# Patient Record
Sex: Female | Born: 1940 | Race: White | Hispanic: No | Marital: Married | Smoking: Never smoker
Health system: Southern US, Community
[De-identification: ages and names within clinical notes are randomized; demographics above are authoritative.]

## PROBLEM LIST (undated history)

## (undated) DIAGNOSIS — N183 Chronic kidney disease, stage 3 unspecified: Secondary | ICD-10-CM

## (undated) DIAGNOSIS — I639 Cerebral infarction, unspecified: Secondary | ICD-10-CM

## (undated) DIAGNOSIS — M1990 Unspecified osteoarthritis, unspecified site: Secondary | ICD-10-CM

## (undated) DIAGNOSIS — I00 Rheumatic fever without heart involvement: Secondary | ICD-10-CM

## (undated) DIAGNOSIS — L03119 Cellulitis of unspecified part of limb: Secondary | ICD-10-CM

## (undated) DIAGNOSIS — K759 Inflammatory liver disease, unspecified: Secondary | ICD-10-CM

## (undated) DIAGNOSIS — K219 Gastro-esophageal reflux disease without esophagitis: Secondary | ICD-10-CM

## (undated) DIAGNOSIS — E039 Hypothyroidism, unspecified: Secondary | ICD-10-CM

## (undated) DIAGNOSIS — I071 Rheumatic tricuspid insufficiency: Secondary | ICD-10-CM

## (undated) DIAGNOSIS — M797 Fibromyalgia: Secondary | ICD-10-CM

## (undated) DIAGNOSIS — D510 Vitamin B12 deficiency anemia due to intrinsic factor deficiency: Secondary | ICD-10-CM

## (undated) DIAGNOSIS — I2510 Atherosclerotic heart disease of native coronary artery without angina pectoris: Secondary | ICD-10-CM

## (undated) DIAGNOSIS — K254 Chronic or unspecified gastric ulcer with hemorrhage: Secondary | ICD-10-CM

## (undated) DIAGNOSIS — L02419 Cutaneous abscess of limb, unspecified: Secondary | ICD-10-CM

## (undated) DIAGNOSIS — I340 Nonrheumatic mitral (valve) insufficiency: Secondary | ICD-10-CM

## (undated) DIAGNOSIS — Z8679 Personal history of other diseases of the circulatory system: Secondary | ICD-10-CM

## (undated) DIAGNOSIS — J189 Pneumonia, unspecified organism: Secondary | ICD-10-CM

## (undated) DIAGNOSIS — Z95810 Presence of automatic (implantable) cardiac defibrillator: Secondary | ICD-10-CM

## (undated) DIAGNOSIS — I272 Pulmonary hypertension, unspecified: Secondary | ICD-10-CM

## (undated) DIAGNOSIS — C50919 Malignant neoplasm of unspecified site of unspecified female breast: Secondary | ICD-10-CM

## (undated) DIAGNOSIS — I4891 Unspecified atrial fibrillation: Secondary | ICD-10-CM

## (undated) DIAGNOSIS — I10 Essential (primary) hypertension: Secondary | ICD-10-CM

## (undated) DIAGNOSIS — IMO0002 Reserved for concepts with insufficient information to code with codable children: Secondary | ICD-10-CM

## (undated) HISTORY — DX: Chronic or unspecified gastric ulcer with hemorrhage: K25.4

## (undated) HISTORY — PX: TONSILLECTOMY: SUR1361

## (undated) HISTORY — DX: Atherosclerotic heart disease of native coronary artery without angina pectoris: I25.10

## (undated) HISTORY — DX: Essential (primary) hypertension: I10

## (undated) HISTORY — DX: Rheumatic tricuspid insufficiency: I07.1

## (undated) HISTORY — DX: Malignant neoplasm of unspecified site of unspecified female breast: C50.919

## (undated) HISTORY — PX: CARDIAC VALVE REPLACEMENT: SHX585

## (undated) HISTORY — DX: Rheumatic fever without heart involvement: I00

## (undated) HISTORY — DX: Cerebral infarction, unspecified: I63.9

## (undated) HISTORY — DX: Reserved for concepts with insufficient information to code with codable children: IMO0002

## (undated) HISTORY — PX: PARTIAL GASTRECTOMY: SHX2172

## (undated) HISTORY — DX: Pulmonary hypertension, unspecified: I27.20

## (undated) HISTORY — DX: Nonrheumatic mitral (valve) insufficiency: I34.0

---

## 1977-11-11 DIAGNOSIS — D510 Vitamin B12 deficiency anemia due to intrinsic factor deficiency: Secondary | ICD-10-CM

## 1977-11-11 HISTORY — DX: Vitamin B12 deficiency anemia due to intrinsic factor deficiency: D51.0

## 2006-11-11 HISTORY — PX: MASTECTOMY: SHX3

## 2009-02-08 ENCOUNTER — Encounter: Payer: Self-pay | Admitting: Cardiology

## 2009-03-02 ENCOUNTER — Ambulatory Visit: Payer: Self-pay | Admitting: Cardiology

## 2009-03-06 ENCOUNTER — Ambulatory Visit: Payer: Self-pay | Admitting: Cardiology

## 2009-03-10 ENCOUNTER — Ambulatory Visit: Payer: Self-pay | Admitting: Cardiovascular Disease

## 2009-03-10 ENCOUNTER — Ambulatory Visit: Payer: Self-pay | Admitting: Pulmonary Disease

## 2009-03-10 ENCOUNTER — Inpatient Hospital Stay (HOSPITAL_COMMUNITY): Admission: AD | Admit: 2009-03-10 | Discharge: 2009-03-13 | Payer: Self-pay | Admitting: Cardiovascular Disease

## 2009-03-10 ENCOUNTER — Inpatient Hospital Stay (HOSPITAL_BASED_OUTPATIENT_CLINIC_OR_DEPARTMENT_OTHER): Admission: RE | Admit: 2009-03-10 | Discharge: 2009-03-10 | Payer: Self-pay | Admitting: Cardiology

## 2009-03-11 ENCOUNTER — Encounter: Payer: Self-pay | Admitting: Cardiology

## 2009-03-13 ENCOUNTER — Encounter: Payer: Self-pay | Admitting: Cardiology

## 2009-03-21 ENCOUNTER — Ambulatory Visit: Payer: Self-pay | Admitting: Emergency Medicine

## 2009-03-21 DIAGNOSIS — I00 Rheumatic fever without heart involvement: Secondary | ICD-10-CM | POA: Insufficient documentation

## 2009-03-21 DIAGNOSIS — K250 Acute gastric ulcer with hemorrhage: Secondary | ICD-10-CM | POA: Insufficient documentation

## 2009-03-21 DIAGNOSIS — C801 Malignant (primary) neoplasm, unspecified: Secondary | ICD-10-CM

## 2009-03-21 DIAGNOSIS — C50919 Malignant neoplasm of unspecified site of unspecified female breast: Secondary | ICD-10-CM | POA: Insufficient documentation

## 2009-03-21 DIAGNOSIS — I272 Pulmonary hypertension, unspecified: Secondary | ICD-10-CM

## 2009-04-03 ENCOUNTER — Ambulatory Visit: Payer: Self-pay | Admitting: Cardiology

## 2009-04-04 ENCOUNTER — Encounter: Payer: Self-pay | Admitting: Cardiology

## 2009-04-05 ENCOUNTER — Encounter: Payer: Self-pay | Admitting: Emergency Medicine

## 2009-04-24 ENCOUNTER — Encounter: Payer: Self-pay | Admitting: Emergency Medicine

## 2009-04-26 ENCOUNTER — Ambulatory Visit: Payer: Self-pay | Admitting: Emergency Medicine

## 2009-04-26 DIAGNOSIS — R0602 Shortness of breath: Secondary | ICD-10-CM

## 2009-04-26 DIAGNOSIS — J45909 Unspecified asthma, uncomplicated: Secondary | ICD-10-CM | POA: Insufficient documentation

## 2009-05-01 ENCOUNTER — Encounter: Payer: Self-pay | Admitting: Emergency Medicine

## 2009-05-11 ENCOUNTER — Ambulatory Visit: Payer: Self-pay | Admitting: Emergency Medicine

## 2009-05-11 DIAGNOSIS — R930 Abnormal findings on diagnostic imaging of skull and head, not elsewhere classified: Secondary | ICD-10-CM | POA: Insufficient documentation

## 2009-06-02 ENCOUNTER — Encounter: Payer: Self-pay | Admitting: Emergency Medicine

## 2009-06-21 ENCOUNTER — Ambulatory Visit: Payer: Self-pay | Admitting: Emergency Medicine

## 2009-07-07 ENCOUNTER — Encounter: Payer: Self-pay | Admitting: Emergency Medicine

## 2009-08-07 ENCOUNTER — Ambulatory Visit: Payer: Self-pay | Admitting: Emergency Medicine

## 2009-09-18 ENCOUNTER — Ambulatory Visit: Payer: Self-pay | Admitting: Emergency Medicine

## 2009-09-19 ENCOUNTER — Encounter: Payer: Self-pay | Admitting: Emergency Medicine

## 2009-09-19 ENCOUNTER — Ambulatory Visit: Payer: Self-pay | Admitting: Cardiology

## 2009-10-16 ENCOUNTER — Ambulatory Visit: Payer: Self-pay | Admitting: Emergency Medicine

## 2009-10-16 DIAGNOSIS — J309 Allergic rhinitis, unspecified: Secondary | ICD-10-CM | POA: Insufficient documentation

## 2009-12-02 ENCOUNTER — Encounter: Payer: Self-pay | Admitting: Emergency Medicine

## 2010-01-24 ENCOUNTER — Ambulatory Visit: Payer: Self-pay | Admitting: Emergency Medicine

## 2010-02-14 ENCOUNTER — Encounter: Payer: Self-pay | Admitting: Emergency Medicine

## 2010-02-28 ENCOUNTER — Encounter: Payer: Self-pay | Admitting: Emergency Medicine

## 2010-04-19 ENCOUNTER — Encounter: Payer: Self-pay | Admitting: Emergency Medicine

## 2010-04-30 ENCOUNTER — Ambulatory Visit: Payer: Self-pay | Admitting: Emergency Medicine

## 2010-05-16 ENCOUNTER — Ambulatory Visit: Payer: Self-pay | Admitting: Cardiology

## 2010-05-16 DIAGNOSIS — I052 Rheumatic mitral stenosis with insufficiency: Secondary | ICD-10-CM

## 2010-05-16 DIAGNOSIS — I2510 Atherosclerotic heart disease of native coronary artery without angina pectoris: Secondary | ICD-10-CM | POA: Insufficient documentation

## 2010-06-12 ENCOUNTER — Ambulatory Visit: Payer: Self-pay | Admitting: Emergency Medicine

## 2010-06-20 ENCOUNTER — Telehealth (INDEPENDENT_AMBULATORY_CARE_PROVIDER_SITE_OTHER): Payer: Self-pay | Admitting: *Deleted

## 2010-07-04 ENCOUNTER — Telehealth (INDEPENDENT_AMBULATORY_CARE_PROVIDER_SITE_OTHER): Payer: Self-pay | Admitting: *Deleted

## 2010-08-29 ENCOUNTER — Telehealth: Payer: Self-pay | Admitting: Emergency Medicine

## 2010-09-03 ENCOUNTER — Encounter: Payer: Self-pay | Admitting: Emergency Medicine

## 2010-09-20 ENCOUNTER — Ambulatory Visit: Payer: Self-pay | Admitting: Emergency Medicine

## 2010-09-24 ENCOUNTER — Telehealth (INDEPENDENT_AMBULATORY_CARE_PROVIDER_SITE_OTHER): Payer: Self-pay | Admitting: *Deleted

## 2010-09-27 ENCOUNTER — Encounter: Payer: Self-pay | Admitting: Emergency Medicine

## 2010-09-29 ENCOUNTER — Encounter: Payer: Self-pay | Admitting: Emergency Medicine

## 2010-10-16 ENCOUNTER — Ambulatory Visit: Payer: Self-pay | Admitting: Emergency Medicine

## 2010-10-24 ENCOUNTER — Telehealth (INDEPENDENT_AMBULATORY_CARE_PROVIDER_SITE_OTHER): Payer: Self-pay | Admitting: *Deleted

## 2010-10-25 ENCOUNTER — Ambulatory Visit: Payer: Self-pay | Admitting: Emergency Medicine

## 2010-11-01 ENCOUNTER — Encounter: Payer: Self-pay | Admitting: Emergency Medicine

## 2010-12-02 ENCOUNTER — Encounter: Payer: Self-pay | Admitting: Emergency Medicine

## 2010-12-11 NOTE — Progress Notes (Signed)
Summary: need to be recertified with oxygen  Phone Note Call from Patient Call back at [phone] - cell   Caller: Patient Call For: byrum Summary of Call: need to talk to nurse about oxygen test Initial call taken by: Rickard Patience,  August 29, 2010 10:53 AM  Follow-up for Phone Call        Pt states she was told by her DME that she needs to be requalified for her oxygen. She needs to be checked on oxygen and then off and this needs to be documented. Pt states this documentation as well as a new RX for oxygen needs to be sent to DME after visit. Pt schedueld to see TP on friday 08-31-10. Carron Curie CMA  August 29, 2010 12:24 PM

## 2010-12-11 NOTE — Assessment & Plan Note (Signed)
Summary: pulm HTN   Visit Type:  Follow-up Primary Provider/Referring Provider:  Dr. Baltazar Najjar in [place], [place]  CC:  [facility]. The patient c/o increased sob and chest tightness when walking. The patient says Dr. Darius Bump wants to know why she was taken off the Qvar.Marland Kitchen  History of Present Illness: 70 year old female admitted 03/10/09 for increased dsypnea, chest pain, underwent card cath on 03/10/09 that showed significant Pulmonary HTN w/ PAP-86/29 , wedge of 21-23. Pulmonary/CCM consulted in hospital. Pt was discharged on continuous O2 due to persistent hypoxia. She is a never smoker, carries dx of asthma for several years. See prior notes for autoimmune w/u. Has also had an abnormal CT scan, heterogeneous GGI.   ROV 10/16/09 -- returns for regular f/u. has been on Revatio for a few months now. She believes that she has more stamina, can do more. She is reliable about using her O2. For last few days she has been hearing wheeze with exertion. More nasal drainage, blows out mucous and some blood in the am from her nose. Has been on loratadine without relief.  ROV 01/22/10 -- continues to have exertional SOB. Feels overall better on the Revatio. Uses maxair fairly rarely.   Current Medications (verified): 1)  Klor-Con 10 10 Meq Cr-Tabs (Potassium Chloride) .... 2 Tablets in The Mornings, and 1 Tablet in The Evening 2)  Simvastatin 80 Mg Tabs (Simvastatin) .... Take 1 Tab By Mouth At Bedtime 3)  Centrum Silver  Tabs (Multiple Vitamins-Minerals) .... Take 1 Tablet By Mouth Once A Day 4)  Nitroglycerin 0.4 Mg Subl (Nitroglycerin) .Marland Kitchen.. 1 Under Tongue Every 5 Minutes X3 As Needed Chest Pain 5)  Femara 2.5 Mg Tabs (Letrozole) .... Take 1 Tablet By Mouth Once A Day 6)  Hydrochlorothiazide 25 Mg Tabs (Hydrochlorothiazide) .... Take 1 Tablet By Mouth Once A Day 7)  Bisoprolol Fumarate 5 Mg Tabs (Bisoprolol Fumarate) .... Take 1 Tablet By Mouth Once A Day 8)  Bayer Low Strength 81 Mg Tbec (Aspirin) ....  Take 1 Tablet By Mouth Once A Day 9)  Amitriptyline Hcl 75 Mg Tabs (Amitriptyline Hcl) .... 2 Tablets By Mouth At Bedtime 10)  Ambien 10 Mg Tabs (Zolpidem Tartrate) .... Take 1 Tab By Mouth At Bedtime 11)  Bumetanide 1 Mg Tabs (Bumetanide) .... Take 1 Tablet By Mouth Once A Day As Needed 12)  Levothyroxine Sodium 112 Mcg Tabs (Levothyroxine Sodium) .... Take 1 Tablet By Mouth Once A Day 13)  Cyanocobalamin 1000 Mcg/ml Soln (Cyanocobalamin) .Marland Kitchen.. 1 Injection Every Month 14)  Glucophage Xr 500 Mg Xr24h-Tab (Metformin Hcl) .... Take 1 Tablet By Mouth Every Morning and 2 Every Evening 15)  Hydrocodone-Acetaminophen 7.5-650 Mg Tabs (Hydrocodone-Acetaminophen) .... Take 1 Tablet By Mouth Three Times A Day As Needed 16)  Diovan 160 Mg Tabs (Valsartan) .... Take 1 Tablet By Mouth Once A Day 17)  Maxair Autohaler 200 Mcg/inh Aerb (Pirbuterol Acetate) .... Inhale 2 Puffs Every Four Hours As Needed 18)  Calcium Carbonate-Vitamin D 600-400 Mg-Unit  Tabs (Calcium Carbonate-Vitamin D) .... Take 1 Tablet By Mouth Two Times A Day 19)  Folic Acid 1 Mg Tabs (Folic Acid) .... Take 2 Tablet By Mouth Once A Day 20)  Claritin 10 Mg Tabs (Loratadine) .... Take 1 Tablet By Mouth Once A Day 21)  Revatio 20 Mg Tabs (Sildenafil Citrate) .Marland Kitchen.. 1 By Mouth Two Times A Day 22)  Nexium 40 Mg Cpdr (Esomeprazole Magnesium) .Marland Kitchen.. 1 By Mouth Before Supper  Allergies (verified): 1)  ! *  Stadol  Vital Signs:  Patient profile:   70 year old female Height:      63 inches Weight:      181 pounds BMI:     32.18 O2 Sat:      98 % on 2 L/min Temp:     98.4 degrees F oral Pulse rate:   74 / minute BP sitting:   112 / 72  (right arm)  Vitals Entered By: Michel Bickers CMA (January 24, 2010 4:58 PM)  O2 Sat at Rest %:  98 O2 Flow:  2 L/min  Physical Exam  Additional Exam:  GEN: A/Ox3; pleasant , NAD HEENT:  [place]/AT, , EACs-clear, TMs-wnl, NOSE-clear, THROAT-clear NECK:  Supple w/ fair ROM; no JVD; normal carotid impulses w/o bruits;  no thyromegaly or nodules palpated; no lymphadenopathy. RESP  Mostly clear, no wheezes or crackles CARD:  RRR, no m/r/g   GI:   Soft & nt; nml bowel sounds; no organomegaly or masses detected. Musco: Warm bil,  no calf tenderness edema, clubbing, pulses intact Neuro:  intact alert and oriented x 3    Impression & Recommendations:  Problem # 1:  PULMONARY HYPERTENSION (ICD-416.8) We will continue your oxygen at all times Continue your Revatio three times a day  6 minute walk next visit. Echocardiogram in 3 months.  Follow up with Dr Delton Coombes in 3 months or as needed.   Problem # 2:  INTRINSIC ASTHMA, UNSPECIFIED (ICD-493.10) Use your maxair as needed.  We will not restart QVAR at this time.   Medications Added to Medication List This Visit: 1)  Nexium 40 Mg Cpdr (Esomeprazole magnesium) .Marland Kitchen.. 1 by mouth before supper  Other Orders: Est. Patient Level IV (09811)  Patient Instructions: 1)  We will continue your oxygen at all times. 2)  Use your maxair as needed.  3)  Continue your Revatio three times a day  4)  We will not restart QVAR at this time.  5)  6 minute walk next visit. 6)  Echocardiogram in 3 months.  7)  Follow up with Dr Delton Coombes in 3 months or as needed.

## 2010-12-11 NOTE — Assessment & Plan Note (Signed)
Summary: 1 YR FU   Visit Type:  Follow-up Referring Provider:  Dr. Levy Pupa Primary Provider:  Dr. Baltazar Najjar   History of Present Illness: 70 year old woman presents for followup. She was last seen in our office back in May of 2010. We assisted in evaluation of documented severe pulmonary hypertension by arranging a right and left heart catheterization last year that documented only mild nonobstructive CAD with a pulmonary artery systolic pressure of 86. She also has valvular heart disease, involving both mitral and tricuspid, as detailed below.  She has been followed by Dr. Delton Coombes in our Pulmonary division since that time. He has been managing her medical therapy and also following subsequent CT scans of the chest. She had been on Revatio, although discontinued the medication with concerns about side effects, also reporting increased shortness of breath and lower extremity edema. She states that she is due to see Dr. Delton Coombes back within the month for reassessment, to discuss resuming the medicine, and at a later date to proceed with a 6 minute walk and followup echocardiogram.  She denies any significant exertional chest pain. She denies any palpitations or syncope. Her electrocardiogram from today is reviewed below.  Preventive Screening-Counseling & Management  Alcohol-Tobacco     Smoking Status: never  Current Medications (verified): 1)  Klor-Con 10 10 Meq Cr-Tabs (Potassium Chloride) .... 2 Tablets in The Mornings, and 1 Tablet in The Evening 2)  Simvastatin 80 Mg Tabs (Simvastatin) .... Take 1 Tab By Mouth At Bedtime 3)  Centrum Silver  Tabs (Multiple Vitamins-Minerals) .... Take 1 Tablet By Mouth Once A Day 4)  Femara 2.5 Mg Tabs (Letrozole) .... Take 1 Tablet By Mouth Once A Day 5)  Hydrochlorothiazide 25 Mg Tabs (Hydrochlorothiazide) .... Take 1 Tablet By Mouth Once A Day 6)  Bisoprolol Fumarate 5 Mg Tabs (Bisoprolol Fumarate) .... Take 1 Tablet By Mouth Once A Day 7)   Bayer Low Strength 81 Mg Tbec (Aspirin) .... Take 1 Tablet By Mouth Once A Day 8)  Amitriptyline Hcl 75 Mg Tabs (Amitriptyline Hcl) .... 2 Tablets By Mouth At Bedtime 9)  Ambien 10 Mg Tabs (Zolpidem Tartrate) .... Take 1 Tab By Mouth At Bedtime 10)  Bumetanide 1 Mg Tabs (Bumetanide) .... Take 1 Tablet By Mouth Once A Day As Needed 11)  Levothyroxine Sodium 112 Mcg Tabs (Levothyroxine Sodium) .... Take 1 Tablet By Mouth Once A Day 12)  Cyanocobalamin 1000 Mcg/ml Soln (Cyanocobalamin) .Marland Kitchen.. 1 Injection Every Month 13)  Glucophage Xr 500 Mg Xr24h-Tab (Metformin Hcl) .... Take 1 Tablet By Mouth Every Morning and 2 Every Evening 14)  Hydrocodone-Acetaminophen 7.5-650 Mg Tabs (Hydrocodone-Acetaminophen) .... Take 1 Tablet By Mouth Three Times A Day As Needed 15)  Diovan 160 Mg Tabs (Valsartan) .... Take 1 Tablet By Mouth Once A Day 16)  Maxair Autohaler 200 Mcg/inh Aerb (Pirbuterol Acetate) .... Inhale 2 Puffs Every Four Hours As Needed 17)  Calcium Carbonate-Vitamin D 600-400 Mg-Unit  Tabs (Calcium Carbonate-Vitamin D) .... Take 1 Tablet By Mouth Two Times A Day 18)  Folic Acid 1 Mg Tabs (Folic Acid) .... Take 2 Tablet By Mouth Once A Day 19)  Revatio 20 Mg Tabs (Sildenafil Citrate) .Marland Kitchen.. 1 By Mouth Two Times A Day 20)  Nexium 40 Mg Cpdr (Esomeprazole Magnesium) .Marland Kitchen.. 1 By Mouth Before Supper 21)  Benadryl 25 Mg Tabs (Diphenhydramine Hcl) .Marland Kitchen.. 1-2 By Mouth At Bedtime  Allergies (verified): 1)  ! * Stadol  Comments:  Nurse/Medical Assistant: The patient's medication list  and allergies were reviewed with the patient and were updated in the Medication and Allergy Lists.  Past History:  Past Medical History: Last updated: 03/21/2009 PULMONARY HYPERTENSION (ICD-416.8)--s/p right heart cath 03/10/09- PAP 86/29, wedge 21-23.    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 s/p mastectomy-L s/p 9 lymph node resection. Dr. Darylene [facility]   CORONARY ARTERY DISEASE (ICD-414.00)-- cath 03/10/09 [facility] cards-nonobstrucitve Dz , Echo showed LVF at 55%, mild to mod MR, TR.   1. Pulmonary hypertension with pulmonary artery pressures of 86/29,     and a wedge pressure of 21-23, and right heart catheterization this     admission. 2. Nonobstructive coronary artery disease with cardiac catheterization     this admission showing 30% left anterior descending, 10-20% in the     circumflex right coronary artery with no disease. 3. Preserved left ventricular function with an ejection fraction of     55% at catheterization, and mild-to-moderate mitral regurgitation     with mild-to-moderate tricuspid regurgitation noted as well. 4. Status post CT angiogram of the chest this admission showing     diffuse ground-glass opacifications possibly fibrosis and diffuse     mediastinal lymphadenopathy with index lymph node in the right     pretracheal space measuring 1.4 cm. 5. Hypokalemia. 6. History of breast cancer in 2008. 7. History of rheumatic fever as a child. 8. Allergy or intolerance to STADOL. 9. History of gastric ulcer with hemorrhage and partial gastrectomy. 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   Social History: Last updated: 05/16/2010 Married 2 children High school education Not working at this time Never smoked No alcohol  Social History: Married 2 children High school education Not working at this time Never smoked No alcohol  Review of Systems       The patient complains of dyspnea on exertion and peripheral edema.  The patient denies anorexia, fever, chest pain, syncope, prolonged cough, hemoptysis, melena, and hematochezia.         Otherwise reviewed and negative.  Vital Signs:  Patient profile:   70 year old female Height:      63  inches Weight:      182 pounds Pulse rate:   84 / minute BP sitting:   143 / 82  (right arm) Cuff size:   regular  Vitals Entered By: Carlye Grippe (May 16, 2010 9:57 AM)  Physical Exam  Additional Exam:  Overweight woman in no acute distress wearing oxygen. HEENT: Conjunctiva and lids normal, oropharynx with moist mucosa. Neck: Supple, no elevated JVP or bruits. Lungs: Clear to auscultation, diminished but nonlabored. No wheezes. Cardiac: Regular rate and rhythm, prominent P2, 2/6 systolic murmur at the apex, no S3. Abdomen: Protuberant, nontender, bowel sounds present. Extremities: Erythema distally in pattern of venous stasis with 1+ edema below the knees bilaterally, distal pulses one plus. Skin: Warm and dry. Musculoskeletal: No kyphosis. Neuropsychiatric: Alert oriented x3, and grossly appropriate.   Arterial Doppler  Procedure date:  04/04/2009  Findings:      Lower extremity Doppler studies obtained to assess for pseudoaneurysm following catherterization, and found to be negative bilaterally.  Echocardiogram  Procedure date:  03/06/2009  Findings:      LVEF 60-65%  with grade 1 diastolic dysfunction, moderate LAE, thickened mitral valve with somewhat restricted posterior leaflet associated with equivocal anterior leaflet prolapse, moderate eccentric mitral regurgitation, trivial aortic regurgitation, moderate to severe tricuspid regurgitation, RVSP 100 mmHg, dilated IVC with blunted respiratory is exchange.  EKG  Procedure date:  05/16/2010  Findings:      Sinus rhythm with prolonged PR interval of 210 ms, leftward axis, increased voltage, QTC 486 ms.  Impression & Recommendations:  Problem # 1:  PULMONARY HYPERTENSION (ICD-416.8)  Followed by Dr. Delton Coombes. Patient states that she has a visit within the month to discuss possibly resuming Revatio.  Once she is back on the medication, my understanding is that she will be scheduled for a 6 minute walk test, and at  that time can also have a followup 2-D echocardiogram. This study could be obtained at [facility] for our review.  Problem # 2:  CORONARY ATHEROSCLEROSIS NATIVE CORONARY ARTERY (ICD-414.01)  Mild, nonobstructive CAD documented at cardiac catheterization last year. No reported angina. Patient continues on aspirin with risk modification strategies under the direction of Dr. Darius Bump.  The following medications were removed from the medication list:    Nitroglycerin 0.4 Mg Subl (Nitroglycerin) .Marland Kitchen... 1 under tongue every 5 minutes x3 as needed chest pain Her updated medication list for this problem includes:    Bisoprolol Fumarate 5 Mg Tabs (Bisoprolol fumarate) .Marland Kitchen... Take 1 tablet by mouth once a day    Bayer Low Strength 81 Mg Tbec (Aspirin) .Marland Kitchen... 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 Patient is due for a followup echocardiogram, and this can be obtained after she is back on Revatio, for concurrent assessment of pulmonary pressures.  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 This can be reassessed as noted above as well.  The following medications were removed from the medication list:    Nitroglycerin 0.4 Mg Subl (Nitroglycerin) .Marland Kitchen... 1 under tongue every 5 minutes x3 as needed chest pain Her updated medication list for this problem includes:    Hydrochlorothiazide 25 Mg Tabs (Hydrochlorothiazide) .Marland Kitchen... Take 1 tablet by mouth once a day    Bisoprolol Fumarate 5 Mg Tabs (Bisoprolol fumarate) .Marland Kitchen... Take 1 tablet by mouth once a day    Bumetanide 1 Mg  Tabs (Bumetanide) .Marland Kitchen... Take 1 tablet by mouth once a day as needed    Diovan 160 Mg Tabs (Valsartan) .Marland Kitchen... Take 1 tablet by mouth once a day  Other Orders: EKG w/ Interpretation (93000)  Patient Instructions: 1)  Your physician wants you to follow-up in: 6 months. You will receive a reminder letter in the mail one-two months in advance. If you don't receive a letter, please call our office to schedule the follow-up appointment. 2)  Your physician recommends that you continue on your current medications as directed. Please refer to the Current Medication list given to you today.

## 2010-12-11 NOTE — Procedures (Signed)
Summary: Oximetry / [facility]  Oximetry / [facility]   Imported By: Lennie Odor 10/18/2010 16:08:04  _____________________________________________________________________  External Attachment:    Type:   Image     Comment:   External Document

## 2010-12-11 NOTE — Assessment & Plan Note (Signed)
Summary: pulm HTN, abnormal CT scan   Visit Type:  Follow-up Primary Provider/Referring Provider:  Dr. Baltazar Najjar in [place], [place]  CC:  [facility]. Asthma.  The patient has been out of her Revatio for 1 week and says her breathig  and lower leg edema has improved.Marland KitchenMarland KitchenShe does c/o a runny nose with all the time with the oxygen use.Marland KitchenMarland KitchenShe has not had a repeat Echo or six minute walk.Marland KitchenMarland KitchenShe will see Dr. Diona Browner in the [place] office on 05/16/2010.Marland Kitchen  History of Present Illness: 70 year old female admitted 03/10/09 for increased dsypnea, chest pain, underwent card cath on 03/10/09 that showed significant Pulmonary HTN w/ PAP-86/29 , wedge of 21-23. Pulmonary/CCM consulted in hospital. Pt was discharged on continuous O2 due to persistent hypoxia. She is a never smoker, carries dx of asthma for several years. See prior notes for autoimmune w/u. Has also had an abnormal CT scan, heterogeneous GGI.   ROV 10/16/09 -- returns for regular f/u. has been on Revatio for a few months now. She believes that she has more stamina, can do more. She is reliable about using her O2. For last few days she has been hearing wheeze with exertion. More nasal drainage, blows out mucous and some blood in the am from her nose. Has been on loratadine without relief.  ROV 01/22/10 -- continues to have exertional SOB. Feels overall better on the Revatio. Uses maxair fairly rarely.   ROV 04/30/10 -- Returns for her pulm HTN. She tells me that she ran out of Revatio about 10 days ago because her paperwork lapsed - she hasn't done her taxes yet, which appears to be the barrier. In retrospect she tells me that she believes that her breathing has been a bit better off the Revatio. She also feels that her swelling and her dizziness are better off the med. She also feels that her mood is improved as well. last CT scan of the chest to evaluate her was 11/10.   Current Medications (verified): 1)  Klor-Con 10 10 Meq Cr-Tabs (Potassium Chloride) ....  2 Tablets in The Mornings, and 1 Tablet in The Evening 2)  Simvastatin 80 Mg Tabs (Simvastatin) .... Take 1 Tab By Mouth At Bedtime 3)  Centrum Silver  Tabs (Multiple Vitamins-Minerals) .... Take 1 Tablet By Mouth Once A Day 4)  Nitroglycerin 0.4 Mg Subl (Nitroglycerin) .Marland Kitchen.. 1 Under Tongue Every 5 Minutes X3 As Needed Chest Pain 5)  Femara 2.5 Mg Tabs (Letrozole) .... Take 1 Tablet By Mouth Once A Day 6)  Hydrochlorothiazide 25 Mg Tabs (Hydrochlorothiazide) .... Take 1 Tablet By Mouth Once A Day 7)  Bisoprolol Fumarate 5 Mg Tabs (Bisoprolol Fumarate) .... Take 1 Tablet By Mouth Once A Day 8)  Bayer Low Strength 81 Mg Tbec (Aspirin) .... Take 1 Tablet By Mouth Once A Day 9)  Amitriptyline Hcl 75 Mg Tabs (Amitriptyline Hcl) .... 2 Tablets By Mouth At Bedtime 10)  Ambien 10 Mg Tabs (Zolpidem Tartrate) .... Take 1 Tab By Mouth At Bedtime 11)  Bumetanide 1 Mg Tabs (Bumetanide) .... Take 1 Tablet By Mouth Once A Day As Needed 12)  Levothyroxine Sodium 112 Mcg Tabs (Levothyroxine Sodium) .... Take 1 Tablet By Mouth Once A Day 13)  Cyanocobalamin 1000 Mcg/ml Soln (Cyanocobalamin) .Marland Kitchen.. 1 Injection Every Month 14)  Glucophage Xr 500 Mg Xr24h-Tab (Metformin Hcl) .... Take 1 Tablet By Mouth Every Morning and 2 Every Evening 15)  Hydrocodone-Acetaminophen 7.5-650 Mg Tabs (Hydrocodone-Acetaminophen) .... Take 1 Tablet By Mouth Three Times A  Day As Needed 16)  Diovan 160 Mg Tabs (Valsartan) .... Take 1 Tablet By Mouth Once A Day 17)  Maxair Autohaler 200 Mcg/inh Aerb (Pirbuterol Acetate) .... Inhale 2 Puffs Every Four Hours As Needed 18)  Calcium Carbonate-Vitamin D 600-400 Mg-Unit  Tabs (Calcium Carbonate-Vitamin D) .... Take 1 Tablet By Mouth Two Times A Day 19)  Folic Acid 1 Mg Tabs (Folic Acid) .... Take 2 Tablet By Mouth Once A Day 20)  Claritin 10 Mg Tabs (Loratadine) .... Take 1 Tablet By Mouth Once A Day 21)  Revatio 20 Mg Tabs (Sildenafil Citrate) .Marland Kitchen.. 1 By Mouth Two Times A Day 22)  Nexium 40 Mg Cpdr  (Esomeprazole Magnesium) .Marland Kitchen.. 1 By Mouth Before Supper 23)  Benadryl 25 Mg Tabs (Diphenhydramine Hcl) .Marland Kitchen.. 1-2 By Mouth At Bedtime  Allergies (verified): 1)  ! * Stadol  Vital Signs:  Patient profile:   70 year old female Height:      63 inches (160.02 cm) Weight:      178 pounds (80.91 kg) BMI:     31.65 O2 Sat:      96 % on 2 L/min Temp:     98.4 degrees F (36.89 degrees C) oral Pulse rate:   72 / minute BP sitting:   136 / 80  (right arm) Cuff size:   regular  Vitals Entered By: Michel Bickers CMA (April 30, 2010 4:38 PM)  O2 Sat at Rest %:  96 O2 Flow:  2 L/min CC: PAH. Asthma.  The patient has been out of her Revatio for 1 week and says her breathig  and lower leg edema has improved.Marland KitchenMarland KitchenShe does c/o a runny nose with all the time with the oxygen use.Marland KitchenMarland KitchenShe has not had a repeat Echo or six minute walk.Marland KitchenMarland KitchenShe will see Dr. Diona Browner in the [place] office on 05/16/2010. Comments Medications reviewed. Daytime phone verified. Michel Bickers CMA  April 30, 2010 4:39 PM   Physical Exam  Additional Exam:  GEN: A/Ox3; pleasant , NAD HEENT:  [place]/AT, , EACs-clear, TMs-wnl, NOSE-clear, THROAT-clear NECK:  Supple w/ fair ROM; no JVD; normal carotid impulses w/o bruits; no thyromegaly or nodules palpated; no lymphadenopathy. RESP  Mostly clear, no wheezes or crackles CARD:  RRR, no m/r/g   GI:   Soft & nt; nml bowel sounds; no organomegaly or masses detected. [place]: Warm bil,  no calf tenderness edema, clubbing, pulses intact Neuro:  intact alert and oriented x 3    Impression & Recommendations:  Problem # 1:  PULMONARY HYPERTENSION (ICD-416.8) In setting interstitial changes on prior CTscans and PAOP > 20. We started Revatio with initial good response although she did have side effects including dizziness. Now has been off med for 10 days and feels better without it.  - will continue to hold the Revatio to better assess whether she ends up missing it  - continue O2 with all exertion  - Defer 6  minute walk and TTE for now since she is off meds - ROV in 1 month to assess clinical status off the Revatio  Problem # 2:  CT, CHEST, ABNORMAL (ICD-793.1) Last Ct scan was in 11/10, will likely repeat in 11/11, can discuss next visit  Medications Added to Medication List This Visit: 1)  Benadryl 25 Mg Tabs (Diphenhydramine hcl) .Marland Kitchen.. 1-2 by mouth at bedtime  Other Orders: Est. Patient Level IV (16109)  Patient Instructions: 1)  Do not restart Revatio at this time.  2)  Keep track of your symptoms for the next  2 weeks, then we will decide whether to restart the medication.  3)  We will not perform a 6 minute walk or an echocardiogram at this time since you are off the medication.  4)  Continue your oxygen as you are using it.  5)  You need a repeat CTscan of the chest in November.  6)  Follow up with Dr Diona Browner as planned in July 7)  Follow up in 1 month with Dr Delton Coombes

## 2010-12-11 NOTE — Assessment & Plan Note (Signed)
Summary: PAH   Visit Type:  Follow-up Copy to:  Dr. Levy Pupa Primary Belen Pesch/Referring Mylea Roarty:  Dr. Baltazar Najjar  CC:  [facility] and CT chest follow-up...pt says she is doing well...not able to go without oxygen.  History of Present Illness: 70 year old female admitted 03/10/09 for increased dsypnea, chest pain, underwent card cath on 03/10/09 that showed significant Pulmonary HTN w/ PAP-86/29 , wedge of 21-23. Pulmonary/CCM consulted in hospital. Pt was discharged on continuous O2 due to persistent hypoxia. She is a never smoker, carries dx of asthma for several years. See prior notes for autoimmune w/u. Has also had an abnormal CT scan, heterogeneous GGI.   ROV 01/22/10 -- continues to have exertional SOB. Feels overall better on the Revatio. Uses maxair fairly rarely.   ROV 04/30/10 -- Returns for her pulm HTN. She tells me that she ran out of Revatio about 10 days ago because her paperwork lapsed - she hasn't done her taxes yet, which appears to be the barrier. In retrospect she tells me that she believes that her breathing has been a bit better off the Revatio. She also feels that her swelling and her dizziness are better off the med. She also feels that her mood is improved as well. last CT scan of the chest to evaluate her was 11/10.   ROV 06/12/10 -- PAH, GGI on CT scan in setting elevated PAOP, ? asthma. Uses HCTZ once daily, bumex as needed (not requiring at this time, since d/c of revatio). Breathing is about the same even off revatio. Dizziness is better of revatio. Still with lots of PND, a bit better on benadryl.   ROV 10/16/10 -- returns for f/u of her hypoxemia, PAH. We had also followed CT scan with patchy GG infiltrates, areas of atx/emphysematous change. Tells me her last TTE was in June in [place], I don't have that information at this time. Repeat CT scan done 09/28/10 - improvement in B focal area of inflammation. Last time she walked and did not desat - she feels that she still  needs O2 due to sob. ONO showed no desaturations 11/11. Uses Maxair 3x a week.   Current Medications (verified): 1)  Klor-Con 10 10 Meq Cr-Tabs (Potassium Chloride) .... 2 Tablets in The Mornings, and 1 Tablet in The Evening 2)  Crestor---Unsure of Mg? Marland Kitchen... Take 1 Tab By Mouth At Bedtime 3)  Centrum Silver  Tabs (Multiple Vitamins-Minerals) .... Take 1 Tablet By Mouth Once A Day 4)  Femara 2.5 Mg Tabs (Letrozole) .... Take 1 Tablet By Mouth Once A Day 5)  Hydrochlorothiazide 25 Mg Tabs (Hydrochlorothiazide) .... Take 1 Tablet By Mouth Once A Day 6)  Bisoprolol Fumarate 5 Mg Tabs (Bisoprolol Fumarate) .... Take 1 Tablet By Mouth Once A Day 7)  Bayer Low Strength 81 Mg Tbec (Aspirin) .... Take 1 Tablet By Mouth Once A Day 8)  Amitriptyline Hcl 75 Mg Tabs (Amitriptyline Hcl) .... 2 Tablets By Mouth At Bedtime 9)  Ambien 10 Mg Tabs (Zolpidem Tartrate) .... Take 1 Tab By Mouth At Bedtime 10)  Levothyroxine Sodium 137 Mcg Tabs (Levothyroxine Sodium) .Marland Kitchen.. 1 By Mouth Daily 11)  Cyanocobalamin 1000 Mcg/ml Soln (Cyanocobalamin) .Marland Kitchen.. 1 Injection Every Month 12)  Glucophage Xr 500 Mg Xr24h-Tab (Metformin Hcl) .... Take 1 Tablet By Mouth Every Morning and 2 Every Evening 13)  Hydrocodone-Acetaminophen 7.5-650 Mg Tabs (Hydrocodone-Acetaminophen) .... Take 1 Tablet By Mouth Three Times A Day As Needed 14)  Diovan 160 Mg Tabs (Valsartan) .... Take  1 Tablet By Mouth Once A Day 15)  Maxair Autohaler 200 Mcg/inh Aerb (Pirbuterol Acetate) .... Inhale 2 Puffs Every Four Hours As Needed 16)  Calcium Carbonate-Vitamin D 600-400 Mg-Unit  Tabs (Calcium Carbonate-Vitamin D) .... Take 1 Tablet By Mouth Two Times A Day 17)  Folic Acid 1 Mg Tabs (Folic Acid) .... Take 2 Tablet By Mouth Once A Day 18)  Nexium 40 Mg Cpdr (Esomeprazole Magnesium) .Marland Kitchen.. 1 By Mouth Before Supper 19)  Benadryl 25 Mg Tabs (Diphenhydramine Hcl) .Marland Kitchen.. 1-2 By Mouth At Bedtime 20)  Astelin 137 Mcg/spray Soln (Azelastine Hcl) .... 2 Sprays Each Nostril  Two Times A Day  Allergies (verified): 1)  ! * Stadol  Vital Signs:  Patient profile:   70 year old female Height:      63 inches (160.02 cm) Weight:      179.13 pounds (81.42 kg) BMI:     31.85 O2 Sat:      98 % on 2 L/min Temp:     98.1 degrees F (36.72 degrees C) oral Pulse rate:   75 / minute BP sitting:   122 / 78  (right arm) Cuff size:   regular  Vitals Entered By: Michel Bickers CMA (October 16, 2010 2:38 PM)  O2 Sat at Rest %:  98 O2 Flow:  2 L/min  Serial Vital Signs/Assessments:  Comments: 3:16 PM Ambulatory Pulse Oximetry  Resting; HR_72____    02 Sat_93% on room air____  Lap1 (185 feet)   HR__90___   02 Sat__89% on room air___ Lap2 (185 feet)   HR__120___   02 Sat__82% on room air___    Lap3 (185 feet)   HR_____   02 Sat_____  ___Test Completed without Difficulty __x_Test Stopped due to: drop in sats to 82% after walking 2 laps on room air...sats recovered to 92% on 2 liters and pulse was 101.  By: Michel Bickers CMA   CC: PAH and CT chest follow-up...pt says she is doing well...not able to go without oxygen Comments Medications reviewed with patient Michel Bickers [facility]  October 16, 2010 2:39 PM   Physical Exam  Additional Exam:  GEN: A/Ox3; pleasant , NAD HEENT:  [place]/AT, , EACs-clear, TMs-wnl, NOSE-clear, THROAT-clear NECK:  Supple w/ fair ROM; no JVD; normal carotid impulses w/o bruits; no thyromegaly or nodules palpated; no lymphadenopathy. RESP  clear, no wheezes or crackles CARD:  RRR, no m/r/g   GI:   Soft & nt; nml bowel sounds; no organomegaly or masses detected. Musco: Warm bil,  no calf tenderness edema, clubbing, pulses intact Neuro:  intact alert and oriented x 3    Impression & Recommendations:  Problem # 1:  PULMONARY HYPERTENSION (ICD-416.8)  Very interesting case with dyspnea that has improved. She had ground glass on CT, PAH by R heart cath w elevated PAOP. In the end this may have been cardiogenic pulm edema, L heart disease leading  to [facility]. She is better in all respects, did not desat on walking oximetry, CT improved. She was no better on Revatio trial (worse actually).  - will obtain TTE from June to see if PAP' s improved along w everything else - Consider repeat TTE or R heart cath  Orders: Est. 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 Take 1 tablet by mouth once a day    Bayer Low  Strength 81 Mg Tbec (Aspirin) .Marland Kitchen... Take 1 tablet by mouth once a day  Problem # 3:  INTRINSIC ASTHMA, UNSPECIFIED (ICD-493.10) maxair as needed   Medications Added to Medication List This Visit: 1)  Crestor---unsure of Mg?  Marland Kitchen... Take 1 tab by mouth at bedtime 2)  Levothyroxine Sodium 137 Mcg Tabs (Levothyroxine sodium) .Marland Kitchen.. 1 by mouth daily  Patient Instructions: 1)  We will obtain your echo done in [place] in June 2)  Walking oximetry today shows that you do need to wear oxygen when walking. 3)  Your oxygen level did not drop while you were sleeping. You can stop wearing it at night 4)  Your CT scan shows improvement in your inflammatory changes in the lungs (compared with a year ago) 5)  Continue to have Maxair available to use as needed  6)  Follow up with Dr Delton Coombes in 6 months or as needed

## 2010-12-11 NOTE — Progress Notes (Signed)
Summary: ORDER REQ / OV NOTES  Phone Note From Other Clinic   Caller: BETTY PUCKETT W/ [facility] Call For: PARRETT Summary of Call: PT SAW TP FOR O2 RE-CERT. 16/10. CALLER REQUESTS OV NOTES AND ORDER TO D/C O2. FAX TO: [phone]. CONTACT # IS [phone] THEY WONT TO ALSO ORDER OVERNIGHT TO BE SURE SHE DOESNT NEED IT THEY WILL FAX OVER AND ORDER FOR IT Initial call taken by: Tivis Ringer, CNA,  September 24, 2010 3:15 PM  Follow-up for Phone Call        per OV append to last OV with TP Dr. Delton Coombes wants to wait to d/c oxygen until he sees the pt on 10-16-10. I advised Kathie Rhodes of this at [place]. She wants to know does Dr. Delton Coombes want to do an ONO in the meantime to see if pt qualifies for o2 with sleep? If so she has faxed over an order formfor RB to sign. Please advise. Carron Curie CMA  September 24, 2010 4:10 PM  OK with me to go ahead and d/c her O2 with ambulation. Also, go ahead and order the overnight oximetry on RA Leslye Peer MD  September 25, 2010 3:31 PM  Follow-up by: Leslye Peer MD,  September 25, 2010 3:31 PM  Additional Follow-up for Phone Call Additional follow up Details #1::        Spoke with Kathie Rhodes and notified okay to d/c o2 with ambulation and also do ONO on RA.  She still needs order faxed to her.  Will send order to [facility].

## 2010-12-11 NOTE — Miscellaneous (Signed)
Summary: guidelines/[facility]  [employer]   Imported By: Lester Patmos 01/04/2010 08:53:41  _____________________________________________________________________  External Attachment:    Type:   Image     Comment:   External Document

## 2010-12-11 NOTE — Letter (Signed)
Summary: [facility]  [facility]   Imported By: Sherian Rein 09/27/2010 07:37:35  _____________________________________________________________________  External Attachment:    Type:   Image     Comment:   External Document

## 2010-12-11 NOTE — Progress Notes (Signed)
Summary: prescription  Phone Note Call from Patient Call back at Home Phone [phone] Call back at [phone]   Caller: Patient Call For: Byrum Summary of Call: Needs rx for nose spray.//[facility] Initial call taken by: Darletta Moll,  July 04, 2010 2:32 PM  Follow-up for Phone Call        Called and spoke with pt.  She states that she had discussed a nasal spray- ? name with RB at last ov for runny nose.  She states that her runny nose is no better, "runs constantly"- requesting that we send in rx.  Pt aware RB not back in the office until 07/05/10 pm and is okay with waiting.  Pls advise, thanks! Follow-up by: Vernie Murders,  July 04, 2010 2:39 PM  Additional Follow-up for Phone Call Additional follow up Details #1::        Script for astelin sent to her pharmacy. Leslye Peer MD  July 08, 2010 3:22 PM  Additional Follow-up by: Leslye Peer MD,  July 08, 2010 3:22 PM    Additional Follow-up for Phone Call Additional follow up Details #2::    Spoke with pt and notified that rx for astelin was sent to pharm. Follow-up by: Vernie Murders,  July 09, 2010 8:55 AM  New/Updated Medications: ASTELIN 137 MCG/SPRAY SOLN (AZELASTINE HCL) 2 sprays each nostril two times a day Prescriptions: ASTELIN 137 MCG/SPRAY SOLN (AZELASTINE HCL) 2 sprays each nostril two times a day  #1 x 5   Entered and Authorized by:   Leslye Peer MD   Signed by:   Leslye Peer MD on 07/08/2010   Method used:   Electronically to        [facility]  [address] [phone]* (retail)       [address]       [place], [place]  [address]       Ph: [phone]       Fax: [phone]   RxID:   [id number]

## 2010-12-11 NOTE — Progress Notes (Signed)
Summary: qualifing sats  Phone Note From Other Clinic   Caller: [phone] Deretha Call For: byrum Summary of Call: needs qualifing sats on 08/13/2009 to 02/11/10 also needs cmn returned Initial call taken by: Lacinda Axon,  June 20, 2010 12:31 PM  Follow-up for Phone Call        RB, please advise as I dont see in the chart the sats for the dates they are requesting and REMINDER to do CMN for pt. Thanks.Reynaldo Minium CMA  June 20, 2010 3:06 PM   Who started the patient on oxygen? She was already on oxygen when first seen in our office in 2010. I do not have CMN from DME. Follow-up by: Michel Bickers CMA,  June 21, 2010 10:25 AM  Additional Follow-up for Phone Call Additional follow up Details #1::        LMOVM for Mamye TCB Vernie Murders  June 21, 2010 12:13 PM  carol from [facility] will call back on mon     Additional Follow-up for Phone Call Additional follow up Details #2::    faxed cmn and consult notes to [email or web address]   Follow-up by: Oneita Jolly,  June 25, 2010 2:16 PM

## 2010-12-11 NOTE — Letter (Signed)
Summary: [facility]  [facility]   Imported By: Sherian Rein 03/20/2010 08:17:29  _____________________________________________________________________  External Attachment:    Type:   Image     Comment:   External Document

## 2010-12-11 NOTE — Letter (Signed)
Summary: CMN/[facility]  CMN/[facility]   Imported By: Lester Goshen 02/19/2010 10:50:53  _____________________________________________________________________  External Attachment:    Type:   Image     Comment:   External Document

## 2010-12-11 NOTE — Letter (Signed)
Summary: CMN for Oxygen/[facility]  [facility] for Oxygen/[facility]   Imported By: Sherian Rein 04/23/2010 13:16:44  _____________________________________________________________________  External Attachment:    Type:   Image     Comment:   External Document

## 2010-12-11 NOTE — Assessment & Plan Note (Signed)
Summary: PAH, abnormal CT scan   Visit Type:  Follow-up Copy to:  Dr. Levy Pupa Primary Provider/Referring Provider:  Dr. Baltazar Najjar  CC:  PAH. The patient says her breathing has been better this summer but she does c/o a constant runny nose. The swelling in her legs improved after she stopped Revatio.[name]  History of Present Illness: 70 year old female admitted 03/10/09 for increased dsypnea, chest pain, underwent card cath on 03/10/09 that showed significant Pulmonary HTN w/ PAP-86/29 , wedge of 21-23. Pulmonary/CCM consulted in hospital. Pt was discharged on continuous O2 due to persistent hypoxia. She is a never smoker, carries dx of asthma for several years. See prior notes for autoimmune w/u. Has also had an abnormal CT scan, heterogeneous GGI.   ROV 10/16/09 -- returns for regular f/u. has been on Revatio for a few months now. She believes that she has more stamina, can do more. She is reliable about using her O2. For last few days she has been hearing wheeze with exertion. More nasal drainage, blows out mucous and some blood in the am from her nose. Has been on loratadine without relief.  ROV 01/22/10 -- continues to have exertional SOB. Feels overall better on the Revatio. Uses maxair fairly rarely.   ROV 04/30/10 -- Returns for her pulm HTN. She tells me that she ran out of Revatio about 10 days ago because her paperwork lapsed - she hasn't done her taxes yet, which appears to be the barrier. In retrospect she tells me that she believes that her breathing has been a bit better off the Revatio. She also feels that her swelling and her dizziness are better off the med. She also feels that her mood is improved as well. last CT scan of the chest to evaluate her was 11/10.   ROV 06/12/10 -- PAH, GGI on CT scan in setting elevated PAOP, ? asthma. Uses HCTZ once daily, bumex as needed (not requiring at this time, since d/c of revatio). Breathing is about the same even off revatio. Dizziness is  better of revatio. Still with lots of PND, a bit better on benadryl.   Current Medications (verified): 1)  Klor-Con 10 10 Meq Cr-Tabs (Potassium Chloride) .... 2 Tablets in The Mornings, and 1 Tablet in The Evening 2)  Simvastatin 80 Mg Tabs (Simvastatin) .... Take 1 Tab By Mouth At Bedtime 3)  Centrum Silver  Tabs (Multiple Vitamins-Minerals) .... Take 1 Tablet By Mouth Once A Day 4)  Femara 2.5 Mg Tabs (Letrozole) .... Take 1 Tablet By Mouth Once A Day 5)  Hydrochlorothiazide 25 Mg Tabs (Hydrochlorothiazide) .... Take 1 Tablet By Mouth Once A Day 6)  Bisoprolol Fumarate 5 Mg Tabs (Bisoprolol Fumarate) .... Take 1 Tablet By Mouth Once A Day 7)  Bayer Low Strength 81 Mg Tbec (Aspirin) .... Take 1 Tablet By Mouth Once A Day 8)  Amitriptyline Hcl 75 Mg Tabs (Amitriptyline Hcl) .... 2 Tablets By Mouth At Bedtime 9)  Ambien 10 Mg Tabs (Zolpidem Tartrate) .... Take 1 Tab By Mouth At Bedtime 10)  Bumetanide 1 Mg Tabs (Bumetanide) .... Take 1 Tablet By Mouth Once A Day As Needed 11)  Levothyroxine Sodium 112 Mcg Tabs (Levothyroxine Sodium) .... Take 1 Tablet By Mouth Once A Day 12)  Cyanocobalamin 1000 Mcg/ml Soln (Cyanocobalamin) .[name].. 1 Injection Every Month 13)  Glucophage Xr 500 Mg Xr24h-Tab (Metformin Hcl) .... Take 1 Tablet By Mouth Every Morning and 2 Every Evening 14)  Hydrocodone-Acetaminophen 7.5-650 Mg Tabs (Hydrocodone-Acetaminophen) .[name] KitchenMarland KitchenMarland [name]  Take 1 Tablet By Mouth Three Times A Day As Needed 15)  Diovan 160 Mg Tabs (Valsartan) .... Take 1 Tablet By Mouth Once A Day 16)  Maxair Autohaler 200 Mcg/inh Aerb (Pirbuterol Acetate) .... Inhale 2 Puffs Every Four Hours As Needed 17)  Calcium Carbonate-Vitamin D 600-400 Mg-Unit  Tabs (Calcium Carbonate-Vitamin D) .... Take 1 Tablet By Mouth Two Times A Day 18)  Folic Acid 1 Mg Tabs (Folic Acid) .... Take 2 Tablet By Mouth Once A Day 19)  Revatio 20 Mg Tabs (Sildenafil Citrate) .[name].. 1 By Mouth Two Times A Day 20)  Nexium 40 Mg Cpdr (Esomeprazole  Magnesium) .[name].. 1 By Mouth Before Supper 21)  Benadryl 25 Mg Tabs (Diphenhydramine Hcl) .[name].. 1-2 By Mouth At Bedtime  Allergies (verified): 1)  ! * Stadol  Vital Signs:  Patient profile:   70 year old female Height:      63 inches (160.02 cm) Weight:      178.50 pounds (81.14 kg) BMI:     31.73 O2 Sat:      99 % on 2 L/min Temp:     98.4 degrees F (36.89 degrees C) oral Pulse rate:   79 / minute BP sitting:   120 / 62  (right arm) Cuff size:   regular  Vitals Entered By: Michel Bickers CMA (June 12, 2010 4:09 PM)  O2 Sat at Rest %:  99 O2 Flow:  2 L/min  Physical Exam  Additional Exam:  GEN: A/Ox3; pleasant , NAD HEENT:  [place]/AT, , EACs-clear, TMs-wnl, NOSE-clear, THROAT-clear NECK:  Supple w/ fair ROM; no JVD; normal carotid impulses w/o bruits; no thyromegaly or nodules palpated; no lymphadenopathy. RESP  Mostly clear, no wheezes or crackles CARD:  RRR, no m/r/g   GI:   Soft & nt; nml bowel sounds; no organomegaly or masses detected. Musco: Warm bil,  no calf tenderness edema, clubbing, pulses intact Neuro:  intact alert and oriented x 3    Impression & Recommendations:  Problem # 1:  PULMONARY HYPERTENSION (ICD-416.8)  with elevated PAOP, hx GGI on CT scan (? pulm edema). Clinically improved off revatio.  - will not restart revatio at this time.  - discuss timing of TTE at next visit - diuretics as ordered  Orders: Est. Patient Level IV (16109)  Problem # 2:  CT, CHEST, ABNORMAL (ICD-793.1)  Ground Glass, ? etiology.  - schedule repeat CT in [place] in Nov 2011.  - ROV to review once completed  Orders: Est. 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 Take 1 tablet by mouth once a day    Bayer Low Strength 81 Mg Tbec (Aspirin) .[name]... Take 1 tablet by mouth once a day  Problem # 4:  TRICUSPID  VALVE DISORDER (ICD-397.0)  Her updated medication list for this problem includes:    Bisoprolol Fumarate 5 Mg Tabs (Bisoprolol fumarate) .[name]... Take 1 tablet by mouth once a day    Bayer Low Strength 81 Mg Tbec (Aspirin) .[name]... Take 1 tablet by mouth once a day  Problem # 5:  ALLERGIC RHINITIS (ICD-477.9)  Her updated medication list for this problem includes:    Benadryl 25 Mg Tabs (Diphenhydramine hcl) .[name]... 1-2 by mouth at bedtime  Patient Instructions: 1)  Continue your oxygen at all times.  2)  We will not restart Revatio at this  time.  3)  We will repeat your CT scan of the chest in November 2011 4)  We will follow up in November to review your CT scan.  5)  We will discuss the timing of a repeat echocardiogram at your next visit.

## 2010-12-11 NOTE — Assessment & Plan Note (Signed)
Summary: Nurse visit to re-qualify for o2   CC:  pt here to re-qualify for o2.  states breathing has been doing well.  no new complaints..  Medications Prior to Update: 1)  Klor-Con 10 10 Meq Cr-Tabs (Potassium Chloride) .... 2 Tablets in The Mornings, and 1 Tablet in The Evening 2)  Simvastatin 80 Mg Tabs (Simvastatin) .... Take 1 Tab By Mouth At Bedtime 3)  Centrum Silver  Tabs (Multiple Vitamins-Minerals) .... Take 1 Tablet By Mouth Once A Day 4)  Femara 2.5 Mg Tabs (Letrozole) .... Take 1 Tablet By Mouth Once A Day 5)  Hydrochlorothiazide 25 Mg Tabs (Hydrochlorothiazide) .... Take 1 Tablet By Mouth Once A Day 6)  Bisoprolol Fumarate 5 Mg Tabs (Bisoprolol Fumarate) .... Take 1 Tablet By Mouth Once A Day 7)  Bayer Low Strength 81 Mg Tbec (Aspirin) .... Take 1 Tablet By Mouth Once A Day 8)  Amitriptyline Hcl 75 Mg Tabs (Amitriptyline Hcl) .... 2 Tablets By Mouth At Bedtime 9)  Ambien 10 Mg Tabs (Zolpidem Tartrate) .... Take 1 Tab By Mouth At Bedtime 10)  Bumetanide 1 Mg Tabs (Bumetanide) .... Take 1 Tablet By Mouth Once A Day As Needed 11)  Levothyroxine Sodium 112 Mcg Tabs (Levothyroxine Sodium) .... Take 1 Tablet By Mouth Once A Day 12)  Cyanocobalamin 1000 Mcg/ml Soln (Cyanocobalamin) .Marland Kitchen.. 1 Injection Every Month 13)  Glucophage Xr 500 Mg Xr24h-Tab (Metformin Hcl) .... Take 1 Tablet By Mouth Every Morning and 2 Every Evening 14)  Hydrocodone-Acetaminophen 7.5-650 Mg Tabs (Hydrocodone-Acetaminophen) .... Take 1 Tablet By Mouth Three Times A Day As Needed 15)  Diovan 160 Mg Tabs (Valsartan) .... Take 1 Tablet By Mouth Once A Day 16)  Maxair Autohaler 200 Mcg/inh Aerb (Pirbuterol Acetate) .... Inhale 2 Puffs Every Four Hours As Needed 17)  Calcium Carbonate-Vitamin D 600-400 Mg-Unit  Tabs (Calcium Carbonate-Vitamin D) .... Take 1 Tablet By Mouth Two Times A Day 18)  Folic Acid 1 Mg Tabs (Folic Acid) .... Take 2 Tablet By Mouth Once A Day 19)  Revatio 20 Mg Tabs (Sildenafil Citrate) .Marland Kitchen.. 1 By  Mouth Two Times A Day 20)  Nexium 40 Mg Cpdr (Esomeprazole Magnesium) .Marland Kitchen.. 1 By Mouth Before Supper 21)  Benadryl 25 Mg Tabs (Diphenhydramine Hcl) .Marland Kitchen.. 1-2 By Mouth At Bedtime 22)  Astelin 137 Mcg/spray Soln (Azelastine Hcl) .... 2 Sprays Each Nostril Two Times A Day  Allergies (verified): 1)  ! * Stadol  Vital Signs:  Patient profile:   70 year old female Height:      63 inches Weight:      178.50 pounds BMI:     31.73 O2 Sat:      95 % on 2 L/min pulsing Temp:     99.7 degrees F oral Pulse rate:   79 / minute BP sitting:   136 / 72  (right arm) Cuff size:   regular  Vitals Entered By: Boone Master CNA/MA (September 20, 2010 2:42 PM)  O2 Flow:  2 L/min pulsing CC: pt here to re-qualify for o2.  states breathing has been doing well.  no new complaints. Is Patient Diabetic? Yes Comments Medications reviewed with patient Daytime contact number verified with patient. Boone Master CNA/MA  September 20, 2010 2:41 PM   Ambulatory Pulse Oximetry  Resting; HR__69___    02 Sat__100ra___  Lap1 (185 feet)   HR__86___   02 Sat__95ra___ Lap2 (185 feet)   HR__92___   02 Sat__98ra___    Lap3 (185 feet)   HR__101___  02 Sat__97ra___  _X__Test Completed without Difficulty ___Test Stopped due to:  Boone Master CNA/MA  September 20, 2010 2:41 PM     Impression & Recommendations:  Problem # 1:  PULMONARY HYPERTENSION (ICD-416.8)  Orders: Est. Patient Level I (16109)  Patient Instructions: 1)  pt to return to see Dr. Delton Coombes to discuss no desats on room air with walkiing.   Appended Document: Nurse visit to re-qualify for o2 per RB, will dc o2 at 12.6.11 ov.  called spoke with patient, advised of RB's recs.  pt verbalized her understanding.

## 2010-12-13 NOTE — Letter (Signed)
Summary: CMN for Oxygen/[facility]  CMN for Oxygen/[facility]   Imported By: Sherian Rein 11/14/2010 11:14:43  _____________________________________________________________________  External Attachment:    Type:   Image     Comment:   External Document

## 2010-12-13 NOTE — Procedures (Signed)
Summary: Oximetry/[facility]  Oximetry/[facility]   Imported By: Lester Maharishi [place] 10/27/2010 09:49:00  _____________________________________________________________________  External Attachment:    Type:   Image     Comment:   External Document

## 2010-12-13 NOTE — Assessment & Plan Note (Signed)
Summary: Nurse visit to requalify for o2   CC:  nurse visit to re-qualify for oxygen.  states breathing is doing well.  no new complaints..  Medications Prior to Update: 1)  Klor-Con 10 10 Meq Cr-Tabs (Potassium Chloride) .... 2 Tablets in The Mornings, and 1 Tablet in The Evening 2)  Crestor---Unsure of Mg? Marland Kitchen... Take 1 Tab By Mouth At Bedtime 3)  Centrum Silver  Tabs (Multiple Vitamins-Minerals) .... Take 1 Tablet By Mouth Once A Day 4)  Femara 2.5 Mg Tabs (Letrozole) .... Take 1 Tablet By Mouth Once A Day 5)  Hydrochlorothiazide 25 Mg Tabs (Hydrochlorothiazide) .... Take 1 Tablet By Mouth Once A Day 6)  Bisoprolol Fumarate 5 Mg Tabs (Bisoprolol Fumarate) .... Take 1 Tablet By Mouth Once A Day 7)  Bayer Low Strength 81 Mg Tbec (Aspirin) .... Take 1 Tablet By Mouth Once A Day 8)  Amitriptyline Hcl 75 Mg Tabs (Amitriptyline Hcl) .... 2 Tablets By Mouth At Bedtime 9)  Ambien 10 Mg Tabs (Zolpidem Tartrate) .... Take 1 Tab By Mouth At Bedtime 10)  Levothyroxine Sodium 137 Mcg Tabs (Levothyroxine Sodium) .Marland Kitchen.. 1 By Mouth Daily 11)  Cyanocobalamin 1000 Mcg/ml Soln (Cyanocobalamin) .Marland Kitchen.. 1 Injection Every Month 12)  Glucophage Xr 500 Mg Xr24h-Tab (Metformin Hcl) .... Take 1 Tablet By Mouth Every Morning and 2 Every Evening 13)  Hydrocodone-Acetaminophen 7.5-650 Mg Tabs (Hydrocodone-Acetaminophen) .... Take 1 Tablet By Mouth Three Times A Day As Needed 14)  Diovan 160 Mg Tabs (Valsartan) .... Take 1 Tablet By Mouth Once A Day 15)  Maxair Autohaler 200 Mcg/inh Aerb (Pirbuterol Acetate) .... Inhale 2 Puffs Every Four Hours As Needed 16)  Calcium Carbonate-Vitamin D 600-400 Mg-Unit  Tabs (Calcium Carbonate-Vitamin D) .... Take 1 Tablet By Mouth Two Times A Day 17)  Folic Acid 1 Mg Tabs (Folic Acid) .... Take 2 Tablet By Mouth Once A Day 18)  Nexium 40 Mg Cpdr (Esomeprazole Magnesium) .Marland Kitchen.. 1 By Mouth Before Supper 19)  Benadryl 25 Mg Tabs (Diphenhydramine Hcl) .Marland Kitchen.. 1-2 By Mouth At Bedtime 20)  Astelin 137  Mcg/spray Soln (Azelastine Hcl) .... 2 Sprays Each Nostril Two Times A Day  Allergies (verified): 1)  ! * Stadol  Vital Signs:  Patient profile:   70 year old female Height:      63 inches Weight:      179 pounds BMI:     31.82 O2 Sat:      93 % on 2 L/min pulsing Temp:     99.0 degrees F oral Pulse rate:   76 / minute BP sitting:   142 / 76  (right arm) Cuff size:   regular  Vitals Entered By: Boone Master CNA/MA (October 25, 2010 4:02 PM)  O2 Flow:  2 L/min pulsing CC: nurse visit to re-qualify for oxygen.  states breathing is doing well.  no new complaints. Comments Ambulatory Pulse Oximetry  Resting; HR__76___    02 Sat__99 ra___  Lap1 (185 feet)   HR___97__   02 Sat__94 ra___ Lap2 (185 feet)   HR___100__   02 Sat___88 ra__.  placed pt on 2L/min o2.  allowed o2 to increase to 95%.  began walking again per [facility]'s request for insurance.    Lap3 (185 feet)   HR__100___   02 Sat__93 2L___  _X__Test Completed without Difficulty ___Test Stopped due to: Boone Master CNA/MA  October 25, 2010 4:05 PM   called Christoper Allegra at number [phone] phone and explained the data received at [facility] before pt left to  ensure that all information was correct for insurance to pay for o2.  per Zella Ball at [place], all info was correct.  will forward to RB. Boone Master CNA/MA  October 25, 2010 4:07 PM     Other Orders: Est. Patient Level I [phone])

## 2010-12-13 NOTE — Progress Notes (Signed)
Summary: O2 qualification  Phone Note Call from Patient Call back at Home Phone [phone]   Caller: Patient Call For: byrum Summary of Call: pt states that [facility] says that in order for pt to qualify for O2 (with medicare) she needs to be checked (another ov) "walking while on O2- also sitting "for a while" then checked again. she was told that this had not been done correctly per [facility] in [place] call pt and advise Initial call taken by: Tivis Ringer, CNA,  October 24, 2010 9:52 AM  Follow-up for Phone Call        Before I had the chance to call pt, Mindi Junker with [facility] was calling re: above.  Per Kathie Rhodes, they will need documentation of pt's walk.  Resting RA sat, Walking RA sat, and Walking o2 sat along with order for o2.  This needs to be faxed to [phone].  Betty's number [phone].    Per last OV note on 10/16/10 with RB, documentation of resting RA sat and walking RA sat but no walk on o2.  Per Kathie Rhodes with [facility], the pt actually has to be walked on the o2 to make sure it is sufficent for Medicare to cover o2.  Spoke with pt.  She is also aware of the above and ok to come in for this.  OV scheduled with TP/Jessica J for tomorrow at 3pm for walk.  Kathie Rhodes aware of this as well.  Dr. Delton Coombes,  we need an order for pt's o2.  Pls advise on how many liters pt is supposed to be using when walking so order can be put in for [facility] in [place].  Thanks! Follow-up by: Gweneth Dimitri RN,  October 24, 2010 11:45 AM  Additional Follow-up for Phone Call Additional follow up Details #1::        She needs O2 with exertion, not at rest, not while sleeping, only only only with exertion.  her last walk confirmed desat with exertion, improved to 92% on 2L/min/  please start 2L/min with exertion.. Thank you Leslye Peer MD  October 24, 2010 5:43 PM  Additional Follow-up by: Leslye Peer MD,  October 24, 2010 5:43 PM    Additional Follow-up for Phone Call Additional follow up  Details #2::    this will be documented at today's OV w/ TP. Boone Master CNA/MA  October 25, 2010 12:53 PM

## 2011-01-18 ENCOUNTER — Ambulatory Visit: Payer: Self-pay | Admitting: Cardiology

## 2011-01-24 ENCOUNTER — Encounter: Payer: Self-pay | Admitting: *Deleted

## 2011-02-19 LAB — CBC
HCT: 37 % (ref 36.0–46.0)
Hemoglobin: 12.4 g/dL (ref 12.0–15.0)
MCHC: 33.7 g/dL (ref 30.0–36.0)
MCHC: 33.8 g/dL (ref 30.0–36.0)
MCHC: 34.2 g/dL (ref 30.0–36.0)
MCV: 88.6 fL (ref 78.0–100.0)
Platelets: 138 10*3/uL — ABNORMAL LOW (ref 150–400)
RBC: 3.7 MIL/uL — ABNORMAL LOW (ref 3.87–5.11)
RDW: 14.5 % (ref 11.5–15.5)
RDW: 15.1 % (ref 11.5–15.5)
WBC: 7.7 10*3/uL (ref 4.0–10.5)

## 2011-02-19 LAB — BASIC METABOLIC PANEL
BUN: 12 mg/dL (ref 6–23)
CO2: 26 mEq/L (ref 19–32)
CO2: 28 mEq/L (ref 19–32)
CO2: 29 mEq/L (ref 19–32)
Calcium: 9.4 mg/dL (ref 8.4–10.5)
Calcium: 9.5 mg/dL (ref 8.4–10.5)
Chloride: 98 mEq/L (ref 96–112)
Creatinine, Ser: 0.9 mg/dL (ref 0.4–1.2)
Creatinine, Ser: 0.98 mg/dL (ref 0.4–1.2)
GFR calc Af Amer: >60 mL/min (ref >60)
GFR calc Af Amer: >60 mL/min (ref >60)
Glucose, Bld: 112 mg/dL — ABNORMAL HIGH (ref 70–99)
Glucose, Bld: 123 mg/dL — ABNORMAL HIGH (ref 70–99)
Potassium: 3.3 mEq/L — ABNORMAL LOW (ref 3.5–5.1)
Sodium: 138 mEq/L (ref 135–145)

## 2011-02-19 LAB — GLUCOSE, CAPILLARY
Glucose-Capillary: 114 mg/dL — ABNORMAL HIGH (ref 70–99)
Glucose-Capillary: 127 mg/dL — ABNORMAL HIGH (ref 70–99)
Glucose-Capillary: 98 mg/dL (ref 70–99)

## 2011-02-20 ENCOUNTER — Encounter: Payer: Self-pay | Admitting: Cardiology

## 2011-02-20 ENCOUNTER — Ambulatory Visit (INDEPENDENT_AMBULATORY_CARE_PROVIDER_SITE_OTHER): Payer: Medicare Other | Admitting: Cardiology

## 2011-02-20 VITALS — BP 100/64 | HR 70 | Ht 63.0 in | Wt 167.0 lb

## 2011-02-20 DIAGNOSIS — I6350 Cerebral infarction due to unspecified occlusion or stenosis of unspecified cerebral artery: Secondary | ICD-10-CM

## 2011-02-20 DIAGNOSIS — I2510 Atherosclerotic heart disease of native coronary artery without angina pectoris: Secondary | ICD-10-CM

## 2011-02-20 DIAGNOSIS — I639 Cerebral infarction, unspecified: Secondary | ICD-10-CM

## 2011-02-20 DIAGNOSIS — I2789 Other specified pulmonary heart diseases: Secondary | ICD-10-CM

## 2011-02-20 LAB — POCT I-STAT 3, VENOUS BLOOD GAS (G3P V)
Acid-base deficit: 2 mmol/L (ref 0.0–2.0)
O2 Saturation: 49 %
TCO2: 26 mmol/L (ref 0–100)
pH, Ven: 7.396 — ABNORMAL HIGH (ref 7.250–7.300)
pO2, Ven: 27 mmHg — CL (ref 30.0–45.0)

## 2011-02-20 LAB — C-REACTIVE PROTEIN: CRP: 0.2 mg/dL — ABNORMAL LOW (ref <0.6)

## 2011-02-20 LAB — POCT I-STAT 3, ART BLOOD GAS (G3+)
Bicarbonate: 24.7 mEq/L — ABNORMAL HIGH (ref 20.0–24.0)
TCO2: 26 mmol/L (ref 0–100)
pH, Arterial: 7.409 — ABNORMAL HIGH (ref 7.350–7.400)

## 2011-02-20 LAB — RENAL FUNCTION PANEL
CO2: 27 mEq/L (ref 19–32)
Chloride: 104 mEq/L (ref 96–112)
Glucose, Bld: 120 mg/dL — ABNORMAL HIGH (ref 70–99)
Potassium: 3.5 mEq/L (ref 3.5–5.1)
Sodium: 141 mEq/L (ref 135–145)

## 2011-02-20 LAB — POCT I-STAT GLUCOSE: Operator id: 141321

## 2011-02-20 LAB — CBC
Hemoglobin: 11.4 g/dL — ABNORMAL LOW (ref 12.0–15.0)
MCHC: 33 g/dL (ref 30.0–36.0)
MCV: 87.1 fL (ref 78.0–100.0)
RBC: 3.96 MIL/uL (ref 3.87–5.11)
RDW: 14.6 % (ref 11.5–15.5)

## 2011-02-20 LAB — GLUCOSE, CAPILLARY
Glucose-Capillary: 105 mg/dL — ABNORMAL HIGH (ref 70–99)
Glucose-Capillary: 114 mg/dL — ABNORMAL HIGH (ref 70–99)

## 2011-02-20 NOTE — Assessment & Plan Note (Signed)
No obvious angina, history of nonobstructive disease. Continue medical therapy and observation.

## 2011-02-20 NOTE — Progress Notes (Signed)
Clinical Summary [name] is a 70 y.o.female presenting for followup. She was seen in July 2011.   She is here with her husband. Reports a "stroke" in [place] back in December, principally affecting her left leg. She states she had an echocardiogram done at that time, report to be requested.  Since then she states being back to baseline. She continues on home oxygen, followed by Dr. Delton Coombes in our pulmonary clinic with severe pulmonary hypertension. Patient prefers to stay off of Revatio.  She does endorse a fairly long-standing history of intermittent palpitations, states that she has worn monitors previously, and has no clearly documented history of atrial fibrillation. This would be of concern however with history of stroke in December, and Coumadin would certainly be a consideration in that event. For now she is on Plavix.  Reports followup blood work with Dr. Darius Bump. She is now on Crestor.   Allergies  Allergen Reactions  . Butorphanol Tartrate     REACTION: migraines, nervous/agitated  . Lipitor (Atorvastatin Calcium) Diarrhea    nightmares  . Revatio (Sildenafil Citrate) Swelling    Current outpatient prescriptions:amitriptyline (ELAVIL) 75 MG tablet, 2 tabs every evening , Disp: , Rfl: ;  bisoprolol (ZEBETA) 5 MG tablet, Take 5 mg by mouth daily.  , Disp: , Rfl: ;  calcium carbonate (OS-CAL) 600 MG TABS, Take 600 mg by mouth 2 (two) times daily with a meal.  , Disp: , Rfl: ;  clopidogrel (PLAVIX) 75 MG tablet, Take 75 mg by mouth daily.  , Disp: , Rfl:  cyanocobalamin (,VITAMIN B-12,) 1000 MCG/ML injection, Inject 1,000 mcg into the muscle every 30 (thirty) days.  , Disp: , Rfl: ;  folic acid (FOLVITE) 1 MG tablet, Take 2 tabs every morning, Disp: , Rfl: ;  hydrochlorothiazide 25 MG tablet, Take 25 mg by mouth daily.  , Disp: , Rfl: ;  hydrocodone-acetaminophen (LORCET PLUS) 7.5-650 MG per tablet, May take up to three times daily as needed, Disp: , Rfl:  letrozole (FEMARA) 2.5 MG  tablet, Take 2.5 mg by mouth daily.  , Disp: , Rfl: ;  levothyroxine (SYNTHROID, LEVOTHROID) 137 MCG tablet, Take 137 mcg by mouth daily.  , Disp: , Rfl: ;  metFORMIN (GLUCOPHAGE) 500 MG tablet, Take one tab every morning & 2 tabs with supper, Disp: , Rfl: ;  Multiple Vitamins-Minerals (CENTRUM SILVER PO), Take 1 tablet by mouth daily.  , Disp: , Rfl:  pantoprazole (PROTONIX) 40 MG tablet, Take 40 mg by mouth daily.  , Disp: , Rfl: ;  potassium chloride (KLOR-CON) 10 MEQ CR tablet, Take 10 mEq by mouth. 2 tabs am 1 tab pm, Disp: , Rfl: ;  rosuvastatin (CRESTOR) 10 MG tablet, Take 10 mg by mouth daily.  , Disp: , Rfl: ;  valsartan (DIOVAN) 160 MG tablet, Take 160 mg by mouth daily.  , Disp: , Rfl: ;  zolpidem (AMBIEN) 10 MG tablet, Take 10 mg by mouth at bedtime as needed.  , Disp: , Rfl:  amoxicillin (AMOXIL) 500 MG tablet, Take 4 tabs prior to dental appointments , Disp: , Rfl: ;  aspirin 81 MG tablet, Take 81 mg by mouth daily.  , Disp: , Rfl: ;  azelastine (ASTELIN) 137 MCG/SPRAY nasal spray, 1 spray by Nasal route 2 (two) times daily. Use in each nostril as directed , Disp: , Rfl: ;  diphenhydrAMINE (BENADRYL) 25 MG tablet, Take 25 mg by mouth at bedtime.  , Disp: , Rfl:  nitroGLYCERIN (NITROSTAT) 0.4 MG SL tablet,  Place 0.4 mg under the tongue every 5 (five) minutes as needed.  , Disp: , Rfl: ;  pirbuterol (MAXAIR) 200 MCG/INH inhaler, Inhale 2 puffs into the lungs 4 (four) times daily.  , Disp: , Rfl: ;  DISCONTD: esomeprazole (NEXIUM) 40 MG capsule, Take 40 mg by mouth daily before breakfast.  , Disp: , Rfl:   Past Medical History  Diagnosis Date  . Pulmonary hypertension     s/p RHC 4/10 - PAP 86/29, wedge 21-23  . Squamous cell carcinoma     Left arm  . Gastric ulcer with hemorrhage   . Rheumatic fever     Childhood  . Breast cancer     2008 s/p chemo and radiation (last tx 4/09) s/p left mastectomy s/p 9 lymph node resection - [name], [facility]  . Coronary artery disease      Nonobstructive, LVEF 55%  . Mitral regurgitation     Moderate  . 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 Stroke     12/11 [facility]    Social History [name] reports that she has never smoked. She has never used smokeless tobacco. [name] reports that she does not drink alcohol.  Review of Systems As outlined above, otherwise reviewed and negative.  Physical Examination Filed Vitals:   02/20/11 1129  BP: 100/64  Pulse: 70   Overweight woman in no acute distress wearing oxygen. HEENT: Conjunctiva and lids normal, oropharynx with moist mucosa. Neck: Supple, no elevated JVP or bruits. Lungs: Clear to auscultation, diminished but nonlabored. No wheezes. Cardiac: Regular rate and rhythm, prominent P2, 2/6 systolic murmur at the apex, no S3. Abdomen: Protuberant, nontender, bowel sounds present. Extremities: Erythema distally in pattern of venous stasis with 1+ edema below the knees bilaterally, distal pulses one plus. Skin: Warm and dry. Musculoskeletal: No kyphosis. Neuropsychiatric: Alert oriented x3, and grossly appropriate.   Studies Echocardiogram 03/06/2009: LVEF 60-65% with grade 1 diastolic dysfunction, moderate LAE, thickened mitral valve with somewhat restricted posterior leaflet associated with equivocal anterior leaflet prolapse, moderate eccentric mitral regurgitation, trivial aortic regurgitation, moderate to severe tricuspid regurgitation, RVSP 100 mmHg, dilated IVC with blunted respiratory is exchange.  Problem List and Plan

## 2011-02-20 NOTE — Assessment & Plan Note (Signed)
Per patient reportedly occurred in December, treated in [place]. Given her history of palpitations, I suggested a followup cardiac monitor to exclude atrial fibrillation. If this dysrhythmia were noted, Coumadin but certainly be a consideration, particularly in light of her other comorbid illnesses. She prefers to have this arranged through your primary care physician.

## 2011-02-20 NOTE — Patient Instructions (Signed)
Your physician wants you to follow-up in: 6 months. You will receive a reminder letter in the mail one-two months in advance. If you don't receive a letter, please call our office to schedule the follow-up appointment. Your physician recommends that you continue on your current medications as directed. Please refer to the Current Medication list given to you today. 

## 2011-02-20 NOTE — Progress Notes (Signed)
Per patient - had very mild stroke 11/08/2010 affecting left leg.  Was seen in [facility] [name].  Did echo there 10/23/2010.

## 2011-02-20 NOTE — Assessment & Plan Note (Signed)
Followed by Dr. Delton Coombes in our Pulmonary division, on chronic oxygen, intolerant of Revatio.

## 2011-03-03 ENCOUNTER — Other Ambulatory Visit: Payer: Self-pay | Admitting: Cardiology

## 2011-03-26 NOTE — Discharge Summary (Signed)
NAMEESTHEFANY, HERRIG NO.:  [id number]   MEDICAL RECORD NO.:  [id number]          PATIENT TYPE:  INP   LOCATION:  3733                         FACILITY:  [facility]   PHYSICIAN:  Luis Abed, MD, FACCDATE OF BIRTH:  August 21, 1941   DATE OF ADMISSION:  03/10/2009  DATE OF DISCHARGE:  03/13/2009                               DISCHARGE SUMMARY   PROCEDURES:  1. Cardiac catheterization.  2. Coronary arteriogram.  3. Left ventriculogram.  4. CT of the chest with contrast media.   PRIMARY FINAL DISCHARGE DIAGNOSIS:  Shortness of breath, home O2 added  this admission.   SECONDARY DIAGNOSES:  1. Pulmonary hypertension with pulmonary artery pressures of 86/29,      and a wedge pressure of 21-23, and right heart catheterization this      admission.  2. Nonobstructive coronary artery disease with cardiac catheterization      this admission showing 30% left anterior descending, 10-20% in the      circumflex right coronary artery with no disease.  3. Preserved left ventricular function with an ejection fraction of      55% at catheterization, and mild-to-moderate mitral regurgitation      with mild-to-moderate tricuspid regurgitation noted as well.  4. Status post CT angiogram of the chest this admission showing      diffuse ground-glass opacifications possibly fibrosis and diffuse      mediastinal lymphadenopathy with index lymph node in the right      pretracheal space measuring 1.4 cm.  5. Hypokalemia.  6. History of breast cancer in 2008.  7. History of rheumatic fever as a child.  8. Allergy or intolerance to STADOL.  9. History of gastric ulcer with hemorrhage and partial gastrectomy.  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   FAMILY HISTORY:  Coronary artery disease in her mother.   TIME OF DISCHARGE:  Forty-three minutes.   HOSPITAL COURSE:  Ms. Lanum is a 70 year old female with no previous  history of coronary artery disease.  She was  referred to Cardiology in  [facility] for dyspnea.  Outpatient cardiac catheterization was arranged and  she came to the hospital for this on March 10, 2009.   The heart cath results were described above.  She also had a right heart  cath showing pulmonary hypertension.  Pulmonary consult was called.   Her HCTZ was increased from 6.25-25 mg for diuresis.  There was concern  that she will need a sleep study, but this can be done as an outpatient.  Her O2 saturations dropped to 84% with minimal activity, hence home O2  is indicated.   On Mar 13, 2009, she was seen by Dr. Delford Field who felt that further  treatment of her pulmonary hypertension possibly to include steroids for  an open lung biopsy should be decided on as an outpatient.  Because of  the possibility of lipid aspiration, her fish oil discontinued.  She  will need more potassium supplementation because of the increase  diuretic dose and this is ordered as well.  The chest CT showed some  mildly  increased lymph nodes, but these can be followed by her  oncologist and Dr. Delton Coombes.  Ms. Peine was evaluated by Dr. Myrtis Ser and Dr.  Delford Field, and felt stable for discharge on Mar 13, 2009.   DISCHARGE INSTRUCTIONS:  Her activity levels to be increased gradually.  She is to call our office for any problems with the cath site.  She is  to stick to a low-sodium diabetic diet.   DISCHARGE MEDICATIONS:  1. Potassium chloride 10 mEq 2 tablets a.m. and 1 tablet p.m.  2. Zocor 80 mg at bedtime.  3. Ziac is discontinued.  4. Centrum Silver daily.  5. Fish oil is discontinued.  6. QVAR as needed.  7. Maxair as needed.  8. Sublingual nitroglycerin p.r.n.  9. Femara 2.5 mg daily.  10.Hydrochlorothiazide 25 mg daily.  11.Bisoprolol 5 mg a day.  12.Aspirin 81 mg daily.  13.Amitriptyline 75 mg 2 tablets at bedtime.  14.Ambien 10 mg at bedtime.  15.Bumex 1 mg daily.  16.Synthroid 115 mcg daily.  17.B12 injections every month.  18.Calcium plus D and  folic acid daily.  19.Glucophage XR 500 mg, restart on Mar 14, 2009.  20.Hydrocodone 7.5 mg t.i.d. p.r.n.  21.Diovan 160 mg q. Day.      Theodore Demark, PA-C      Luis Abed, MD, [facility]  Electronically Signed    RB/MEDQ  D:  03/13/2009  T:  03/13/2009  Job:  [id number]   cc:   Eloy End, MD  Leslye Peer, MD  [facility], [place], [place]

## 2011-03-26 NOTE — Cardiovascular Report (Signed)
[name], [name] NO.:  [id number]   MEDICAL RECORD NO.:  [id number]          PATIENT TYPE:  OIB   LOCATION:  1965                         FACILITY:  [facility]   PHYSICIAN:  Verne Carrow, MDDATE OF BIRTH:  1941/08/07   DATE OF PROCEDURE:  03/10/2009  DATE OF DISCHARGE:                            CARDIAC CATHETERIZATION   PRIMARY CARE PHYSICIAN:  Dr. Alton Revere.   PRIMARY CARDIOLOGIST:  Jonelle Sidle, MD   PROCEDURES PERFORMED:  1. Left heart catheterization.  2. Selective coronary angiography.  3. Left ventricular angiogram.   OPERATOR:  Verne Carrow, MD   INDICATIONS:  This is a pleasant 70 year old Caucasian female with a  past medical history significant for hypertension, hyperlipidemia,  diabetes mellitus, and recent chemotherapy and radiation for breast  cancer who has been complaining of some dyspnea with exertion and  occasional chest pains.  She was referred for an outpatient myocardial  perfusion stress study, which showed abnormalities in the inferior wall  that was suspicious for ischemia.  She had an echocardiogram earlier  this week that showed normal left ventricular systolic function with  moderate mitral regurgitation, moderate-to-severe tricuspid  regurgitation, and no other significant abnormalities.  She was referred  today  for a diagnostic left heart catheterization.   DETAILS OF PROCEDURE:  The patient was brought into the outpatient  cardiac catheterization laboratory after signing informed consent for  the procedure.  The right groin was prepped and draped in the sterile  fashion.  Lidocaine 1% was used for local anesthesia.  A 4-French sheath  was inserted into the right femoral artery without difficulty.  Standard  diagnostic catheters were used to perform selective coronary  angiography.  A 4-French pigtail catheter was used to perform left  ventricular angiography.  There was no significant gradient noted  across  the aortic valve on pullback.  The patient tolerated the procedure well  and was taken to the holding area in stable condition.   HEMODYNAMIC FINDINGS:  Central aortic pressure 169/76.  Left ventricular  pressure 170/15.  Left ventricular end-diastolic pressure 19.   ANGIOGRAPHIC FINDINGS:  1. The left main coronary artery had no evidence of disease.  2. The left anterior descending is a large vessel that courses to the      apex and gives off a moderate-sized diagonal branch.  There is 30%      plaque noted in the ostium of the left anterior descending coronary      artery.  There are mild luminal irregularities in the midportion of      the vessel.  The diagonal is free of any significant disease.  3. The circumflex artery is a moderate-sized vessel that gives off a      small early obtuse marginal branch and then 3 moderate-sized obtuse      marginal branches.  There appears to be a mild 10%-20% plaque in      the ostium of the circumflex artery.  There is no obstructive      disease in this system.  4. The right coronary artery is a small to  moderate sized nondominant      vessel that has no evidence of disease.  5. Left ventricular angiogram was performed in the RAO projection and      shows normal left ventricular systolic function with no wall motion      abnormalities.  Ejection fraction was 55%.  There was mild-to-      moderate mitral regurgitation noted.   IMPRESSION:  1. Mild nonobstructive coronary artery disease.  2. Normal left ventricular systolic function.   RECOMMENDATIONS:  I recommend continued medical management.  The patient  is to follow with Dr. Diona Browner in 1-2 weeks in the [place] office.  No  medication changes will be made today.      Verne Carrow, MD  Electronically Signed     CM/MEDQ  D:  03/10/2009  T:  03/11/2009  Job:  [id number]   cc:   Charleston Poot, MD

## 2011-03-26 NOTE — Assessment & Plan Note (Signed)
[facility]                          [facility] OFFICE NOTE   [name], [name]                         MRN:          [id number]  DATE:03/02/2009                            DOB:          07-Jun-1941    REFERRING PHYSICIAN:  Alton [name]   REQUESTING PHYSICIAN:  Dr. Alton [name].   REASON FOR CONSULTATION:  Shortness of breath, chest pain, and abnormal  myocardial perfusion study.   HISTORY OF PRESENT ILLNESS:  [name] is a pleasant 70 year old woman  with a chronic history of hypertension, hyperlipidemia, and type 2  diabetes mellitus.  She also reports a history of rheumatic fever with  mitral valve disease (presumably regurgitation based on available  information) followed by echocardiography last in 2008.  She reports  chronic problems over the last several years with palpitations, actually  initially dating back approximately 20-30 years.  She describes these as  an intermittent feeling of fluttering in the left side of her chest,  sometimes lasting only seconds, but other times being more prolonged up  to hours at a time.  Unrelated to this, she has experienced progressive  shortness of breath with exertion over the last several months, also  associated with an occasional aching in her chest.  She has had this  chest discomfort both with exertion and sometimes at rest, usually  lasting for a few minutes.  The symptoms are of moderate intensity.  She  was referred by [name] for further testing including a resting  electrocardiogram which I reviewed today showing sinus rhythm with  anterolateral T-wave inversions, counterclockwise rotation, and leftward  axis with nonspecific interventricular conduction delay (QRS duration  102 milliseconds).  She underwent a dobutamine thallium study through  [facility] on April 6 which did not reveal diagnostic ST-  segment changes.  Perfusion imaging was suggestive of inferior wall  ischemia with an overall calculated left ventricular ejection fraction  of 68%.  [name] reviewed these results with the patient and referred  her today to discuss the possibility of proceeding with a diagnostic  cardiac catheterization to clearly outline her coronary anatomy.  Today  we reviewed the risk/benefit profile of a diagnostic cardiac  catheterization.  The symptoms described above do persist.  She is  interested in proceeding with further evaluation.   ALLERGIES:  STADOL.   PRESENT MEDICATIONS:  1. Caltrate with vitamin D 600 mg p.o. daily.  2. Femara 2.5 mg p.o. daily.  3. Potassium 10 mEq p.o. b.i.d.  4. Folic acid 400 mcg p.o. daily.  5. Bumetanide 1 mg p.o. q.a.m.  6. Levothyroxine 150 mcg p.o. daily.  7. Zolpidem 10 mg p.o. q.h.s.  8. Diovan 160 mg p.o. q.a.m.  9. Amitriptyline 75 mg 2 tablets p.o. q.h.s.  10.Nexium 40 mg p.o. q.p.m.  11.Simvastatin 80 mg p.o. q.h.s.  12.Enteric-coated aspirin 81 mg p.o. daily.  13.Bisoprolol/hydrochlorothiazide 5/6.25 mg p.o. q.a.m.  14.Centrum Silver 1 p.o. daily.  15.Metformin 500 mg p.o. q.a.m. and 1000 mg p.o. q.p.m.  16.Nitroglycerin spray p.r.n.  17.Hydrocodone/APAP 7.5/650 mg p.o. t.i.d. p.r.n.   PAST MEDICAL HISTORY:  As detailed above.  Other pertinent findings  include a history of breast cancer status post left mastectomy in 2008  with subsequent treatment with chemotherapy and radiation therapy.  She  states that this has been in remission.  She also has a remote history  of gastric ulcer and gastrointestinal hemorrhage status post partial  gastrectomy back in the late 1970s.  She reports no major problems with  recurrent gastrointestinal bleeding.  She has been able to tolerate  taking coated baby aspirin recently and also remains on proton pump  inhibitor therapy.  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  She had a D and C and tubal ligation at age  70.   SOCIAL HISTORY:   The patient is married.  She has 2 children, both  described as being in good health.  She has a high school education.  She is not working at this time.  She denies any tobacco or alcohol use  history.  She reports eating a low-salt, low-fat diet.   FAMILY HISTORY:  Reviewed.  The patient's mother died at age 77 of colon  cancer and heart disease.  The patient's father died at age 11.  She has  2 brothers living at age 32 and 63 with no history of heart disease and  1 sister living age 36 with no history of heart disease.  She does have  an aunt who died at age 22 with a heart attack and an uncle who died at  age 86 with a heart attack.   REVIEW OF SYSTEMS:  The patient states that her memory has not been as  good as usual over the last few years.  She has had cataracts,  occasional headaches, fatigue, history of dental caries, reportedly  remote history of hepatitis A when she was 14, no active  gastrointestinal bleeding problems or change in appetite.  Otherwise  systems reviewed and negative.   PHYSICAL EXAMINATION:  VITAL SIGNS:  On examination, blood pressure is  133/72, heart rate is 69 and regular, weight is 167 pounds, height 5  feet 3 inches tall.  GENERAL:  This is an overweight woman in no acute distress.  HEENT:  Conjunctivae is normal.  Pharynx is clear.  Poor dentition.  NECK:  Supple.  No elevated jugular venous pressure.  No loud carotid  bruits.  No thyromegaly.  LUNGS:  Clear breathing at rest.  CARDIAC:  Reveals a regular rate and rhythm.  Soft S4.  Mid systolic  murmur heard best at the base.  Second heart sound is preserved.  No  obvious diastolic murmur or pericardial rub.  No S3 gallop.  ABDOMEN:  Soft, normoactive bowel sounds.  Liver edge nonpalpable.  No  guarding or tenderness.  No obvious bruits.  EXTREMITIES:  Exhibit no frank pitting edema, there is trace ankle edema  that is symmetrical.  Distal pulses are 1 to 2+.  SKIN:  Warm and dry.   MUSCULOSKELETAL:  No kyphosis is noted.  NEUROPSYCHIATRIC:  The patient is alert x3.  Affect seems appropriate.   LABORATORY DATA:  Laboratory data from the 13th of April shows a total  cholesterol of 139, LDL cholesterol 52, HDL cholesterol 33,  triglycerides 268, TSH 0.512.   IMPRESSION AND RECOMMENDATIONS:  1. Progressive history of shortness of breath with exertion associated      with occasional chest aching over the last several months.      Cardiac risk  factors include longstanding hypertension,      hyperlipidemia, and diabetes mellitus.  She has had breast cancer      status post prior left mastectomy in 2008 and did receive some      subsequent chest radiation as well.  Recent screening dobutamine      thallium study is abnormal with report indicating the possibility      of inferior wall ischemia with an ejection fraction of 68%.  Her      resting electrocardiogram is also abnormal showing anterolateral T-      wave inversions with counterclockwise rotation and left axis      deviation.  We have reviewed the risk/benefit profile for      proceeding with a diagnostic cardiac catheterization to best      outline her coronary anatomy and assess for the potential for      revascularization strategies.  She is in agreement to proceed and      this will be scheduled over the next week as an outpatient.  No      specific medication changes will be made today.  She is already on      aspirin, high-dose statin therapy, beta blocker with diuretic, and      has p.r.n. nitroglycerin spray.  Metformin will need to be held in      standard fashion prior to her angiogram and repeat baseline labs      with chest x-ray will be obtained.  2. Reported history of rheumatic fever with some degree of mitral      valve disease, presumably mitral regurgitation based on her records      and history.  She also has a history of recurrent palpitations.      Her last echocardiogram was in 2008.  We will  plan to repeat this,      particularly given her shortness of breath on exertion.  She does      have a mid systolic murmur on examination but no loud diastolic      murmur.  3. Further plans to follow.     Jonelle Sidle, MD  Electronically Signed    SGM/MedQ  DD: 03/02/2009  DT: 03/02/2009  Job #: [id number]   cc:   [name]

## 2011-03-26 NOTE — Cardiovascular Report (Signed)
[name], ELLISTON NO.:  [id number]   MEDICAL RECORD NO.:  [id number]          PATIENT TYPE:  OIB   LOCATION:  1965                         FACILITY:  [facility]   PHYSICIAN:  Verne Carrow, MDDATE OF BIRTH:  1940/12/23   DATE OF PROCEDURE:  03/10/2009  DATE OF DISCHARGE:  03/10/2009                            CARDIAC CATHETERIZATION   ADDENDUM   The patient was taken out of the catheterization laboratory to the  holding area after her diagnostic left heart catheterization.  We then  discussed the case with her referring physician Dr. Diona Browner and he felt  that a right heart catheterization was also indicated.  The patient was  brought back into the catheterization laboratory and venous access was  obtained in the left femoral vein.  A 7-French sheath was placed after  1% lidocaine was used for local anesthesia.  A Swan-Ganz catheter was  used to obtain intracardiac as well as intrapulmonary pressures.  The  right atrial pressure was 5, right ventricular pressure 91/5, with the  right ventricular end-diastolic pressure 12.  Pulmonary artery pressure  was 86/29, with a mean of 57; pulmonary capillary wedge pressure mean  was 21-23; and cardiac output 3.9 L/min (by Fick method).  Cardiac index  2.2 L/min/m2, pulmonary artery saturation 53%, and central aortic  saturation 87%.   The patient tolerated this procedure well and was taken to the holding  area in stable condition.   PLAN:  The patient will be admitted to [facility] to the  telemetry unit for further monitoring.  I have discussed the case with  Dr. Diona Browner, the referring physician, and we would like to admit her  and get a chest CT tomorrow to rule out thromboembolic etiology of her  pulmonary hypertension.  The lung parenchyma can also be assessed with a  CT of the chest.  The patient does have a history of radiation therapy  for breast cancer.  We cannot fully explain her significantly  elevated  pulmonary artery pressures based on her valvular disease or other  cardiac issues currently.  I would also like to get a pulmonary  consultation once she is admitted.  We may consider performing a  transesophageal echocardiogram next week to better evaluate the  pathology of her valves.  Her tracings are not consistent with severe  mitral regurgitation.  Once again this case discussed with her referring  physician Dr. Diona Browner and he would like to have the patient admitted  for further workup.      Verne Carrow, MD  Electronically Signed     CM/MEDQ  D:  03/10/2009  T:  03/11/2009  Job:  [id number]

## 2011-03-26 NOTE — Assessment & Plan Note (Signed)
[facility]                          [name] OFFICE NOTE   [name], [name]                         MRN:          [id number]  DATE:04/03/2009                            DOB:          [birth year]    PRIMARY CARDIOLOGIST:  Jonelle Sidle, MD   REASON FOR VISIT:  Post-hospital followup.   [name] returns to our clinic after undergoing recent right/left  cardiac catheterization at [facility].  She was also briefly  admitted during this time, for further evaluation and recommendations  regarding severe pulmonary hypertension.   [name] was initially referred to Dr. Diona Browner here recently, per  Dr. Darius Bump, for evaluation of shortness of breath and chest pain, in the  setting of an abnormal stress imaging study.  She presented with no  known history of coronary artery disease, but with several cardiac risk  factors, including diabetes mellitus.   Initial workup consisted of a 2-D echocardiogram, reviewed by Dr.  Diona Browner, which suggested severe pulmonary hypertension (RVSP near 100  mmHg), with normal left ventricular function and normal right  ventricular function.  Based on this result, Dr. Diona Browner suggested a  diagnostic right/left cardiac catheterization, for better hemodynamic  assessment.   The left catheterization yielded mild nonobstructive CAD with, at most,  30% ostial LAD disease.  There was mild/moderate MR and preserved LVF  (EF 55%).   Right heart catheterization was notable for an RVSP of 91 and an RVEDP  of 12, with a PASP of 86.   The patient was referred for a formal pulmonary evaluation, and has been  seen in followup.  She was discharged on home oxygen and is due to  return next month for formal pulmonary function tests, with Dr. Levy Pupa.   Chest CT scan suggested diffuse, ground-glass opacifications, mostly  suggestive for pulmonary edema.  There was no evidence of pulmonary  emboli.  There  was some suggestion that the left upper lung interstitial  opacities could represent fibrosis from previous radiation exposure,  given her history of breast cancer.  There was diffuse mediastinal  lymphadenopathy, as well.  The patient does followup with her  oncologist, Dr. Tobie Poet.   Clinically, [name] suggests less shortness of breath since being  placed on home oxygen.  She also notes some improvement in her lower  extremity edema, since her medication was adjusted with uptitration of  bisoprolol and hydrochlorothiazide to 25 mg of the HCTZ component.  Of  note, she has not had any post-hospital labs.  Her potassium was 3.3 at  time of discharge.   CURRENT MEDICATIONS:  1. Ziac 5/25 mg daily.  2. Potassium 10 b.i.d.  3. Bumetanide 1 mg daily.  4. Levothyroxine 0.15 mg daily.  5. Femara 2.5 daily.  6. Zolpidem 10 nightly.  7. Diovan 160 daily.  8. Amitriptyline 150 nightly.  9. Nexium 40 daily.  10.Simvastatin 80 daily.  11.Aspirin 81 daily.  12.Metformin 500 q.a.m./1000 q.p.m.   PHYSICAL EXAMINATION:  VITAL SIGNS:  Blood pressure is 128/64, pulse is  68, regular, and weight was 165 (  down from 200).  GENERAL:  A 70 year old female, moderately obese, sitting upright, no  distress.  HEENT:  Nasal cannula in place.  LUNGS:  Clear to auscultation in all fields.  HEART:  Regular rate and rhythm.  A soft grade 2/6 holosystolic murmur  at the base.  No rubs.  ABDOMEN:  Soft, nontender, intact bowel sounds.  EXTREMITIES:  Palpable bilateral femoral pulses with associated bruit;  no hematoma, ecchymosis ; no hematoma or ecchymosis.  Minimally palpable  peripheral pulses with trace edema.  NEUROLOGIC:  No focal deficit.   IMPRESSION:  1. Severe pulmonary hypertension.      a.     On continuous home oxygen.  2. Nonobstructive coronary artery disease.  3. Preserved left ventricular function.  4. Moderate mitral regurgitation.  5. Hypertension, well-controlled.  6.  Type 2 diabetes mellitus.  7. Dyslipidemia.  8. Hypokalemia.  9. Bilateral femoral bruits.   PLAN:  1. Schedule a followup ultrasound of both groins to exclude      pseudoaneurysm/atrioventricular fistula.  I suspect her bruits most      likely represent underlying peripheral arterial disease, in the      absence of any physical findings suggestive of persistent bleeding.      If this study is negative, then the patient could be followed up      closely with conservative management and monitoring of symptoms.  2. Follow up blood work with a metabolic profile, magnesium level, and      CBC level.  Subsequent follow up will be deferred to her primary      care physician, Dr. Darius Bump, given that she is on a diuretic with      recent evidence of hypokalemia.  3. The patient strongly advised to maintain regular followup with her      pulmonologist, Dr. Delton Coombes, [facility], as scheduled.  She is due to      return for formal pulmonary function tests      next month.  4. Schedule return clinic visit with myself and Dr. Diona Browner in 6      months.      Rozell Searing, PA-C  Electronically Signed      Jonelle Sidle, MD  Electronically Signed   GS/MedQ  DD: 04/03/2009  DT: 04/04/2009  Job #: [id number]   cc:   Barbette Hair, MD

## 2011-04-12 ENCOUNTER — Other Ambulatory Visit: Payer: Self-pay | Admitting: Cardiology

## 2011-04-15 ENCOUNTER — Other Ambulatory Visit: Payer: Self-pay

## 2011-04-15 NOTE — Telephone Encounter (Signed)
**Note De-identified [name] Obfuscation** [place] [facility]. 

## 2011-04-15 NOTE — Telephone Encounter (Signed)
This is a duplicate message on a refill for HCTZ 25mg .

## 2011-07-04 ENCOUNTER — Telehealth: Payer: Self-pay | Admitting: *Deleted

## 2011-07-04 NOTE — Telephone Encounter (Signed)
Pt's dgt called regarding her mother. Pt was admitted to [facility] hospital yesterday. She was told she needs Tricuspid Valve repaired. She and family do not want this done at [facility]. She would like Dr. Diona Browner to handle this. Notified pt's dgt that a cardiovascular surgeon would need to make this repair and that we refer to [facility] to have that done. She is going to request that pt be transferred to [facility] for further management.

## 2011-07-09 ENCOUNTER — Encounter: Payer: Self-pay | Admitting: Cardiology

## 2011-07-10 ENCOUNTER — Ambulatory Visit (INDEPENDENT_AMBULATORY_CARE_PROVIDER_SITE_OTHER): Payer: Medicare Other | Admitting: Cardiology

## 2011-07-10 ENCOUNTER — Encounter: Payer: Self-pay | Admitting: Cardiology

## 2011-07-10 DIAGNOSIS — I2789 Other specified pulmonary heart diseases: Secondary | ICD-10-CM

## 2011-07-10 DIAGNOSIS — I2510 Atherosclerotic heart disease of native coronary artery without angina pectoris: Secondary | ICD-10-CM

## 2011-07-10 DIAGNOSIS — I498 Other specified cardiac arrhythmias: Secondary | ICD-10-CM

## 2011-07-10 DIAGNOSIS — I052 Rheumatic mitral stenosis with insufficiency: Secondary | ICD-10-CM

## 2011-07-10 DIAGNOSIS — I471 Supraventricular tachycardia: Secondary | ICD-10-CM | POA: Insufficient documentation

## 2011-07-10 NOTE — Assessment & Plan Note (Signed)
Nonobstructive by prior cardiac catheterization. Doubt that this has progressed significantly. Notably, recent troponins were normal despite rapid SVT and hemodynamic instability.

## 2011-07-10 NOTE — Assessment & Plan Note (Signed)
Severe, on chronic oxygen, did not tolerate Revatio related to lower extremity edema. Again, need to understand the contribution from the patient's mitral valvular disease, and consider other potential medical treatments as well.

## 2011-07-10 NOTE — Assessment & Plan Note (Signed)
Recently documented during hospital stay in [place]. 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 Also supported by the fact that adenosine was effective. She is on beta blocker therapy now in the form of Lopressor, and is symptomatically stable. Given her relatively hemodynamic instability with this rhythm however, and poor reserve in light of her valvular heart disease and pulmonary hypertension, I suspect she will ultimately need to be considered for ablation. We can have her seen by EP following her assessment by Dr. Gala Romney.

## 2011-07-10 NOTE — Patient Instructions (Signed)
Your physician recommends that you schedule a follow-up appointment in: we will call you and let you know

## 2011-07-10 NOTE — Progress Notes (Signed)
Clinical Summary [name] is a 70 y.o.female presenting for followup. I saw her back in April. At that time I had recommended that she followup with cardiac monitoring to exclude any paroxysmal arrhythmias, with interval reported history of stroke in [name], and known history of valvular heart disease with severe pulmonary hypertension. Main concern was atrial fibrillation at that time. She was to have this arranged by her primary care provider, however reportedly it did not occur.  Interval history is reviewed. She was recently admitted to [name] just last week, having presented with palpitations and profound dizziness, shortness of breath. She was noted to be in supraventricular tachycardia, heart rates around 180 beats per minute, requiring adenosine and Cardizem, subsequently controlled on beta blocker therapy. She tells me that she was seen by a cardiologist that suggested she undergo ablation in [name], and she did not want to proceed at that time.  Importantly, she also had a followup echocardiogram. The report indicates LVEF of 55% without regional wall motion abnormalities (troponins were normal), normal RV size and function, moderately dilated left atrium, mildly dilated right atrium, and thickened mitral leaflets with restriction of both leaflets suggesting mild to moderate mitral stenosis and also associated with moderate to severe mitral regurgitation. Only mild tricuspid regurgitation was described, however RVSP was 70 mmHg consistent with severe pulmonary hypertension as before. There was at least mild pulmonic regurgitation.  I reviewed the available records with the patient and her daughter. Fortunately, [name] seems to be relatively stable in terms of her shortness of breath, wears chronic oxygen, and has had no further palpitations since discharge. I reviewed the implications of her recent echocardiogram report, specifically as it relates to the relative  importance of her mitral valvular disease and the whole picture. Her previous cardiac catheterization report was reviewed as well.  Patient last saw Dr. Delton [name] for her pulmonary hypertension back in December 2011. She was unable to tolerate Revatio previously related to lower extremity edema.  I reviewed the patient's recent testing and symptomatology with Dr. Gala [name], with plan to have her seen soon for further evaluation. I suspect that she will need a TEE and ultimately right heart catheterization to better understand her present condition, and assess for additional treatment options. She may well also require an SVT ablation in light of her hemodynamic instability.   Allergies  Allergen Reactions  . Butorphanol Tartrate     REACTION: migraines, nervous/agitated  . Lipitor (Atorvastatin Calcium) Diarrhea    nightmares  . Revatio (Sildenafil Citrate) Swelling    Medication list reviewed.  Past Medical History  Diagnosis Date  . Pulmonary hypertension     s/p RHC 4/10 - PAP 86/29, wedge 21-23  . Squamous cell carcinoma     Left arm  . Gastric ulcer with hemorrhage   . Rheumatic fever     Childhood  . Breast cancer     2008 s/p chemo and radiation (last tx 4/09) s/p left mastectomy s/p 9 lymph node resection - [name], [name]  . Coronary artery disease     Nonobstructive, LVEF 55%  . Mitral regurgitation     Moderate  . 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 Stroke     12/11 [name]    Past Surgical History  Procedure Date  . Left mastectomy   . Partial gastrectomy     Family History  Problem Relation Age of Onset  . Heart disease Mother   . Colon cancer Mother  Social History [name] reports that she has never smoked. She has never used smokeless tobacco. [name] reports that she does not drink alcohol.  Review of Systems As outlined above. Reports some trouble with memory. No frank syncope. No reported hemoptysis. No  recent lower extremity edema. No orthopnea. Otherwise negative.  Physical Examination Filed Vitals:   07/10/11 1529  BP: 137/71  Pulse: 78  Resp: 18   Overweight woman in no acute distress wearing oxygen.  HEENT: Conjunctiva and lids normal, oropharynx with moist mucosa.  Neck: Supple, no bruits, Prominent JV waves.  Lungs: Clear to auscultation, diminished but nonlabored. No wheezes.  Cardiac: Regular rate and rhythm, somewhat prominent P2, 2/6 systolic murmur at the apex, no S3.  Abdomen: Protuberant, nontender, bowel sounds present.  Extremities: Erythema distally in pattern of venous stasis with trace edema below the knees bilaterally, distal pulses one plus.  Skin: Warm and dry.  Musculoskeletal: No kyphosis.  Neuropsychiatric: Alert oriented x3, and grossly appropriate.   ECG Baseline ECG in sinus rhythm shows anteroseptal T wave inversions, inferior T-wave inversions, increased voltage, leftward axis.   Problem List and Plan

## 2011-07-10 NOTE — Assessment & Plan Note (Signed)
Recent echocardiogram report from [place] suggests the possibility of mild to moderate mitral stenosis, with moderate to severe mitral regurgitation. Better understanding the significance of this lesion as it relates to her pulmonary hypertension is necessary. As noted above, I discussed the scenario with Dr. Gala Romney, and plan will be to have the patient present for clinical assessment over the next week, at which time she will have a surface echocardiogram for review. I suspect she will ultimately need a TEE and right heart catheterization to better understand her hemodynamics and mitral valve structure.

## 2011-07-18 ENCOUNTER — Ambulatory Visit (HOSPITAL_COMMUNITY)
Admission: RE | Admit: 2011-07-18 | Discharge: 2011-07-18 | Disposition: A | Discharge status: Home / Self Care | Payer: Medicare Other | Source: Ambulatory Visit | Attending: Internal Medicine | Admitting: Internal Medicine

## 2011-07-18 ENCOUNTER — Other Ambulatory Visit (HOSPITAL_COMMUNITY): Payer: Medicare Other

## 2011-07-18 VITALS — BP 138/66 | HR 79 | Wt 162.0 lb

## 2011-07-18 DIAGNOSIS — I369 Nonrheumatic tricuspid valve disorder, unspecified: Secondary | ICD-10-CM

## 2011-07-18 DIAGNOSIS — I2789 Other specified pulmonary heart diseases: Secondary | ICD-10-CM | POA: Insufficient documentation

## 2011-07-18 MED ORDER — FUROSEMIDE 20 MG PO TABS: 20.0000 mg | ORAL_TABLET | Freq: Every day | ORAL | Status: DC

## 2011-07-18 NOTE — Patient Instructions (Signed)
Stop HCTZ Start Furosemide 20 mg daily  Your physician has requested that you have a TEE. During a TEE, sound waves are used to create images of your heart. It provides your doctor with information about the size and shape of your heart and how well your heart's chambers and valves are working. In this test, a transducer is attached to the end of a flexible tube that's guided down your throat and into your esophagus (the tube leading from you mouth to your stomach) to get a more detailed image of your heart. You are not awake for the procedure. Please see the instruction sheet given to you today. For further information please visit [email or web address]  Your physician has requested that you have a cardiac catheterization. Cardiac catheterization is used to diagnose and/or treat various heart conditions. Doctors may recommend this procedure for a number of different reasons. The most common reason is to evaluate chest pain. Chest pain can be a symptom of coronary artery disease (CAD), and cardiac catheterization can show whether plaque is narrowing or blocking your heart's arteries. This procedure is also used to evaluate the valves, as well as measure the blood flow and oxygen levels in different parts of your heart. For further information please visit [email or web address] Please follow instruction sheet, as given.  Your physician recommends that you schedule a follow-up appointment in: 1 month

## 2011-07-18 NOTE — Progress Notes (Signed)
Clinical Summary  [name] is a 70 y.o.female with h/o DM2, HTN, L breast CA s/p mastectomy/chemo/XRT 8/08, PUD s/p partial gastrectomy, rheumatic fever, CVA with transient R sided weakness 12/11 and pulmonary HTN referred by Dr. Diona Browner for further evaluation of PH.  Underwent cath in 4/10. Normal cors. RA 5 RV 91/5/12 PA 86/29 (57) PCWP 22 CI 2.2. CT no PE. Saw Dr. Delton Coombes in 5/10 and was on Revatio with no improvement Stopped after about 10 months due to severe leg pains and swelling. Did not try other agent. Has been on O2 due to hypoxemia. CT without PE.   She was recently admitted to [facility] in Aug 2012, with palpitations and profound dizziness, shortness of breath. She was noted to be in supraventricular tachycardia, heart rates around 180 beats per minute, requiring adenosine and Cardizem, subsequently controlled on beta blocker therapy. She tells me that she was seen by a cardiologist that suggested she undergo ablation in [employer], and she did not want to proceed at that time. Started lopressor.  LVEF of 55% without regional wall motion abnormalities (troponins were normal), normal RV size and function, moderately dilated left atrium, mildly dilated right atrium, and thickened mitral leaflets with restriction of both leaflets suggesting mild to moderate mitral stenosis and also associated with moderate to severe mitral regurgitation. Only mild tricuspid regurgitation was described, however RVSP was 70 mmHg consistent with severe pulmonary hypertension as before. There was at least mild pulmonic regurgitation. Referred for further evaluation.   Echo today EF 55% with markedly elevated filling pressures e/e' 31. Mod to severe mitral stenosis with severely restricted posterior leaflet. (mean gradient 10, MVA 1.29 cm2). LA moderately dilated. RV normal size and function. Severe TR RVSP 70+ mmHg.  Can do all ADls without too much problem. If goes to [facility] can walk around whole  store slowly without stopping with O2. But exhausted the next day. Mild edema. No dizziness.   I reviewed the patient's recent testing and symptomatology with Dr. Gala Romney, with plan to have her seen soon for further evaluation. I suspect that she will need a TEE and ultimately right heart catheterization to better understand her present condition, and assess for additional treatment options. She may well also require an SVT ablation in light of her hemodynamic instability.   Allergies  Allergen Reactions  . Butorphanol Tartrate     REACTION: migraines, nervous/agitated  . Lipitor (Atorvastatin Calcium) Diarrhea    nightmares  . Revatio (Sildenafil Citrate) Swelling    Medication list reviewed.  Past Medical History  Diagnosis Date  . Pulmonary hypertension     s/p RHC 4/10 - PAP 86/29, wedge 21-23  . Squamous cell carcinoma     Left arm  . Gastric ulcer with hemorrhage   . Rheumatic fever     Childhood  . Breast cancer     2008 s/p chemo and radiation (last tx 4/09) s/p left mastectomy s/p 9 lymph node resection - [name], [facility]  . Coronary artery disease     Nonobstructive, LVEF 55%  . Mitral regurgitation     Moderate  . 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 Stroke     12/11 [facility]    Past Surgical History  Procedure Date  . Left mastectomy   . Partial gastrectomy     Family History  Problem Relation Age of Onset  . Heart disease Mother   . Colon cancer Mother     Social History [name] reports  that she has never smoked. She has never used smokeless tobacco. [name] reports that she does not drink alcohol.  Review of Systems As outlined above. Reports some trouble with memory. No frank syncope. No reported hemoptysis. No recent lower extremity edema. No orthopnea. Otherwise negative.  Physical Examination Filed Vitals:   07/18/11 1619  BP: 138/66  Pulse: 79   Overweight woman in no acute distress wearing oxygen.    HEENT: Conjunctiva and lids normal, oropharynx with moist mucosa.  Neck: Supple, no bruits, CVP 7-8 Lungs: Clear to auscultation, diminished but nonlabored. No wheezes.  Cardiac: Regular rate and rhythm, 2/6 TR very prominent P2, soft diastolic rumble at apex. No RV lift Abdomen: Protuberant, mildly distended nontender, bowel sounds present.  Extremities:1+ edema below the knees bilaterally, distal pulses one plus.  Skin: Warm and dry.  Musculoskeletal: No kyphosis.  Neuropsychiatric: Alert oriented x3, and grossly appropriate.   ECG Baseline ECG in sinus rhythm shows anteroseptal T wave inversions, inferior T-wave inversions, increased voltage, leftward axis.   Problem List and Plan

## 2011-07-18 NOTE — Assessment & Plan Note (Addendum)
I suspect the main issue here is secondary PH due to moderate to severe mitral stenosis due to probable rheumatic heart disease. I think this has been compounded by longstanding elevation in L-sided pressures which has caused remodeling of her pulmonary artery bed with further elevation in her pulmonary pressures. Fortunately her RV function remains intact. Will plan TEE and repeat right and left heart cath to further assess. Change HCTZ to lasix 20 qd. We discussed possibility of MV-plasty vs MVR but this may be complicated by her severe PH. I discussed this with Dr. Diona Browner. Total time spent 65 minutes.

## 2011-07-25 ENCOUNTER — Encounter (HOSPITAL_COMMUNITY): Payer: Self-pay | Admitting: *Deleted

## 2011-08-01 ENCOUNTER — Telehealth (HOSPITAL_COMMUNITY): Payer: Self-pay | Admitting: *Deleted

## 2011-08-01 NOTE — Telephone Encounter (Signed)
Pt called this afternoon concerned about her cardiac cath for tomorrow, she states that she has a hard time swallowing, and that she will not be able to swallow the tubes.  She would like a call back.

## 2011-08-01 NOTE — Telephone Encounter (Signed)
Spoke w/pt, reassured her and will let Dr Gala Romney know

## 2011-08-02 ENCOUNTER — Inpatient Hospital Stay (HOSPITAL_BASED_OUTPATIENT_CLINIC_OR_DEPARTMENT_OTHER)
Admission: RE | Admit: 2011-08-02 | Discharge: 2011-08-02 | Disposition: A | Discharge status: Home / Self Care | Payer: Medicare Other | Source: Ambulatory Visit | Attending: Internal Medicine | Admitting: Internal Medicine

## 2011-08-02 ENCOUNTER — Ambulatory Visit (HOSPITAL_COMMUNITY)
Admission: RE | Admit: 2011-08-02 | Discharge: 2011-08-02 | Disposition: A | Discharge status: Home / Self Care | Payer: Medicare Other | Source: Ambulatory Visit | Attending: Internal Medicine | Admitting: Internal Medicine

## 2011-08-02 DIAGNOSIS — I059 Rheumatic mitral valve disease, unspecified: Secondary | ICD-10-CM

## 2011-08-02 DIAGNOSIS — I2510 Atherosclerotic heart disease of native coronary artery without angina pectoris: Secondary | ICD-10-CM | POA: Insufficient documentation

## 2011-08-02 DIAGNOSIS — R0989 Other specified symptoms and signs involving the circulatory and respiratory systems: Secondary | ICD-10-CM | POA: Insufficient documentation

## 2011-08-02 DIAGNOSIS — I052 Rheumatic mitral stenosis with insufficiency: Secondary | ICD-10-CM | POA: Insufficient documentation

## 2011-08-02 DIAGNOSIS — I079 Rheumatic tricuspid valve disease, unspecified: Secondary | ICD-10-CM | POA: Insufficient documentation

## 2011-08-02 DIAGNOSIS — R0609 Other forms of dyspnea: Secondary | ICD-10-CM | POA: Insufficient documentation

## 2011-08-02 DIAGNOSIS — I2789 Other specified pulmonary heart diseases: Secondary | ICD-10-CM | POA: Insufficient documentation

## 2011-08-02 LAB — POCT I-STAT 3, VENOUS BLOOD GAS (G3P V)
Acid-base deficit: 1 mmol/L (ref 0.0–2.0)
Bicarbonate: 24 mEq/L (ref 20.0–24.0)
Bicarbonate: 25 mEq/L — ABNORMAL HIGH (ref 20.0–24.0)
TCO2: 25 mmol/L (ref 0–100)
pCO2, Ven: 48.1 mmHg (ref 45.0–50.0)
pH, Ven: 7.307 — ABNORMAL HIGH (ref 7.250–7.300)
pO2, Ven: 38 mmHg (ref 30.0–45.0)

## 2011-08-02 LAB — POCT I-STAT 3, ART BLOOD GAS (G3+)
Bicarbonate: 24.1 mEq/L — ABNORMAL HIGH (ref 20.0–24.0)
pCO2 arterial: 40.9 mmHg (ref 35.0–45.0)
pH, Arterial: 7.378 (ref 7.350–7.400)
pO2, Arterial: 102 mmHg — ABNORMAL HIGH (ref 80.0–100.0)

## 2011-08-02 LAB — BASIC METABOLIC PANEL
Chloride: 102 mEq/L (ref 96–112)
GFR calc Af Amer: >60 mL/min (ref >60)
Potassium: 4.3 mEq/L (ref 3.5–5.1)
Sodium: 139 mEq/L (ref 135–145)

## 2011-08-02 LAB — PROTIME-INR: INR: 1.1 (ref 0.00–1.49)

## 2011-08-02 LAB — CBC
HCT: 32.4 % — ABNORMAL LOW (ref 36.0–46.0)
Hemoglobin: 10.3 g/dL — ABNORMAL LOW (ref 12.0–15.0)
WBC: 10 10*3/uL (ref 4.0–10.5)

## 2011-08-02 LAB — APTT: aPTT: 28 seconds (ref 24–37)

## 2011-08-07 ENCOUNTER — Telehealth: Payer: Self-pay | Admitting: Internal Medicine

## 2011-08-07 NOTE — Telephone Encounter (Signed)
PT AWARE SOME BRUISING IS  NORMAL INSTRUCTED IF DEVELOPS  RAISED AREA(KNOT), PAIN , HOT TO TOUCH OR ANY  DRAINAGE TO CALL  PT VERBALIZED UNDERSTANDING HAS  APPT  WITH DR BENSIMHON ON 08-19-11 AT 3:15 PM.PT ALSO IS REQUESTING 02 SAT TO BE  CHECKED  SO MAY BE  EVALUATED TO CONT WITH HOME O2.

## 2011-08-07 NOTE — Telephone Encounter (Signed)
Pt had procedure 9-21 ans now has a 12 inch bruise, pls advise

## 2011-08-19 ENCOUNTER — Ambulatory Visit (HOSPITAL_COMMUNITY)
Admission: RE | Admit: 2011-08-19 | Discharge: 2011-08-19 | Disposition: A | Discharge status: Home / Self Care | Payer: Medicare Other | Source: Ambulatory Visit | Attending: Internal Medicine | Admitting: Internal Medicine

## 2011-08-19 VITALS — BP 124/76 | HR 90 | Wt 166.5 lb

## 2011-08-19 DIAGNOSIS — I052 Rheumatic mitral stenosis with insufficiency: Secondary | ICD-10-CM

## 2011-08-19 DIAGNOSIS — I059 Rheumatic mitral valve disease, unspecified: Secondary | ICD-10-CM | POA: Insufficient documentation

## 2011-08-19 NOTE — Progress Notes (Signed)
Clinical Summary  [name] is a 70 y.o.female with h/o DM2, HTN, L breast CA s/p mastectomy/chemo/XRT 8/08, PUD s/p partial gastrectomy, rheumatic fever, CVA with transient R sided weakness 12/11 and pulmonary HTN referred by Dr. Diona Browner for further evaluation of PH.  Underwent cath in 4/10. Normal cors. RA 5 RV 91/5/12 PA 86/29 (57) PCWP 22 CI 2.2. CT no PE. Saw Dr. Delton Coombes in 5/10 and was on Revatio with no improvement Stopped after about 10 months due to severe leg pains and swelling. Did not try other agent. Has been on O2 due to hypoxemia. CT without PE.   She was recently admitted to [facility] in Aug 2012, with palpitations and profound dizziness, shortness of breath. She was noted to be in supraventricular tachycardia, heart rates around 180 beats per minute, requiring adenosine and Cardizem, subsequently controlled on beta blocker therapy. She tells me that she was seen by a cardiologist that suggested she undergo ablation in [place], and she did not want to proceed at that time. Started lopressor.  LVEF of 55% without regional wall motion abnormalities (troponins were normal), normal RV size and function, moderately dilated left atrium, mildly dilated right atrium, and thickened mitral leaflets with restriction of both leaflets suggesting mild to moderate mitral stenosis and also associated with moderate to severe mitral regurgitation. Only mild tricuspid regurgitation was described, however RVSP was 70 mmHg consistent with severe pulmonary hypertension as before. There was at least mild pulmonic regurgitation. Referred for further evaluation.   Echo 8/12 EF 55% with markedly elevated filling pressures e/e' 31. Mod to severe mitral stenosis with severely restricted posterior leaflet. (mean gradient 10, MVA 1.29 cm2). LA moderately dilated. RV normal size and function. Severe TR RVSP 70+ mmHg.  Had TEE and cath recently. TEE showed EF 55%. RV was ok. Mild to moderate MS and  moderate MR. Severe TR. Cath showed minimal CAD. But suggested severe MS and significant MR with related PH. Right atrial pressure mean of 8, RV pressure of 72/6, with EDP of 13, PA pressure 75/26 with a mean of 46.  Pulmonary capillary wedge pressure, mean of 20 with V-waves in the 35 to 40 range. Fick cardiac output 5.2.  Cardiac index 2.7.  Pulmonary vascular resistance was 4.3 Woods units.  Mitral valve area was calculated at 1.32 sq cm.  Mean valve gradient was 14.4 mmHg.  Saturations, aortic sat was 98% and pulmonary artery saturation was 63% and 68%.   Feels ok. Still dyspneic with mild to moderate activity. No orthopnea or PND.   Allergies  Allergen Reactions  . Butorphanol Tartrate     REACTION: migraines, nervous/agitated  . Lipitor (Atorvastatin Calcium) Diarrhea    nightmares  . Revatio (Sildenafil Citrate) Swelling    Medication list reviewed.  Past Medical History  Diagnosis Date  . Pulmonary hypertension     s/p RHC 4/10 - PAP 86/29, wedge 21-23  . Squamous cell carcinoma     Left arm  . Gastric ulcer with hemorrhage   . Rheumatic fever     Childhood  . Breast cancer     2008 s/p chemo and radiation (last tx 4/09) s/p left mastectomy s/p 9 lymph node resection - [name], [name]  . Coronary artery disease     Nonobstructive, LVEF 55%  . Mitral regurgitation     Moderate  . 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 Stroke     12/11 [facility]    Past Surgical History  Procedure  Date  . Left mastectomy   . Partial gastrectomy     Family History  Problem Relation Age of Onset  . Heart disease Mother   . Colon cancer Mother     Social History Ms. Pridgen reports that she has never smoked. She has never used smokeless tobacco. Ms. Forker reports that she does not drink alcohol.  Review of Systems As outlined above. Reports some trouble with memory. No frank syncope. No reported hemoptysis. No recent lower extremity edema. No  orthopnea. Otherwise negative.  Physical Examination Filed Vitals:   08/19/11 1545  BP: 124/76  Pulse: 90   Overweight woman in no acute distress wearing oxygen.  HEENT: Conjunctiva and lids normal, oropharynx with moist mucosa.  Neck: Supple, no bruits, CVP 7-8 Lungs: Clear to auscultation, diminished but nonlabored. No wheezes.  Cardiac: Regular rate and rhythm, 2/6 TR very prominent P2, soft diastolic rumble at apex. No RV lift Abdomen: Protuberant, mildly distended nontender, bowel sounds present.  Extremities:1+ edema below the knees bilaterally, distal pulses one plus.  Skin: Warm and dry.  Musculoskeletal: No kyphosis.  Neuropsychiatric: Alert oriented x3, and grossly appropriate.   ECG Baseline ECG in sinus rhythm shows anteroseptal T wave inversions, inferior T-wave inversions, increased voltage, leftward axis.   Problem List and Plan

## 2011-08-20 ENCOUNTER — Telehealth (HOSPITAL_COMMUNITY): Payer: Self-pay | Admitting: *Deleted

## 2011-08-20 DIAGNOSIS — R0902 Hypoxemia: Secondary | ICD-10-CM | POA: Insufficient documentation

## 2011-08-20 NOTE — Assessment & Plan Note (Addendum)
I have reviewed her studies with Dr. Cornelius Moras and we both feel strongly that she would benefit from MV replacement and TV ring. We will schedule her to see him this week or next. If needed, can consider milrinone tune-up prior to surgery however given her RV and PVR are relatively normal will likely tolerate surgery well. PH should nearly resolve with treatment of MV disease.  I went over her cath and TEE with both her and her daughter.

## 2011-08-20 NOTE — Telephone Encounter (Signed)
Received fax from [facility], Dr Gala Romney needs to document in his ov that pt needs O2, he did that in his 10/8 ov and that note was faxed to [facility] at [phone]

## 2011-08-20 NOTE — Assessment & Plan Note (Signed)
Continue home O2 for now. Will reassess need after surgery.

## 2011-08-21 ENCOUNTER — Ambulatory Visit: Payer: Medicare Other | Admitting: Cardiology

## 2011-08-25 NOTE — Cardiovascular Report (Signed)
  [name], [name] NO.:  [id number]  MEDICAL RECORD NO.:  [id number]  LOCATION:  [facility]                         FACILITY:  [facility]  PHYSICIAN:  Bevelyn Buckles. Matei Magnone, MDDATE OF BIRTH:  12/23/1940  DATE OF PROCEDURE: DATE OF DISCHARGE:  08/02/2011                           CARDIAC CATHETERIZATION   PATIENT IDENTIFICATION:  [name] is 70 year old woman with history of rheumatic fever.  She presented with dyspnea.  Echocardiogram showed mitral stenosis.  This had associated pulmonary hypertension.  She underwent TEE earlier today, which showed a moderately calcified valve with mild-to-moderate mitral stenosis.  The mitral valve gradient was about 1.8 sq cm with a mean gradient of about 8.  She was brought for catheterization today to further evaluate.  PROCEDURES PERFORMED: 1. Right heart cath. 2. Left heart cath. 3. Selective coronary angiography. 4. Left ventriculogram.  DESCRIPTION OF PROCEDURE:  The risks and indication were explained. Consent was signed and placed on the chart.  A 4-French arterial sheath and 7-French venous sheath were placed in the femoral vessels using modified Seldinger technique.  A Swan-Ganz catheter was used for right heart cath, JL4, 3-D RCA, and angled pigtail were used on the left side. No apparent complications.  HEMODYNAMICS:  Right atrial pressure mean of 8, RV pressure of 72/6, with EDP of 13, PA pressure 75/26 with a mean of 46.  Pulmonary capillary wedge pressure, mean of 20 with V-waves in the 35 to 40 range. Fick cardiac output 5.2.  Cardiac index 2.7.  Pulmonary vascular resistance was 4.3 Woods units.  Mitral valve area was calculated at 1.32 sq cm.  Mean valve gradient was 14.4 mmHg.  Saturations, aortic sat was 98% and pulmonary artery saturation was 63% and 68%.  Left main was normal.  LAD was a long vessel wrapping the apex.  It gave off a single diagonal branch.  There is a 20% lesion ostially.  Left  circumflex was a large dominant vessel.  There was a small ramus branch, two large marginals posterolateral and PDA was angiographically normal.  Right coronary artery was nondominant vessel, it was normal.  Left ventriculogram done in the RAO position showed EF of 55%.  No regional wall motion abnormalities.  Mitral regurgitation was not well- visualized.  ASSESSMENT: 1. Minimal coronary artery disease as described above. 2. Normal LV function. 3. Moderate-to-severe mitral stenosis. 4. Moderate mitral regurgitation. 5. Moderate pulmonary hypertension with both left and right-sided     components.  PLAN/DISCUSSION:  By catheterization, her mitral stenosis seems in a moderate-to-severe range, which is worse than we got on echocardiogram. Based on her pulmonary hypertension, I do think her mitral stenosis is quite significant.  She also has significant mitral regurgitation.  At this point, we will start Adcirca 10 mg a day and I will review with colleagues as well as Dr. Cornelius Moras regarding possibility of mitral valve repair. Bevelyn Buckles. Jaki Hammerschmidt, MD     DRB/MEDQ  D:  08/02/2011  T:  08/02/2011  Job:  [id number]  cc:   Jonelle Sidle, MD  Electronically Signed by Arvilla Meres MD on 08/25/2011 02:44:17 PM

## 2011-08-26 ENCOUNTER — Institutional Professional Consult (permissible substitution) (INDEPENDENT_AMBULATORY_CARE_PROVIDER_SITE_OTHER): Payer: Medicare Other | Admitting: Thoracic Surgery (Cardiothoracic Vascular Surgery)

## 2011-08-26 ENCOUNTER — Other Ambulatory Visit: Payer: Self-pay | Admitting: Thoracic Surgery (Cardiothoracic Vascular Surgery)

## 2011-08-26 ENCOUNTER — Encounter: Payer: Self-pay | Admitting: Thoracic Surgery (Cardiothoracic Vascular Surgery)

## 2011-08-26 ENCOUNTER — Encounter: Payer: Self-pay | Admitting: Cardiology

## 2011-08-26 VITALS — BP 121/63 | HR 86 | Resp 20 | Ht 62.0 in | Wt 165.0 lb

## 2011-08-26 DIAGNOSIS — I079 Rheumatic tricuspid valve disease, unspecified: Secondary | ICD-10-CM

## 2011-08-26 DIAGNOSIS — I071 Rheumatic tricuspid insufficiency: Secondary | ICD-10-CM | POA: Insufficient documentation

## 2011-08-26 DIAGNOSIS — I052 Rheumatic mitral stenosis with insufficiency: Secondary | ICD-10-CM

## 2011-08-26 DIAGNOSIS — I471 Supraventricular tachycardia, unspecified: Secondary | ICD-10-CM

## 2011-08-26 DIAGNOSIS — Z01818 Encounter for other preprocedural examination: Secondary | ICD-10-CM

## 2011-08-26 DIAGNOSIS — I7409 Other arterial embolism and thrombosis of abdominal aorta: Secondary | ICD-10-CM

## 2011-08-26 NOTE — Progress Notes (Signed)
PCP is ELLER,EDWARD, MD, MD Referring Provider is Bensimhon, Bevelyn Buckles, MD  Chief Complaint  Patient presents with  . 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 In 2010 she was evaluated and underwent cardiac catheterization revealing normal coronary artery anatomy. She was found to have pulmonary hypertension. She was sent for pulmonary medicine consultation and she has been followed since then by Dr. Delton Coombes. She is on home oxygen therapy. She reports that over the last several years she has had continued progression of symptoms of exertional shortness of breath. She has had occasional tachypalpitations. In December 2011 she was hospitalized in [place], [place] with a mini stroke or transient neurologic deficit. She was started on Plavix at that time. In August of 2012 she was again hospitalized in [place] with shortness of breath and tachypalpitations. She was told that she was having episodes of irregular heart rhythms and she was offered possible invasive ablation therapy. The patient declined and stated that she wanted to followup with her primary cardiologist. She was seen in followup and transthoracic echocardiogram was performed 07/18/2011. This revealed mitral stenosis and mitral regurgitation with normal left ventricular systolic function. There was severe tricuspid regurgitation. The patient subsequently underwent transesophageal echocardiogram on 08/02/2011. 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 There was normal left  ventricular size and function. There was severe tricuspid regurgitation. Left and right heart catheterization was also performed 08/02/2011. This confirmed the presence of moderate to severe mitral stenosis. There was pulmonary hypertension. There was normal coronary artery anatomy with no significant coronary artery disease. The patient has been referred to consider mitral valve replacement and tricuspid valve repair with or without a Maze procedure.   Past Medical History  Diagnosis Date  . Pulmonary hypertension     s/p RHC 4/10 - PAP 86/29, wedge 21-23  . Squamous cell carcinoma     Left arm  . Gastric ulcer with hemorrhage   . Rheumatic fever     Childhood  . Breast cancer     2008 s/p chemo and radiation (last tx 4/09) s/p left mastectomy s/p 9 lymph node resection - [name], [facility]  . Coronary artery disease     Nonobstructive, LVEF 55%  . Mitral regurgitation     Moderate  . 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 Stroke     12/11 [place]  . Mitral stenosis with insufficiency, rheumatic     Past Surgical History  Procedure Date  . Left mastectomy   . Partial gastrectomy     Family History  Problem Relation Age of Onset  . Heart disease Mother   . Colon cancer Mother     Social History History  Substance Use Topics  . Smoking status: Never Smoker   . Smokeless tobacco: Never Used  . Alcohol Use: No    Current Outpatient Prescriptions  Medication Sig Dispense Refill  . amitriptyline (ELAVIL) 75 MG tablet 2 tabs every evening       . clopidogrel (PLAVIX) 75 MG tablet Take 75 mg by mouth daily.        Marland Kitchen  cyanocobalamin (,VITAMIN B-12,) 1000 MCG/ML injection Inject 1,000 mcg into the muscle every 30 (thirty) days.        . diphenhydrAMINE (BENADRYL) 25 MG tablet Take 25 mg by mouth at bedtime.        . folic acid (FOLVITE) 1 MG tablet Take 2 tabs every morning      . furosemide (LASIX) 40 MG tablet Take 40 mg by mouth daily.        .  hydrocodone-acetaminophen (LORCET PLUS) 7.5-650 MG per tablet May take up to three times daily as needed      . letrozole (FEMARA) 2.5 MG tablet Take 2.5 mg by mouth daily.        Marland Kitchen levothyroxine (SYNTHROID, LEVOTHROID) 137 MCG tablet Take 137 mcg by mouth daily.        . metFORMIN (GLUCOPHAGE) 500 MG tablet Take one tab every morning & 2 tabs with supper      . metoprolol (LOPRESSOR) 50 MG tablet Take 1 tablet by mouth Twice daily.      . Multiple Vitamins-Minerals (CENTRUM SILVER PO) Take 1 tablet by mouth daily.        . nitroGLYCERIN (NITROSTAT) 0.4 MG SL tablet Place 0.4 mg under the tongue every 5 (five) minutes as needed.        . pantoprazole (PROTONIX) 40 MG tablet Take 40 mg by mouth daily.        . pirbuterol (MAXAIR) 200 MCG/INH inhaler Inhale 2 puffs into the lungs 4 (four) times daily.        . potassium chloride (KLOR-CON) 10 MEQ CR tablet Take 10 mEq by mouth. 2 tabs am 1 tab pm      . rosuvastatin (CRESTOR) 10 MG tablet Take 10 mg by mouth daily.        . valsartan (DIOVAN) 160 MG tablet Take 160 mg by mouth daily.        Marland Kitchen zolpidem (AMBIEN) 10 MG tablet Take 10 mg by mouth at bedtime as needed.          Allergies  Allergen Reactions  . Butorphanol Tartrate     REACTION: migraines, nervous/agitated  . Lipitor (Atorvastatin Calcium) Diarrhea    nightmares  . Revatio (Sildenafil Citrate) Swelling    Review of Systems  Constitutional: Positive for activity change, fatigue and unexpected weight change.       The patient complains of worsening fatigue. She has had decreased activity. Associated with this she has gained 10-15 pounds over the last 2 months.  HENT: Positive for congestion and postnasal drip.        She has not seen a dentist in several years and she has a broken tooth that she feels probably needs to be extracted.  Eyes: Negative.   Respiratory: Positive for shortness of breath and wheezing.   Cardiovascular: Positive for palpitations and leg swelling.  Negative for chest pain.       The patient describes a 2-3 year history of progressive symptoms of exertional shortness of breath. This has gotten particularly bad over the last several months such that now she gets short of breath with minimal physical activity. She denies orthopnea. She denies resting shortness of breath. She has not had exertional chest pain. She has had some occasional tachypalpitations without syncope. She has had mild lower extremity edema without significant abdominal bloating.  Gastrointestinal: Negative.   Genitourinary: Negative.   Musculoskeletal: Positive for back pain and arthralgias.       Diffuse chronic arthralgias and  myalgias particularly afflicting her neck shoulders back in her right knee. However, she ambulates without assistance and her primary limitation is shortness of breath.  Neurological: Negative.   Hematological: Negative.   Psychiatric/Behavioral: Negative.     BP 121/63  Pulse 86  Resp 20  Ht 5\' 2"  (1.575 m)  Wt 165 lb (74.844 kg)  BMI 30.18 kg/m2  SpO2 98% Physical Exam  Vitals reviewed. Constitutional: She is oriented to person, place, and time. She appears well-developed.  HENT:  Head: Normocephalic.  Eyes: Pupils are equal, round, and reactive to light.  Neck: Normal range of motion. No JVD present.  Cardiovascular: Normal rate and regular rhythm.   Murmur heard.      There is a soft systolic murmur heard best along the left sternal border  Abdominal: Soft. Bowel sounds are normal. She exhibits no distension and no mass.  Musculoskeletal: Normal range of motion. She exhibits no edema.  Lymphadenopathy:    She has no cervical adenopathy.  Neurological: She is alert and oriented to person, place, and time.  Skin: Skin is warm and dry.  Psychiatric: She has a normal mood and affect. Her behavior is normal. Judgment and thought content normal.     Diagnostic Tests:  Transesophageal echocardiogram performed 08/02/2011 is reviewed.  This reveals findings consistent with rheumatic mitral valve disease. There is at least moderate mitral stenosis with valve area estimated 1.8 centimeters squared. There is at least moderate mitral regurgitation. The leaflets of the mitral valve are severely restricted during both systole and diastole. This corresponds to type IIIA mitral valve dysfunction as would be expected for rheumatic disease. The degree of mitral regurgitation is bad enough that balloon valvuloplasty probably would be unwise. Left ventricular size and function appears normal. There is severe left atrial enlargement. There is trace aortic insufficiency. There is severe tricuspid regurgitation. There is moderate right ventricular chamber enlargement. The dysfunction of the tricuspid valve appears to be purely functional, type I dysfunction, although the leaflets of the valve are not well visualized.  Left and right heart catheterization performed 08/02/2011 is reviewed. This demonstrates normal coronary artery anatomy with no significant coronary artery disease. There is left dominant coronary circulation. Left ventricular systolic function appears normal. There is severe pulmonary hypertension with PA pressures measured 75/26 and mean pulmonary artery pressure 46. Pulmonary vascular resistance measured 4.3 Woods units. Pulmonary A wedge pressure was 20 but there were large V waves. Mean gradient across the mitral valve measured 14.4 mm mercury corresponding to calculated valve area 1.3 cm.  Impression:  The patient has at least moderate mitral stenosis and moderate mitral regurgitation with severe secondary pulmonary hypertension related to congestive heart failure. The patient has secondary severe tricuspid regurgitation. Over the last few years the patient has had progressive worsening of symptoms of shortness of breath to the point where she now get short of breath with minimal physical activity. She was also hospitalized in August  with episodes of transient supraventricular tachycardia. I agree that she would best be treated with mitral valve replacement and tricuspid valve repair. I think it would be reasonable to consider concomitant Maze procedure. Risks of surgery will be somewhat elevated because of the long-standing chronicity of this problem, the degree of pulmonary hypertension, and her limited functional status. The patient does not have a history of tobacco use and I doubt that she has significant underlying chronic obstructive pulmonary disease. However, her long-standing rheumatic mitral valve disease certainly might have resulted in the reversible pulmonary  hypertension. However, at this point I would be hopeful that she would likely tolerate surgery and hopefully enjoyed considerable symptomatic improvement postoperatively. She needs the dental service consult.   Plan:  I've discussed matters at length with the patient and her daughter here in the office today. We discussed the rationale for surgical intervention and all potential associated risks. We discussed the decision whether or not to replace her her mitral valve using a mechanical prosthesis or a bioprosthetic tissue valve. We will revisit this topic prior to surgery. We will send her for dental service consultation and possible tooth extraction. I've instructed her to stop taking Plavix at this time. We will also send her for CT angiogram of the chest abdomen and pelvis to ascertain whether or not she might be candidate for minimally invasive approach for her elective heart surgery. Finally, we will obtain followup pulmonary function tests. We will plan to see the patient back in 2 weeks to review the results of these tests and potentially schedule her surgery. All of her questions been addressed.

## 2011-08-26 NOTE — Patient Instructions (Signed)
Stop taking Plavix in anticipation of dental extraction and surgery.

## 2011-08-30 ENCOUNTER — Ambulatory Visit
Admission: RE | Admit: 2011-08-30 | Discharge: 2011-08-30 | Disposition: A | Discharge status: Home / Self Care | Payer: Medicare Other | Source: Ambulatory Visit | Attending: Thoracic Surgery (Cardiothoracic Vascular Surgery) | Admitting: Thoracic Surgery (Cardiothoracic Vascular Surgery)

## 2011-08-30 ENCOUNTER — Ambulatory Visit (HOSPITAL_COMMUNITY)
Admission: RE | Admit: 2011-08-30 | Discharge: 2011-08-30 | Disposition: A | Discharge status: Home / Self Care | Payer: Medicare Other | Source: Ambulatory Visit | Attending: Thoracic Surgery (Cardiothoracic Vascular Surgery) | Admitting: Thoracic Surgery (Cardiothoracic Vascular Surgery)

## 2011-08-30 DIAGNOSIS — I7409 Other arterial embolism and thrombosis of abdominal aorta: Secondary | ICD-10-CM

## 2011-08-30 DIAGNOSIS — R0989 Other specified symptoms and signs involving the circulatory and respiratory systems: Secondary | ICD-10-CM | POA: Insufficient documentation

## 2011-08-30 DIAGNOSIS — Z01811 Encounter for preprocedural respiratory examination: Secondary | ICD-10-CM | POA: Insufficient documentation

## 2011-08-30 DIAGNOSIS — I059 Rheumatic mitral valve disease, unspecified: Secondary | ICD-10-CM | POA: Insufficient documentation

## 2011-08-30 DIAGNOSIS — R0609 Other forms of dyspnea: Secondary | ICD-10-CM | POA: Insufficient documentation

## 2011-08-30 DIAGNOSIS — Z01818 Encounter for other preprocedural examination: Secondary | ICD-10-CM

## 2011-08-30 MED ORDER — IOHEXOL 350 MG/ML SOLN
100.0000 mL | Freq: Once | INTRAVENOUS | Status: AC | PRN
  Administered 2011-08-30: 100 mL via INTRAVENOUS

## 2011-09-03 ENCOUNTER — Other Ambulatory Visit (HOSPITAL_COMMUNITY): Payer: Self-pay | Admitting: Dentistry

## 2011-09-03 DIAGNOSIS — I059 Rheumatic mitral valve disease, unspecified: Secondary | ICD-10-CM

## 2011-09-03 DIAGNOSIS — Z954 Presence of other heart-valve replacement: Secondary | ICD-10-CM

## 2011-09-03 DIAGNOSIS — I050 Rheumatic mitral stenosis: Secondary | ICD-10-CM

## 2011-09-06 ENCOUNTER — Encounter (HOSPITAL_COMMUNITY)
Admission: RE | Admit: 2011-09-06 | Discharge: 2011-09-06 | Payer: Medicare Other | Source: Ambulatory Visit | Attending: Dentistry | Admitting: Dentistry

## 2011-09-06 ENCOUNTER — Other Ambulatory Visit (HOSPITAL_COMMUNITY): Payer: Self-pay | Admitting: Dentistry

## 2011-09-06 ENCOUNTER — Ambulatory Visit (HOSPITAL_COMMUNITY)
Admission: RE | Admit: 2011-09-06 | Discharge: 2011-09-06 | Disposition: A | Discharge status: Home / Self Care | Payer: Medicare Other | Source: Ambulatory Visit | Attending: Dentistry | Admitting: Dentistry

## 2011-09-06 DIAGNOSIS — I517 Cardiomegaly: Secondary | ICD-10-CM | POA: Insufficient documentation

## 2011-09-06 DIAGNOSIS — Z01812 Encounter for preprocedural laboratory examination: Secondary | ICD-10-CM | POA: Insufficient documentation

## 2011-09-06 DIAGNOSIS — Z01818 Encounter for other preprocedural examination: Secondary | ICD-10-CM | POA: Insufficient documentation

## 2011-09-06 LAB — COMPREHENSIVE METABOLIC PANEL
ALT: 13 U/L (ref 0–35)
AST: 16 U/L (ref 0–37)
Alkaline Phosphatase: 58 U/L (ref 39–117)
CO2: 28 mEq/L (ref 19–32)
Calcium: 10.5 mg/dL (ref 8.4–10.5)
Chloride: 99 mEq/L (ref 96–112)
GFR calc Af Amer: 66 mL/min — ABNORMAL LOW (ref >90)
GFR calc non Af Amer: 57 mL/min — ABNORMAL LOW (ref >90)
Glucose, Bld: 95 mg/dL (ref 70–99)
Potassium: 4.5 mEq/L (ref 3.5–5.1)
Sodium: 136 mEq/L (ref 135–145)

## 2011-09-06 LAB — CBC
Hemoglobin: 10.3 g/dL — ABNORMAL LOW (ref 12.0–15.0)
MCH: 27.7 pg (ref 26.0–34.0)
Platelets: 203 10*3/uL (ref 150–400)
RBC: 3.72 MIL/uL — ABNORMAL LOW (ref 3.87–5.11)
WBC: 8.6 10*3/uL (ref 4.0–10.5)

## 2011-09-09 ENCOUNTER — Encounter: Payer: Self-pay | Admitting: Thoracic Surgery (Cardiothoracic Vascular Surgery)

## 2011-09-09 ENCOUNTER — Other Ambulatory Visit: Payer: Self-pay | Admitting: Thoracic Surgery (Cardiothoracic Vascular Surgery)

## 2011-09-09 ENCOUNTER — Ambulatory Visit (INDEPENDENT_AMBULATORY_CARE_PROVIDER_SITE_OTHER): Payer: Medicare Other | Admitting: Thoracic Surgery (Cardiothoracic Vascular Surgery)

## 2011-09-09 VITALS — BP 123/60 | HR 74 | Resp 20 | Ht 62.0 in | Wt 165.0 lb

## 2011-09-09 DIAGNOSIS — I359 Nonrheumatic aortic valve disorder, unspecified: Secondary | ICD-10-CM

## 2011-09-09 DIAGNOSIS — I059 Rheumatic mitral valve disease, unspecified: Secondary | ICD-10-CM

## 2011-09-09 DIAGNOSIS — I052 Rheumatic mitral stenosis with insufficiency: Secondary | ICD-10-CM

## 2011-09-09 NOTE — Progress Notes (Signed)
Patient returns to the office today for further followup in consultation regarding mitral regurgitation due to to long-standing rheumatic mitral valve disease with chronic diastolic congestive heart failure, severe tricuspid regurgitation with pulmonary hypertension, and atrial fibrillation. She was originally seen in consultation 2 weeks ago and a full consultation report and history and physical exam were dictated at that time. Since then she underwent pulmonary function testing, CT angiogram, and she has been seen in consultation by Dr. Kristin Bruins. She is scheduled to undergo dental extraction tomorrow. She reports that after undergoing pulmonary function testing it seems as though her breathing is slightly worse. She still denies resting shortness of breath but her exertional shortness of breath remains problematic. She denies productive cough. She has not had fevers or chills. The remainder of her review of systems is unchanged from previously. Her physical exam is unchanged from previously.  Pulmonary function tests performed 08/30/2011 are reviewed. Baseline FEV1 measured 1.43 L or 64% predicted. This increased slightly with bronchodilator therapy. The FEF 25-75 was 1.25 L or 65% predicted. This did not improve with bronchodilator therapy. There was suggestion of hyperinflation with elevated residual volume. Diffusion capacity was moderately decreased.  CT angiogram of the chest abdomen and pelvis performed 08/30/2011 is reviewed. This reveals minimal atherosclerotic disease involving the thoracic and abdominal aorta and iliac vessels. The patient has changes consistent with previous partial gastrectomy. There is cardiomegaly. Chest CT scan is otherwise unchanged in comparison with previous CT scans and in particular there is no sign of any significant intrinsic lung disease. There is stable mediastinal lymphadenopathy that his chronic, unchanged, and undoubtedly reactive in nature.  I spent in excess of  30 minutes again discussing matters at length with Ms. [name] and her daughter. We specifically discussed whether or not to replace her mitral valve using a mechanical prosthesis or a bioprosthetic tissue valve. After considerable discussion the patient specifically requests that her valve be replaced using a bioprosthetic tissue valve in effort to avoid the potential need for long-term anticoagulation with Coumadin. We will therefore also plan to perform a Maze procedure the time of surgery to decrease the likelihood of future problems with atrial fibrillation. She also specifically requests that we perform surgery via the miniature thoracotomy approach. We discussed the relative risks and benefits of this approach and contrasted it with a conventional sternotomy. I emphasized the fact that her physical recovery from surgery will probably be more related to her chronic lung disease and limited mobility prior to surgery that it would be related to the surgical incision and approach that we choose to perform the surgery. All of her questions been addressed. She and her daughter understand the associated risks of surgery including but not limited to risk of death, stroke, myocardial infarction, congestive heart failure, respiratory failure, renal failure, bleeding requiring blood transfusion, arrhythmia, heart block or bradycardia requiring permanent pacemaker, late complications related to valve repair or replacement including the possibility of late bioprosthetic tissue valve degeneration in failure. We discussed a variety potential complications that may be associated with the mini thoracotomy approach. All of their questions been addressed.  We will plan to see the patient back in followup next week to make sure that she has recovered from her dental extraction without adverse complication. We tentatively plan to proceed with surgery on Tuesday, November 13. If she is doing well next week and desires to proceed  with her heart surgery, we will start her on amiodarone prophylactically at that time.

## 2011-09-10 ENCOUNTER — Ambulatory Visit (HOSPITAL_COMMUNITY)
Admission: RE | Admit: 2011-09-10 | Discharge: 2011-09-10 | Disposition: A | Discharge status: Home / Self Care | Payer: Medicare Other | Source: Ambulatory Visit | Attending: Dentistry | Admitting: Dentistry

## 2011-09-10 DIAGNOSIS — I10 Essential (primary) hypertension: Secondary | ICD-10-CM | POA: Insufficient documentation

## 2011-09-10 DIAGNOSIS — G4733 Obstructive sleep apnea (adult) (pediatric): Secondary | ICD-10-CM | POA: Insufficient documentation

## 2011-09-10 DIAGNOSIS — D510 Vitamin B12 deficiency anemia due to intrinsic factor deficiency: Secondary | ICD-10-CM | POA: Insufficient documentation

## 2011-09-10 DIAGNOSIS — J449 Chronic obstructive pulmonary disease, unspecified: Secondary | ICD-10-CM | POA: Insufficient documentation

## 2011-09-10 DIAGNOSIS — K029 Dental caries, unspecified: Secondary | ICD-10-CM

## 2011-09-10 DIAGNOSIS — I2510 Atherosclerotic heart disease of native coronary artery without angina pectoris: Secondary | ICD-10-CM | POA: Insufficient documentation

## 2011-09-10 DIAGNOSIS — K0530 Chronic periodontitis, unspecified: Secondary | ICD-10-CM

## 2011-09-10 DIAGNOSIS — I051 Rheumatic mitral insufficiency: Secondary | ICD-10-CM | POA: Insufficient documentation

## 2011-09-10 DIAGNOSIS — E119 Type 2 diabetes mellitus without complications: Secondary | ICD-10-CM | POA: Insufficient documentation

## 2011-09-10 DIAGNOSIS — K083 Retained dental root: Secondary | ICD-10-CM

## 2011-09-10 DIAGNOSIS — Z79899 Other long term (current) drug therapy: Secondary | ICD-10-CM | POA: Insufficient documentation

## 2011-09-10 DIAGNOSIS — K036 Deposits [accretions] on teeth: Secondary | ICD-10-CM

## 2011-09-10 DIAGNOSIS — E039 Hypothyroidism, unspecified: Secondary | ICD-10-CM | POA: Insufficient documentation

## 2011-09-10 DIAGNOSIS — Z8673 Personal history of transient ischemic attack (TIA), and cerebral infarction without residual deficits: Secondary | ICD-10-CM | POA: Insufficient documentation

## 2011-09-10 DIAGNOSIS — I079 Rheumatic tricuspid valve disease, unspecified: Secondary | ICD-10-CM | POA: Insufficient documentation

## 2011-09-10 DIAGNOSIS — Z853 Personal history of malignant neoplasm of breast: Secondary | ICD-10-CM | POA: Insufficient documentation

## 2011-09-10 DIAGNOSIS — J4489 Other specified chronic obstructive pulmonary disease: Secondary | ICD-10-CM | POA: Insufficient documentation

## 2011-09-10 LAB — GLUCOSE, CAPILLARY: Glucose-Capillary: 104 mg/dL — ABNORMAL HIGH (ref 70–99)

## 2011-09-11 NOTE — Op Note (Signed)
[name], AGOSTINELLI NO.:  [id number]  MEDICAL RECORD NO.:  [id number]  LOCATION:  [facility]                         FACILITY:  [facility]  PHYSICIAN:  Cindra Eves, D.D.S.DATE OF BIRTH:  Feb 05, 1941  DATE OF PROCEDURE:  09/10/2011 DATE OF DISCHARGE:  09/10/2011                              OPERATIVE REPORT   PREOPERATIVE DIAGNOSES: 1. Rheumatic heart valve disease. 2. Pre-heart valve surgery dental protocol. 3. Chronic periodontitis. 4. Accretions. 5. Multiple retained root segments. 6. Dental caries and suboptimal dental restorations.  POSTOPERATIVE DIAGNOSES: 1. Rheumatic heart valve disease. 2. Pre-heart valve surgery dental protocol. 3. Chronic periodontitis. 4. Accretions. 5. Multiple retained root segments. 6. Dental caries and suboptimal dental restorations.  OPERATIONS: 1. Extraction of tooth #6 and 21 with alveoloplasty. 2. Gross debridement of remaining dentition. 3. Dental restorations of tooth #25 and 30 per operative report.  SURGEON:  Cindra Eves, D.D.S.  ASSISTANT:  Doris Silverthrone (Sales executive).  ANESTHESIA:  General anesthesia via nasoendotracheal tube.  MEDICATIONS: 1. Ancef 1 g IV prior to invasive dental procedures. 2. Local anesthesia with a total utilization of 2 carpules each     containing 34 mg of lidocaine with 0.017 mg of epinephrine as well     as 2 carpules each containing 9 mg of bupivacaine with 0.009 mg of     epinephrine.  SPECIMENS:  There were two teeth that were discarded.  COMPLICATIONS:  None.  ESTIMATED BLOOD LOSS:  Less than 50 mL.  FLUIDS:  Per the Anesthesia record.  INDICATIONS:  The patient was recently diagnosed with rheumatic heart valve disease.  The patient with anticipated heart valve surgery.  The patient was seen as part of pre-heart valve surgery dental protocol evaluation.  The patient was examined and treatment planned for multiple extractions with alveoloplasty, gross  debridement of remaining dentition and selective dental restorations as indicated.  This treatment plan formulated to decrease the risk and complications associated with dental infection from affecting the patient's systemic health and anticipated heart valve surgeries.  OPERATIVE FINDINGS:  The patient was examined in the operating room #2. The teeth were identified for extraction.  Tooth #6 and 21 were identified for extraction at this time.  Others were considered, but the patient refused to have these teeth extracted at this time, including tooth #7 and 19.  The patient was noted to be affected by multiple retained roots, dental caries, suboptimal dental restorations, chronic periodontitis, and accretions.  DESCRIPTION OF PROCEDURE:  The patient was brought to the main operating room #2.  The patient was then placed in supine position on the operating room table.  General anesthesia was then induced via a nasal endotracheal tube.  The patient was then prepped and draped in the usual manner for dental medicine procedure.  A time-out was performed and the patient was identified, procedures were verified.  A throat pack was placed at this time.  The oral cavity was then thoroughly examined with findings noted above.  The patient was then ready for the dental medicine procedure as follows:  Local anesthesia was administered sequentially with a total utilization of 2 carpules each containing 34 mg of lidocaine with 0.017 mg of  epinephrine as well as 2 carpules each containing 9 mg of bupivacaine with 0.009 mg of epinephrine.  The maxillary right quadrant was first approached.  Anesthesia was delivered via infiltration utilizing lidocaine with epinephrine.  At this point in time, 15 blade incision was made from the distal of #5 and extended to the mesial of #6. A surgical flap was then carefully reflected.  Appropriate amounts of buccal and palatal bone were removed at this time with  a surgical handpiece and bur and copious amounts of sterile saline.  The tooth was subluxated and tooth #6 was then removed with a 150 forceps without complications.  Minor alveoloplasty was then performed utilizing rongeurs and bone file.  The surgical site was irrigated with copious amounts of sterile saline.  A piece of Surgifoam was then placed in the extraction socket.  The surgical site was then closed from distal #5 and extended to the mesial #6 utilizing 3-0 chromic gut suture in a continuous interrupted suture technique x1.  At this point in time, the sonic scaler was used to perform a gross debridement of the remaining maxillary dentition.  Series of hand curettes were utilized to refine the removal of accretions as indicated.  At this point in time, the mandibular quadrants were approached.  The patient was given bilateral inferior alveolar nerve blocks and long buccal nerve blocks utilizing bupivacaine with epinephrine.  Further infiltration was achieved around tooth #21 utilizing the lidocaine with epinephrine.  At this point in time, 15 blade incision was made from the mesial #19 extended to the mesial #21.  Surgical flap was then carefully reflected.  Appropriate amounts of buccal and interseptal bone were removed around tooth #21.  The tooth was subluxated with a series straight elevators.  The coronal aspect was then removed with a 151 forceps leaving the root remaining.  Further bone was then removed as indicated and the tooth was then subluxated and eventually removed with the 151 forceps without further complications.  Alveoplasty was then performed utilizing rongeurs and bone file.  Surgical site was irrigated with copious amounts of sterile saline.  The tissues were approximated and trimmed appropriately.  A piece of Surgifoam was then placed in the extraction socket appropriately.  Surgical site was then closed from the mesial of #19 extended to the mesial #21 with  3-0 chromic gut material in a continuous interrupted suture technique x1.  At this point in time, tooth #25 and 30 were approached.  Appropriate preparations were placed with a handpiece and bur.  Tooth #30 was then approached and a final restoration was placed utilizing the Fuji glass  ionomer material in a MOB restoration.  This was finished appropriately.  Occlusion was acceptable.  Tooth #25 was then approached.  A DIFL resin restoration was then placed utilizing shade A2 Tetric Ceram EVO resin material.  The restoration was carved and  polished appropriately.The occlusion was checked and appeared to be  acceptable at this time. At this point in time, the remaining portion of the gross debridement procedure was performed utilizing Macabre sonic scaler as well as a series of hand curettes.  The entire mouth was then irrigated with copious amounts of sterile saline.  The patient was examined for complications, seeing none, the dental medicine procedure was deemed to be complete.  The patient was then handed over to the anesthesia team for final disposition.  After appropriate amount of time, the patient was extubated and taken to the post anesthesia care unit with stable vital  signs and good oxygenation level.  All counts were correct for the dental medicine procedure.  The patient will be seen approximately 7-10 days for evaluation for suture removal.  The heart valve surgery will then be performed by Dr. Cornelius Moras as per his protocols.  At the request of Dr. Cornelius Moras, the Plavix therapy continued to be held at this time.  The patient was given appropriate pain medication and was to utilize the hydrocodone/acetaminophen 5/325, taking 1-2 tablets every 6 hours as needed for dental pain.          ______________________________ Cindra Eves, D.D.S.     RK/MEDQ  D:  09/10/2011  T:  09/10/2011  Job:  [id number]  cc:   Bevelyn Buckles. Bensimhon, MD [address], [place]  [address]  Salvatore Decent. Cornelius Moras, M.D. [address] [place] [address]  Electronically Signed by Cindra Eves D.D.S. on 09/11/2011 14:78:29 AM

## 2011-09-16 ENCOUNTER — Ambulatory Visit (INDEPENDENT_AMBULATORY_CARE_PROVIDER_SITE_OTHER): Payer: Medicare Other | Admitting: Thoracic Surgery (Cardiothoracic Vascular Surgery)

## 2011-09-16 ENCOUNTER — Ambulatory Visit: Payer: Medicare Other | Admitting: Thoracic Surgery (Cardiothoracic Vascular Surgery)

## 2011-09-16 ENCOUNTER — Ambulatory Visit (HOSPITAL_COMMUNITY): Payer: Self-pay | Admitting: Dentistry

## 2011-09-16 ENCOUNTER — Encounter: Payer: Self-pay | Admitting: Thoracic Surgery (Cardiothoracic Vascular Surgery)

## 2011-09-16 VITALS — BP 122/62 | HR 70 | Resp 20 | Ht 62.0 in | Wt 163.0 lb

## 2011-09-16 VITALS — BP 116/58 | HR 71 | Temp 97.6°F

## 2011-09-16 DIAGNOSIS — K0530 Chronic periodontitis, unspecified: Secondary | ICD-10-CM

## 2011-09-16 DIAGNOSIS — I052 Rheumatic mitral stenosis with insufficiency: Secondary | ICD-10-CM

## 2011-09-16 DIAGNOSIS — K08109 Complete loss of teeth, unspecified cause, unspecified class: Secondary | ICD-10-CM

## 2011-09-16 DIAGNOSIS — K08409 Partial loss of teeth, unspecified cause, unspecified class: Secondary | ICD-10-CM

## 2011-09-16 MED ORDER — CHLORHEXIDINE GLUCONATE 0.12 % MT SOLN: OROMUCOSAL | Status: DC

## 2011-09-16 MED ORDER — AMIODARONE HCL 200 MG PO TABS: 200.0000 mg | ORAL_TABLET | Freq: Two times a day (BID) | ORAL | Status: DC

## 2011-09-16 NOTE — Progress Notes (Signed)
  BP 116/58  Pulse 71  Temp(Src) 97.6 F (36.4 C) (Oral)  Milley Vining is a 70 year old female referred as part of a pre-heart valve surgery dental protocol. Patient had multiple dental extractions, periodontal therapy, and dental restorations in the operating room on 09/10/2011. Patient now presents for periodic oral examination and evaluation of healing.  Subjective: Patient with minor complaints of discomfort. Patient indicates that all stitches are gone.  Objective: There is no sign of infection, heme, or ooze. Sutures are all gone. Patient would generalized primary closure with some areas that we'll need to healing by secondary intention. Dental restorations appear to be acceptable. Patient is aware of the need for possible crown restorations in the future however.  Assessment: Postop course is consistent with dental procedures in the operating room.  Plan/recommendations: 1. Patient's continue saltwater rinses as needed every 2 hours to aid healing. 2. Patient will be given a prescription for chlorhexidine rinses. Patient is rinse twice daily as directed. Patient is to continue chlorhexidine rinses for approximately 3 months. 3. Patient is currently cleared for heart valve surgery with Dr. Cornelius Moras. 4. Patient's contact dental medicine as needed. Dr. Cindra Eves.

## 2011-09-16 NOTE — H&P (Signed)
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 In 2010 she was evaluated and underwent cardiac catheterization revealing normal coronary artery anatomy. She was found to have pulmonary hypertension. She was sent for pulmonary medicine consultation and she has been followed since then by Dr. Byrum. She is on home oxygen therapy. She reports that over the last several years she has had continued progression of symptoms of exertional shortness of breath. She has had occasional tachypalpitations. In December 2011 she was hospitalized in [place], [place] with a mini stroke or transient neurologic deficit. She was started on Plavix at that time. In August of 2012 she was again hospitalized in [place] with shortness of breath and tachypalpitations. She was told that she was having episodes of irregular heart rhythms and she was offered possible invasive ablation therapy. The patient declined and stated that she wanted to followup with her primary cardiologist. She was seen in followup and transthoracic echocardiogram was performed 07/18/2011. This revealed mitral stenosis and mitral regurgitation with normal left ventricular systolic function. There was severe tricuspid regurgitation. The patient subsequently underwent transesophageal echocardiogram on 08/02/2011. 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 There was normal left ventricular size and function. There was severe tricuspid regurgitation. Left and right heart catheterization was also performed 08/02/2011. This confirmed the presence of moderate to severe mitral stenosis. There was pulmonary hypertension. There was normal coronary  artery anatomy with no significant coronary artery disease. The patient has been referred to consider mitral valve replacement and tricuspid valve repair with or without a Maze procedure.  Past Medical History   Diagnosis  Date   .  Pulmonary hypertension         s/p RHC 4/10 - PAP 86/29, wedge 21-23   .  Squamous cell carcinoma         Left arm   .  Gastric ulcer with hemorrhage     .  Rheumatic fever         Childhood   .  Breast cancer         2008 s/p chemo and radiation (last tx 4/09) s/p left mastectomy s/p 9 lymph node resection - [name], [place]   .  Coronary artery disease         Nonobstructive, LVEF 55%   .  Mitral regurgitation         Moderate   .  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  Stroke         12/11 [place]   .  Mitral stenosis with insufficiency, rheumatic         Past Surgical History   Procedure  Date   .  Left mastectomy     .  Partial gastrectomy         Family History   Problem  Relation  Age of Onset   .  Heart disease  Mother     .  Colon cancer  Mother       Social History History   Substance Use Topics   .  Smoking status:  Never Smoker    .  Smokeless tobacco:  Never Used   .  Alcohol Use:  No         Current Outpatient Prescriptions   Medication  Sig  Dispense  Refill   .  amitriptyline (ELAVIL) 75 MG tablet  2 tabs every evening           .  clopidogrel (PLAVIX) 75 MG tablet  Take 75 mg by mouth daily.           .  cyanocobalamin (,VITAMIN B-12,) 1000 MCG/ML injection  Inject 1,000 mcg into the muscle every 30 (thirty) days.           .  diphenhydrAMINE (BENADRYL) 25 MG tablet  Take 25 mg by mouth at bedtime.           .  folic acid (FOLVITE) 1 MG tablet  Take 2 tabs every morning         .  furosemide (LASIX) 40 MG tablet  Take 40 mg by mouth daily.           .  hydrocodone-acetaminophen (LORCET PLUS) 7.5-650 MG per tablet  May take up to three times daily as needed         .  letrozole (FEMARA) 2.5  MG tablet  Take 2.5 mg by mouth daily.           .  levothyroxine (SYNTHROID, LEVOTHROID) 137 MCG tablet  Take 137 mcg by mouth daily.           .  metFORMIN (GLUCOPHAGE) 500 MG tablet  Take one tab every morning & 2 tabs with supper         .  metoprolol (LOPRESSOR) 50 MG tablet  Take 1 tablet by mouth Twice daily.         .  Multiple Vitamins-Minerals (CENTRUM SILVER PO)  Take 1 tablet by mouth daily.           .  nitroGLYCERIN (NITROSTAT) 0.4 MG SL tablet  Place 0.4 mg under the tongue every 5 (five) minutes as needed.           .  pantoprazole (PROTONIX) 40 MG tablet  Take 40 mg by mouth daily.           .  pirbuterol (MAXAIR) 200 MCG/INH inhaler  Inhale 2 puffs into the lungs 4 (four) times daily.           .  potassium chloride (KLOR-CON) 10 MEQ CR tablet  Take 10 mEq by mouth. 2 tabs am 1 tab pm         .  rosuvastatin (CRESTOR) 10 MG tablet  Take 10 mg by mouth daily.           .  valsartan (DIOVAN) 160 MG tablet  Take 160 mg by mouth daily.           .  zolpidem (AMBIEN) 10 MG tablet  Take 10 mg by mouth at bedtime as needed.               Allergies   Allergen  Reactions   .  Butorphanol Tartrate         REACTION: migraines, nervous/agitated   .  Lipitor (Atorvastatin Calcium)  Diarrhea       nightmares   .  Revatio (Sildenafil Citrate)  Swelling     Review of Systems  Constitutional: Positive for activity change, fatigue and unexpected weight change.       The patient complains of worsening fatigue. She has had decreased activity. Associated with this she has gained 10-15 pounds over the last 2 months.  HENT: Positive   for congestion and postnasal drip.         She has not seen a dentist in several years and she has a broken tooth that she feels probably needs to be extracted.  Eyes: Negative.   Respiratory: Positive for shortness of breath and wheezing.   Cardiovascular: Positive for palpitations and leg swelling. Negative for chest pain.        The patient describes a 2-3  year history of progressive symptoms of exertional shortness of breath. This has gotten particularly bad over the last several months such that now she gets short of breath with minimal physical activity. She denies orthopnea. She denies resting shortness of breath. She has not had exertional chest pain. She has had some occasional tachypalpitations without syncope. She has had mild lower extremity edema without significant abdominal bloating.  Gastrointestinal: Negative.   Genitourinary: Negative.   Musculoskeletal: Positive for back pain and arthralgias.       Diffuse chronic arthralgias and myalgias particularly afflicting her neck shoulders back in her right knee. However, she ambulates without assistance and her primary limitation is shortness of breath.  Neurological: Negative.   Hematological: Negative.   Psychiatric/Behavioral: Negative.     BP 121/63  Pulse 86  Resp 20  Ht 5' 2" (1.575 m)  Wt 165 lb (74.844 kg)  BMI 30.18 kg/m2  SpO2 98% Physical Exam  Vitals reviewed. Constitutional: She is oriented to person, place, and time. She appears well-developed.  HENT:   Head: Normocephalic.  Eyes: Pupils are equal, round, and reactive to light.  Neck: Normal range of motion. No JVD present.  Cardiovascular: Normal rate and regular rhythm.    Murmur heard.      There is a soft systolic murmur heard best along the left sternal border  Abdominal: Soft. Bowel sounds are normal. She exhibits no distension and no mass.  Musculoskeletal: Normal range of motion. She exhibits no edema.  Lymphadenopathy:    She has no cervical adenopathy.  Neurological: She is alert and oriented to person, place, and time.  Skin: Skin is warm and dry.  Psychiatric: She has a normal mood and affect. Her behavior is normal. Judgment and thought content normal.    Diagnostic Tests:  Transesophageal echocardiogram performed 08/02/2011 is reviewed. This reveals findings consistent with rheumatic mitral  valve disease. There is at least moderate mitral stenosis with valve area estimated 1.8 centimeters squared. There is at least moderate mitral regurgitation. The leaflets of the mitral valve are severely restricted during both systole and diastole. This corresponds to type IIIA mitral valve dysfunction as would be expected for rheumatic disease. The degree of mitral regurgitation is bad enough that balloon valvuloplasty probably would be unwise. Left ventricular size and function appears normal. There is severe left atrial enlargement. There is trace aortic insufficiency. There is severe tricuspid regurgitation. There is moderate right ventricular chamber enlargement. The dysfunction of the tricuspid valve appears to be purely functional, type I dysfunction, although the leaflets of the valve are not well visualized.  Left and right heart catheterization performed 08/02/2011 is reviewed. This demonstrates normal coronary artery anatomy with no significant coronary artery disease. There is left dominant coronary circulation. Left ventricular systolic function appears normal. There is severe pulmonary hypertension with PA pressures measured 75/26 and mean pulmonary artery pressure 46. Pulmonary vascular resistance measured 4.3 Woods units. Pulmonary A wedge pressure was 20 but there were large V waves. Mean gradient across the mitral valve measured 14.4 mm mercury corresponding to calculated   valve area 1.3 cm. 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 In 2010 she was evaluated and underwent cardiac catheterization revealing normal coronary artery anatomy. She was found to have pulmonary hypertension. She was sent for pulmonary medicine consultation and she has been followed since then by Dr.  Byrum. She is on home oxygen therapy. She reports that over the last several years she has had continued progression of symptoms of exertional shortness of breath. She has had occasional tachypalpitations. In December 2011 she was hospitalized in [place], [place] with a mini stroke or transient neurologic deficit. She was started on Plavix at that time. In August of 2012 she was again hospitalized in [place] with shortness of breath and tachypalpitations. She was told that she was having episodes of irregular heart rhythms and she was offered possible invasive ablation therapy. The patient declined and stated that she wanted to followup with her primary cardiologist. She was seen in followup and transthoracic echocardiogram was performed 07/18/2011. This revealed mitral stenosis and mitral regurgitation with normal left ventricular systolic function. There was severe tricuspid regurgitation. The patient subsequently underwent transesophageal echocardiogram on 08/02/2011. 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 There was normal left ventricular size and function. There was severe tricuspid regurgitation. Left and right heart catheterization was also performed 08/02/2011. This confirmed the presence of moderate to severe mitral stenosis. There was pulmonary hypertension. There was normal coronary artery anatomy with no significant coronary artery disease. The patient has been referred to consider mitral valve replacement and tricuspid valve repair with or without a Maze procedure.  Pulmonary function tests performed 08/30/2011 are reviewed. Baseline FEV1 measured 1.43 L or 64% predicted. This increased slightly with bronchodilator therapy. The FEF 25-75 was 1.25 L or 65% predicted. This did not improve with bronchodilator therapy. There was suggestion of hyperinflation with elevated residual volume.  Diffusion capacity was moderately decreased.  CT angiogram of the chest abdomen and pelvis performed 08/30/2011 is reviewed. This reveals minimal atherosclerotic disease involving the thoracic and abdominal aorta and iliac vessels. The patient has changes consistent with previous partial gastrectomy. There is cardiomegaly. Chest CT scan is otherwise unchanged in comparison with previous CT scans and in particular there is no sign of any significant intrinsic lung disease. There is stable mediastinal lymphadenopathy that his chronic, unchanged, and undoubtedly reactive in nature.   Impression:  The patient has at least moderate mitral stenosis and moderate mitral regurgitation with severe secondary pulmonary hypertension related to congestive heart failure. The patient has secondary severe tricuspid regurgitation. Over the last few years the patient has had progressive worsening of symptoms of shortness of breath to the point where she now get short of breath with minimal physical activity. She was also hospitalized in August with episodes of transient supraventricular tachycardia. I agree that she would best be treated with mitral valve replacement and tricuspid valve repair. I think it would be reasonable to consider concomitant Maze procedure. Risks of surgery will be somewhat elevated because of the long-standing chronicity of this problem, the degree of pulmonary hypertension, and her limited functional status. The patient does not have a history of tobacco use and I doubt that she has significant underlying chronic obstructive pulmonary disease. However, her long-standing rheumatic mitral valve disease   certainly might have resulted in the reversible pulmonary hypertension. However, at this point I would be hopeful that she would likely tolerate surgery and hopefully enjoyed considerable symptomatic improvement postoperatively  Plan:  We plan to proceed with mitral valve replacement using a  bioprosthetic tissue valve. We will perform Maze procedure and possible tricuspid valve repair at the time of surgery. This patient specifically requests that we approach her case using the right mini thoracotomy approach. I have counseled the patient at length regarding the indications, risks, and potential benefits of surgery. The patient and her daughter both understand the somewhat high-risk of surgery given her limited functional status, long-standing pulmonary hypertension, and other chronic medical illnesses. They understand the potential for a prolonged convalescence even in the absence of any postoperative complications. They understand and accept all potential associated risks of surgery including but not limited to risk of death, stroke, myocardial infarction, congestive heart failure, respiratory failure, renal failure, pneumonia, bleeding requiring blood transfusion, heart block or bradycardia or requiring permanent pacemaker, arrhythmia, infection, late complications related to valve repair or replacement and in particular the potential for late structural valve deterioration in failure do to replacement of her mitral valve using a bioprosthetic tissue valve. All of her questions been addressed. We tentatively plan to proceed with surgery on Tuesday, November 13. 

## 2011-09-16 NOTE — Patient Instructions (Signed)
Begin Amiodarone today.  Preop in Short Stay this Friday for OR next Tuesday.

## 2011-09-16 NOTE — Progress Notes (Signed)
Patient returns to the office today for followup of mitral stenosis with mitral regurgitation, tricuspid regurgitation, and atrial fibrillation. She underwent that dental extraction last week and has recovered uneventfully. She is ready to proceed with surgery as previously planned for Tuesday, November 13. We spent in excess of 30 minutes reviewing the indications, risks, and potential benefits of surgery. The patient specifically requests that we perform the operation using the right mini thoracotomy approach. We will plan to replace her mitral valve, and she specifically requests that we replace it using a bioprosthetic tissue valve. She understands that this comes with significant potential for late structural valve deterioration in failure depending upon her longevity. We will plan to per perform a Maze procedure and possible tricuspid valve repair if indicated. She understands and accepts all potential associated risks of surgery including but not limited to risk of death, stroke, myocardial infarction, congestive heart failure, respiratory failure, renal failure, bleeding requiring blood transfusion, arrhythmia, heart block or bradycardia requiring permanent pacemaker, late complications related to valve replacement or repair, late recurrence of atrial fibrillation, or a variety of complications related to the mini thoracotomy approach. She understands that her postoperative convalescence may be quite slow because of her baseline functional status and limited shortness of breath. Similarly, the overall risks of surgery are fairly high because of her baseline comorbid medical problems with history of chronic congestive heart failure and pulmonary hypertension. All of her questions been addressed. She will begin amiodarone 200 mg twice daily to take between now and the time of surgery to decrease the risk of irregular heart rhythms following surgery.

## 2011-09-19 ENCOUNTER — Other Ambulatory Visit: Payer: Self-pay

## 2011-09-19 ENCOUNTER — Encounter (HOSPITAL_COMMUNITY): Payer: Self-pay | Admitting: Pharmacy Technician

## 2011-09-19 DIAGNOSIS — I4891 Unspecified atrial fibrillation: Secondary | ICD-10-CM

## 2011-09-19 DIAGNOSIS — I079 Rheumatic tricuspid valve disease, unspecified: Secondary | ICD-10-CM

## 2011-09-19 DIAGNOSIS — I059 Rheumatic mitral valve disease, unspecified: Secondary | ICD-10-CM

## 2011-09-20 ENCOUNTER — Other Ambulatory Visit: Payer: Self-pay

## 2011-09-20 ENCOUNTER — Encounter (HOSPITAL_COMMUNITY): Payer: Self-pay

## 2011-09-20 ENCOUNTER — Encounter (HOSPITAL_COMMUNITY)
Admission: RE | Admit: 2011-09-20 | Discharge: 2011-09-20 | Disposition: A | Discharge status: Home / Self Care | Payer: Medicare Other | Source: Ambulatory Visit | Attending: Thoracic Surgery (Cardiothoracic Vascular Surgery) | Admitting: Thoracic Surgery (Cardiothoracic Vascular Surgery)

## 2011-09-20 ENCOUNTER — Ambulatory Visit (HOSPITAL_COMMUNITY)
Admission: RE | Admit: 2011-09-20 | Discharge: 2011-09-20 | Disposition: A | Discharge status: Home / Self Care | Payer: Medicare Other | Source: Ambulatory Visit | Attending: Thoracic Surgery (Cardiothoracic Vascular Surgery) | Admitting: Thoracic Surgery (Cardiothoracic Vascular Surgery)

## 2011-09-20 DIAGNOSIS — I4891 Unspecified atrial fibrillation: Secondary | ICD-10-CM

## 2011-09-20 DIAGNOSIS — Z01810 Encounter for preprocedural cardiovascular examination: Secondary | ICD-10-CM | POA: Insufficient documentation

## 2011-09-20 DIAGNOSIS — I2510 Atherosclerotic heart disease of native coronary artery without angina pectoris: Secondary | ICD-10-CM | POA: Insufficient documentation

## 2011-09-20 DIAGNOSIS — Z01812 Encounter for preprocedural laboratory examination: Secondary | ICD-10-CM | POA: Insufficient documentation

## 2011-09-20 DIAGNOSIS — I059 Rheumatic mitral valve disease, unspecified: Secondary | ICD-10-CM

## 2011-09-20 DIAGNOSIS — I079 Rheumatic tricuspid valve disease, unspecified: Secondary | ICD-10-CM

## 2011-09-20 HISTORY — DX: Pneumonia, unspecified organism: J18.9

## 2011-09-20 HISTORY — DX: Fibromyalgia: M79.7

## 2011-09-20 HISTORY — DX: Vitamin B12 deficiency anemia due to intrinsic factor deficiency: D51.0

## 2011-09-20 HISTORY — DX: Inflammatory liver disease, unspecified: K75.9

## 2011-09-20 HISTORY — DX: Unspecified osteoarthritis, unspecified site: M19.90

## 2011-09-20 HISTORY — DX: Hypothyroidism, unspecified: E03.9

## 2011-09-20 HISTORY — DX: Gastro-esophageal reflux disease without esophagitis: K21.9

## 2011-09-20 LAB — URINALYSIS, ROUTINE W REFLEX MICROSCOPIC
Glucose, UA: NEGATIVE mg/dL
Hgb urine dipstick: NEGATIVE
Ketones, ur: NEGATIVE mg/dL
Protein, ur: NEGATIVE mg/dL

## 2011-09-20 LAB — COMPREHENSIVE METABOLIC PANEL
ALT: 13 U/L (ref 0–35)
Alkaline Phosphatase: 61 U/L (ref 39–117)
BUN: 25 mg/dL — ABNORMAL HIGH (ref 6–23)
CO2: 22 mEq/L (ref 19–32)
Calcium: 9.9 mg/dL (ref 8.4–10.5)
GFR calc Af Amer: 76 mL/min — ABNORMAL LOW (ref >90)
GFR calc non Af Amer: 66 mL/min — ABNORMAL LOW (ref >90)
Glucose, Bld: 104 mg/dL — ABNORMAL HIGH (ref 70–99)
Sodium: 139 mEq/L (ref 135–145)

## 2011-09-20 LAB — CBC
HCT: 33.3 % — ABNORMAL LOW (ref 36.0–46.0)
Hemoglobin: 10.4 g/dL — ABNORMAL LOW (ref 12.0–15.0)
MCH: 27.3 pg (ref 26.0–34.0)
MCV: 87.4 fL (ref 78.0–100.0)
RBC: 3.81 MIL/uL — ABNORMAL LOW (ref 3.87–5.11)

## 2011-09-20 LAB — BLOOD GAS, ARTERIAL
Acid-base deficit: 1 mmol/L (ref 0.0–2.0)
Bicarbonate: 23.9 mEq/L (ref 20.0–24.0)
Drawn by: 344381
O2 Saturation: 98.8 %
TCO2: 25.3 mmol/L (ref 0–100)
pCO2 arterial: 44.7 mmHg (ref 35.0–45.0)
pO2, Arterial: 123 mmHg — ABNORMAL HIGH (ref 80.0–100.0)

## 2011-09-20 LAB — HEMOGLOBIN A1C: Hgb A1c MFr Bld: 5.9 % — ABNORMAL HIGH (ref <5.7)

## 2011-09-20 LAB — PROTIME-INR: Prothrombin Time: 14.2 seconds (ref 11.6–15.2)

## 2011-09-20 LAB — ABO/RH: ABO/RH(D): A POS

## 2011-09-20 MED ORDER — CHLORHEXIDINE GLUCONATE 4 % EX LIQD: 30.0000 mL | CUTANEOUS | Status: DC

## 2011-09-20 MED ORDER — METOPROLOL TARTRATE 12.5 MG HALF TABLET: 12.5000 mg | ORAL_TABLET | Freq: Once | ORAL | Status: DC

## 2011-09-20 NOTE — Progress Notes (Signed)
*  PRELIMINARY RESULTS*  Pre-CABG Dopplers has been performed. Preliminary- No significant ICA stenosis bilaterally. Vertebral arteries are patent with antegrade flow. Palmer Arch Evaluation - Left Doppler waveforms decreased by 50% with radial compression and remain normal with ulnar compression. Right waveforms obliterate with radial compression and remain normal with ulnar compression.  [name] 09/20/2011, 12:00 PM

## 2011-09-20 NOTE — Pre-Procedure Instructions (Signed)
[name]  09/20/2011   Your procedure is scheduled on:  Tuesday  09/24/11  Report to [facility] at530 AM.  Call this number if you have problems the morning of surgery: [phone]   Remember:   Do not eat food:After Midnight.  Do not drink clear liquids: 4 Hours before arrival.  Take these medicines the morning of surgery with A SIP OF WATER: AMIODARONE, HYDROCODONE,  FEMARA, SYNTHROID, METOPROLOL, PROTONIX, MAXAIR, POTASSIUM,    Do not wear jewelry, make-up or nail polish.  Do not wear lotions, powders, or perfumes. You may wear deodorant.  Do not shave 48 hours prior to surgery.  Do not bring valuables to the hospital.  Contacts, dentures or bridgework may not be worn into surgery.  Leave suitcase in the car. After surgery it may be brought to your room.  For patients admitted to the hospital, checkout time is 11:00 AM the day of discharge.   Patients discharged the day of surgery will not be allowed to drive home.  Name and phone number of your driver:  SUSAN COLE (DAUGHTER)  Special Instructions: CHG Shower Use Special Wash: 1/2 bottle night before surgery and 1/2 bottle morning of surgery.   Please read over the following fact sheets that you were given: Pain Booklet, Open Heart Packet, MRSA Information and Surgical Site Infection Prevention

## 2011-09-23 MED ORDER — SODIUM CHLORIDE 0.9 % IV SOLN
INTRAVENOUS | Status: DC
  Filled 2011-09-23: qty 40

## 2011-09-23 MED ORDER — EPINEPHRINE HCL 1 MG/ML IJ SOLN
0.5000 ug/min | INTRAVENOUS | Status: DC
  Filled 2011-09-23: qty 4

## 2011-09-23 MED ORDER — NITROGLYCERIN IN D5W 200-5 MCG/ML-% IV SOLN
2.0000 ug/min | INTRAVENOUS | Status: DC
  Filled 2011-09-23 (×2): qty 250

## 2011-09-23 MED ORDER — DOPAMINE-DEXTROSE 3.2-5 MG/ML-% IV SOLN
2.0000 ug/kg/min | INTRAVENOUS | Status: DC
  Filled 2011-09-23: qty 250

## 2011-09-23 MED ORDER — PHENYLEPHRINE HCL 10 MG/ML IJ SOLN
30.0000 ug/min | INTRAVENOUS | Status: DC
  Filled 2011-09-23: qty 2

## 2011-09-23 MED ORDER — POTASSIUM CHLORIDE 2 MEQ/ML IV SOLN
80.0000 meq | INTRAVENOUS | Status: DC
  Filled 2011-09-23: qty 40

## 2011-09-23 MED ORDER — CHLORHEXIDINE GLUCONATE 4 % EX LIQD: 30.0000 mL | CUTANEOUS | Status: DC

## 2011-09-23 MED ORDER — PLASMA-LYTE 148 IV SOLN
INTRAVENOUS | Status: AC
  Administered 2011-09-24: 17:00:00
  Filled 2011-09-23: qty 0.5

## 2011-09-23 MED ORDER — DEXMEDETOMIDINE HCL 100 MCG/ML IV SOLN
0.1000 ug/kg/h | INTRAVENOUS | Status: DC
  Filled 2011-09-23: qty 4

## 2011-09-23 MED ORDER — VANCOMYCIN HCL 1000 MG IV SOLR
1250.0000 mg | INTRAVENOUS | Status: DC
  Filled 2011-09-23 (×2): qty 1250

## 2011-09-23 MED ORDER — DEXTROSE 5 % IV SOLN
1.5000 g | INTRAVENOUS | Status: DC
  Filled 2011-09-23 (×2): qty 1.5

## 2011-09-23 MED ORDER — MAGNESIUM SULFATE 50 % IJ SOLN
40.0000 meq | INTRAMUSCULAR | Status: DC
  Filled 2011-09-23: qty 10

## 2011-09-23 MED ORDER — METOPROLOL TARTRATE 12.5 MG HALF TABLET: 12.5000 mg | ORAL_TABLET | Freq: Once | ORAL | Status: DC

## 2011-09-23 MED ORDER — SODIUM CHLORIDE 0.9 % IV SOLN
INTRAVENOUS | Status: DC
  Filled 2011-09-23: qty 1

## 2011-09-23 MED ORDER — DEXTROSE 5 % IV SOLN
750.0000 mg | INTRAVENOUS | Status: DC
  Filled 2011-09-23 (×2): qty 750

## 2011-09-23 NOTE — H&P (Signed)
Purcell Nails, MD  08/26/2011  5:52 PM  Signed  PCP is Alton Revere, MD, MD Referring Provider is Bensimhon, Bevelyn Buckles, MD    Chief Complaint   Patient presents with   .  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 In 2010 she was evaluated and underwent cardiac catheterization revealing normal coronary artery anatomy. She was found to have pulmonary hypertension. She was sent for pulmonary medicine consultation and she has been followed since then by Dr. Delton Coombes. She is on home oxygen therapy. She reports that over the last several years she has had continued progression of symptoms of exertional shortness of breath. She has had occasional tachypalpitations. In December 2011 she was hospitalized in [place], [place] with a mini stroke or transient neurologic deficit. She was started on Plavix at that time. In August of 2012 she was again hospitalized in [place] with shortness of breath and tachypalpitations. She was told that she was having episodes of irregular heart rhythms and she was offered possible invasive ablation therapy. The patient declined and stated that she wanted to followup with her primary cardiologist. She was seen in followup and transthoracic echocardiogram was performed 07/18/2011. This revealed mitral stenosis and mitral regurgitation with normal left ventricular systolic function. There was severe tricuspid regurgitation. The patient subsequently underwent transesophageal echocardiogram on 08/02/2011. 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 There was normal left ventricular size and function. There was severe tricuspid regurgitation. Left and right heart catheterization was also performed 08/02/2011. This confirmed the presence of moderate to severe mitral stenosis. There was pulmonary hypertension. There was normal coronary artery anatomy with no significant coronary artery disease. The patient has been referred to consider mitral valve replacement and tricuspid valve repair with or without a Maze procedure.     Past Medical History   Diagnosis  Date   .  Pulmonary hypertension         s/p RHC 4/10 - PAP 86/29, wedge 21-23   .  Squamous cell carcinoma         Left arm   .  Gastric ulcer with hemorrhage     .  Rheumatic fever         Childhood   .  Breast cancer         2008 s/p chemo and radiation (last tx 4/09) s/p left mastectomy s/p 9 lymph node resection - [name], [facility]   .  Coronary artery disease         Nonobstructive, LVEF 55%   .  Mitral regurgitation         Moderate   .  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  Stroke         12/11 [place]   .  Mitral stenosis with insufficiency, rheumatic         Past Surgical History   Procedure  Date   .  Left mastectomy     .  Partial gastrectomy         Family History  Problem  Relation  Age of Onset   .  Heart disease  Mother     .  Colon cancer  Mother       Social History History   Substance Use Topics   .  Smoking status:  Never Smoker    .  Smokeless tobacco:  Never Used   .  Alcohol Use:  No       Current Outpatient Prescriptions   Medication  Sig  Dispense  Refill   .  amitriptyline (ELAVIL) 75 MG tablet  2 tabs every evening           .  clopidogrel (PLAVIX) 75 MG tablet  Take 75 mg by mouth daily.           .  cyanocobalamin (,VITAMIN B-12,) 1000 MCG/ML injection  Inject 1,000 mcg into the muscle every 30 (thirty) days.           .  diphenhydrAMINE (BENADRYL) 25 MG tablet  Take 25  mg by mouth at bedtime.           .  folic acid (FOLVITE) 1 MG tablet  Take 2 tabs every morning         .  furosemide (LASIX) 40 MG tablet  Take 40 mg by mouth daily.           .  hydrocodone-acetaminophen (LORCET PLUS) 7.5-650 MG per tablet  May take up to three times daily as needed         .  letrozole (FEMARA) 2.5 MG tablet  Take 2.5 mg by mouth daily.           Marland Kitchen  levothyroxine (SYNTHROID, LEVOTHROID) 137 MCG tablet  Take 137 mcg by mouth daily.           .  metFORMIN (GLUCOPHAGE) 500 MG tablet  Take one tab every morning & 2 tabs with supper         .  metoprolol (LOPRESSOR) 50 MG tablet  Take 1 tablet by mouth Twice daily.         .  Multiple Vitamins-Minerals (CENTRUM SILVER PO)  Take 1 tablet by mouth daily.           .  nitroGLYCERIN (NITROSTAT) 0.4 MG SL tablet  Place 0.4 mg under the tongue every 5 (five) minutes as needed.           .  pantoprazole (PROTONIX) 40 MG tablet  Take 40 mg by mouth daily.           .  pirbuterol (MAXAIR) 200 MCG/INH inhaler  Inhale 2 puffs into the lungs 4 (four) times daily.           .  potassium chloride (KLOR-CON) 10 MEQ CR tablet  Take 10 mEq by mouth. 2 tabs am 1 tab pm         .  rosuvastatin (CRESTOR) 10 MG tablet  Take 10 mg by mouth daily.           .  valsartan (DIOVAN) 160 MG tablet  Take 160 mg by mouth daily.           Marland Kitchen  zolpidem (AMBIEN) 10 MG tablet  Take 10 mg by mouth at bedtime as needed.               Allergies   Allergen  Reactions   .  Butorphanol Tartrate         REACTION: migraines, nervous/agitated   .  Lipitor (Atorvastatin Calcium)  Diarrhea       nightmares   .  Revatio (Sildenafil Citrate)  Swelling     Review of Systems  Constitutional: Positive for activity change, fatigue and unexpected weight change.       The patient complains of worsening fatigue. She has had decreased activity. Associated with this she has gained 10-15 pounds over the last 2 months.  HENT: Positive for congestion and postnasal drip.          She has not seen a dentist in several years and she has a broken tooth that she feels probably needs to be extracted.  Eyes: Negative.   Respiratory: Positive for shortness of breath and wheezing.   Cardiovascular: Positive for palpitations and leg swelling. Negative for chest pain.        The patient describes a 2-3 year history of progressive symptoms of exertional shortness of breath. This has gotten particularly bad over the last several months such that now she gets short of breath with minimal physical activity. She denies orthopnea. She denies resting shortness of breath. She has not had exertional chest pain. She has had some occasional tachypalpitations without syncope. She has had mild lower extremity edema without significant abdominal bloating.  Gastrointestinal: Negative.   Genitourinary: Negative.   Musculoskeletal: Positive for back pain and arthralgias.       Diffuse chronic arthralgias and myalgias particularly afflicting her neck shoulders back in her right knee. However, she ambulates without assistance and her primary limitation is shortness of breath.  Neurological: Negative.   Hematological: Negative.   Psychiatric/Behavioral: Negative.     BP 121/63  Pulse 86  Resp 20  Ht 5\' 2"  (1.575 m)  Wt 165 lb (74.844 kg)  BMI 30.18 kg/m2  SpO2 98% Physical Exam  Vitals reviewed. Constitutional: She is oriented to person, place, and time. She appears well-developed.  HENT:   Head: Normocephalic.  Eyes: Pupils are equal, round, and reactive to light.  Neck: Normal range of motion. No JVD present.  Cardiovascular: Normal rate and regular rhythm.    Murmur heard.      There is a soft systolic murmur heard best along the left sternal border  Abdominal: Soft. Bowel sounds are normal. She exhibits no distension and no mass.  Musculoskeletal: Normal range of motion. She exhibits no edema.  Lymphadenopathy:    She has no cervical adenopathy.  Neurological: She is alert and  oriented to person, place, and time.  Skin: Skin is warm and dry.  Psychiatric: She has a normal mood and affect. Her behavior is normal. Judgment and thought content normal.     Diagnostic Tests:  Transesophageal echocardiogram performed 08/02/2011 is reviewed. This reveals findings consistent with rheumatic mitral valve disease. There is at least moderate mitral stenosis with valve area estimated 1.8 centimeters squared. There is at least moderate mitral regurgitation. The leaflets of the mitral valve are severely restricted during both systole and diastole. This corresponds to type IIIA mitral valve dysfunction as would be expected for rheumatic disease. The degree of mitral regurgitation is bad enough that balloon valvuloplasty probably would be unwise. Left ventricular size and function appears normal. There is severe left atrial enlargement. There is trace aortic insufficiency. There is severe tricuspid regurgitation. There is moderate right ventricular chamber enlargement. The dysfunction of the tricuspid valve appears to be purely functional, type I dysfunction, although the leaflets of the valve are not well visualized.  Left and right heart catheterization performed 08/02/2011 is  reviewed. This demonstrates normal coronary artery anatomy with no significant coronary artery disease. There is left dominant coronary circulation. Left ventricular systolic function appears normal. There is severe pulmonary hypertension with PA pressures measured 75/26 and mean pulmonary artery pressure 46. Pulmonary vascular resistance measured 4.3 Woods units. Pulmonary A wedge pressure was 20 but there were large V waves. Mean gradient across the mitral valve measured 14.4 mm mercury corresponding to calculated valve area 1.3 cm.  Pulmonary function tests performed 08/30/2011 are reviewed. Baseline FEV1 measured 1.43 L or 64% predicted. This increased slightly with bronchodilator therapy. The FEF 25-75 was 1.25 L  or 65% predicted. This did not improve with bronchodilator therapy. There was suggestion of hyperinflation with elevated residual volume. Diffusion capacity was moderately decreased.  CT angiogram of the chest abdomen and pelvis performed 08/30/2011 is reviewed. This reveals minimal atherosclerotic disease involving the thoracic and abdominal aorta and iliac vessels. The patient has changes consistent with previous partial gastrectomy. There is cardiomegaly. Chest CT scan is otherwise unchanged in comparison with previous CT scans and in particular there is no sign of any significant intrinsic lung disease. There is stable mediastinal lymphadenopathy that his chronic, unchanged, and undoubtedly reactive in nature.   Impression:  The patient has at least moderate mitral stenosis and moderate mitral regurgitation with severe secondary pulmonary hypertension related to congestive heart failure. The patient has secondary severe tricuspid regurgitation. Over the last few years the patient has had progressive worsening of symptoms of shortness of breath to the point where she now get short of breath with minimal physical activity. She was also hospitalized in August with episodes of transient supraventricular tachycardia. I agree that she would best be treated with mitral valve replacement and tricuspid valve repair. I think it would be reasonable to consider concomitant Maze procedure. Risks of surgery will be somewhat elevated because of the long-standing chronicity of this problem, the degree of pulmonary hypertension, and her limited functional status. The patient does not have a history of tobacco use and I doubt that she has significant underlying chronic obstructive pulmonary disease. However, her long-standing rheumatic mitral valve disease certainly might have resulted in the reversible pulmonary hypertension. However, at this point I would be hopeful that she would likely tolerate surgery and hopefully  enjoyed considerable symptomatic improvement postoperatively.  She underwent that dental extraction and has recovered uneventfully. She is ready to proceed with surgery as previously planned for Tuesday, November 13. We spent in excess of 30 minutes reviewing the indications, risks, and potential benefits of surgery. The patient specifically requests that we perform the operation using the right mini thoracotomy approach. We will plan to replace her mitral valve, and she specifically requests that we replace it using a bioprosthetic tissue valve. She understands that this comes with significant potential for late structural valve deterioration in failure depending upon her longevity. We will plan to per perform a Maze procedure and possible tricuspid valve repair if indicated. She understands and accepts all potential associated risks of surgery including but not limited to risk of death, stroke, myocardial infarction, congestive heart failure, respiratory failure, renal failure, bleeding requiring blood transfusion, arrhythmia, heart block or bradycardia requiring permanent pacemaker, late complications related to valve replacement or repair, late recurrence of atrial fibrillation, or a variety of complications related to the mini thoracotomy approach. She understands that her postoperative convalescence may be quite slow because of her baseline functional status and limited shortness of breath. Similarly, the overall risks of surgery are fairly  high because of her baseline comorbid medical problems with history of chronic congestive heart failure and pulmonary hypertension. All of her questions been addressed. She will begin amiodarone 200 mg twice daily to take between now and the time of surgery to decrease the risk of irregular heart rhythms following surgery.

## 2011-09-24 ENCOUNTER — Encounter (HOSPITAL_COMMUNITY): Payer: Self-pay | Admitting: Anesthesiology

## 2011-09-24 ENCOUNTER — Encounter (HOSPITAL_COMMUNITY): Payer: Self-pay | Admitting: *Deleted

## 2011-09-24 ENCOUNTER — Encounter (HOSPITAL_COMMUNITY)
Admission: RE | Disposition: A | Discharge status: Home Health | Payer: Self-pay | Source: Ambulatory Visit | Attending: Thoracic Surgery (Cardiothoracic Vascular Surgery)

## 2011-09-24 ENCOUNTER — Inpatient Hospital Stay (HOSPITAL_COMMUNITY): Payer: Medicare Other

## 2011-09-24 ENCOUNTER — Inpatient Hospital Stay (HOSPITAL_COMMUNITY)
Admission: RE | Admit: 2011-09-24 | Discharge: 2011-10-15 | DRG: 220 | Disposition: A | Discharge status: Home Health | Payer: Medicare Other | Source: Ambulatory Visit | Attending: Thoracic Surgery (Cardiothoracic Vascular Surgery) | Admitting: Thoracic Surgery (Cardiothoracic Vascular Surgery)

## 2011-09-24 ENCOUNTER — Inpatient Hospital Stay (HOSPITAL_COMMUNITY): Payer: Medicare Other | Admitting: Anesthesiology

## 2011-09-24 ENCOUNTER — Encounter (HOSPITAL_COMMUNITY): Payer: Self-pay | Admitting: Thoracic Surgery (Cardiothoracic Vascular Surgery)

## 2011-09-24 ENCOUNTER — Encounter: Payer: Self-pay | Admitting: Thoracic Surgery (Cardiothoracic Vascular Surgery)

## 2011-09-24 ENCOUNTER — Other Ambulatory Visit: Payer: Self-pay | Admitting: Thoracic Surgery (Cardiothoracic Vascular Surgery)

## 2011-09-24 DIAGNOSIS — I4891 Unspecified atrial fibrillation: Secondary | ICD-10-CM

## 2011-09-24 DIAGNOSIS — Z79899 Other long term (current) drug therapy: Secondary | ICD-10-CM

## 2011-09-24 DIAGNOSIS — Z8679 Personal history of other diseases of the circulatory system: Secondary | ICD-10-CM

## 2011-09-24 DIAGNOSIS — E8779 Other fluid overload: Secondary | ICD-10-CM | POA: Diagnosis not present

## 2011-09-24 DIAGNOSIS — Z9889 Other specified postprocedural states: Secondary | ICD-10-CM

## 2011-09-24 DIAGNOSIS — E119 Type 2 diabetes mellitus without complications: Secondary | ICD-10-CM | POA: Diagnosis present

## 2011-09-24 DIAGNOSIS — D72829 Elevated white blood cell count, unspecified: Secondary | ICD-10-CM | POA: Diagnosis not present

## 2011-09-24 DIAGNOSIS — Z8673 Personal history of transient ischemic attack (TIA), and cerebral infarction without residual deficits: Secondary | ICD-10-CM

## 2011-09-24 DIAGNOSIS — I071 Rheumatic tricuspid insufficiency: Secondary | ICD-10-CM | POA: Diagnosis present

## 2011-09-24 DIAGNOSIS — I471 Supraventricular tachycardia, unspecified: Secondary | ICD-10-CM | POA: Diagnosis present

## 2011-09-24 DIAGNOSIS — E039 Hypothyroidism, unspecified: Secondary | ICD-10-CM | POA: Diagnosis present

## 2011-09-24 DIAGNOSIS — I052 Rheumatic mitral stenosis with insufficiency: Principal | ICD-10-CM | POA: Diagnosis present

## 2011-09-24 DIAGNOSIS — K219 Gastro-esophageal reflux disease without esophagitis: Secondary | ICD-10-CM | POA: Diagnosis present

## 2011-09-24 DIAGNOSIS — I059 Rheumatic mitral valve disease, unspecified: Secondary | ICD-10-CM

## 2011-09-24 DIAGNOSIS — I2789 Other specified pulmonary heart diseases: Secondary | ICD-10-CM | POA: Diagnosis present

## 2011-09-24 DIAGNOSIS — Z7902 Long term (current) use of antithrombotics/antiplatelets: Secondary | ICD-10-CM

## 2011-09-24 DIAGNOSIS — I10 Essential (primary) hypertension: Secondary | ICD-10-CM | POA: Diagnosis present

## 2011-09-24 DIAGNOSIS — K5900 Constipation, unspecified: Secondary | ICD-10-CM | POA: Diagnosis not present

## 2011-09-24 DIAGNOSIS — I5032 Chronic diastolic (congestive) heart failure: Secondary | ICD-10-CM | POA: Diagnosis present

## 2011-09-24 DIAGNOSIS — IMO0001 Reserved for inherently not codable concepts without codable children: Secondary | ICD-10-CM | POA: Diagnosis present

## 2011-09-24 DIAGNOSIS — D62 Acute posthemorrhagic anemia: Secondary | ICD-10-CM | POA: Diagnosis not present

## 2011-09-24 DIAGNOSIS — Z952 Presence of prosthetic heart valve: Secondary | ICD-10-CM

## 2011-09-24 DIAGNOSIS — N289 Disorder of kidney and ureter, unspecified: Secondary | ICD-10-CM | POA: Diagnosis not present

## 2011-09-24 DIAGNOSIS — I079 Rheumatic tricuspid valve disease, unspecified: Secondary | ICD-10-CM

## 2011-09-24 DIAGNOSIS — I509 Heart failure, unspecified: Secondary | ICD-10-CM | POA: Diagnosis present

## 2011-09-24 DIAGNOSIS — I272 Pulmonary hypertension, unspecified: Secondary | ICD-10-CM | POA: Diagnosis present

## 2011-09-24 DIAGNOSIS — Z85828 Personal history of other malignant neoplasm of skin: Secondary | ICD-10-CM

## 2011-09-24 DIAGNOSIS — C50919 Malignant neoplasm of unspecified site of unspecified female breast: Secondary | ICD-10-CM | POA: Diagnosis present

## 2011-09-24 HISTORY — PX: MAZE: SHX5063

## 2011-09-24 HISTORY — PX: MITRAL VALVE REPLACEMENT: SHX147

## 2011-09-24 HISTORY — PX: TRICUSPID VALVULOPLASTY: SHX846

## 2011-09-24 LAB — PLATELET COUNT: Platelets: 108 10*3/uL — ABNORMAL LOW (ref 150–400)

## 2011-09-24 LAB — POCT I-STAT 3, ART BLOOD GAS (G3+)
Acid-base deficit: 1 mmol/L (ref 0.0–2.0)
Acid-base deficit: 2 mmol/L (ref 0.0–2.0)
Acid-base deficit: 3 mmol/L — ABNORMAL HIGH (ref 0.0–2.0)
Bicarbonate: 23 mEq/L (ref 20.0–24.0)
Bicarbonate: 20 mEq/L (ref 20.0–24.0)
Bicarbonate: 24.9 mEq/L — ABNORMAL HIGH (ref 20.0–24.0)
Bicarbonate: 20.9 mEq/L (ref 20.0–24.0)
O2 Saturation: 100 %
O2 Saturation: 98 %
Patient temperature: 36.3
Patient temperature: 36.5
Patient temperature: 36.9
TCO2: 24 mmol/L (ref 0–100)
TCO2: 21 mmol/L (ref 0–100)
TCO2: 22 mmol/L (ref 0–100)
pCO2 arterial: 38.1 mmHg (ref 35.0–45.0)
pH, Arterial: 7.389 (ref 7.350–7.400)
pH, Arterial: 7.245 — ABNORMAL LOW (ref 7.350–7.400)
pH, Arterial: 7.25 — ABNORMAL LOW (ref 7.350–7.400)
pO2, Arterial: 248 mmHg — ABNORMAL HIGH (ref 80.0–100.0)
pO2, Arterial: 62 mmHg — ABNORMAL LOW (ref 80.0–100.0)
pO2, Arterial: 68 mmHg — ABNORMAL LOW (ref 80.0–100.0)

## 2011-09-24 LAB — POCT I-STAT 4, (NA,K, GLUC, HGB,HCT)
Glucose, Bld: 109 mg/dL — ABNORMAL HIGH (ref 70–99)
Glucose, Bld: 162 mg/dL — ABNORMAL HIGH (ref 70–99)
Glucose, Bld: 118 mg/dL — ABNORMAL HIGH (ref 70–99)
HCT: 28 % — ABNORMAL LOW (ref 36.0–46.0)
Hemoglobin: 9.9 g/dL — ABNORMAL LOW (ref 12.0–15.0)
Hemoglobin: 6.8 g/dL — CL (ref 12.0–15.0)
Hemoglobin: 8.2 g/dL — ABNORMAL LOW (ref 12.0–15.0)
Hemoglobin: 8.8 g/dL — ABNORMAL LOW (ref 12.0–15.0)
Hemoglobin: 9.9 g/dL — ABNORMAL LOW (ref 12.0–15.0)
Potassium: 4 mEq/L (ref 3.5–5.1)
Potassium: 5.2 mEq/L — ABNORMAL HIGH (ref 3.5–5.1)
Potassium: 3.9 mEq/L (ref 3.5–5.1)
Potassium: 4.2 mEq/L (ref 3.5–5.1)
Sodium: 139 mEq/L (ref 135–145)
Sodium: 139 mEq/L (ref 135–145)
Sodium: 136 mEq/L (ref 135–145)
Sodium: 138 mEq/L (ref 135–145)
Sodium: 142 mEq/L (ref 135–145)
Sodium: 143 mEq/L (ref 135–145)

## 2011-09-24 LAB — DIC (DISSEMINATED INTRAVASCULAR COAGULATION)PANEL
D-Dimer, Quant: 0.94 ug/mL-FEU — ABNORMAL HIGH (ref 0.00–0.48)
INR: 1.69 — ABNORMAL HIGH (ref 0.00–1.49)
Platelets: 125 10*3/uL — ABNORMAL LOW (ref 150–400)

## 2011-09-24 LAB — CBC
HCT: 17.8 % — ABNORMAL LOW (ref 36.0–46.0)
HCT: 27.6 % — ABNORMAL LOW (ref 36.0–46.0)
Hemoglobin: 6.1 g/dL — CL (ref 12.0–15.0)
Hemoglobin: 9.3 g/dL — ABNORMAL LOW (ref 12.0–15.0)
Hemoglobin: 9.5 g/dL — ABNORMAL LOW (ref 12.0–15.0)
MCH: 29.5 pg (ref 26.0–34.0)
MCH: 29 pg (ref 26.0–34.0)
MCH: 28.3 pg (ref 26.0–34.0)
MCHC: 34.3 g/dL (ref 30.0–36.0)
MCHC: 33.2 g/dL (ref 30.0–36.0)
Platelets: 125 10*3/uL — ABNORMAL LOW (ref 150–400)
RBC: 3.21 MIL/uL — ABNORMAL LOW (ref 3.87–5.11)
RDW: 14.9 % (ref 11.5–15.5)

## 2011-09-24 LAB — POCT I-STAT, CHEM 8
BUN: 22 mg/dL (ref 6–23)
Creatinine, Ser: 0.9 mg/dL (ref 0.50–1.10)
Sodium: 142 mEq/L (ref 135–145)
TCO2: 20 mmol/L (ref 0–100)

## 2011-09-24 LAB — CREATININE, SERUM: Creatinine, Ser: 0.93 mg/dL (ref 0.50–1.10)

## 2011-09-24 LAB — PROTIME-INR
INR: 1.7 — ABNORMAL HIGH (ref 0.00–1.49)
Prothrombin Time: 20.3 seconds — ABNORMAL HIGH (ref 11.6–15.2)

## 2011-09-24 LAB — HEMOGLOBIN AND HEMATOCRIT, BLOOD: Hemoglobin: 7.9 g/dL — ABNORMAL LOW (ref 12.0–15.0)

## 2011-09-24 SURGERY — TRICUSPID VALVE REPAIR
Anesthesia: General | Site: Chest | Laterality: Right | Wound class: Clean

## 2011-09-24 MED ORDER — PANTOPRAZOLE SODIUM 40 MG PO TBEC
40.0000 mg | DELAYED_RELEASE_TABLET | Freq: Every day | ORAL | Status: DC
  Administered 2011-09-26 – 2011-10-15 (×19): 40 mg via ORAL
  Filled 2011-09-24 (×19): qty 1

## 2011-09-24 MED ORDER — ALBUMIN HUMAN 5 % IV SOLN
250.0000 mL | INTRAVENOUS | Status: DC | PRN
  Administered 2011-09-24: 250 mL via INTRAVENOUS

## 2011-09-24 MED ORDER — VANCOMYCIN HCL 1000 MG IV SOLR
1000.0000 mg | INTRAVENOUS | Status: DC | PRN
  Administered 2011-09-24: 1.25 g via INTRAVENOUS

## 2011-09-24 MED ORDER — HETASTARCH-ELECTROLYTES 6 % IV SOLN
INTRAVENOUS | Status: DC | PRN
  Administered 2011-09-24: 14:00:00 via INTRAVENOUS

## 2011-09-24 MED ORDER — NOREPINEPHRINE BITARTRATE 1 MG/ML IJ SOLN
2.0000 ug/min | INTRAVENOUS | Status: DC
  Administered 2011-09-25: 5 ug/min via INTRAVENOUS
  Administered 2011-09-27: 3 ug/min via INTRAVENOUS
  Filled 2011-09-24 (×5): qty 4

## 2011-09-24 MED ORDER — BISACODYL 10 MG RE SUPP: 10.0000 mg | Freq: Every day | RECTAL | Status: DC

## 2011-09-24 MED ORDER — INSULIN ASPART 100 UNIT/ML ~~LOC~~ SOLN
0.0000 [IU] | SUBCUTANEOUS | Status: AC
  Administered 2011-09-24: 2 [IU] via SUBCUTANEOUS
  Filled 2011-09-24: qty 3

## 2011-09-24 MED ORDER — CALCIUM CHLORIDE 10 % IV SOLN
INTRAVENOUS | Status: DC | PRN
  Administered 2011-09-24: .4 g via INTRAVENOUS
  Administered 2011-09-24 (×4): .2 g via INTRAVENOUS

## 2011-09-24 MED ORDER — ALBUTEROL SULFATE (5 MG/ML) 0.5% IN NEBU
2.5000 mg | INHALATION_SOLUTION | RESPIRATORY_TRACT | Status: DC | PRN
  Administered 2011-09-24 – 2011-10-01 (×12): 2.5 mg via RESPIRATORY_TRACT
  Filled 2011-09-24 (×12): qty 0.5

## 2011-09-24 MED ORDER — POTASSIUM CHLORIDE 10 MEQ/50ML IV SOLN: 10.0000 meq | INTRAVENOUS | Status: AC

## 2011-09-24 MED ORDER — ACETAMINOPHEN 500 MG PO TABS
1000.0000 mg | ORAL_TABLET | Freq: Four times a day (QID) | ORAL | Status: AC
  Administered 2011-09-26 – 2011-09-29 (×14): 1000 mg via ORAL
  Filled 2011-09-24 (×21): qty 2

## 2011-09-24 MED ORDER — SODIUM CHLORIDE 0.9 % IJ SOLN: 3.0000 mL | INTRAMUSCULAR | Status: DC | PRN

## 2011-09-24 MED ORDER — DOPAMINE-DEXTROSE 3.2-5 MG/ML-% IV SOLN
INTRAVENOUS | Status: DC | PRN
  Administered 2011-09-24: 3 ug/kg/min via INTRAVENOUS

## 2011-09-24 MED ORDER — DEXTROSE 5 % IV SOLN
1.5000 g | Freq: Two times a day (BID) | INTRAVENOUS | Status: AC
  Administered 2011-09-24 – 2011-09-26 (×4): 1.5 g via INTRAVENOUS
  Filled 2011-09-24 (×5): qty 1.5

## 2011-09-24 MED ORDER — FENTANYL CITRATE 0.05 MG/ML IJ SOLN: INTRAMUSCULAR | Status: DC | PRN

## 2011-09-24 MED ORDER — PROPOFOL 10 MG/ML IV EMUL
INTRAVENOUS | Status: DC | PRN
  Administered 2011-09-24: 40 mg via INTRAVENOUS

## 2011-09-24 MED ORDER — VANCOMYCIN HCL 1000 MG IV SOLR
1000.0000 mg | Freq: Once | INTRAVENOUS | Status: AC
  Administered 2011-09-24: 1000 mg via INTRAVENOUS
  Filled 2011-09-24: qty 1000

## 2011-09-24 MED ORDER — LACTATED RINGERS IV SOLN
INTRAVENOUS | Status: DC | PRN
  Administered 2011-09-24: 07:00:00 via INTRAVENOUS

## 2011-09-24 MED ORDER — CHLORHEXIDINE GLUCONATE 0.12 % MT SOLN
15.0000 mL | Freq: Two times a day (BID) | OROMUCOSAL | Status: DC
  Administered 2011-09-24 – 2011-10-15 (×37): 15 mL via OROMUCOSAL
  Filled 2011-09-24 (×40): qty 15

## 2011-09-24 MED ORDER — DOPAMINE-DEXTROSE 3.2-5 MG/ML-% IV SOLN: 2.0000 ug/kg/min | INTRAVENOUS | Status: DC

## 2011-09-24 MED ORDER — MILRINONE IN DEXTROSE 200-5 MCG/ML-% IV SOLN
0.2000 ug/kg/min | INTRAVENOUS | Status: DC
  Administered 2011-09-25: 0.2 ug/kg/min via INTRAVENOUS
  Filled 2011-09-24: qty 100

## 2011-09-24 MED ORDER — LACTATED RINGERS IV SOLN: 500.0000 mL | Freq: Once | INTRAVENOUS | Status: AC | PRN

## 2011-09-24 MED ORDER — SODIUM CHLORIDE 0.9 % IV SOLN: 10.0000 g | INTRAVENOUS | Status: DC | PRN

## 2011-09-24 MED ORDER — SODIUM CHLORIDE 0.9 % IJ SOLN
3.0000 mL | Freq: Two times a day (BID) | INTRAMUSCULAR | Status: DC
  Administered 2011-09-25 – 2011-09-27 (×4): 3 mL via INTRAVENOUS

## 2011-09-24 MED ORDER — SODIUM CHLORIDE 0.9 % IV SOLN
INTRAVENOUS | Status: DC | PRN
  Administered 2011-09-24 (×2): via INTRAVENOUS

## 2011-09-24 MED ORDER — METOPROLOL TARTRATE 1 MG/ML IV SOLN: 2.5000 mg | INTRAVENOUS | Status: DC | PRN

## 2011-09-24 MED ORDER — ACETAMINOPHEN 650 MG RE SUPP: 650.0000 mg | RECTAL | Status: AC

## 2011-09-24 MED ORDER — METOPROLOL TARTRATE 12.5 MG HALF TABLET
12.5000 mg | ORAL_TABLET | Freq: Two times a day (BID) | ORAL | Status: DC
  Filled 2011-09-24 (×5): qty 1

## 2011-09-24 MED ORDER — SODIUM CHLORIDE 0.9 % IV SOLN
INTRAVENOUS | Status: DC
  Administered 2011-09-26: via INTRAVENOUS
  Administered 2011-09-27: 20 mL via INTRAVENOUS

## 2011-09-24 MED ORDER — SODIUM CHLORIDE 0.9 % IV SOLN
INTRAVENOUS | Status: DC
  Administered 2011-09-25: 02:00:00 via INTRAVENOUS
  Filled 2011-09-24 (×2): qty 1

## 2011-09-24 MED ORDER — ACETAMINOPHEN 160 MG/5ML PO SOLN
650.0000 mg | ORAL | Status: AC
  Filled 2011-09-24: qty 20.3

## 2011-09-24 MED ORDER — METOPROLOL TARTRATE 25 MG/10 ML ORAL SUSPENSION
12.5000 mg | Freq: Two times a day (BID) | ORAL | Status: DC
  Filled 2011-09-24 (×5): qty 5

## 2011-09-24 MED ORDER — FAMOTIDINE IN NACL 20-0.9 MG/50ML-% IV SOLN
20.0000 mg | Freq: Two times a day (BID) | INTRAVENOUS | Status: AC
  Administered 2011-09-24 – 2011-09-25 (×2): 20 mg via INTRAVENOUS
  Filled 2011-09-24 (×2): qty 50

## 2011-09-24 MED ORDER — SODIUM CHLORIDE 0.9 % IR SOLN
Status: DC | PRN
  Administered 2011-09-24: 4000 mL

## 2011-09-24 MED ORDER — HEMOSTATIC AGENTS (NO CHARGE) OPTIME
TOPICAL | Status: DC | PRN
  Administered 2011-09-24: 1 via TOPICAL

## 2011-09-24 MED ORDER — ROCURONIUM BROMIDE 100 MG/10ML IV SOLN
INTRAVENOUS | Status: DC | PRN
  Administered 2011-09-24 (×5): 50 mg via INTRAVENOUS

## 2011-09-24 MED ORDER — DOPAMINE-DEXTROSE 3.2-5 MG/ML-% IV SOLN
2.5000 ug/kg/min | INTRAVENOUS | Status: DC
  Administered 2011-09-24: 5 ug/kg/min via INTRAVENOUS

## 2011-09-24 MED ORDER — DEXTROSE 5 % IV SOLN
1.5000 g | INTRAVENOUS | Status: DC | PRN
  Administered 2011-09-24: .75 g via INTRAVENOUS
  Administered 2011-09-24: 1.5 g via INTRAVENOUS

## 2011-09-24 MED ORDER — SODIUM CHLORIDE 0.45 % IV SOLN
INTRAVENOUS | Status: DC
  Administered 2011-09-24: 17:00:00 via INTRAVENOUS

## 2011-09-24 MED ORDER — MIDAZOLAM HCL 5 MG/ML IJ SOLN: INTRAMUSCULAR | Status: DC | PRN

## 2011-09-24 MED ORDER — ONDANSETRON HCL 4 MG/2ML IJ SOLN
4.0000 mg | Freq: Four times a day (QID) | INTRAMUSCULAR | Status: DC | PRN
  Administered 2011-09-25 – 2011-09-28 (×3): 4 mg via INTRAVENOUS
  Filled 2011-09-24 (×3): qty 2

## 2011-09-24 MED ORDER — NITROGLYCERIN IN D5W 200-5 MCG/ML-% IV SOLN: 0.0000 ug/min | INTRAVENOUS | Status: DC

## 2011-09-24 MED ORDER — SODIUM CHLORIDE 0.9 % IV SOLN
100.0000 [IU] | INTRAVENOUS | Status: DC | PRN
  Administered 2011-09-24: 1.7 [IU]/h via INTRAVENOUS

## 2011-09-24 MED ORDER — MORPHINE SULFATE 4 MG/ML IJ SOLN
2.0000 mg | INTRAMUSCULAR | Status: DC | PRN
  Administered 2011-09-24 – 2011-09-25 (×11): 4 mg via INTRAVENOUS
  Filled 2011-09-24: qty 1
  Filled 2011-09-24: qty 4
  Filled 2011-09-24 (×9): qty 1

## 2011-09-24 MED ORDER — VECURONIUM BROMIDE 10 MG IV SOLR: INTRAVENOUS | Status: DC | PRN

## 2011-09-24 MED ORDER — PHENYLEPHRINE HCL 10 MG/ML IJ SOLN
0.0000 ug/min | INTRAVENOUS | Status: DC
  Filled 2011-09-24: qty 2

## 2011-09-24 MED ORDER — NOREPINEPHRINE BITARTRATE 1 MG/ML IJ SOLN
4.0000 mg | INTRAVENOUS | Status: DC | PRN
  Administered 2011-09-24: 2.67 ug/min via INTRAVENOUS

## 2011-09-24 MED ORDER — LACTATED RINGERS IV SOLN
INTRAVENOUS | Status: DC | PRN
  Administered 2011-09-24: 08:00:00 via INTRAVENOUS

## 2011-09-24 MED ORDER — ACETAMINOPHEN 160 MG/5ML PO SOLN
975.0000 mg | Freq: Four times a day (QID) | ORAL | Status: AC
  Administered 2011-09-25 (×2): 975 mg
  Filled 2011-09-24: qty 20.3
  Filled 2011-09-24: qty 40.6
  Filled 2011-09-24: qty 20.3

## 2011-09-24 MED ORDER — DOPAMINE-DEXTROSE 3.2-5 MG/ML-% IV SOLN
2.0000 ug/kg/min | INTRAVENOUS | Status: DC
  Administered 2011-09-24 – 2011-09-26 (×4): 5 ug/kg/min via INTRAVENOUS
  Administered 2011-09-26: 4 ug/kg/min via INTRAVENOUS
  Administered 2011-09-26: 5 ug/kg/min via INTRAVENOUS
  Administered 2011-09-27 (×2): 4 ug/kg/min via INTRAVENOUS
  Filled 2011-09-24 (×2): qty 250

## 2011-09-24 MED ORDER — PROTAMINE SULFATE 10 MG/ML IV SOLN
INTRAVENOUS | Status: DC | PRN
  Administered 2011-09-24: 70 mg via INTRAVENOUS
  Administered 2011-09-24: 100 mg via INTRAVENOUS
  Administered 2011-09-24: 30 mg via INTRAVENOUS

## 2011-09-24 MED ORDER — ASPIRIN EC 325 MG PO TBEC
325.0000 mg | DELAYED_RELEASE_TABLET | Freq: Every day | ORAL | Status: DC
  Administered 2011-09-26 – 2011-09-29 (×4): 325 mg via ORAL
  Filled 2011-09-24 (×5): qty 1

## 2011-09-24 MED ORDER — MAGNESIUM SULFATE 50 % IJ SOLN
4.0000 g | Freq: Once | INTRAVENOUS | Status: AC
  Administered 2011-09-24: 4 g via INTRAVENOUS
  Filled 2011-09-24: qty 8

## 2011-09-24 MED ORDER — MILRINONE IN DEXTROSE 200-5 MCG/ML-% IV SOLN
INTRAVENOUS | Status: DC | PRN
  Administered 2011-09-24: .2 ug/kg/min via INTRAVENOUS

## 2011-09-24 MED ORDER — OXYCODONE HCL 5 MG PO TABS
5.0000 mg | ORAL_TABLET | ORAL | Status: DC | PRN
  Administered 2011-09-25 – 2011-09-26 (×5): 10 mg via ORAL
  Administered 2011-09-26: 5 mg via ORAL
  Administered 2011-09-26: 10 mg via ORAL
  Administered 2011-09-27: 5 mg via ORAL
  Administered 2011-09-27 (×2): 10 mg via ORAL
  Administered 2011-09-27 (×2): 5 mg via ORAL
  Administered 2011-09-28 – 2011-10-15 (×62): 10 mg via ORAL
  Filled 2011-09-24 (×7): qty 2
  Filled 2011-09-24: qty 1
  Filled 2011-09-24 (×28): qty 2
  Filled 2011-09-24: qty 1
  Filled 2011-09-24 (×5): qty 2
  Filled 2011-09-24: qty 1
  Filled 2011-09-24 (×23): qty 2
  Filled 2011-09-24: qty 1
  Filled 2011-09-24 (×8): qty 2

## 2011-09-24 MED ORDER — NITROGLYCERIN IN D5W 200-5 MCG/ML-% IV SOLN
INTRAVENOUS | Status: DC | PRN
  Administered 2011-09-24: 33.3 ug/min via INTRAVENOUS

## 2011-09-24 MED ORDER — MORPHINE SULFATE 2 MG/ML IJ SOLN: 1.0000 mg | INTRAMUSCULAR | Status: AC | PRN

## 2011-09-24 MED ORDER — INSULIN ASPART 100 UNIT/ML ~~LOC~~ SOLN: 0.0000 [IU] | SUBCUTANEOUS | Status: DC

## 2011-09-24 MED ORDER — SODIUM CHLORIDE 0.9 % IJ SOLN
OROMUCOSAL | Status: DC | PRN
  Administered 2011-09-24: 17:00:00 via TOPICAL

## 2011-09-24 MED ORDER — FENTANYL CITRATE 0.05 MG/ML IJ SOLN
INTRAMUSCULAR | Status: DC | PRN
  Administered 2011-09-24: 100 ug via INTRAVENOUS
  Administered 2011-09-24: 150 ug via INTRAVENOUS
  Administered 2011-09-24: 1150 ug via INTRAVENOUS
  Administered 2011-09-24: 100 ug via INTRAVENOUS

## 2011-09-24 MED ORDER — LACTATED RINGERS IV SOLN
INTRAVENOUS | Status: DC | PRN
  Administered 2011-09-24 (×2): via INTRAVENOUS

## 2011-09-24 MED ORDER — ALBUMIN HUMAN 5 % IV SOLN
INTRAVENOUS | Status: DC | PRN
  Administered 2011-09-24 (×3): via INTRAVENOUS

## 2011-09-24 MED ORDER — MIDAZOLAM HCL 5 MG/5ML IJ SOLN
INTRAMUSCULAR | Status: DC | PRN
  Administered 2011-09-24: 2 mg via INTRAVENOUS
  Administered 2011-09-24: 3 mg via INTRAVENOUS
  Administered 2011-09-24: 4 mg via INTRAVENOUS
  Administered 2011-09-24: 1 mg via INTRAVENOUS
  Administered 2011-09-24: 2 mg via INTRAVENOUS

## 2011-09-24 MED ORDER — DOCUSATE SODIUM 100 MG PO CAPS
200.0000 mg | ORAL_CAPSULE | Freq: Every day | ORAL | Status: DC
  Administered 2011-09-25 – 2011-10-15 (×19): 200 mg via ORAL
  Filled 2011-09-24 (×21): qty 2

## 2011-09-24 MED ORDER — HEPARIN SODIUM (PORCINE) 1000 UNIT/ML IJ SOLN
INTRAMUSCULAR | Status: DC | PRN
  Administered 2011-09-24: 23000 [IU] via INTRAVENOUS

## 2011-09-24 MED ORDER — SODIUM BICARBONATE 8.4 % IV SOLN
INTRAVENOUS | Status: DC | PRN
  Administered 2011-09-24: 50 meq via INTRAVENOUS

## 2011-09-24 MED ORDER — SODIUM CHLORIDE 0.9 % IV SOLN
10.0000 g | INTRAVENOUS | Status: DC | PRN
  Administered 2011-09-24: 5 g/h via INTRAVENOUS

## 2011-09-24 MED ORDER — SODIUM CHLORIDE 0.9 % IV SOLN
0.1000 ug/kg/h | INTRAVENOUS | Status: DC
  Administered 2011-09-24: 0.3 ug/kg/h via INTRAVENOUS
  Filled 2011-09-24 (×2): qty 2

## 2011-09-24 MED ORDER — MIDAZOLAM HCL 2 MG/2ML IJ SOLN: 2.0000 mg | INTRAMUSCULAR | Status: DC | PRN

## 2011-09-24 MED ORDER — SODIUM CHLORIDE 0.9 % IV SOLN
200.0000 ug | INTRAVENOUS | Status: DC | PRN
  Administered 2011-09-24: 0.2 ug/kg/h via INTRAVENOUS

## 2011-09-24 MED ORDER — SODIUM CHLORIDE 0.9 % IV SOLN: 250.0000 mL | INTRAVENOUS | Status: DC

## 2011-09-24 MED ORDER — BISACODYL 5 MG PO TBEC
10.0000 mg | DELAYED_RELEASE_TABLET | Freq: Every day | ORAL | Status: DC
  Administered 2011-09-25 – 2011-10-15 (×19): 10 mg via ORAL
  Filled 2011-09-24: qty 2
  Filled 2011-09-24: qty 1
  Filled 2011-09-24 (×9): qty 2
  Filled 2011-09-24: qty 1
  Filled 2011-09-24 (×6): qty 2

## 2011-09-24 MED ORDER — LACTATED RINGERS IV SOLN: INTRAVENOUS | Status: DC

## 2011-09-24 MED ORDER — ASPIRIN 81 MG PO CHEW: 324.0000 mg | CHEWABLE_TABLET | Freq: Every day | ORAL | Status: DC

## 2011-09-24 MED ORDER — PHENYLEPHRINE HCL 10 MG/ML IJ SOLN
10000.0000 ug | INTRAVENOUS | Status: DC | PRN
  Administered 2011-09-24: 10 ug/min via INTRAVENOUS

## 2011-09-24 SURGICAL SUPPLY — 161 items
ADAPTER CARDIO PERF ANTE/RETRO (ADAPTER) ×3 IMPLANT
APPLICATOR COTTON TIP 6IN STRL (MISCELLANEOUS) IMPLANT
ATTRACTOMAT 16X20 MAGNETIC DRP (DRAPES) ×6 IMPLANT
BAG DECANTER FOR FLEXI CONT (MISCELLANEOUS) ×6 IMPLANT
BENZOIN TINCTURE PRP APPL 2/3 (GAUZE/BANDAGES/DRESSINGS) ×3 IMPLANT
BLADE STERNUM SYSTEM 6 (BLADE) ×6 IMPLANT
BLADE SURG 11 STRL SS (BLADE) ×6 IMPLANT
CANISTER SUCTION 2500CC (MISCELLANEOUS) ×9 IMPLANT
CANNULA ANTEGRADE CPL6 STR 12 (CANNULA) ×3 IMPLANT
CANNULA FEM VENOUS REMOTE 22FR (CANNULA) IMPLANT
CANNULA FEMORAL ART 14 SM (MISCELLANEOUS) ×3 IMPLANT
CANNULA GUNDRY RCSP 15FR (MISCELLANEOUS) ×3 IMPLANT
CANNULA OPTISITE PERFUSION 16F (CANNULA) IMPLANT
CANNULA OPTISITE PERFUSION 18F (CANNULA) IMPLANT
CANNULA SOFTFLOW AORTIC 7M21FR (CANNULA) ×3 IMPLANT
CANNULA VENNOUS METAL TIP 20FR (CANNULA) ×3 IMPLANT
CARDIAC SUCTION (MISCELLANEOUS) ×3 IMPLANT
CARDIOBLATE CARDIAC ABLATION (MISCELLANEOUS)
CATH AORTIC ROOT ×3 IMPLANT
CATH ROBINSON RED A/P 18FR (CATHETERS) ×3 IMPLANT
CATH THORACIC 28FR RT ANG (CATHETERS) IMPLANT
CATH THORACIC 36FR (CATHETERS) ×3 IMPLANT
CATH THORACIC 36FR RT ANG (CATHETERS) ×3 IMPLANT
CLAMP ISOLATOR SYNERGY LG (MISCELLANEOUS) ×3 IMPLANT
CLIP FOGARTY SPRING 6M (CLIP) IMPLANT
CLIP TI MEDIUM 24 (CLIP) ×3 IMPLANT
CLOTH BEACON ORANGE TIMEOUT ST (SAFETY) ×6 IMPLANT
CONN 1/2X1/2X1/2  BEN (MISCELLANEOUS) ×1
CONN 1/2X1/2X1/2 BEN (MISCELLANEOUS) ×2 IMPLANT
CONN 3/8X1/2 ST GISH (MISCELLANEOUS) ×9 IMPLANT
CONN 3/8X3/8 GISH STERILE (MISCELLANEOUS) ×3 IMPLANT
CONN ST 1/4X3/8  BEN (MISCELLANEOUS) ×2
CONN ST 1/4X3/8 BEN (MISCELLANEOUS) ×4 IMPLANT
CONT SPEC STER OR (MISCELLANEOUS) ×3 IMPLANT
COVER MAYO STAND STRL (DRAPES) ×3 IMPLANT
COVER SURGICAL LIGHT HANDLE (MISCELLANEOUS) ×12 IMPLANT
COVER TABLE BACK 60X90 (DRAPES) ×3 IMPLANT
CRADLE DONUT ADULT HEAD (MISCELLANEOUS) ×3 IMPLANT
DERMABOND ADVANCED (GAUZE/BANDAGES/DRESSINGS) ×1
DERMABOND ADVANCED .7 DNX12 (GAUZE/BANDAGES/DRESSINGS) ×2 IMPLANT
DEVICE CARDIOBLATE CARDIAC ABL (MISCELLANEOUS) IMPLANT
DEVICE TROCAR PUNCTURE CLOSURE (ENDOMECHANICALS) ×3 IMPLANT
DRAIN CHANNEL 28F RND 3/8 FF (WOUND CARE) ×3 IMPLANT
DRAPE BILATERAL SPLIT (DRAPES) ×3 IMPLANT
DRAPE C-ARM 42X72 X-RAY (DRAPES) ×3 IMPLANT
DRAPE CARDIOVASCULAR INCISE (DRAPES) ×1
DRAPE CV SPLIT W-CLR ANES SCRN (DRAPES) ×3 IMPLANT
DRAPE INCISE IOBAN 66X45 STRL (DRAPES) ×6 IMPLANT
DRAPE SLUSH/WARMER DISC (DRAPES) ×3 IMPLANT
DRAPE SRG 135X102X78XABS (DRAPES) ×2 IMPLANT
DRSG COVADERM 4X14 (GAUZE/BANDAGES/DRESSINGS) IMPLANT
ELECT BLADE 6.5 EXT (BLADE) ×3 IMPLANT
ELECT REM PT RETURN 9FT ADLT (ELECTROSURGICAL) ×6
ELECTRODE REM PT RTRN 9FT ADLT (ELECTROSURGICAL) ×4 IMPLANT
FEMORAL VENOUS CANN RAP (CANNULA) IMPLANT
GLOVE BIO SURGEON STRL SZ 6 (GLOVE) ×3 IMPLANT
GLOVE BIO SURGEON STRL SZ 6.5 (GLOVE) ×6 IMPLANT
GLOVE BIO SURGEON STRL SZ7 (GLOVE) IMPLANT
GLOVE BIO SURGEON STRL SZ7.5 (GLOVE) IMPLANT
GLOVE ORTHO TXT STRL SZ7.5 (GLOVE) ×12 IMPLANT
GOWN STRL NON-REIN LRG LVL3 (GOWN DISPOSABLE) ×27 IMPLANT
GRAFT PTCH CORMATRIX 7X10 4PLY (Prosthesis & Implant Heart) ×3 IMPLANT
GUIDEWIRE ANG ZIPWIRE 038X150 (WIRE) ×3 IMPLANT
HEART VALVE MOSAIC PORCINE (Prosthesis & Implant Heart) ×3 IMPLANT
HEMOSTAT POWDER SURGIFOAM 1G (HEMOSTASIS) ×12 IMPLANT
HEMOSTAT SURGICEL 2X4 FIBR (HEMOSTASIS) ×3 IMPLANT
INSERT CONFORM CROSS CLAMP 66M (MISCELLANEOUS) ×3 IMPLANT
INSERT CONFORM CROSS CLAMP 86M (MISCELLANEOUS) ×3 IMPLANT
INSERT FOGARTY 61MM (MISCELLANEOUS) IMPLANT
INSERT FOGARTY XLG (MISCELLANEOUS) ×3 IMPLANT
KIT BASIN OR (CUSTOM PROCEDURE TRAY) ×6 IMPLANT
KIT DILATOR VASC 18G NDL (KITS) ×3 IMPLANT
KIT DRAINAGE VACCUM ASSIST (KITS) ×3 IMPLANT
KIT PAIN CUSTOM (MISCELLANEOUS) IMPLANT
KIT ROOM TURNOVER OR (KITS) ×6 IMPLANT
KIT SUCTION CATH 14FR (SUCTIONS) ×6 IMPLANT
KIT VASCULAR DILATOR ×3 IMPLANT
LEAD PACING MYOCARDI (MISCELLANEOUS) ×3 IMPLANT
LINE VENT (MISCELLANEOUS) ×3 IMPLANT
MARKER GRAFT CORONARY BYPASS (MISCELLANEOUS) IMPLANT
NEEDLE AORTIC ROOT 14G 7F (CATHETERS) ×3 IMPLANT
NS IRRIG 1000ML POUR BTL (IV SOLUTION) ×27 IMPLANT
PACK OPEN HEART (CUSTOM PROCEDURE TRAY) ×3 IMPLANT
PAD ARMBOARD 7.5X6 YLW CONV (MISCELLANEOUS) ×3 IMPLANT
PAD ELECT DEFIB RADIOL ZOLL (MISCELLANEOUS) ×3 IMPLANT
PAIN PUMP ON-Q 400MLX5ML 5IN (MISCELLANEOUS) IMPLANT
PROBE CRYO2-ABLATION MALLABLE (MISCELLANEOUS) ×3 IMPLANT
RETRACTOR TRL SOFT TISSUE LG (INSTRUMENTS) IMPLANT
RETRACTOR TRM SOFT TISSUE 7.5 (INSTRUMENTS) IMPLANT
SARNS SOFTFLOW EXTENDED AORTIC CANNULA ×3 IMPLANT
SET CANNULATION TOURNIQUET (MISCELLANEOUS) ×3 IMPLANT
SET CARDIOPLEGIA MPS 5001102 (MISCELLANEOUS) ×3 IMPLANT
SET IRRIG TUBING LAPAROSCOPIC (IRRIGATION / IRRIGATOR) ×6 IMPLANT
SOLUTION ANTI FOG 6CC (MISCELLANEOUS) ×6 IMPLANT
SPONGE GAUZE 4X4 12PLY (GAUZE/BANDAGES/DRESSINGS) ×6 IMPLANT
SPONGE GAUZE 4X4 STERILE 39 (GAUZE/BANDAGES/DRESSINGS) ×3 IMPLANT
SPONGE LAP 18X18 X RAY DECT (DISPOSABLE) ×9 IMPLANT
SPONGE LAP 4X18 X RAY DECT (DISPOSABLE) ×6 IMPLANT
SUCKER INTRACARDIAC WEIGHTED (SUCKER) ×6 IMPLANT
SUCKER WEIGHTED FLEX (MISCELLANEOUS) ×6 IMPLANT
SUT BONE WAX W31G (SUTURE) ×3 IMPLANT
SUT ETHIBOND (SUTURE) ×6 IMPLANT
SUT ETHIBOND 2 0 SH (SUTURE) ×14 IMPLANT
SUT ETHIBOND 2 0 SH 36X2 (SUTURE) ×10 IMPLANT
SUT ETHIBOND 2 0 V4 (SUTURE) IMPLANT
SUT ETHIBOND 2 0V4 GREEN (SUTURE) IMPLANT
SUT ETHIBOND 4 0 TF (SUTURE) IMPLANT
SUT ETHIBOND 5 0 C 1 30 (SUTURE) ×3 IMPLANT
SUT ETHIBOND NAB MH 2-0 36IN (SUTURE) ×3 IMPLANT
SUT ETHIBOND X763 2 0 SH 1 (SUTURE) ×3 IMPLANT
SUT GORETEX 6.0 TH-9 30 IN (SUTURE) IMPLANT
SUT GORETEX CV 4 TH 22 36 (SUTURE) ×3 IMPLANT
SUT GORETEX CV-5THC-13 36IN (SUTURE) IMPLANT
SUT GORETEX CV4 TH-18 (SUTURE) ×6 IMPLANT
SUT GORETEX TH-18 36 INCH (SUTURE) IMPLANT
SUT MITRAL W/O PLEDGETS V69420 (SUTURE) IMPLANT
SUT MNCRL AB 3-0 PS2 18 (SUTURE) ×6 IMPLANT
SUT PDS AB 1 CTX 36 (SUTURE) ×3 IMPLANT
SUT PROLENE 3 0 SH 1 (SUTURE) ×6 IMPLANT
SUT PROLENE 3 0 SH DA (SUTURE) ×18 IMPLANT
SUT PROLENE 3 0 SH1 36 (SUTURE) ×12 IMPLANT
SUT PROLENE 4 0 RB 1 (SUTURE) ×5
SUT PROLENE 4 0 SH DA (SUTURE) ×12 IMPLANT
SUT PROLENE 4-0 RB1 .5 CRCL 36 (SUTURE) ×10 IMPLANT
SUT PROLENE 5 0 C 1 36 (SUTURE) ×6 IMPLANT
SUT PROLENE 6 0 C 1 30 (SUTURE) ×15 IMPLANT
SUT PROLENE 7 0 BV 1 (SUTURE) IMPLANT
SUT SILK  1 MH (SUTURE) ×9
SUT SILK 1 MH (SUTURE) ×18 IMPLANT
SUT SILK 1 TIES 10X30 (SUTURE) ×3 IMPLANT
SUT SILK 2 0 SH CR/8 (SUTURE) ×3 IMPLANT
SUT SILK 2 0 TIES 10X30 (SUTURE) ×3 IMPLANT
SUT SILK 2 0SH CR/8 30 (SUTURE) ×6 IMPLANT
SUT SILK 3 0 (SUTURE) ×1
SUT SILK 3 0 SH CR/8 (SUTURE) ×3 IMPLANT
SUT SILK 3 0SH CR/8 30 (SUTURE) ×3 IMPLANT
SUT SILK 3-0 18XBRD TIE 12 (SUTURE) ×2 IMPLANT
SUT STEEL 6MS V (SUTURE) IMPLANT
SUT STEEL STERNAL CCS#1 18IN (SUTURE) ×3 IMPLANT
SUT STEEL SZ 6 DBL 3X14 BALL (SUTURE) ×6 IMPLANT
SUT TEM PAC WIRE 2 0 SH (SUTURE) ×6 IMPLANT
SUT VIC AB 2-0 CTX 27 (SUTURE) IMPLANT
SUT VIC AB 2-0 CTX 36 (SUTURE) ×6 IMPLANT
SUT VIC AB 2-0 UR6 27 (SUTURE) ×6 IMPLANT
SUT VIC AB 3-0 SH 8-18 (SUTURE) ×9 IMPLANT
SUT VICRYL 2 TP 1 (SUTURE) ×3 IMPLANT
SYRINGE 10CC LL (SYRINGE) ×3 IMPLANT
SYSTEM ANNULOPLASTY MC3 (Orthopedic Implant) ×3 IMPLANT
SYSTEM SAHARA CHEST DRAIN ATS (WOUND CARE) ×6 IMPLANT
TAPE CLOTH SURG 4X10 WHT LF (GAUZE/BANDAGES/DRESSINGS) ×3 IMPLANT
TOWEL OR 17X24 6PK STRL BLUE (TOWEL DISPOSABLE) ×6 IMPLANT
TOWEL OR 17X26 10 PK STRL BLUE (TOWEL DISPOSABLE) ×6 IMPLANT
TRAY FOLEY IC TEMP SENS 14FR (CATHETERS) ×3 IMPLANT
TROCAR XCEL BLADELESS 5X75MML (TROCAR) ×3 IMPLANT
TROCAR XCEL NON-BLD 11X100MML (ENDOMECHANICALS) ×6 IMPLANT
TUBE SUCT INTRACARD DLP 20F (MISCELLANEOUS) ×3 IMPLANT
TUBING INSUFFLATION 10FT LAP (TUBING) ×6 IMPLANT
TUNNELER SHEATH ON-Q 11GX8 (MISCELLANEOUS) IMPLANT
UNDERPAD 30X30 INCONTINENT (UNDERPADS AND DIAPERS) ×6 IMPLANT
WATER STERILE IRR 1000ML POUR (IV SOLUTION) ×12 IMPLANT
WIRE BENTSON .035X145CM (WIRE) ×3 IMPLANT

## 2011-09-24 NOTE — Op Note (Signed)
CARDIOTHORACIC SURGERY OPERATIVE NOTE  Date of Procedure: 09/24/2011  Preoperative Diagnosis:   Moderate Mitral Regurgitation and Moderate Mitral Stenosis  Recurrent Persistent Atrial Fibrillation  Postoperative Diagnosis: Same  Procedure:   Mitral Valve Replacement (#27 mm Medtronic Mosaic Porcine Bioprosthetic Tissue Valve)  Tricuspid Valve Repair (#26 mm Edwards mc3 ring annuloplasty)  Maze Procedure (complete biatrial lesion set with clipping of left atrial appendage)    Surgeon: Salvatore Decent. 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  She has had worsening symptoms of congestive heart failure, recurrent paroxysmal and persistent atrial fibrillation, and a previous stroke.  She presents for elective surgical intervention.  A full note has been outlined previously and the patient provides full informed consent for the procedure as described.   DETAILS OF THE OPERATIVE PROCEDURE  The patient is brought to the operating room on the above mentioned date and central monitoring was established by the anesthesia team including placement of Swan-Ganz catheter and radial arterial line. The patient is placed in the supine position on the operating table.  Intravenous antibiotics are administered. General endotracheal anesthesia is induced under the care and direction of Dr. Michelle Piper. Initially plans were  attempted for intubation with a dual-lumen endotracheal tube. However, this was unsuccessful despite numerous attempts because of what was felt to be difficult anatomy. Moreover, the patient had a tendency to desaturate with intermittent periods of hypoxemia despite relatively brief apneic spells during the attempted intubation. Plans for dual-lumen endotracheal tube placement and minimally invasive surgery were subsequently canceled. A Foley catheter is placed.  Baseline transesophageal echocardiogram was performed.  Findings were notable for rheumatic mitral valve disease with moderate mitral stenosis and moderate mitral regurgitation. There was severe thickening of both the anterior and posterior leaflets of the mitral valve with severe thickening and foreshortening of the subvalvular apparatus. There was type IIIA dysfunction with restriction of both the anterior and posterior leaflets during both systole and diastole. There was normal left ventricular systolic function. There was normal left ventricular size. The left atrium was mildly dilated. There was moderate tricuspid regurgitation. The tricuspid regurgitation was purely functional (type I). There was trivial aortic insufficiency. No other abnormalities were noted.  The patient's chest, abdomen, both groins, and both lower extremities are prepared and draped in a sterile manner. A time out procedure is performed.  A median sternotomy incision was performed and the pericardium is opened. The ascending aorta is normal in appearance. The right common femoral vein is cannulated using the Seldinger technique and a guidewire advanced into the right atrium using TEE guidance.  The patient is heparinized systemically and the femoral vein cannulated using a 22 Fr long femoral venous cannula.  The ascending aorta is cannulated for cardiopulmonary bypass.  Adequate heparinization is verified.   A retrograde cardioplegia cannula is placed through the right  atrium into the coronary sinus.  The entire pre-bypass portion of the operation was notable for fairly stable hemodynamics, although the patient had severe pulmonary hypertension which intermittently would go up  and down significantly.  Cardiopulmonary bypass was begun and the surface of the heart is inspected.  A second venous cannula is placed directly into the superior vena cava.   A cardioplegia cannula is placed in the ascending aorta.  A temperature probe was placed in the interventricular septum.  Tapes were placed around the superior and inferior vena cava.  The Atricure Frigitronics cryothermy system is utilized for all cryothermy ablation lesions for the maze procedure.  The right atrial lateral wall lesions are placed including a longitudinal line along the lateral wall from the superior vena cava to the inferior vena cava.  A second lesion is then placed extending from the midportion of the first lesion in an anterior and inferior direction to reach the acute margin of the heart.  The patient is cooled to 30C systemic temperature.  The aortic cross clamp is applied and cold blood cardioplegia is delivered initially in an antegrade fashion through the aortic root.   Supplemental cardioplegia is given retrograde through the coronary sinus catheter.  Iced saline slush is applied for topical hypothermia.  The initial cardioplegic arrest is rapid with early diastolic arrest.  Repeat doses of cardioplegia are administered intermittently throughout the entire cross clamp portion of the operation through the aortic root and through the coronary sinus catheter in order to maintain completely flat electrocardiogram and septal myocardial temperature below 15C.  Myocardial protection was felt to be  excellent.  The Atricure bipolar radiofrequency ablation clamp is used for all radiofrequency ablation lesions for the maze procedure.  The heart is retracted towards the surgeon's side and the left sided  pulmonary veins exposed.  An elliptical ablation lesion is created around the base of the left sided pulmonary veins.  A similar elliptical lesion was created around the base of the left atrial appendage.  The left atrial appendage was obliterated by deploying a 40 mm Atricure left atrial appendage clip across the appendage.  The heart was replaced into the pericardial sac.  A left atriotomy incision was performed through the interatrial groove and extended partially across the back wall of the left atrium after opening the oblique sinus inferiorly.  The floor of the left atrium and the mitral valve were exposed using a self-retaining retractor.  The mitral valve was inspected and notable for classical rheumatic disease with severe thickening and fibrosis of both the anterior and posterior leaflets and a fishmouth appearance of the mitral orifice. There was moderate mitral stenosis. There was severe calcification in the leaflets and in the subvalvular apparatus. An attempt at mitral valve repair is felt to be impossible. Mitral valve replacement is planned .    An ablation lesion was placed around the right sided pulmonary veins using the bipolar clamp with one limb of the clamp along the endocardial surface and one along the epicardial surface posteriorly.  A bipolar ablation lesion was placed across the dome of the left atrium from the cephalad apex of the atriotomy incision to reach the cephalad apex of the elliptical lesion around the left sided pulmonary veins.  A similar bipolar lesion was placed across the back wall of the left atrium from the caudad apex of the atriotomy incision to reach the caudad apex of the elliptical lesion around the left sided pulmonary veins, thereby completing a box.  Finally another bipolar lesion was placed across the back wall of the left atrium from the caudad apex of the atriotomy incision towards the posterior mitral valve annulus.  This lesion was completed  along the  endocardial surface onto the posterior mitral annulus with a 3 minute duration cryothermy lesion, followed by a second cryothermy lesion along the posterior epicardial surface of the left atrium to the coronary sinus.  This completes the entire left side lesion set of the Cox maze procedure.  The anterior leaflet of the mitral valve was excised sharply, and the posterior leaflet split in the midline.  The mitral annulus was sized to accept a 27 mm prosthesis.  The left ventricle was irrigated with copious cold saline solution.  Mitral valve replacement was performed using interrupted horizontal mattress 2-0 Ethibond pledgeted sutures with pledgets in the supraannular position.  The posterior leaflet was incorporated into the suture line posteriorly, thereby preserving chords to the posterior leaflet.  A Medtronic Mosaic porcine bioprosthetic tissue valve (size #27 mm, serial # [id number]) was implanted uneventfully. The valve seated appropriately with care to position the commissure posts away from the left ventricular outflow tract.  The atriotomy was closed using a 2-layer closure of running 3-0 Prolene suture after placing a sump drain across the mitral valve to serve as a left ventricular vent.  A right atriotomy incision is performed in the oblique orientation along the lateral wall of the right atrium. The tricuspid valve was inspected and normal in appearance. The remaining lesions of the right side Cox maze procedure are completed with cryothermy.  A lesion is made at the 2:00 position on the endocardial surface from the acute margin of the heart to reach the tricuspid annulus. A lesion is then made at the 10:00 position from the tricuspid annulus up to the apex of the right atrial appendage. This completes the entire right side lesion set of the Cox maze procedure.  One final dose of warm retrograde "hot shot" cardioplegia was administered retrograde through the coronary sinus catheter while all  air was evacuated through the aortic root.  The aortic cross clamp was removed after a total cross clamp time of 114 minutes.  Ring annuloplasty is performed on the tricuspid valve using interrupted 2-0 Ethibond horizontal mattress sutures placed circumferentially around the tricuspid annulus with exception of the region immediately beneath the triangle of coke. The tricuspid valve was sized to a 26 mm annuloplasty ring. An Edwards [employer] mc3 annuloplasty ring (size #74mm, model #4900, serial [id number]) was secured uneventfully. After completion of ring annuloplasty the valve was tested with saline and appears to be competent. The right atriotomy incision is closed with a 2 layer closure of running 4-0 Prolene suture.  Epicardial pacing wires are fixed to the right ventricular outflow tract and to the right atrial appendage. The patient is rewarmed to 37C temperature. The aortic and left ventricular vents are removed.  The patient is weaned and disconnected from cardiopulmonary bypass.  The patient's rhythm at separation from bypass was AV paced.  The patient was weaned from cardioplegic bypass on low dose milrinone and dopamine for inotropic support. Total cardiopulmonary bypass time for the operation was 186 minutes.  Followup transesophageal echocardiogram performed after separation from bypass revealed a well-seated mitral valve prosthesis that was functioning normally and without any sign of perivalvular leak.  Left ventricular function was unchanged from preoperatively.  There was a well-seated annuloplasty ring and the tricuspid position with trivial residual tricuspid regurgitation. No other significant abnormalities were noted.  The aortic and superior vena cava cannula were removed uneventfully. Protamine was administered to reverse the anticoagulation. The femoral venous cannula was removed and manual pressure held on the groin  for 30 minutes.  The mediastinum and pleural space were  inspected for hemostasis and irrigated with saline solution.  There was significant coagulopathy which was treated with transfusion of platelets and fresh frozen plasma. Coagulopathy appeared to resolve. The mediastinum and both pleural spaces were drained using 4 chest tubes placed through separate stab incisions inferiorly.  The soft tissues anterior to the aorta were reapproximated loosely. The sternum is closed with double strength sternal wire. The soft tissues anterior to the sternum were closed in multiple layers and the skin is closed with a running subcuticular skin closure.  The patient tolerated the procedure well and is transported to the surgical intensive care in stable condition. There are no intraoperative complications. All sponge instrument and needle counts are verified correct at completion of the operation.  The post-bypass portion of the operation was notable for stable rhythm and hemodynamics although the patient required admission of norepinephrine infusion due to vasodilatation refractory to Neo-Synephrine.  The patient received a total of 3 units packed red blood cells, 2 units fresh frozen plasma, and 2 packs of a dull platelets during the operation.   Salvatore Decent. Cornelius Moras MD

## 2011-09-24 NOTE — Interval H&P Note (Signed)
History and Physical Interval Note:   09/24/2011   7:31 AM   [name]  has presented today for surgery, with the diagnosis of Mitral Regurgitation, Triscupid Regurgitation, Atrial Fib  The various methods of treatment have been discussed with the patient and family. After consideration of risks, benefits and other options for treatment, the patient has consented to  Procedure(s): MINIMALLY INVASIVE MITRAL VALVE (MV) REPLACEMENT TRICUSPID VALVE REPLACEMENT MAZE as a surgical intervention .  The patients' history has been reviewed, patient examined, no change in status, stable for surgery.  I have reviewed the patients' chart and labs.  Questions were answered to the patient's satisfaction.     Purcell Nails  MD  The patient returns for elective surgery today.  There have been no interval changes to their history as previously documented.  I have seen and briefly examined the patient in the preoperative holding area, and there are no changes to report in comparison with their previously documented exam.  All labs and diagnostic tests have been reviewed.  We will proceed with surgery as planned.

## 2011-09-24 NOTE — Progress Notes (Signed)
  Echocardiogram Echocardiogram Transesophageal has been performed.  [name] 09/24/2011, 12:31 PM

## 2011-09-24 NOTE — Preoperative (Signed)
Beta Blockers   Reason not to administer Beta Blockers:Not Applicable 

## 2011-09-24 NOTE — Anesthesia Postprocedure Evaluation (Signed)
  Anesthesia Post-op Note  Patient: [name]  Procedure(s) Performed:  TRICUSPID VALVE REPLACEMENT - Repair; MAZE; MITRAL VALVE (MV) REPLACEMENT  Patient Location: SICU  Anesthesia Type: General  Level of Consciousness: unresponsive  Airway and Oxygen Therapy: Patient remains intubated per anesthesia plan  Post-op Pain: none  Post-op Assessment: Post-op Vital signs reviewed  Post-op Vital Signs: stable  Complications: No apparent anesthesia complications

## 2011-09-24 NOTE — OR Nursing (Signed)
First call to SICU was made at 1425

## 2011-09-24 NOTE — Anesthesia Postprocedure Evaluation (Signed)
  Anesthesia Post-op Note  Patient: [name]  Procedure(s) Performed:  TRICUSPID VALVE REPLACEMENT - Repair; MAZE; MITRAL VALVE (MV) REPLACEMENT  Patient Location: PACU, ICU and SICU  Anesthesia Type: General  Level of Consciousness: sedated  Airway and Oxygen Therapy: Patient remains intubated per anesthesia plan  Post-op Pain: none  Post-op Assessment: Post-op Vital signs reviewed  Post-op Vital Signs: stable  Complications: No apparent anesthesia complications

## 2011-09-24 NOTE — Brief Op Note (Signed)
09/24/2011  1:22 PM  PATIENT:  [name]  70 y.o. female  PRE-OPERATIVE DIAGNOSIS:  Mitral Stenosis and Mitral Regurgitation, Triscupid Regurgitation, Atrial Fibrillation  POST-OPERATIVE DIAGNOSIS:  Mitral Stenosis and Mitral Regurgitation, Tricuspid Regurgitation, Atrial Fibrillation  PROCEDURE:  Procedure(s): TRICUSPID VALVE Repair  with 26 memo 3D ring annuloplasty MAZE (CRYO AND RF ) right and left full set with clipping of left atrial appendage MITRAL VALVE (MV) REPLACEMENT 27 Medtronic Mosaic porcine bioprosthetic tissue valve  SURGEON:  Surgeon(s): Purcell Nails, MD  PHYSICIAN ASSISTANT: Gershon Crane PA-C    ANESTHESIA:   general  EBL:  See anesthesia and perfusion records  BLOOD ADMINISTERED:see anesthesia and perfusion records   SPECIMEN:  Mitral valve leaflets  DISPOSITION OF SPECIMEN:  PATHOLOGY  COUNTS:  YES  TOURNIQUET:  * No tourniquets in log *  DICTATION: .Dragon Dictation  PLAN OF CARE: Admit to inpatient   PATIENT DISPOSITION:  To [facility] stable   Delay start of Pharmacological VTE agent (>24hrs) due to surgical blood loss or risk of bleeding:  yes

## 2011-09-24 NOTE — H&P (View-Only) (Signed)
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 In 2010 she was evaluated and underwent cardiac catheterization revealing normal coronary artery anatomy. She was found to have pulmonary hypertension. She was sent for pulmonary medicine consultation and she has been followed since then by Dr. Delton Coombes. She is on home oxygen therapy. She reports that over the last several years she has had continued progression of symptoms of exertional shortness of breath. She has had occasional tachypalpitations. In December 2011 she was hospitalized in [place], [place] with a mini stroke or transient neurologic deficit. She was started on Plavix at that time. In August of 2012 she was again hospitalized in [place] with shortness of breath and tachypalpitations. She was told that she was having episodes of irregular heart rhythms and she was offered possible invasive ablation therapy. The patient declined and stated that she wanted to followup with her primary cardiologist. She was seen in followup and transthoracic echocardiogram was performed 07/18/2011. This revealed mitral stenosis and mitral regurgitation with normal left ventricular systolic function. There was severe tricuspid regurgitation. The patient subsequently underwent transesophageal echocardiogram on 08/02/2011. 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 There was normal left ventricular size and function. There was severe tricuspid regurgitation. Left and right heart catheterization was also performed 08/02/2011. This confirmed the presence of moderate to severe mitral stenosis. There was pulmonary hypertension. There was normal coronary  artery anatomy with no significant coronary artery disease. The patient has been referred to consider mitral valve replacement and tricuspid valve repair with or without a Maze procedure.  Past Medical History   Diagnosis  Date   .  Pulmonary hypertension         s/p RHC 4/10 - PAP 86/29, wedge 21-23   .  Squamous cell carcinoma         Left arm   .  Gastric ulcer with hemorrhage     .  Rheumatic fever         Childhood   .  Breast cancer         2008 s/p chemo and radiation (last tx 4/09) s/p left mastectomy s/p 9 lymph node resection - [name], [facility]   .  Coronary artery disease         Nonobstructive, LVEF 55%   .  Mitral regurgitation         Moderate   .  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  Stroke         12/11 [place]   .  Mitral stenosis with insufficiency, rheumatic         Past Surgical History   Procedure  Date   .  Left mastectomy     .  Partial gastrectomy         Family History   Problem  Relation  Age of Onset   .  Heart disease  Mother     .  Colon cancer  Mother       Social History History   Substance Use Topics   .  Smoking status:  Never Smoker    .  Smokeless tobacco:  Never Used   .  Alcohol Use:  No  Current Outpatient Prescriptions   Medication  Sig  Dispense  Refill   .  amitriptyline (ELAVIL) 75 MG tablet  2 tabs every evening           .  clopidogrel (PLAVIX) 75 MG tablet  Take 75 mg by mouth daily.           .  cyanocobalamin (,VITAMIN B-12,) 1000 MCG/ML injection  Inject 1,000 mcg into the muscle every 30 (thirty) days.           .  diphenhydrAMINE (BENADRYL) 25 MG tablet  Take 25 mg by mouth at bedtime.           .  folic acid (FOLVITE) 1 MG tablet  Take 2 tabs every morning         .  furosemide (LASIX) 40 MG tablet  Take 40 mg by mouth daily.           .  hydrocodone-acetaminophen (LORCET PLUS) 7.5-650 MG per tablet  May take up to three times daily as needed         .  letrozole (FEMARA) 2.5  MG tablet  Take 2.5 mg by mouth daily.           Marland Kitchen  levothyroxine (SYNTHROID, LEVOTHROID) 137 MCG tablet  Take 137 mcg by mouth daily.           .  metFORMIN (GLUCOPHAGE) 500 MG tablet  Take one tab every morning & 2 tabs with supper         .  metoprolol (LOPRESSOR) 50 MG tablet  Take 1 tablet by mouth Twice daily.         .  Multiple Vitamins-Minerals (CENTRUM SILVER PO)  Take 1 tablet by mouth daily.           .  nitroGLYCERIN (NITROSTAT) 0.4 MG SL tablet  Place 0.4 mg under the tongue every 5 (five) minutes as needed.           .  pantoprazole (PROTONIX) 40 MG tablet  Take 40 mg by mouth daily.           .  pirbuterol (MAXAIR) 200 MCG/INH inhaler  Inhale 2 puffs into the lungs 4 (four) times daily.           .  potassium chloride (KLOR-CON) 10 MEQ CR tablet  Take 10 mEq by mouth. 2 tabs am 1 tab pm         .  rosuvastatin (CRESTOR) 10 MG tablet  Take 10 mg by mouth daily.           .  valsartan (DIOVAN) 160 MG tablet  Take 160 mg by mouth daily.           Marland Kitchen  zolpidem (AMBIEN) 10 MG tablet  Take 10 mg by mouth at bedtime as needed.               Allergies   Allergen  Reactions   .  Butorphanol Tartrate         REACTION: migraines, nervous/agitated   .  Lipitor (Atorvastatin Calcium)  Diarrhea       nightmares   .  Revatio (Sildenafil Citrate)  Swelling     Review of Systems  Constitutional: Positive for activity change, fatigue and unexpected weight change.       The patient complains of worsening fatigue. She has had decreased activity. Associated with this she has gained 10-15 pounds over the last 2 months.  HENT: Positive  for congestion and postnasal drip.         She has not seen a dentist in several years and she has a broken tooth that she feels probably needs to be extracted.  Eyes: Negative.   Respiratory: Positive for shortness of breath and wheezing.   Cardiovascular: Positive for palpitations and leg swelling. Negative for chest pain.        The patient describes a 2-3  year history of progressive symptoms of exertional shortness of breath. This has gotten particularly bad over the last several months such that now she gets short of breath with minimal physical activity. She denies orthopnea. She denies resting shortness of breath. She has not had exertional chest pain. She has had some occasional tachypalpitations without syncope. She has had mild lower extremity edema without significant abdominal bloating.  Gastrointestinal: Negative.   Genitourinary: Negative.   Musculoskeletal: Positive for back pain and arthralgias.       Diffuse chronic arthralgias and myalgias particularly afflicting her neck shoulders back in her right knee. However, she ambulates without assistance and her primary limitation is shortness of breath.  Neurological: Negative.   Hematological: Negative.   Psychiatric/Behavioral: Negative.     BP 121/63  Pulse 86  Resp 20  Ht 5\' 2"  (1.575 m)  Wt 165 lb (74.844 kg)  BMI 30.18 kg/m2  SpO2 98% Physical Exam  Vitals reviewed. Constitutional: She is oriented to person, place, and time. She appears well-developed.  HENT:   Head: Normocephalic.  Eyes: Pupils are equal, round, and reactive to light.  Neck: Normal range of motion. No JVD present.  Cardiovascular: Normal rate and regular rhythm.    Murmur heard.      There is a soft systolic murmur heard best along the left sternal border  Abdominal: Soft. Bowel sounds are normal. She exhibits no distension and no mass.  Musculoskeletal: Normal range of motion. She exhibits no edema.  Lymphadenopathy:    She has no cervical adenopathy.  Neurological: She is alert and oriented to person, place, and time.  Skin: Skin is warm and dry.  Psychiatric: She has a normal mood and affect. Her behavior is normal. Judgment and thought content normal.    Diagnostic Tests:  Transesophageal echocardiogram performed 08/02/2011 is reviewed. This reveals findings consistent with rheumatic mitral  valve disease. There is at least moderate mitral stenosis with valve area estimated 1.8 centimeters squared. There is at least moderate mitral regurgitation. The leaflets of the mitral valve are severely restricted during both systole and diastole. This corresponds to type IIIA mitral valve dysfunction as would be expected for rheumatic disease. The degree of mitral regurgitation is bad enough that balloon valvuloplasty probably would be unwise. Left ventricular size and function appears normal. There is severe left atrial enlargement. There is trace aortic insufficiency. There is severe tricuspid regurgitation. There is moderate right ventricular chamber enlargement. The dysfunction of the tricuspid valve appears to be purely functional, type I dysfunction, although the leaflets of the valve are not well visualized.  Left and right heart catheterization performed 08/02/2011 is reviewed. This demonstrates normal coronary artery anatomy with no significant coronary artery disease. There is left dominant coronary circulation. Left ventricular systolic function appears normal. There is severe pulmonary hypertension with PA pressures measured 75/26 and mean pulmonary artery pressure 46. Pulmonary vascular resistance measured 4.3 Woods units. Pulmonary A wedge pressure was 20 but there were large V waves. Mean gradient across the mitral valve measured 14.4 mm mercury corresponding to calculated  valve area 1.3 cm. 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 In 2010 she was evaluated and underwent cardiac catheterization revealing normal coronary artery anatomy. She was found to have pulmonary hypertension. She was sent for pulmonary medicine consultation and she has been followed since then by Dr.  Delton Coombes. She is on home oxygen therapy. She reports that over the last several years she has had continued progression of symptoms of exertional shortness of breath. She has had occasional tachypalpitations. In December 2011 she was hospitalized in [place], [place] with a mini stroke or transient neurologic deficit. She was started on Plavix at that time. In August of 2012 she was again hospitalized in [place] with shortness of breath and tachypalpitations. She was told that she was having episodes of irregular heart rhythms and she was offered possible invasive ablation therapy. The patient declined and stated that she wanted to followup with her primary cardiologist. She was seen in followup and transthoracic echocardiogram was performed 07/18/2011. This revealed mitral stenosis and mitral regurgitation with normal left ventricular systolic function. There was severe tricuspid regurgitation. The patient subsequently underwent transesophageal echocardiogram on 08/02/2011. 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 There was normal left ventricular size and function. There was severe tricuspid regurgitation. Left and right heart catheterization was also performed 08/02/2011. This confirmed the presence of moderate to severe mitral stenosis. There was pulmonary hypertension. There was normal coronary artery anatomy with no significant coronary artery disease. The patient has been referred to consider mitral valve replacement and tricuspid valve repair with or without a Maze procedure.  Pulmonary function tests performed 08/30/2011 are reviewed. Baseline FEV1 measured 1.43 L or 64% predicted. This increased slightly with bronchodilator therapy. The FEF 25-75 was 1.25 L or 65% predicted. This did not improve with bronchodilator therapy. There was suggestion of hyperinflation with elevated residual volume.  Diffusion capacity was moderately decreased.  CT angiogram of the chest abdomen and pelvis performed 08/30/2011 is reviewed. This reveals minimal atherosclerotic disease involving the thoracic and abdominal aorta and iliac vessels. The patient has changes consistent with previous partial gastrectomy. There is cardiomegaly. Chest CT scan is otherwise unchanged in comparison with previous CT scans and in particular there is no sign of any significant intrinsic lung disease. There is stable mediastinal lymphadenopathy that his chronic, unchanged, and undoubtedly reactive in nature.   Impression:  The patient has at least moderate mitral stenosis and moderate mitral regurgitation with severe secondary pulmonary hypertension related to congestive heart failure. The patient has secondary severe tricuspid regurgitation. Over the last few years the patient has had progressive worsening of symptoms of shortness of breath to the point where she now get short of breath with minimal physical activity. She was also hospitalized in August with episodes of transient supraventricular tachycardia. I agree that she would best be treated with mitral valve replacement and tricuspid valve repair. I think it would be reasonable to consider concomitant Maze procedure. Risks of surgery will be somewhat elevated because of the long-standing chronicity of this problem, the degree of pulmonary hypertension, and her limited functional status. The patient does not have a history of tobacco use and I doubt that she has significant underlying chronic obstructive pulmonary disease. However, her long-standing rheumatic mitral valve disease  certainly might have resulted in the reversible pulmonary hypertension. However, at this point I would be hopeful that she would likely tolerate surgery and hopefully enjoyed considerable symptomatic improvement postoperatively  Plan:  We plan to proceed with mitral valve replacement using a  bioprosthetic tissue valve. We will perform Maze procedure and possible tricuspid valve repair at the time of surgery. This patient specifically requests that we approach her case using the right mini thoracotomy approach. I have counseled the patient at length regarding the indications, risks, and potential benefits of surgery. The patient and her daughter both understand the somewhat high-risk of surgery given her limited functional status, long-standing pulmonary hypertension, and other chronic medical illnesses. They understand the potential for a prolonged convalescence even in the absence of any postoperative complications. They understand and accept all potential associated risks of surgery including but not limited to risk of death, stroke, myocardial infarction, congestive heart failure, respiratory failure, renal failure, pneumonia, bleeding requiring blood transfusion, heart block or bradycardia or requiring permanent pacemaker, arrhythmia, infection, late complications related to valve repair or replacement and in particular the potential for late structural valve deterioration in failure do to replacement of her mitral valve using a bioprosthetic tissue valve. All of her questions been addressed. We tentatively plan to proceed with surgery on Tuesday, November 13.

## 2011-09-24 NOTE — Progress Notes (Signed)
[facility] BRIEF SICU PROGRESS NOTE  Day of Surgery  S/P Procedure(s) (LRB): TRICUSPID VALVE REPLACEMENT (N/A) MAZE (N/A) MITRAL VALVE (MV) REPLACEMENT (N/A)   Stable postop  Assessment/Plan:  Continue routine postop.  Wean drips and vent as tolerated.     [name] H

## 2011-09-24 NOTE — Transfer of Care (Signed)
Immediate Anesthesia Transfer of Care Note  Patient: [name]  Procedure(s) Performed:  TRICUSPID VALVE REPLACEMENT - Repair; MAZE; MITRAL VALVE (MV) REPLACEMENT  Patient Location: SICU  Anesthesia Type: General  Level of Consciousness: sedated, unresponsive and Patient remains intubated per anesthesia plan  Airway & Oxygen Therapy: Patient re-intubated, Patient remains intubated per anesthesia plan and Patient placed on Ventilator (see vital sign flow sheet for setting)  Post-op Assessment: Report given to PACU RN and Post -op Vital signs reviewed and stable  Post vital signs: Reviewed and stable  Complications: No apparent anesthesia complications

## 2011-09-24 NOTE — Anesthesia Preprocedure Evaluation (Addendum)
Anesthesia Evaluation  Patient identified by MRN, date of birth, ID band Patient awake    Reviewed: Allergy & Precautions, H&P , NPO status , Patient's Chart, lab work & pertinent test results, reviewed documented beta blocker date and time   Airway Mallampati: II TM Distance: >3 FB Neck ROM: Limited    Dental  (+) Dental Advisory Given, Missing, Implants and Caps,    Pulmonary shortness of breath, with exertion and Long-Term Oxygen Therapy, asthma (inhalers) , pneumonia ,    + decreased breath sounds      Cardiovascular Exercise Tolerance: Poor hypertension, Pt. on medications and Pt. on home beta blockers + CAD and +CHF + Valvular Problems/Murmurs MR Regular Normal+ Systolic murmurs    Neuro/Psych  Neuromuscular disease CVA, No Residual Symptoms    GI/Hepatic PUD, GERD-  Medicated and Controlled,(+) Hepatitis - (pt reports when 70yrs old. no residual issues)  Endo/Other  Diabetes mellitus-, Well Controlled, Type 2, Oral Hypoglycemic AgentsHypothyroidism   Renal/GU      Musculoskeletal  (+) Fibromyalgia -  Abdominal   Peds  Hematology   Anesthesia Other Findings   Reproductive/Obstetrics                         Anesthesia Physical Anesthesia Plan  ASA: III  Anesthesia Plan: General   Post-op Pain Management:    Induction: Intravenous  Airway Management Planned: Double Lumen EBT and Video Laryngoscope Planned  Additional Equipment: Arterial line, 3D TEE, PA Cath, Ultrasound Guidance Line Placement and CVP  Intra-op Plan:   Post-operative Plan: Post-operative intubation/ventilation  Informed Consent: I have reviewed the patients History and Physical, chart, labs and discussed the procedure including the risks, benefits and alternatives for the proposed anesthesia with the patient or authorized representative who has indicated his/her understanding and acceptance.   Dental advisory  given  Plan Discussed with: Anesthesiologist, CRNA and Surgeon  Anesthesia Plan Comments:        Anesthesia Quick Evaluation

## 2011-09-25 ENCOUNTER — Inpatient Hospital Stay (HOSPITAL_COMMUNITY): Payer: Medicare Other

## 2011-09-25 DIAGNOSIS — I059 Rheumatic mitral valve disease, unspecified: Secondary | ICD-10-CM

## 2011-09-25 LAB — GLUCOSE, CAPILLARY
Glucose-Capillary: 144 mg/dL — ABNORMAL HIGH (ref 70–99)
Glucose-Capillary: 218 mg/dL — ABNORMAL HIGH (ref 70–99)
Glucose-Capillary: 216 mg/dL — ABNORMAL HIGH (ref 70–99)
Glucose-Capillary: 170 mg/dL — ABNORMAL HIGH (ref 70–99)
Glucose-Capillary: 144 mg/dL — ABNORMAL HIGH (ref 70–99)
Glucose-Capillary: 91 mg/dL (ref 70–99)
Glucose-Capillary: 144 mg/dL — ABNORMAL HIGH (ref 70–99)
Glucose-Capillary: 112 mg/dL — ABNORMAL HIGH (ref 70–99)
Glucose-Capillary: 155 mg/dL — ABNORMAL HIGH (ref 70–99)
Glucose-Capillary: 89 mg/dL (ref 70–99)
Glucose-Capillary: 89 mg/dL (ref 70–99)

## 2011-09-25 LAB — PREPARE FRESH FROZEN PLASMA: Unit division: 0

## 2011-09-25 LAB — POCT I-STAT 3, ART BLOOD GAS (G3+)
Acid-base deficit: 5 mmol/L — ABNORMAL HIGH (ref 0.0–2.0)
Bicarbonate: 22.5 mEq/L (ref 20.0–24.0)
Bicarbonate: 20.3 meq/L (ref 20.0–24.0)
O2 Saturation: 91 %
Patient temperature: 37.9
TCO2: 21 mmol/L (ref 0–100)
TCO2: 21 mmol/L (ref 0–100)
pCO2 arterial: 35.6 mmHg (ref 35.0–45.0)
pCO2 arterial: 42.2 mmHg (ref 35.0–45.0)
pH, Arterial: 7.276 — ABNORMAL LOW (ref 7.350–7.400)
pH, Arterial: 7.339 — ABNORMAL LOW (ref 7.350–7.400)
pH, Arterial: 7.364 (ref 7.350–7.400)
pO2, Arterial: 62 mmHg — ABNORMAL LOW (ref 80.0–100.0)
pO2, Arterial: 98 mmHg (ref 80.0–100.0)

## 2011-09-25 LAB — POCT I-STAT, CHEM 8
Creatinine, Ser: 1.5 mg/dL — ABNORMAL HIGH (ref 0.50–1.10)
Glucose, Bld: 98 mg/dL (ref 70–99)
HCT: 35 % — ABNORMAL LOW (ref 36.0–46.0)
Hemoglobin: 11.9 g/dL — ABNORMAL LOW (ref 12.0–15.0)
Potassium: 4.5 mEq/L (ref 3.5–5.1)
Sodium: 140 mEq/L (ref 135–145)
TCO2: 23 mmol/L (ref 0–100)

## 2011-09-25 LAB — CBC
HCT: 28.5 % — ABNORMAL LOW (ref 36.0–46.0)
MCHC: 33.7 g/dL (ref 30.0–36.0)
MCV: 84.8 fL (ref 78.0–100.0)
MCV: 85.2 fL (ref 78.0–100.0)
Platelets: 124 10*3/uL — ABNORMAL LOW (ref 150–400)
RDW: 15.2 % (ref 11.5–15.5)
RDW: 15.7 % — ABNORMAL HIGH (ref 11.5–15.5)
WBC: 23.6 10*3/uL — ABNORMAL HIGH (ref 4.0–10.5)

## 2011-09-25 LAB — PREPARE PLATELET PHERESIS: Unit division: 0

## 2011-09-25 LAB — CREATININE, SERUM
Creatinine, Ser: 1.39 mg/dL — ABNORMAL HIGH (ref 0.50–1.10)
GFR calc Af Amer: 44 mL/min — ABNORMAL LOW (ref >90)

## 2011-09-25 LAB — BASIC METABOLIC PANEL
BUN: 22 mg/dL (ref 6–23)
Creatinine, Ser: 1.02 mg/dL (ref 0.50–1.10)
GFR calc Af Amer: 64 mL/min — ABNORMAL LOW (ref >90)
GFR calc non Af Amer: 55 mL/min — ABNORMAL LOW (ref >90)

## 2011-09-25 LAB — MAGNESIUM: Magnesium: 2.3 mg/dL (ref 1.5–2.5)

## 2011-09-25 MED ORDER — INSULIN GLARGINE 100 UNIT/ML ~~LOC~~ SOLN
20.0000 [IU] | Freq: Every day | SUBCUTANEOUS | Status: DC
  Administered 2011-09-25: 20 [IU] via SUBCUTANEOUS
  Filled 2011-09-25: qty 3

## 2011-09-25 MED ORDER — AMITRIPTYLINE HCL 75 MG PO TABS
150.0000 mg | ORAL_TABLET | Freq: Every day | ORAL | Status: DC
  Administered 2011-09-25 – 2011-09-30 (×6): 150 mg via ORAL
  Filled 2011-09-25 (×7): qty 2

## 2011-09-25 MED ORDER — LEVOTHYROXINE SODIUM 137 MCG PO TABS
137.0000 ug | ORAL_TABLET | Freq: Every day | ORAL | Status: DC
  Administered 2011-09-26 – 2011-10-15 (×20): 137 ug via ORAL
  Filled 2011-09-25 (×22): qty 1

## 2011-09-25 MED ORDER — SODIUM BICARBONATE 8.4 % IV SOLN
50.0000 meq | Freq: Once | INTRAVENOUS | Status: AC
  Administered 2011-09-25: 50 meq via INTRAVENOUS

## 2011-09-25 MED ORDER — FOLIC ACID 1 MG PO TABS
2.0000 mg | ORAL_TABLET | Freq: Every day | ORAL | Status: DC
  Administered 2011-09-25 – 2011-10-15 (×21): 2 mg via ORAL
  Filled 2011-09-25 (×23): qty 2

## 2011-09-25 MED ORDER — CYANOCOBALAMIN 1000 MCG/ML IJ SOLN
1000.0000 ug | INTRAMUSCULAR | Status: DC
  Administered 2011-10-09: 1000 ug via INTRAMUSCULAR
  Filled 2011-09-25 (×3): qty 1

## 2011-09-25 MED ORDER — SODIUM CHLORIDE 0.9 % IV SOLN: INTRAVENOUS | Status: DC | PRN

## 2011-09-25 MED ORDER — AMITRIPTYLINE HCL 75 MG PO TABS: 75.0000 mg | ORAL_TABLET | Freq: Every day | ORAL | Status: DC

## 2011-09-25 MED ORDER — INSULIN ASPART 100 UNIT/ML ~~LOC~~ SOLN: 0.0000 [IU] | SUBCUTANEOUS | Status: DC

## 2011-09-25 MED ORDER — LETROZOLE 2.5 MG PO TABS
2.5000 mg | ORAL_TABLET | Freq: Every day | ORAL | Status: DC
  Administered 2011-09-26 – 2011-10-15 (×20): 2.5 mg via ORAL
  Filled 2011-09-25 (×22): qty 1

## 2011-09-25 MED ORDER — AMIODARONE HCL 200 MG PO TABS
200.0000 mg | ORAL_TABLET | Freq: Two times a day (BID) | ORAL | Status: DC
  Administered 2011-09-25 (×2): 200 mg via ORAL
  Filled 2011-09-25 (×5): qty 1

## 2011-09-25 MED ORDER — POTASSIUM CHLORIDE 10 MEQ/50ML IV SOLN
10.0000 meq | INTRAVENOUS | Status: AC
  Administered 2011-09-25 (×3): 10 meq via INTRAVENOUS

## 2011-09-25 MED FILL — Potassium Chloride Inj 2 mEq/ML: INTRAVENOUS | Qty: 40 | Status: AC

## 2011-09-25 MED FILL — Papaverine HCl Inj 30 MG/ML: INTRAMUSCULAR | Qty: 2 | Status: AC

## 2011-09-25 MED FILL — Aminocaproic Acid Inj 250 MG/ML: INTRAVENOUS | Qty: 40 | Status: AC

## 2011-09-25 MED FILL — Insulin Regular (Human) Inj 100 Unit/ML: INTRAMUSCULAR | Qty: 10 | Status: AC

## 2011-09-25 MED FILL — Vancomycin HCl For IV Soln 1 GM (Base Equivalent): INTRAVENOUS | Qty: 1250 | Status: AC

## 2011-09-25 MED FILL — Magnesium Sulfate Inj 50%: INTRAMUSCULAR | Qty: 2 | Status: AC

## 2011-09-25 MED FILL — Nitroglycerin IV Soln 200 MCG/ML in D5W: INTRAVENOUS | Qty: 250 | Status: AC

## 2011-09-25 MED FILL — Cefuroxime Sodium For Inj 750 MG: INTRAMUSCULAR | Qty: 750 | Status: AC

## 2011-09-25 MED FILL — Dopamine in Dextrose 5% Inj 3.2 MG/ML: INTRAVENOUS | Qty: 250 | Status: AC

## 2011-09-25 MED FILL — Heparin Sodium (Porcine) Inj 5000 Unit/ML: INTRAMUSCULAR | Qty: 1 | Status: AC

## 2011-09-25 MED FILL — Dexmedetomidine HCl IV Soln 200 MCG/2ML: INTRAVENOUS | Qty: 4 | Status: AC

## 2011-09-25 MED FILL — Cefuroxime Sodium For Inj 1.5 GM: INTRAMUSCULAR | Qty: 1.5 | Status: AC

## 2011-09-25 MED FILL — Phenylephrine HCl Inj 10 MG/ML: INTRAMUSCULAR | Qty: 2 | Status: AC

## 2011-09-25 MED FILL — Epinephrine HCl Inj 1 MG/ML: INTRAMUSCULAR | Qty: 4 | Status: AC

## 2011-09-25 NOTE — Progress Notes (Signed)
                   [address]            [name] [address]          [phone]       Patient examined and record reviewed.Hemodynamics stable,labs satisfactory.Patient had stable day.Continue current care.  D/C swan and get OOB, start lasix in amLabs this pm ok   VAN TRIGT III,Renardo Cheatum 09/25/2011

## 2011-09-25 NOTE — Progress Notes (Signed)
SUBJECTIVE:  S/p mitral valve replacement,tricuspid valve repair and Maze on 11/13. She was extubated this am. Complains of pain.    Filed Vitals:   09/25/11 0615 09/25/11 0630 09/25/11 0645 09/25/11 0700  BP:   96/66 102/51  Pulse: 92 96 95 95  Temp: 100 F (37.8 C) 100 F (37.8 C) 100 F (37.8 C) 100 F (37.8 C)  TempSrc:    Core (Comment)  Resp: 17 23 20 24   Height:      Weight:      SpO2: 98% 97% 97% 97%    Intake/Output Summary (Last 24 hours) at 09/25/11 0810 Last data filed at 09/25/11 0700  Gross per 24 hour  Intake 9497.38 ml  Output   5725 ml  Net 3772.38 ml    LABS: Basic Metabolic Panel:  Basename 09/25/11 0354 09/24/11 2253 09/24/11 2249  NA 144 -- 142  K 3.7 -- 3.9  CL 107 -- 106  CO2 23 -- --  GLUCOSE 187* -- 233*  BUN 22 -- 22  CREATININE 1.02 0.93 --  CALCIUM 8.5 -- --  MG 2.3 2.7* --  PHOS -- -- --   Liver Function Tests: No results found for this basename: AST:2,ALT:2,ALKPHOS:2,BILITOT:2,PROT:2,ALBUMIN:2 in the last 72 hours No results found for this basename: LIPASE:2,AMYLASE:2 in the last 72 hours CBC:  Basename 09/25/11 0354 09/24/11 2253  WBC 17.1* 18.3*  NEUTROABS -- --  HGB 9.6* 9.5*  HCT 28.5* 28.6*  MCV 84.8 85.1  PLT 127* 125*   Cardiac Enzymes: No results found for this basename: CKTOTAL:3,CKMB:3,CKMBINDEX:3,TROPONINI:3 in the last 72 hours BNP: No results found for this basename: POCBNP:3 in the last 72 hours D-Dimer:  Basename 09/24/11 1435  DDIMER 0.94*   Hemoglobin A1C: No results found for this basename: HGBA1C in the last 72 hours Fasting Lipid Panel: No results found for this basename: CHOL,HDL,LDLCALC,TRIG,CHOLHDL,LDLDIRECT in the last 72 hours Thyroid Function Tests: No results found for this basename: TSH,T4TOTAL,FREET3,T3FREE,THYROIDAB in the last 72 hours Anemia Panel: No results found for this basename: VITAMINB12,FOLATE,FERRITIN,TIBC,IRON,RETICCTPCT in the last 72 hours  RADIOLOGY: Dg Chest 2 View  Within 2 Weeks Of Surgery.  09/20/2011  *RADIOLOGY REPORT*  Clinical Data: Preoperative assessment for mitral valve replacement, shortness of breath, hypertension, diabetes, history asthma  CHEST - 2 VIEW  Comparison: 09/06/2011  Findings: Enlargement of cardiac silhouette. Atherosclerotic calcification aorta. Mediastinal contours and pulmonary vascularity normal. Emphysematous and bronchitic changes. Scarring in the mid lungs bilaterally and right base. No infiltrate, pleural effusion or pneumothorax. Scattered end plate spur formation thoracic spine.  IMPRESSION: Enlargement of cardiac silhouette. Emphysematous and bronchitic changes with scattered areas of scarring. No acute infiltrate.  Original Report Authenticated By: Lollie Marrow, M.D.    PHYSICAL EXAM General: Well developed, well nourished, in no acute distress Head: Eyes PERRLA, No xanthomas.   Normal cephalic and atramatic  Lungs: Clear bilaterally to auscultation and percussion. Heart: HRRR S1 S2, with SEM.  Pulses are 2+ & equal.            No carotid bruit. No JVD.  No abdominal bruits. No femoral bruits. Abdomen: Bowel sounds are positive, abdomen soft and non-tender without masses or                  Hernia's noted. Msk:  Back normal, normal gait. Normal strength and tone for age. Extremities: No clubbing, cyanosis or edema.  DP +1 Neuro: Alert and oriented X 3. Psych:  Good affect, responds appropriately  TELEMETRY: Reviewed telemetry pt  in junctional rhythm, RBBB, LAFB.   ASSESSMENT AND PLAN:  1. Mitral stenosis/regurgitation: s/p replacement with a bioprosthetic valve. Still on Milrinone, Levophed and Dopamine. Doing reasonably well but with significant pain.  2. Pulmonary hypertension with severe TR: s/p tricuspid valve repair.  3. A-fib: s/p Maze.   Lorine Bears, MD, [facility] 09/25/2011 8:10 AM

## 2011-09-25 NOTE — Clinical Documentation Improvement (Signed)
Anemia Blood Loss Clarification  Dear Dr.Lorelai Huyser Marton Redwood  In an effort to better capture your patient's severity of illness, reflect appropriate length of stay and utilization of resources, a review of the patient medical record has revealed the following indicators.    Based on your clinical judgment, please clarify and document in a progress note and/or discharge summary the clinical condition associated with the following supporting information:  In responding to this query please exercise your independent judgment.  The fact that a query is asked, does not imply that any particular answer is desired or expected.  Possible Clinical Conditions?   " Expected Acute Blood Loss Anemia  " Acute Blood Loss Anemia  " Acute on chronic blood loss anemia  " Other Condition________________  " Cannot Clinically Determine     Diagnostics: H&H on 11/14:  9.6/28.5 H&H on 11/13:  6.1/17.8  Transfusion: Received 3 units PRBC's per Op note.   Reviewed:    Thank You,  Sincerely, Rodman Pickle, RN,BSN,  Clinical Documentation Specialist:  Pager: [phone]  Health Information Management   #1

## 2011-09-25 NOTE — Procedures (Signed)
Extubation Procedure Note  Patient Details:   Name: [name] DOB: 01-08-1941 MRN: [id number]   Airway Documentation:  Patient extubated to 4 lpm nasal cannula.  Tolerated well, no complications.  Evaluation  O2 sats: stable throughout Complications: No apparent complications Patient did tolerate procedure well.  Suctioning: Airway Yes, pateint able to vocalize.  [name], Aloha Gell 09/25/2011, 5:17 AM

## 2011-09-25 NOTE — Progress Notes (Signed)
   CARDIOTHORACIC SURGERY PROGRESS NOTE   R1 Day Post-Op Procedure(s) (LRB): TRICUSPID VALVE REPLACEMENT (N/A) MAZE (N/A) MITRAL VALVE (MV) REPLACEMENT (N/A)  Subjective: Extubated this am.  Complains of soreness in chest and back but otherwise feels okay.  Objective: Vital signs: Filed Vitals:   09/25/11 0800  BP: 105/40  Pulse: 90  Temp: 100.2 F (37.9 C)  Resp: 21    Hemodynamics: PAP: (47-72)/(19-29) 59/24 mmHg CO:  [4 L/min-4.6 L/min] 4.6 L/min CI:  [2.3 L/min/m2-2.7 L/min/m2] 2.7 L/min/m2  Physical Exam:  Rhythm:   NSR  Breath sounds: Coarse, symmetrical  Heart sounds:  RRR, no murmur  Incisions:  Dressings dry, intact  Abdomen:  Soft, non-tender  Extremities:  Warm, well-perfused   Intake/Output from previous day: 11/13 0701 - 11/14 0700 In: 9497.4 [P.O.:100; I.V.:4883.4; Blood:2344; NG/GT:120; IV Piggyback:2050] Out: 5725 [Urine:2890; Emesis/NG output:150; Blood:1975; Chest Tube:710] Intake/Output this shift: Total I/O In: 90 [P.O.:50; I.V.:40] Out: 380 [Urine:150; Chest Tube:230]  Lab Results:  [facility] 09/25/11 0354 09/24/11 2253  WBC 17.1* 18.3*  HGB 9.6* 9.5*  HCT 28.5* 28.6*  PLT 127* 125*   BMET:  Basename 09/25/11 0354 09/24/11 2253 09/24/11 2249  NA 144 -- 142  K 3.7 -- 3.9  CL 107 -- 106  CO2 23 -- --  GLUCOSE 187* -- 233*  BUN 22 -- 22  CREATININE 1.02 0.93 --  CALCIUM 8.5 -- --    CBG (last 3)   Basename 09/25/11 0747 09/25/11 0636 09/25/11 0531  GLUCAP 105* 118* 144*   ABG    Component Value Date/Time   PHART 7.339* 09/25/2011 0442   HCO3 22.5 09/25/2011 0442   TCO2 24 09/25/2011 0442   ACIDBASEDEF 3.0* 09/25/2011 0442   O2SAT 98.0 09/25/2011 0442     Assessment/Plan: S/P Procedure(s) (LRB): TRICUSPID VALVE REPLACEMENT (N/A) MAZE (N/A) MITRAL VALVE (MV) REPLACEMENT (N/A)  Stable POD #1 Wean drips as tolerated Mobilize D/C lines D/C tubes later today if dry  OWEN,CLARENCE H

## 2011-09-26 ENCOUNTER — Inpatient Hospital Stay (HOSPITAL_COMMUNITY): Payer: Medicare Other

## 2011-09-26 LAB — GLUCOSE, CAPILLARY
Glucose-Capillary: 105 mg/dL — ABNORMAL HIGH (ref 70–99)
Glucose-Capillary: 101 mg/dL — ABNORMAL HIGH (ref 70–99)
Glucose-Capillary: 130 mg/dL — ABNORMAL HIGH (ref 70–99)
Glucose-Capillary: 92 mg/dL (ref 70–99)
Glucose-Capillary: 104 mg/dL — ABNORMAL HIGH (ref 70–99)
Glucose-Capillary: 109 mg/dL — ABNORMAL HIGH (ref 70–99)
Glucose-Capillary: 104 mg/dL — ABNORMAL HIGH (ref 70–99)
Glucose-Capillary: 106 mg/dL — ABNORMAL HIGH (ref 70–99)
Glucose-Capillary: 103 mg/dL — ABNORMAL HIGH (ref 70–99)

## 2011-09-26 LAB — CBC
HCT: 32.3 % — ABNORMAL LOW (ref 36.0–46.0)
Hemoglobin: 10.7 g/dL — ABNORMAL LOW (ref 12.0–15.0)
MCH: 28.5 pg (ref 26.0–34.0)
MCHC: 33.1 g/dL (ref 30.0–36.0)
RDW: 16 % — ABNORMAL HIGH (ref 11.5–15.5)

## 2011-09-26 LAB — BASIC METABOLIC PANEL
BUN: 29 mg/dL — ABNORMAL HIGH (ref 6–23)
Creatinine, Ser: 1.41 mg/dL — ABNORMAL HIGH (ref 0.50–1.10)
GFR calc Af Amer: 43 mL/min — ABNORMAL LOW (ref >90)
GFR calc non Af Amer: 37 mL/min — ABNORMAL LOW (ref >90)
Glucose, Bld: 130 mg/dL — ABNORMAL HIGH (ref 70–99)

## 2011-09-26 MED ORDER — INSULIN GLARGINE 100 UNIT/ML ~~LOC~~ SOLN
20.0000 [IU] | Freq: Two times a day (BID) | SUBCUTANEOUS | Status: DC
  Administered 2011-09-26 – 2011-09-27 (×4): 20 [IU] via SUBCUTANEOUS
  Filled 2011-09-26: qty 3

## 2011-09-26 MED FILL — Electrolyte-R (PH 7.4) Solution: INTRAVENOUS | Qty: 5000 | Status: AC

## 2011-09-26 MED FILL — Sodium Chloride IV Soln 0.9%: INTRAVENOUS | Qty: 1000 | Status: AC

## 2011-09-26 MED FILL — Sodium Chloride Irrigation Soln 0.9%: Qty: 3000 | Status: AC

## 2011-09-26 MED FILL — Heparin Sodium (Porcine) Inj 1000 Unit/ML: INTRAMUSCULAR | Qty: 30 | Status: AC

## 2011-09-26 NOTE — Plan of Care (Signed)
Problem: Phase I Progression Outcomes Goal: Pain controlled with appropriate interventions Outcome: Not Progressing Patient has history of fibromylagia-with chronic c/o pain.

## 2011-09-26 NOTE — Progress Notes (Signed)
UR Completed.  [name] 09/26/2011  

## 2011-09-26 NOTE — Progress Notes (Signed)
   CARDIOTHORACIC SURGERY PROGRESS NOTE   R2 Days Post-Op Procedure(s) (LRB): TRICUSPID VALVE REPLACEMENT (N/A) MAZE (N/A) MITRAL VALVE (MV) REPLACEMENT (N/A)  Subjective: Not motivated at all.  No specific complaints.  Does feel better than yesterday.  Objective: Vital signs: Filed Vitals:   09/26/11 0738  BP:   Pulse:   Temp: 100.2 F (37.9 C)  Resp:     Hemodynamics: PAP: (59-75)/(16-37) 64/31 mmHg CO:  [3.7 L/min] 3.7 L/min CI:  [2.1 L/min/m2] 2.1 L/min/m2  Physical Exam:  Rhythm:   AVpaced with profound bradycardia underneath  Breath sounds: Shallow but clear  Heart sounds:  RRR  Incisions:  Dressings intact  Abdomen:  Soft, non-tender  Extremities:  Warm, adequately perfused   Intake/Output from previous day: 11/14 0701 - 11/15 0700 In: 1583.6 [P.O.:260; I.V.:1223.6; IV Piggyback:100] Out: 2160 [Urine:1040; Chest Tube:1120] Intake/Output this shift:    Lab Results:  Basename 09/26/11 0358 09/25/11 1742 09/25/11 1700  WBC 12.0* -- 23.6*  HGB 10.7* 11.9* --  HCT 32.3* 35.0* --  PLT 122* -- 124*   BMET:  Basename 09/26/11 0358 09/25/11 1742 09/25/11 0354  NA 135 140 --  K 4.5 4.5 --  CL 101 104 --  CO2 21 -- 23  GLUCOSE 130* 98 --  BUN 29* 24* --  CREATININE 1.41* 1.50* --  CALCIUM 7.7* -- 8.5    CBG (last 3)   Basename 09/26/11 0614 09/26/11 0511 09/26/11 0509  GLUCAP 108* 121* 92   ABG    Component Value Date/Time   PHART 7.364 09/25/2011 2200   HCO3 20.3 09/25/2011 2200   TCO2 21 09/25/2011 2200   ACIDBASEDEF 5.0* 09/25/2011 2200   O2SAT 91.0 09/25/2011 2200     Assessment/Plan: S/P Procedure(s) (LRB): TRICUSPID VALVE REPLACEMENT (N/A) MAZE (N/A) MITRAL VALVE (MV) REPLACEMENT (N/A)  Stable but still on low-dose levophed and dopamine for BP Very slow moving with poor motivation Chronic pain w/ long term narcotic dependence, fibromyalgia Severe bradycardia, AV paced Expected post-op acute blood loss anemia stable Expected  post-op volume excess  D/C mediastinal tubes Wean drips as tolerated D/C amiodarone, metoprolol and continue AV pacing Mobilize as much as possible Will need PT but wait until Aline out  [employer]

## 2011-09-27 ENCOUNTER — Inpatient Hospital Stay (HOSPITAL_COMMUNITY): Payer: Medicare Other

## 2011-09-27 LAB — BASIC METABOLIC PANEL
CO2: 22 mEq/L (ref 19–32)
Calcium: 7.5 mg/dL — ABNORMAL LOW (ref 8.4–10.5)
Creatinine, Ser: 1.23 mg/dL — ABNORMAL HIGH (ref 0.50–1.10)
GFR calc non Af Amer: 44 mL/min — ABNORMAL LOW (ref >90)

## 2011-09-27 LAB — GLUCOSE, CAPILLARY
Glucose-Capillary: 101 mg/dL — ABNORMAL HIGH (ref 70–99)
Glucose-Capillary: 130 mg/dL — ABNORMAL HIGH (ref 70–99)
Glucose-Capillary: 100 mg/dL — ABNORMAL HIGH (ref 70–99)

## 2011-09-27 LAB — CBC
MCH: 28.7 pg (ref 26.0–34.0)
MCHC: 33 g/dL (ref 30.0–36.0)
MCV: 87.1 fL (ref 78.0–100.0)
Platelets: 86 10*3/uL — ABNORMAL LOW (ref 150–400)
RDW: 15.7 % — ABNORMAL HIGH (ref 11.5–15.5)

## 2011-09-27 MED ORDER — INSULIN ASPART 100 UNIT/ML ~~LOC~~ SOLN
0.0000 [IU] | SUBCUTANEOUS | Status: DC
  Administered 2011-09-27: 2 [IU] via SUBCUTANEOUS
  Administered 2011-09-27: 4 [IU] via SUBCUTANEOUS
  Administered 2011-09-28 (×2): 2 [IU] via SUBCUTANEOUS
  Filled 2011-09-27: qty 3

## 2011-09-27 MED ORDER — WARFARIN SODIUM 2 MG PO TABS
2.0000 mg | ORAL_TABLET | Freq: Every day | ORAL | Status: DC
  Administered 2011-09-27 – 2011-09-28 (×2): 2 mg via ORAL
  Filled 2011-09-27 (×3): qty 1

## 2011-09-27 MED ORDER — INSULIN ASPART 100 UNIT/ML ~~LOC~~ SOLN: 0.0000 [IU] | SUBCUTANEOUS | Status: DC

## 2011-09-27 MED ORDER — FUROSEMIDE 10 MG/ML IJ SOLN
8.0000 mg/h | INTRAVENOUS | Status: DC
  Administered 2011-09-27 – 2011-09-30 (×2): 4 mg/h via INTRAVENOUS
  Administered 2011-10-01: 8 mg/h via INTRAVENOUS
  Filled 2011-09-27 (×10): qty 25

## 2011-09-27 MED ORDER — INSULIN GLARGINE 100 UNIT/ML ~~LOC~~ SOLN: 20.0000 [IU] | Freq: Two times a day (BID) | SUBCUTANEOUS | Status: DC

## 2011-09-27 NOTE — Progress Notes (Signed)
   CARDIOTHORACIC SURGERY PROGRESS NOTE   R3 Days Post-Op Procedure(s) (LRB): TRICUSPID VALVE REPLACEMENT (N/A) MAZE (N/A) MITRAL VALVE (MV) REPLACEMENT (N/A)  Subjective: Feels better.  Pain improved since mediastinal tubes out.  Objective: Vital signs: Filed Vitals:   09/27/11 0900  BP: 106/51  Pulse: 86  Temp:   Resp: 19    Hemodynamics:    Physical Exam:  Rhythm:   Sinus with 1st degree AV block  Breath sounds: clear  Heart sounds:  RRR  Incisions:  dry  Abdomen:  soft  Extremities:  warm   Intake/Output from previous day: 11/15 0701 - 11/16 0700 In: 2217.5 [P.O.:420; I.V.:1797.5] Out: 1940 [Urine:1040; Chest Tube:900] Intake/Output this shift: Total I/O In: 517.8 [P.O.:300; I.V.:217.8] Out: 175 [Urine:125; Chest Tube:50]  Lab Results:  [facility] 09/27/11 0355 09/26/11 0358  WBC 8.8 12.0*  HGB 9.1* 10.7*  HCT 27.6* 32.3*  PLT 86* 122*   BMET:  Basename 09/27/11 0355 09/26/11 0358  NA 134* 135  K 4.1 4.5  CL 102 101  CO2 22 21  GLUCOSE 118* 130*  BUN 37* 29*  CREATININE 1.23* 1.41*  CALCIUM 7.5* 7.7*    CBG (last 3)   Basename 09/27/11 0703 09/27/11 0356 09/27/11 0223  GLUCAP 101* 114* 99   ABG    Component Value Date/Time   PHART 7.364 09/25/2011 2200   HCO3 20.3 09/25/2011 2200   TCO2 21 09/25/2011 2200   ACIDBASEDEF 5.0* 09/25/2011 2200   O2SAT 91.0 09/25/2011 2200     Assessment/Plan: S/P Procedure(s) (LRB): TRICUSPID VALVE REPLACEMENT (N/A) MAZE (N/A) MITRAL VALVE (MV) REPLACEMENT (N/A)  Stable POD#3 but still on low dose levophed and dopamine for BP Expected postop volume excess but holding on diuretics due to BP Limited physical mobility pre and postop Stable expected acute blood loss anemia Maintaining NSR so far s/p Maze Still some serous drainage from pleural tubes  Will wean drips as tolerated Mobilize Leave pacer off Start coumadin D/C Aline   OWEN,CLARENCE H

## 2011-09-27 NOTE — Progress Notes (Signed)
TCTS BRIEF SICU PROGRESS NOTE  3 Days Post-Op  S/P Procedure(s) (LRB): TRICUSPID VALVE REPLACEMENT (N/A) MAZE (N/A) MITRAL VALVE (MV) REPLACEMENT (N/A)    Stable day.  Didn't tolerate walk.  Off levophed. Still on dopamine   Assessment/Plan:  Will start low dose lasix drip  OWEN,CLARENCE H

## 2011-09-28 ENCOUNTER — Inpatient Hospital Stay (HOSPITAL_COMMUNITY): Payer: Medicare Other

## 2011-09-28 LAB — TYPE AND SCREEN
Unit division: 0
Unit division: 0
Unit division: 0

## 2011-09-28 LAB — CBC
HCT: 28.4 % — ABNORMAL LOW (ref 36.0–46.0)
MCH: 28.9 pg (ref 26.0–34.0)
MCV: 88.2 fL (ref 78.0–100.0)
RBC: 3.22 MIL/uL — ABNORMAL LOW (ref 3.87–5.11)
WBC: 11 10*3/uL — ABNORMAL HIGH (ref 4.0–10.5)

## 2011-09-28 LAB — GLUCOSE, CAPILLARY
Glucose-Capillary: 119 mg/dL — ABNORMAL HIGH (ref 70–99)
Glucose-Capillary: 142 mg/dL — ABNORMAL HIGH (ref 70–99)
Glucose-Capillary: 50 mg/dL — ABNORMAL LOW (ref 70–99)
Glucose-Capillary: 60 mg/dL — ABNORMAL LOW (ref 70–99)
Glucose-Capillary: 70 mg/dL (ref 70–99)
Glucose-Capillary: 70 mg/dL (ref 70–99)

## 2011-09-28 LAB — BASIC METABOLIC PANEL
BUN: 40 mg/dL — ABNORMAL HIGH (ref 6–23)
CO2: 24 mEq/L (ref 19–32)
Chloride: 102 mEq/L (ref 96–112)
Creatinine, Ser: 1.21 mg/dL — ABNORMAL HIGH (ref 0.50–1.10)

## 2011-09-28 MED ORDER — INSULIN GLARGINE 100 UNIT/ML ~~LOC~~ SOLN
20.0000 [IU] | Freq: Every day | SUBCUTANEOUS | Status: DC
  Administered 2011-09-28: 20 [IU] via SUBCUTANEOUS

## 2011-09-28 NOTE — Progress Notes (Addendum)
CRITICAL VALUE ALERT  Critical value received:  CBG 50  Date of notification:  09/28/11   Time of notification:  0815  Critical value read back:yes  Nurse who received alert:  Orvis Brill, RN   Pt asymptomatic; A&O x 4. Treated according to hypoglycemia protocol with apple juice and graham crackers.  Repeat CBG = 60, with pt in the process of eating breakfast.  Pt had poor appetite throughout yesterday, and did receive 20 units Lantus bid (possible contributing factors).

## 2011-09-28 NOTE — Progress Notes (Signed)
Subjective: Complains of soreness, weak. Appetite OK.   Objective: Temp:  [98 F (36.7 C)-98.8 F (37.1 C)] 98.5 F (36.9 C) (11/17 0800) Pulse Rate:  [73-85] 74  (11/17 0800) Resp:  [7-26] 15  (11/17 0800) BP: (74-104)/(28-86) 99/46 mmHg (11/17 0800) SpO2:  [100 %] 100 % (11/17 0800) Weight:  [182 lb 1.6 oz (82.6 kg)] 182 lb 1.6 oz (82.6 kg) (11/17 0600)  Weight change: 4 lb 3 oz (1.9 kg)  I/O last 3 completed shifts: In: 2513.8 [P.O.:1080; I.V.:1429.8; IV Piggyback:4] Out: 2995 [Urine:1540; Chest Tube:1455]  Telemetry - Sinus rhythm  Exam -   General - NAD.  Lungs - Decreased at bases with rhonchi  Cardiac - RRR no rub.  Abdomen - NABS.  Extremities - No pitting.  Testing -   Lab Results  Component Value Date   WBC 11.0* 09/28/2011   HGB 9.3* 09/28/2011   HCT 28.4* 09/28/2011   MCV 88.2 09/28/2011   PLT 105* 09/28/2011    Lab Results  Component Value Date   CREATININE 1.21* 09/28/2011   BUN 40* 09/28/2011   NA 134* 09/28/2011   K 4.2 09/28/2011   CL 102 09/28/2011   CO2 24 09/28/2011    Current Medications    . acetaminophen  1,000 mg Oral Q6H   Or  . acetaminophen (TYLENOL) oral liquid 160 mg/5 mL  975 mg Per Tube Q6H  . amitriptyline  150 mg Oral QHS  . aspirin EC  325 mg Oral Daily   Or  . aspirin  324 mg Per Tube Daily  . bisacodyl  10 mg Oral Daily   Or  . bisacodyl  10 mg Rectal Daily  . chlorhexidine  15 mL Mouth/Throat BID  . cyanocobalamin  1,000 mcg Intramuscular Q30 days  . docusate sodium  200 mg Oral Daily  . folic acid  2 mg Oral Daily  . insulin aspart  0-24 Units Subcutaneous Q4H  . insulin glargine  20 Units Subcutaneous BID  . letrozole  2.5 mg Oral Daily  . levothyroxine  137 mcg Oral QAC breakfast  . pantoprazole  40 mg Oral Q1200  . sodium chloride  3 mL Intravenous Q12H  . warfarin  2 mg Oral q1800      . sodium chloride 20 mL/hr at 09/25/11 2000  . sodium chloride 20 mL/hr at 09/27/11 0800  . sodium chloride     . DOPamine 3.5 mcg/kg/min (09/27/11 1822)  . furosemide (LASIX) infusion 4 mg/hr (09/28/11 0630)  . DISCONTD: DOPamine 4 mcg/kg/min (09/28/11 0630)  . DISCONTD: lactated ringers 20 mL/hr at 09/25/11 2300  . DISCONTD: nitroGLYCERIN    . DISCONTD: norepinephrine (LEVOPHED) Adult infusion Stopped (09/27/11 1000)   CXR 11/16: 1. Mildly improved aeration in the right lung base. 2. No pneumothorax is definitely seen, although the patient's chin and facial tissues obscure portions of the lung apices. 3. Stable mild left basilar atelectasis.   Assessment:  1. Status post bioprosthetic MVR, tricuspid ring, and Maze - POD 4. Pressors weaning, on renal dose Dopamine and Lasix GTT at this point. Rhythm remains sinus. Coumadin started, INR 1.8.  2. 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  LV and RV function normal. PA systolics in 60's as of 11/14 with Swan in place post-op. Hopefully there will be reactive component to this and improvement following surgical intervention. She likely has component of preload dependence that will limit diuresis and blood pressure control measures.  Plan:  Discussed  case with Dr. Cornelius Moras. Increase activity as tolerated. Continue to wean Dopamine as tolerated.   Jonelle Sidle, M.D., F.A.C.C.

## 2011-09-28 NOTE — Progress Notes (Signed)
   CARDIOTHORACIC SURGERY PROGRESS NOTE   R4 Days Post-Op Procedure(s) (LRB): TRICUSPID VALVE REPLACEMENT (N/A) MAZE (N/A) MITRAL VALVE (MV) REPLACEMENT (N/A)  Subjective: Feels better.  Still not motivated to ambulate.  Eating well. No shortness of breath  Objective: Vital signs: Filed Vitals:   09/28/11 0800  BP: 99/46  Pulse: 74  Temp: 98.5 F (36.9 C)  Resp: 15    Hemodynamics:    Physical Exam:  Rhythm:   sinus  Breath sounds: clear  Heart sounds:  RRR  Incisions:  Dressings dry  Abdomen:  soft  Extremities:  warm   Intake/Output from previous day: 11/16 0701 - 11/17 0700 In: 2020.2 [P.O.:1080; I.V.:936.2; IV Piggyback:4] Out: 2065 [Urine:1030; Chest Tube:1035] Intake/Output this shift: Total I/O In: 120 [P.O.:120] Out: -   Lab Results:  Basename 09/28/11 0400 09/27/11 0355  WBC 11.0* 8.8  HGB 9.3* 9.1*  HCT 28.4* 27.6*  PLT 105* 86*   BMET:  Basename 09/28/11 0400 09/27/11 0355  NA 134* 134*  K 4.2 4.1  CL 102 102  CO2 24 22  GLUCOSE 78 118*  BUN 40* 37*  CREATININE 1.21* 1.23*  CALCIUM 7.4* 7.5*    CBG (last 3)   Basename 09/28/11 0843 09/28/11 0816 09/28/11 0421  GLUCAP 60* 50* 70   ABG    Component Value Date/Time   PHART 7.364 09/25/2011 2200   HCO3 20.3 09/25/2011 2200   TCO2 21 09/25/2011 2200   ACIDBASEDEF 5.0* 09/25/2011 2200   O2SAT 91.0 09/25/2011 2200   INR 1.8  Assessment/Plan: S/P Procedure(s) (LRB): TRICUSPID VALVE REPLACEMENT (N/A) MAZE (N/A) MITRAL VALVE (MV) REPLACEMENT (N/A)  Still draining serous fluid from both pleural tubes Still on low dose dopamine for BP CXR and labs stable INR 1.8 with only one dose coumadin  Needs PICC line Mobilize as much as possible Wean dopamine as tolerated   OWEN,CLARENCE H

## 2011-09-29 ENCOUNTER — Inpatient Hospital Stay (HOSPITAL_COMMUNITY): Payer: Medicare Other

## 2011-09-29 LAB — BASIC METABOLIC PANEL
CO2: 25 mEq/L (ref 19–32)
Calcium: 7.1 mg/dL — ABNORMAL LOW (ref 8.4–10.5)
GFR calc non Af Amer: 58 mL/min — ABNORMAL LOW (ref >90)
Potassium: 3.5 mEq/L (ref 3.5–5.1)
Sodium: 139 mEq/L (ref 135–145)

## 2011-09-29 LAB — CBC
HCT: 29.6 % — ABNORMAL LOW (ref 36.0–46.0)
Hemoglobin: 9.6 g/dL — ABNORMAL LOW (ref 12.0–15.0)
MCV: 88.4 fL (ref 78.0–100.0)
Platelets: 136 10*3/uL — ABNORMAL LOW (ref 150–400)
WBC: 15.3 10*3/uL — ABNORMAL HIGH (ref 4.0–10.5)

## 2011-09-29 LAB — GLUCOSE, CAPILLARY
Glucose-Capillary: 137 mg/dL — ABNORMAL HIGH (ref 70–99)
Glucose-Capillary: 63 mg/dL — ABNORMAL LOW (ref 70–99)
Glucose-Capillary: 96 mg/dL (ref 70–99)

## 2011-09-29 MED ORDER — ASPIRIN 81 MG PO CHEW: 81.0000 mg | CHEWABLE_TABLET | Freq: Every day | ORAL | Status: DC

## 2011-09-29 MED ORDER — DEXTROSE 50 % IV SOLN
25.0000 mL | Freq: Once | INTRAVENOUS | Status: AC | PRN
  Administered 2011-09-29: 25 mL via INTRAVENOUS

## 2011-09-29 MED ORDER — WARFARIN SODIUM 1 MG PO TABS
1.0000 mg | ORAL_TABLET | Freq: Every day | ORAL | Status: DC
  Administered 2011-09-29 – 2011-09-30 (×2): 1 mg via ORAL
  Filled 2011-09-29 (×3): qty 1

## 2011-09-29 MED ORDER — DEXTROSE 50 % IV SOLN
INTRAVENOUS | Status: AC
  Administered 2011-09-29: 25 mL via INTRAVENOUS
  Filled 2011-09-29: qty 50

## 2011-09-29 MED ORDER — GLUCOSE-VITAMIN C 4-6 GM-MG PO CHEW
CHEWABLE_TABLET | ORAL | Status: AC
  Filled 2011-09-29: qty 1

## 2011-09-29 MED ORDER — POTASSIUM CHLORIDE 10 MEQ/50ML IV SOLN
10.0000 meq | INTRAVENOUS | Status: AC
  Administered 2011-09-29 (×3): 10 meq via INTRAVENOUS
  Filled 2011-09-29 (×2): qty 50

## 2011-09-29 MED ORDER — POTASSIUM CHLORIDE 10 MEQ/50ML IV SOLN
INTRAVENOUS | Status: AC
  Administered 2011-09-29: 10 meq via INTRAVENOUS
  Filled 2011-09-29: qty 50

## 2011-09-29 MED ORDER — ASPIRIN EC 81 MG PO TBEC
81.0000 mg | DELAYED_RELEASE_TABLET | Freq: Every day | ORAL | Status: DC
  Administered 2011-09-30 – 2011-10-01 (×2): 81 mg via ORAL
  Filled 2011-09-29 (×2): qty 1

## 2011-09-29 MED ORDER — GLUCOSE-VITAMIN C 4-6 GM-MG PO CHEW
4.0000 | CHEWABLE_TABLET | ORAL | Status: DC | PRN
  Administered 2011-09-29: 01:00:00 via ORAL

## 2011-09-29 NOTE — Progress Notes (Signed)
CBG: 54  Treatment: 15 GM carbohydrate snack  Symptoms: None  Follow-up CBG: Time:0900 CBG Result:46  Possible Reasons for Event: Inadequate meal intake and Medication regimen:   Comments/MD notified: Will recheck CBG after pt finishes breakfast tray.      Bowen, Aimee Heldman Chantelle

## 2011-09-29 NOTE — Progress Notes (Signed)
CBG: 63  Treatment: 15 GM carbohydrate snack  Symptoms: None  Follow-up CBG: Time:0051 CBG Result:52  Possible Reasons for Event: Unknown  Comments/MD notified:none    Carlean Jews

## 2011-09-29 NOTE — Progress Notes (Signed)
   CARDIOTHORACIC SURGERY PROGRESS NOTE   R5 Days Post-Op Procedure(s) (LRB): TRICUSPID VALVE REPLACEMENT (N/A) MAZE (N/A) MITRAL VALVE (MV) REPLACEMENT (N/A)  Subjective: Feels much better today. No complaints  Objective: Vital signs: Filed Vitals:   09/29/11 0800  BP:   Pulse:   Temp: 98.4 F (36.9 C)  Resp:     Hemodynamics:    Physical Exam:  Rhythm:   sinus  Breath sounds: clear  Heart sounds:  RRR  Incisions:  dry  Abdomen:  soft  Extremities:  warm   Intake/Output from previous day: 11/17 0701 - 11/18 0700 In: 1570.5 [P.O.:720; I.V.:823.5; IV Piggyback:2] Out: 2775 [Urine:2025; Chest Tube:750] Intake/Output this shift: Total I/O In: 100.5 [P.O.:60; I.V.:40.5] Out: 50 [Chest Tube:50]  Lab Results:  Basename 09/29/11 1020 09/28/11 0400  WBC 15.3* 11.0*  HGB 9.6* 9.3*  HCT 29.6* 28.4*  PLT 136* 105*   BMET:  Basename 09/29/11 1020 09/28/11 0400  NA 139 134*  K 3.5 4.2  CL 103 102  CO2 25 24  GLUCOSE 158* 78  BUN 33* 40*  CREATININE 0.98 1.21*  CALCIUM 7.1* 7.4*    CBG (last 3)   Basename 09/29/11 0927 09/29/11 0859 09/29/11 0255  GLUCAP 56* 46* 137*   ABG    Component Value Date/Time   PHART 7.364 09/25/2011 2200   HCO3 20.3 09/25/2011 2200   TCO2 21 09/25/2011 2200   ACIDBASEDEF 5.0* 09/25/2011 2200   O2SAT 91.0 09/25/2011 2200   INR  2.24  Assessment/Plan: S/P Procedure(s) (LRB): TRICUSPID VALVE REPLACEMENT (N/A) MAZE (N/A) MITRAL VALVE (MV) REPLACEMENT (N/A)  Doing much better.  Diuresing very well now.  Still on low dose dopamine. Rhythm stable. Right pleural tube still draining serous fluid.   Continue lasix Wean dopamine D/c left pleural tube Watch WBC Decrease coumadin dose Mobilize Needs PICC ASAP - central line old D/c lantus insulin  [name] H

## 2011-09-29 NOTE — Progress Notes (Signed)
CBG: 52  Treatment: 3 glucose tabs  Symptoms: None  Follow-up CBG: Time:0200 CBG Result:58  Possible Reasons for Event: Unknown  Comments/MD notified:none    [name]

## 2011-09-29 NOTE — Progress Notes (Signed)
CBG: 58  Treatment: D50 IV 25 mL  Symptoms: None  Follow-up CBG: Time:0215 CBG Result:67  Possible Reasons for Event: Unknown  Comments/MD notified:none    [name] Dupre [employer]ges[employer]po

## 2011-09-29 NOTE — Progress Notes (Signed)
CBG: 67  Treatment: D50 IV 25 mL  Symptoms: None  Follow-up CBG: Time:0255 CBG Result:137  Possible Reasons for Event: Change in activity  Comments/MD notified:None    Nolon Nations

## 2011-09-30 ENCOUNTER — Inpatient Hospital Stay (HOSPITAL_COMMUNITY): Payer: Medicare Other

## 2011-09-30 LAB — GLUCOSE, CAPILLARY
Glucose-Capillary: 124 mg/dL — ABNORMAL HIGH (ref 70–99)
Glucose-Capillary: 96 mg/dL (ref 70–99)
Glucose-Capillary: 61 mg/dL — ABNORMAL LOW (ref 70–99)
Glucose-Capillary: 102 mg/dL — ABNORMAL HIGH (ref 70–99)
Glucose-Capillary: 104 mg/dL — ABNORMAL HIGH (ref 70–99)

## 2011-09-30 LAB — BASIC METABOLIC PANEL
GFR calc Af Amer: 66 mL/min — ABNORMAL LOW (ref >90)
GFR calc non Af Amer: 57 mL/min — ABNORMAL LOW (ref >90)
Potassium: 3.5 mEq/L (ref 3.5–5.1)
Sodium: 138 mEq/L (ref 135–145)

## 2011-09-30 LAB — CBC
MCHC: 32.4 g/dL (ref 30.0–36.0)
RDW: 15.9 % — ABNORMAL HIGH (ref 11.5–15.5)
WBC: 13.4 10*3/uL — ABNORMAL HIGH (ref 4.0–10.5)

## 2011-09-30 LAB — PROTIME-INR
INR: 2.15 — ABNORMAL HIGH (ref 0.00–1.49)
Prothrombin Time: 24.4 seconds — ABNORMAL HIGH (ref 11.6–15.2)

## 2011-09-30 MED ORDER — DEXTROSE 50 % IV SOLN
INTRAVENOUS | Status: AC
  Administered 2011-09-30: 50 mL
  Filled 2011-09-30: qty 50

## 2011-09-30 MED ORDER — POTASSIUM CHLORIDE CRYS ER 20 MEQ PO TBCR
20.0000 meq | EXTENDED_RELEASE_TABLET | Freq: Two times a day (BID) | ORAL | Status: DC
  Administered 2011-09-30 – 2011-10-07 (×15): 20 meq via ORAL
  Filled 2011-09-30 (×17): qty 1

## 2011-09-30 MED ORDER — AMIODARONE HCL 200 MG PO TABS
200.0000 mg | ORAL_TABLET | Freq: Every day | ORAL | Status: DC
  Administered 2011-09-30: 200 mg via ORAL
  Filled 2011-09-30 (×2): qty 1

## 2011-09-30 MED ORDER — ONDANSETRON HCL 4 MG PO TABS: 4.0000 mg | ORAL_TABLET | Freq: Four times a day (QID) | ORAL | Status: DC | PRN

## 2011-09-30 MED ORDER — ACETAMINOPHEN 325 MG PO TABS: 650.0000 mg | ORAL_TABLET | Freq: Four times a day (QID) | ORAL | Status: DC | PRN

## 2011-09-30 MED ORDER — SODIUM CHLORIDE 0.9 % IJ SOLN
10.0000 mL | INTRAMUSCULAR | Status: DC | PRN
  Administered 2011-10-01 (×2): 10 mL

## 2011-09-30 MED ORDER — POVIDONE-IODINE 10 % EX SOLN
1.0000 "application " | Freq: Two times a day (BID) | CUTANEOUS | Status: DC
  Administered 2011-09-30 – 2011-10-15 (×30): 1 via TOPICAL
  Filled 2011-09-30: qty 15

## 2011-09-30 MED ORDER — INSULIN ASPART 100 UNIT/ML ~~LOC~~ SOLN
0.0000 [IU] | Freq: Three times a day (TID) | SUBCUTANEOUS | Status: DC
  Administered 2011-09-30 – 2011-10-01 (×4): 2 [IU] via SUBCUTANEOUS

## 2011-09-30 MED ORDER — SODIUM CHLORIDE 0.9 % IJ SOLN
3.0000 mL | Freq: Two times a day (BID) | INTRAMUSCULAR | Status: DC
  Administered 2011-10-01 – 2011-10-06 (×5): 3 mL via INTRAVENOUS

## 2011-09-30 MED ORDER — GUAIFENESIN ER 600 MG PO TB12
600.0000 mg | ORAL_TABLET | Freq: Two times a day (BID) | ORAL | Status: AC | PRN
  Administered 2011-09-30: 600 mg via ORAL
  Filled 2011-09-30 (×2): qty 1

## 2011-09-30 MED ORDER — MOVING RIGHT ALONG BOOK
Freq: Once | Status: AC
  Administered 2011-09-30: 19:00:00
  Filled 2011-09-30: qty 1

## 2011-09-30 MED ORDER — METOPROLOL TARTRATE 12.5 MG HALF TABLET
12.5000 mg | ORAL_TABLET | Freq: Two times a day (BID) | ORAL | Status: DC
  Administered 2011-09-30 – 2011-10-01 (×3): 12.5 mg via ORAL
  Filled 2011-09-30 (×4): qty 1

## 2011-09-30 MED ORDER — SODIUM CHLORIDE 0.9 % IV SOLN
250.0000 mL | INTRAVENOUS | Status: DC
  Administered 2011-09-30 – 2011-10-01 (×2): 250 mL via INTRAVENOUS

## 2011-09-30 MED ORDER — ROSUVASTATIN CALCIUM 10 MG PO TABS
10.0000 mg | ORAL_TABLET | Freq: Every day | ORAL | Status: DC
  Administered 2011-10-01 – 2011-10-15 (×14): 10 mg via ORAL
  Filled 2011-09-30 (×15): qty 1

## 2011-09-30 MED ORDER — POTASSIUM CHLORIDE 10 MEQ/50ML IV SOLN
INTRAVENOUS | Status: AC
  Filled 2011-09-30: qty 50

## 2011-09-30 MED ORDER — SODIUM CHLORIDE 0.9 % IJ SOLN
3.0000 mL | INTRAMUSCULAR | Status: DC | PRN
  Administered 2011-10-05: 3 mL via INTRAVENOUS

## 2011-09-30 MED ORDER — ALUM & MAG HYDROXIDE-SIMETH 400-400-40 MG/5ML PO SUSP
15.0000 mL | ORAL | Status: DC | PRN
  Filled 2011-09-30: qty 30

## 2011-09-30 MED ORDER — POTASSIUM CHLORIDE 10 MEQ/50ML IV SOLN
10.0000 meq | INTRAVENOUS | Status: DC | PRN
  Administered 2011-09-30 (×2): 10 meq via INTRAVENOUS
  Filled 2011-09-30: qty 50
  Filled 2011-09-30: qty 100

## 2011-09-30 MED ORDER — METHYLPREDNISOLONE SODIUM SUCC 125 MG IJ SOLR
60.0000 mg | Freq: Once | INTRAMUSCULAR | Status: AC
  Administered 2011-09-30: 60 mg via INTRAVENOUS
  Filled 2011-09-30: qty 0.96

## 2011-09-30 NOTE — Progress Notes (Signed)
   CARDIOTHORACIC SURGERY PROGRESS NOTE   R6 Days Post-Op Procedure(s) (LRB): TRICUSPID VALVE REPLACEMENT (N/A) MAZE (N/A) MITRAL VALVE (MV) REPLACEMENT (N/A)  Subjective: Feels well. Still some SOB  Objective: Vital signs: Filed Vitals:   09/30/11 0800  BP: 115/52  Pulse: 92  Temp:   Resp: 17    Hemodynamics:    Physical Exam:  Rhythm:   sinus  Breath sounds: coarse  Heart sounds:  RRR  Incisions:  dry  Abdomen:  soft  Extremities:  warm   Intake/Output from previous day: 11/18 0701 - 11/19 0700 In: 1605.3 [P.O.:600; I.V.:855.3; IV Piggyback:150] Out: 2866 [Urine:2475; Stool:1; Chest Tube:390] Intake/Output this shift: Total I/O In: 90 [I.V.:40; IV Piggyback:50] Out: 250 [Urine:250]  Lab Results:  [facility] 09/30/11 0349 09/29/11 1020  WBC 13.4* 15.3*  HGB 8.9* 9.6*  HCT 27.5* 29.6*  PLT 131* 136*   BMET:  Basename 09/30/11 0349 09/29/11 1020  NA 138 139  K 3.5 3.5  CL 101 103  CO2 28 25  GLUCOSE 99 158*  BUN 26* 33*  CREATININE 1.00 0.98  CALCIUM 7.2* 7.1*    CBG (last 3)   Basename 09/30/11 0718 09/29/11 2332 09/29/11 1919  GLUCAP 61* 95 108*   ABG    Component Value Date/Time   PHART 7.364 09/25/2011 2200   HCO3 20.3 09/25/2011 2200   TCO2 21 09/25/2011 2200   ACIDBASEDEF 5.0* 09/25/2011 2200   O2SAT 91.0 09/25/2011 2200   INR  2.15  Assessment/Plan: S/P Procedure(s) (LRB): TRICUSPID VALVE REPLACEMENT (N/A) MAZE (N/A) MITRAL VALVE (MV) REPLACEMENT (N/A)  Progressing well. Now stable off all drips. Mobilize. Transfer step down. Continue coumadin.  Sharne Linders H

## 2011-10-01 ENCOUNTER — Inpatient Hospital Stay (HOSPITAL_COMMUNITY): Payer: Medicare Other

## 2011-10-01 DIAGNOSIS — I5032 Chronic diastolic (congestive) heart failure: Secondary | ICD-10-CM | POA: Diagnosis present

## 2011-10-01 LAB — PROTIME-INR
INR: 1.73 — ABNORMAL HIGH (ref 0.00–1.49)
Prothrombin Time: 20.6 seconds — ABNORMAL HIGH (ref 11.6–15.2)

## 2011-10-01 LAB — BASIC METABOLIC PANEL
BUN: 19 mg/dL (ref 6–23)
GFR calc Af Amer: 83 mL/min — ABNORMAL LOW (ref >90)
GFR calc non Af Amer: 72 mL/min — ABNORMAL LOW (ref >90)
Potassium: 4.2 mEq/L (ref 3.5–5.1)
Sodium: 139 mEq/L (ref 135–145)

## 2011-10-01 LAB — GLUCOSE, CAPILLARY: Glucose-Capillary: 120 mg/dL — ABNORMAL HIGH (ref 70–99)

## 2011-10-01 LAB — CBC
HCT: 27.7 % — ABNORMAL LOW (ref 36.0–46.0)
MCHC: 31.8 g/dL (ref 30.0–36.0)
Platelets: 151 10*3/uL (ref 150–400)
RDW: 16.2 % — ABNORMAL HIGH (ref 11.5–15.5)
WBC: 14.4 10*3/uL — ABNORMAL HIGH (ref 4.0–10.5)

## 2011-10-01 MED ORDER — AMITRIPTYLINE HCL 75 MG PO TABS
150.0000 mg | ORAL_TABLET | Freq: Every day | ORAL | Status: DC
  Administered 2011-10-01 – 2011-10-14 (×14): 150 mg via ORAL
  Filled 2011-10-01 (×15): qty 2

## 2011-10-01 MED ORDER — METOPROLOL TARTRATE 25 MG PO TABS
25.0000 mg | ORAL_TABLET | Freq: Two times a day (BID) | ORAL | Status: DC
  Filled 2011-10-01: qty 1

## 2011-10-01 MED ORDER — METOPROLOL TARTRATE 50 MG PO TABS
50.0000 mg | ORAL_TABLET | Freq: Two times a day (BID) | ORAL | Status: DC
  Administered 2011-10-01 – 2011-10-02 (×3): 50 mg via ORAL
  Filled 2011-10-01 (×6): qty 1

## 2011-10-01 MED ORDER — METOPROLOL TARTRATE 1 MG/ML IV SOLN
5.0000 mg | Freq: Once | INTRAVENOUS | Status: AC
  Administered 2011-10-01: 5 mg via INTRAVENOUS

## 2011-10-01 MED ORDER — AMIODARONE HCL 200 MG PO TABS
400.0000 mg | ORAL_TABLET | Freq: Two times a day (BID) | ORAL | Status: DC
  Administered 2011-10-01: 400 mg via ORAL
  Filled 2011-10-01 (×2): qty 2

## 2011-10-01 MED ORDER — AMIODARONE HCL 200 MG PO TABS
400.0000 mg | ORAL_TABLET | Freq: Three times a day (TID) | ORAL | Status: DC
  Administered 2011-10-01 – 2011-10-06 (×14): 400 mg via ORAL
  Filled 2011-10-01 (×18): qty 2

## 2011-10-01 MED ORDER — DEXTROSE 5 % IV SOLN
150.0000 mg | Freq: Once | INTRAVENOUS | Status: AC
  Administered 2011-10-01: 150 mg via INTRAVENOUS
  Filled 2011-10-01: qty 3

## 2011-10-01 MED ORDER — METOPROLOL TARTRATE 1 MG/ML IV SOLN
INTRAVENOUS | Status: AC
  Filled 2011-10-01: qty 5

## 2011-10-01 MED ORDER — WARFARIN SODIUM 2 MG PO TABS
2.0000 mg | ORAL_TABLET | Freq: Every day | ORAL | Status: DC
  Administered 2011-10-01 – 2011-10-04 (×4): 2 mg via ORAL
  Filled 2011-10-01 (×5): qty 1

## 2011-10-01 MED ORDER — GUAIFENESIN ER 600 MG PO TB12
600.0000 mg | ORAL_TABLET | Freq: Two times a day (BID) | ORAL | Status: DC
  Administered 2011-10-01 – 2011-10-06 (×12): 600 mg via ORAL
  Filled 2011-10-01 (×14): qty 1

## 2011-10-01 NOTE — Progress Notes (Signed)
Physical Therapy Evaluation Patient Details Name: [name] MRN: [id number] DOB: 1941-09-11 Today's Date: 10/01/2011  Problem List:  Patient Active Problem List  Diagnoses  . CARCINOMA, BREAST  . CARCINOMA, SQUAMOUS CELL  . RHEUMATIC FEVER  . CORONARY ATHEROSCLEROSIS NATIVE CORONARY ARTERY  . PULMONARY HYPERTENSION  . ALLERGIC RHINITIS  . INTRINSIC ASTHMA, UNSPECIFIED  . GASTRIC ULCER, ACUTE, HEMORRHAGE  . DYSPNEA  . CT, CHEST, ABNORMAL  . Stroke  . SVT (supraventricular tachycardia)  . Hypoxia  . Paroxysmal SVT (supraventricular tachycardia)  . S/P MVR (mitral valve replacement)  . S/P tricuspid valve repair  . S/P Maze operation for atrial fibrillation    Past Medical History:  Past Medical History  Diagnosis Date  . Pulmonary hypertension     s/p RHC 4/10 - PAP 86/29, wedge 21-23  . Gastric ulcer with hemorrhage   . Rheumatic fever     Childhood  . Coronary artery disease     Nonobstructive, LVEF 55%  . Mitral regurgitation     Moderate  . 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 Mitral stenosis with insufficiency, rheumatic   . Hypertension   . Heart murmur   . Stroke     12/11 [place],LEFT SIDE WEAKNESS  . Shortness of breath     EXERTION,PT. STATES SHE HAS PULMONARY HTN  . Pneumonia 1990'S     SEVERAL EPISODES  . Asthma     PT. STATES MILD,USES INHALERS PRN,  . Asthma     PT. ON OXYGEN 2 L/[place]   . Diabetes mellitus   . Hypothyroidism   . Anemia, pernicious 1979  . Hepatitis     PT. STATES SHE HAD IT WHEN SHE WAS 14 YRS. OLD  . Blood transfusion 2008  . Arthritis   . GERD (gastroesophageal reflux disease)   . Fibromyalgia   . Squamous cell carcinoma     Left arm  . Breast cancer     2008 s/p chemo and radiation (last tx 4/09) s/p left mastectomy s/p 9 lymph node resection - [name], [facility]  . CHF (congestive heart failure) 2010   Past Surgical History:  Past Surgical History  Procedure Date  . Left mastectomy   .  Partial gastrectomy   . Tonsillectomy   . Cardiac catheterization 07/2011  . Mastectomy 2008    LEFT    PT Assessment/Plan/Recommendation PT Assessment Clinical Impression Statement: pt is a 70 y/o female s/p mvr and tricuspid repair who would benefit from acute PT and HHPT after D/C for decreased activity tolerance PT Recommendation/Assessment: Patient will need skilled PT in the acute care venue PT Problem List: Decreased strength;Decreased activity tolerance;Decreased mobility;Decreased knowledge of precautions Barriers to Discharge: None PT Therapy Diagnosis : Acute pain;Other (comment) (decreased activity tolerance) PT Plan PT Frequency: Min 3X/week PT Treatment/Interventions: DME instruction;Gait training;Functional mobility training;Cognitive remediation;Balance training PT Recommendation Follow Up Recommendations: Home health PT Equipment Recommended: None recommended by PT PT Goals  Acute Rehab PT Goals PT Goal Formulation: With patient Time For Goal Achievement: 7 days Pt will go Supine/Side to Sit: with supervision PT Goal: Supine/Side to Sit - Progress: Other (comment) Pt will Transfer Sit to Stand/Stand to Sit: with supervision;without upper extremity assist PT Transfer Goal: Sit to Stand/Stand to Sit - Progress: Other (comment) Pt will Transfer Bed to Chair/Chair to Bed: with supervision PT Transfer Goal: Bed to Chair/Chair to Bed - Progress: Other (comment) Pt will Ambulate: 51 - 150 feet;with supervision;with least restrictive assistive device PT Goal: Ambulate -  Progress: Other (comment)  PT Evaluation Precautions/Restrictions  Precautions Precautions: Sternal Restrictions Weight Bearing Restrictions: No Prior Functioning  Home Living Lives With: Spouse Receives Help From: Family Type of Home: House Home Layout: One level;Full bath on main level;Able to live on main level with bedroom/bathroom Home Access: Level entry Bathroom Shower/Tub: Teacher, music: Standard Home Adaptive Equipment: Walker - rolling;Straight cane;Wheelchair - manual Prior Function Level of Independence: Independent with basic ADLs;Independent with homemaking with ambulation;Independent with gait;Independent with transfers Able to Take Stairs?: Yes Driving: Yes Cognition Cognition Arousal/Alertness: Awake/alert Overall Cognitive Status: Appears within functional limits for tasks assessed Orientation Level: Oriented X4 Sensation/Coordination Coordination Gross Motor Movements are Fluid and Coordinated: Yes Extremity Assessment RUE Assessment RUE Assessment: Within Functional Limits LUE Assessment LUE Assessment: Within Functional Limits RLE Assessment RLE Assessment: Within Functional Limits LLE Assessment LLE Assessment: Within Functional Limits Mobility (including Balance) Bed Mobility Bed Mobility: No Transfers Transfers: Yes Sit to Stand: 4: Min assist;From chair/3-in-1 Sit to Stand Details (indicate cue type and reason): vc's for hand position for sternal precautions Ambulation/Gait Ambulation/Gait: Yes Ambulation/Gait Assistance: Other (comment) (min guard A) Ambulation Distance (Feet): 18 Feet Assistive device: Other (Comment) (iv pole) Gait Pattern: Step-through pattern    Exercise    End of Session PT - End of Session Activity Tolerance: Patient limited by fatigue Patient left: in chair General Behavior During Session: [facility] for tasks performed Cognition: [facility] for tasks performed  [place] Labo, Eliseo Gum 10/01/2011, 5:22 PM  10/01/2011  [place] Bing, PT [phone] (pager)

## 2011-10-01 NOTE — Progress Notes (Addendum)
7 Days Post-Op Procedure(s) (LRB): TRICUSPID VALVE REPLACEMENT (N/A) MAZE (N/A) MITRAL VALVE (MV) REPLACEMENT (N/A)  Subjective: Pt went into afib this am, rates 110-120s.  C/O SOB and cough, mostly non-productive, but some brownish sputum.  Objective: Vital signs in last 24 hours: Patient Vitals for the past 24 hrs:  BP Temp Temp src Pulse Resp SpO2 Weight  10/01/11 0850 - - - - - 98 % -  10/01/11 0525 137/84 mmHg 98 F (36.7 C) Oral 98  20  93 % 172 lb 9.6 oz (78.291 kg)  09/30/11 2152 119/60 mmHg 99.3 F (37.4 C) Oral 96  20  95 % -  09/30/11 2018 - - - - - 93 % -  09/30/11 1640 128/74 mmHg 98.9 F (37.2 C) Oral 94  22  95 % -  09/30/11 1600 108/83 mmHg - - 88  19  100 % -  09/30/11 1500 119/58 mmHg 98.8 F (37.1 C) Oral 89  23  100 % -  09/30/11 1413 - - - - - 100 % -  09/30/11 1400 106/59 mmHg - - 90  18  100 % -  09/30/11 1300 117/64 mmHg - - 94  18  100 % -  09/30/11 1221 135/69 mmHg - - - - - -  09/30/11 1200 - - - 97  20  100 % -  09/30/11 1141 - 98.1 F (36.7 C) Oral - - - -  09/30/11 1100 - - - 99  21  100 % -  09/30/11 1011 - - - - - 100 % -   Current Weight  10/01/11 172 lb 9.6 oz (78.291 kg)     Intake/Output from previous day: 11/19 0701 - 11/20 0700 In: 920 [P.O.:320; I.V.:500; IV Piggyback:100] Out: 2450 [Urine:2450]    PHYSICAL EXAM:  Heart: irregularly irregular Lungs: coarse bilateral BS with faint crackles in the bases Wound: clean and dry Extremities: mild LE edema  Lab Results: CBC: Basename 10/01/11 0500 09/30/11 0349  WBC 14.4* 13.4*  HGB 8.8* 8.9*  HCT 27.7* 27.5*  PLT 151 131*   BMET:  Basename 10/01/11 0500 09/30/11 0349  NA 139 138  K 4.2 3.5  CL 101 101  CO2 30 28  GLUCOSE 168* 99  BUN 19 26*  CREATININE 0.82 1.00  CALCIUM 7.4* 7.2*    PT/INR:  Basename 10/01/11 0500  LABPROT 20.6*  INR 1.73*   Chest x-ray: Cardiomegaly with vascular congestion, basilar atelectasis and small effusions. No pneumothorax       Assessment/Plan: S/P Procedure(s) (LRB): TRICUSPID VALVE REPLACEMENT (N/A) MAZE (N/A) MITRAL VALVE (MV) REPLACEMENT (N/A)  CV- AF this am.  On telemetry while I was examining her, she slowed to 90s with a brief pause, but did not convert.  Orders written for an Amiodarone bolus and po Amio, but not given yet.  Will monitor rhythm and start Amio drip if needed.    Post-op acute blood loss anemia- stable.  Monitor.  Pulm- continue IS, breathing tx's, will add Mucinex and flutter valve.  Coumadin for MVR.  Vol overload- diuresing well.  Continue Lasix drip for now.  May be able to switch to po Lasix soon.  CRPI.   LOS: 7 days    COLLINS,GINA H 10/01/2011   I have seen and examined the patient and agree with the assessment and plan as outlined.  Now back in NSR after extra Amiodarone given.  Will increase lasix to 8mg /hr, increase coumadin 2 mg/daily. Increase Metoprolol 25 bid.  OWEN,CLARENCE  H

## 2011-10-01 NOTE — Progress Notes (Signed)
CARDIAC REHAB PHASE I   PRE:  Rate/Rhythm: 95 afib  BP:  Supine:   Sitting: 133/71  Standing:    SaO2: 100%2L  MODE:  Ambulation: 40 ft   POST:  Rate/Rhythem: 104 afib  BP:  Supine:   Sitting: 137/68  Standing:    SaO2: 98%2L  Pt walked 40 ft on O2 at 2L  With rolling walker and asst x 2. Pt very willing to walk but hindered by respiratory status. HR stable and only to 104 with activity. Pt stated could not go farther due to breathing. O2 sats good on 2L. Pt c/o feeling dizzy at end of walk. Encouraged Pursed-lip breathing.  To chair with call bell. [phone]  Duanne Limerick

## 2011-10-02 LAB — GLUCOSE, CAPILLARY
Glucose-Capillary: 92 mg/dL (ref 70–99)
Glucose-Capillary: 122 mg/dL — ABNORMAL HIGH (ref 70–99)

## 2011-10-02 LAB — PROTIME-INR
INR: 1.53 — ABNORMAL HIGH (ref 0.00–1.49)
Prothrombin Time: 18.7 seconds — ABNORMAL HIGH (ref 11.6–15.2)

## 2011-10-02 MED ORDER — INSULIN ASPART 100 UNIT/ML ~~LOC~~ SOLN
0.0000 [IU] | Freq: Two times a day (BID) | SUBCUTANEOUS | Status: DC
  Administered 2011-10-02 – 2011-10-13 (×6): 2 [IU] via SUBCUTANEOUS

## 2011-10-02 MED ORDER — METFORMIN HCL 500 MG PO TABS
500.0000 mg | ORAL_TABLET | Freq: Two times a day (BID) | ORAL | Status: DC
  Administered 2011-10-03 – 2011-10-09 (×13): 500 mg via ORAL
  Filled 2011-10-02 (×15): qty 1

## 2011-10-02 MED ORDER — WARFARIN VIDEO
Freq: Once | Status: AC
  Administered 2011-10-03: 1
  Filled 2011-10-02: qty 1

## 2011-10-02 MED ORDER — FUROSEMIDE 40 MG PO TABS
40.0000 mg | ORAL_TABLET | Freq: Two times a day (BID) | ORAL | Status: DC
  Administered 2011-10-02 – 2011-10-09 (×15): 40 mg via ORAL
  Filled 2011-10-02 (×17): qty 1

## 2011-10-02 MED ORDER — SODIUM CHLORIDE 0.9 % IJ SOLN
10.0000 mL | INTRAMUSCULAR | Status: DC | PRN
  Administered 2011-10-02 – 2011-10-14 (×33): 10 mL

## 2011-10-02 MED ORDER — LEVALBUTEROL HCL 0.63 MG/3ML IN NEBU
0.6300 mg | INHALATION_SOLUTION | Freq: Three times a day (TID) | RESPIRATORY_TRACT | Status: DC
  Administered 2011-10-02 – 2011-10-09 (×21): 0.63 mg via RESPIRATORY_TRACT
  Filled 2011-10-02 (×26): qty 3

## 2011-10-02 MED ORDER — COUMADIN BOOK
Freq: Once | Status: AC
  Administered 2011-10-02: 12:00:00
  Filled 2011-10-02: qty 1

## 2011-10-02 MED ORDER — SODIUM CHLORIDE 0.9 % IJ SOLN
10.0000 mL | Freq: Two times a day (BID) | INTRAMUSCULAR | Status: DC
  Administered 2011-10-02 – 2011-10-06 (×5): 10 mL

## 2011-10-02 MED ORDER — INSULIN ASPART 100 UNIT/ML ~~LOC~~ SOLN: 0.0000 [IU] | Freq: Three times a day (TID) | SUBCUTANEOUS | Status: DC

## 2011-10-02 NOTE — Progress Notes (Signed)
Cardiac Rehab 1440  Pt declined walk with [place]. States she had a bad night and breathing is bad. Really encouraged mobility but pt states too tired and dizzy. Merriel Zinger DunlapRN

## 2011-10-02 NOTE — Progress Notes (Addendum)
8 Days Post-Op Procedure(s) (LRB): TRICUSPID VALVE REPLACEMENT (N/A) MAZE (N/A) MITRAL VALVE (MV) REPLACEMENT (N/A)  Subjective: Patient with complaints of shortness of breath this am;bm 11/20.  Objective: Vital signs in last 24 hours: Patient Vitals for the past 24 hrs:  BP Temp Temp src Pulse Resp SpO2 Weight  10/02/11 0625 105/77 mmHg 97.9 F (36.6 C) Oral 107  20  94 % -  10/02/11 0100 - - - - - - 168 lb (76.204 kg)  10/01/11 1957 178/95 mmHg 98.8 F (37.1 C) Oral 115  20  96 % -  10/01/11 1411 140/68 mmHg - - 101  21  - -  10/01/11 1406 133/71 mmHg 99.4 F (37.4 C) Oral 85  21  98 % -  10/01/11 1301 - - - - - 100 % -  10/01/11 0850 - - - - - 98 % -   Pre op weight  73.9 kg Current Weight  10/02/11 168 lb (76.204 kg)       Intake/Output from previous day: 11/20 0701 - 11/21 0700 In: 810.3 [P.O.:240; I.V.:570.3] Out: 3350 [Urine:3350]   Physical Exam:  Cardiovascular:  Slightly tachycardic;no murmurs, gallops, or rubs. Pulmonary: Wheezing present ; no rales or rhonchi. Abdomen: Soft, non tender, bowel sounds present. Extremities: Mild bilateral lower extremity edema. Wounds: Clean and dry.  No erythema or signs of infection.  Lab Results: CBC: Basename 10/01/11 0500 09/30/11 0349  WBC 14.4* 13.4*  HGB 8.8* 8.9*  HCT 27.7* 27.5*  PLT 151 131*   BMET:  Basename 10/01/11 0500 09/30/11 0349  NA 139 138  K 4.2 3.5  CL 101 101  CO2 30 28  GLUCOSE 168* 99  BUN 19 26*  CREATININE 0.82 1.00  CALCIUM 7.4* 7.2*    PT/INR:  Basename 10/02/11 0600  LABPROT 18.7*  INR 1.53*   ABG:  INR: Will add last result for INR, ABG once components are confirmed Will add last 4 CBG results once components are confirmed  Assessment/Plan:  1. CV - Previous AFib with RVR and a pause on 11/20.  SR/ST  this am. Continue amiodarone 400 bid, Lopressor 50 bid, Coumadin 2 mg.  Monitor HR as may need to increase Lopressor. 2.  Pulmonary - Xopenex tid for wheezing. Wean O2 as  tolerates. Encourage incentive spirometer and flutter valve. 3. Volume Overload - Stop Lasix gttp and start Lasix 40 bid. 4.  Acute blood loss anemia - H/H stable at 8.8/27.7. 5.Remove EPW today. 6.DM-CBGs 122/120/92. Pre op HGA1C 5.9. Resume Metformin 500 mg q am and will start 500 q pm (on 1000 q pm pre op).   Ardelle Balls, PA 10/02/2011

## 2011-10-02 NOTE — Progress Notes (Signed)
Pt refused ambulation with PT and CR today. Stated that she had no sleep/rest last night and didn't feel well today and wasn't doing it.  She stated that she would walk tomorrow.  Ninetta Lights 10/02/2011 5:05 PM

## 2011-10-02 NOTE — Plan of Care (Signed)
Problem: Phase III Progression Outcomes Goal: Discharge plan remains appropriate-arrangements made Outcome: Progressing Plan to d/c with daughter in [place], possible this weekend

## 2011-10-02 NOTE — Plan of Care (Signed)
Problem: Phase III Progression Outcomes Goal: O2 Sat remains > or equal to 90% on room air Outcome: Not Applicable Date Met:  10/02/11 Patient wears O2 at home

## 2011-10-02 NOTE — Progress Notes (Signed)
DC EPW per protocol and as ordered, all ends intact, no ectopy noted. Resistant met bilaterally (left > right), Doree Fudge PA notified & removed wires. Bleeding from left side, pressure applied by PA. Pt tolerated well, reminded to lie supine approximately one hour. INR 1.53, NSR. Will continue to monitor.  Ninetta Lights 10/02/2011 12:46 PM

## 2011-10-02 NOTE — Progress Notes (Signed)
   CARE MANAGEMENT NOTE 10/02/2011  Patient:  [name],[name]   Account Number:  [id number]  Date Initiated:  09/26/2011  Documentation initiated by:  Avie Arenas  Subjective/Objective Assessment:   post op mini thoracotomy for valve surgeries on 09-24-11.  Lives at home with husband.-     Action/Plan:   Anticipated DC Date:  10/04/2011   Anticipated DC Plan:  HOME W HOME HEALTH SERVICES      DC Planning Services  CM consult      Choice offered to / List presented to:             Status of service:  In process, will continue to follow Medicare Important Message given?   (If response is "NO", the following Medicare IM given date fields will be blank) Date Medicare IM given:   Date Additional Medicare IM given:    Discharge Disposition:    Per UR Regulation:  Reviewed for med. necessity/level of care/duration of stay  Comments:  10/02/11 [name] Trombetta,RN,BSN 1445 MET WITH PT TO DISCUSS DC PLANS.  PTA, PT LIVES AT HOME WITH HUSBAND AND IS INDEPENDENT. SHE OWNS A WC, ROLLING WALKER, CANE AND HAS A TUB SEAT.  P.T. RECOMMENDING HHPT AT DISCHARGE.  MD, PLEASE ORDER PRIOR TO DC HOME, AND COMPLETE FACE TO FACE DOCUMENTATION. THANK YOU! Pager # [phone]  10-01-11 9am Avie Arenas, RNBSWN [phone] UR Completed.  09-26-11 2:35pm Avie Arenas, RNBSN [phone] UR Completed.

## 2011-10-02 NOTE — Progress Notes (Signed)
Patient's HR returned to 110s-120s this evening, still in AFib with BP 178/95.  Received orders from Dr. Cornelius Moras to increase dose of Metoprolol to 50mg  po BID and to increase frequency of Amio po to q8h.  Will continue to monitor.

## 2011-10-03 LAB — URINALYSIS, ROUTINE W REFLEX MICROSCOPIC
Glucose, UA: NEGATIVE mg/dL
Hgb urine dipstick: NEGATIVE
Ketones, ur: NEGATIVE mg/dL
Protein, ur: NEGATIVE mg/dL

## 2011-10-03 LAB — PROTIME-INR: INR: 1.41 (ref 0.00–1.49)

## 2011-10-03 LAB — CBC
HCT: 27.8 % — ABNORMAL LOW (ref 36.0–46.0)
MCHC: 31.3 g/dL (ref 30.0–36.0)
MCV: 90.6 fL (ref 78.0–100.0)
Platelets: 186 10*3/uL (ref 150–400)
RDW: 16.5 % — ABNORMAL HIGH (ref 11.5–15.5)

## 2011-10-03 MED ORDER — METOPROLOL TARTRATE 50 MG PO TABS
62.5000 mg | ORAL_TABLET | Freq: Two times a day (BID) | ORAL | Status: DC
  Administered 2011-10-03 – 2011-10-06 (×8): 62.5 mg via ORAL
  Filled 2011-10-03 (×11): qty 1

## 2011-10-03 NOTE — Progress Notes (Addendum)
9 Days Post-Op Procedure(s) (LRB): TRICUSPID VALVE REPLACEMENT (N/A) MAZE (N/A) MITRAL VALVE (MV) REPLACEMENT (N/A)  Subjective: Patient feeling better.  Objective: Vital signs in last 24 hours: Patient Vitals for the past 24 hrs:  BP Temp Temp src Pulse Resp SpO2 Weight  10/03/11 0538 136/77 mmHg 98.3 F (36.8 C) Oral 106  18  100 % 165 lb 3.2 oz (74.934 kg)  10/02/11 2028 170/89 mmHg 98.5 F (36.9 C) Oral 114  16  98 % -  10/02/11 1448 133/79 mmHg 97.9 F (36.6 C) Oral 105  16  100 % -  10/02/11 1436 - - - - - 97 % -  10/02/11 1010 145/128 mmHg - - 113  - - -  10/02/11 0943 - - - - - 95 % -   Pre op weight  73.9 kg Current Weight  10/03/11 165 lb 3.2 oz (74.934 kg)       Intake/Output from previous day: 11/21 0701 - 11/22 0700 In: 10 [I.V.:10] Out: 951 [Urine:950; Stool:1]   Physical Exam:  Cardiovascular:  Slightly tachycardic;no murmurs, gallops, or rubs. Pulmonary: Wheezing present but less so than yesterday ; no rales or rhonchi. Abdomen: Soft, non tender, bowel sounds present. Extremities: Trace bilateral lower extremity edema. Wound: Clean and dry.  No erythema or signs of infection.  Lab Results: CBC:  Basename 10/03/11 0600 10/01/11 0500  WBC 16.1* 14.4*  HGB 8.7* 8.8*  HCT 27.8* 27.7*  PLT 186 151   BMET:   Basename 10/01/11 0500  NA 139  K 4.2  CL 101  CO2 30  GLUCOSE 168*  BUN 19  CREATININE 0.82  CALCIUM 7.4*    PT/INR:   Basename 10/03/11 0600  LABPROT 17.5*  INR 1.41   ABG:  INR: Will add last result for INR, ABG once components are confirmed Will add last 4 CBG results once components are confirmed  Assessment/Plan:  1. CV - Previous AFib with RVR and a pause on 11/20.  SR/ST  this am. Continue amiodarone 400 bid, Coumadin 2 mg.  Will increase Lopressor to 62.5 mg bid for better HR control. 2.  Pulmonary - Xopenex tid for wheezing. Wean O2 as tolerates. Encourage incentive spirometer and flutter valve. 3. Volume Overload  - Continue Lasix 40 two times daily. May need to decrease to daily. 4.  Acute blood loss anemia - H/H stable at 8.7/27.8. 5.DM-CBGs 92/122/121. Pre op HGA1C 5.9. Resume Metformin 500 mg q am and will start 500 q pm (on 1000 q pm pre op). 6.Leukocytosis-WBC increased from 14.4 to 16.1. Remains afebrile, no signs of wound infection.  Check UA C and S and CXR.  Ardelle Balls, PA 10/03/2011   Chart reviewed and patient examined.  Agree with above. Her lungs sound fairly clear at this time but she complains of her breathing feeling tight.

## 2011-10-03 NOTE — Progress Notes (Signed)
Pt ambulated 147ft, 2L O@ [place], loud wheezing noted during walk. Pt c/o dizziness after 68ft and requested to return to room after resting in hallway. Pt sats remained 88-90% during ambulation on 2L O2 [place]. Pt returned to chair, will continue to monitor.  Ninetta Lights 10/03/2011 6:29 PM

## 2011-10-03 NOTE — Progress Notes (Signed)
Pt refused to walk this evening.  Also, she has been feeling dizzy at times when standing.  She is attributing this to her new medication dosages.  Will continue to monitor and encourage.  Idelle Crouch. Konrad Dolores, RN

## 2011-10-03 NOTE — Progress Notes (Signed)
Pt c/o of dizziness when standing. We attempted to ambulate in hallway but only made it outside of doorway before pt felt dizzy again. Pt returned to chair, 2L O2 maintained, BP WNL. Pt requested to try again this afternoon, she stated that she would try again later. Will continue to monitor.  [name] 10/03/2011 11:46 AM

## 2011-10-04 ENCOUNTER — Inpatient Hospital Stay (HOSPITAL_COMMUNITY): Payer: Medicare Other

## 2011-10-04 LAB — GLUCOSE, CAPILLARY
Glucose-Capillary: 102 mg/dL — ABNORMAL HIGH (ref 70–99)
Glucose-Capillary: 101 mg/dL — ABNORMAL HIGH (ref 70–99)
Glucose-Capillary: 101 mg/dL — ABNORMAL HIGH (ref 70–99)

## 2011-10-04 NOTE — Progress Notes (Addendum)
10 Days Post-Op  Procedure(s) (LRB): TRICUSPID VALVE REPLACEMENT (N/A) MAZE (N/A) MITRAL VALVE (MV) REPLACEMENT (N/A) Subjective: She feels as though she is making good progress. Having less shortness of breath. Ambulation is improving.   Objective  Telemetry SR/ST  Temp:  [98.4 F (36.9 C)-98.9 F (37.2 C)] 98.8 F (37.1 C) (11/23 0520) Pulse Rate:  [60-105] 60  (11/23 0520) Resp:  [18] 18  (11/23 0520) BP: (121-166)/(52-102) 121/52 mmHg (11/23 0520) SpO2:  [96 %-100 %] 96 % (11/23 0520) Weight:  [167 lb 1.7 oz (75.8 kg)] 167 lb 1.7 oz (75.8 kg) (11/23 4540)   Intake/Output Summary (Last 24 hours) at 10/04/11 0820 Last data filed at 10/04/11 9811  Gross per 24 hour  Intake    540 ml  Output   1500 ml  Net   -960 ml       General appearance: alert, cooperative and no distress Heart: RRR, soft systolic murmur Lungs: scattered wheezes Abdomen: minor distension, soft, non tender Extremities: 1+ edema Wound: incision healing well without signs of infection.  Lab Results: No results found for this basename: NA:2,K:2,CL:2,CO2:2,GLUCOSE:2,BUN:2,CREATININE:2,CALCIUM:2,MG:2,PHOS:2 in the last 72 hours No results found for this basename: AST:2,ALT:2,ALKPHOS:2,BILITOT:2,PROT:2,ALBUMIN:2 in the last 72 hours No results found for this basename: LIPASE:2,AMYLASE:2 in the last 72 hours  Basename 10/03/11 0600  WBC 16.1*  NEUTROABS --  HGB 8.7*  HCT 27.8*  MCV 90.6  PLT 186   No results found for this basename: CKTOTAL:4,CKMB:4,TROPONINI:4 in the last 72 hours No results found for this basename: POCBNP:3 in the last 72 hours No results found for this basename: DDIMER in the last 72 hours No results found for this basename: HGBA1C in the last 72 hours No results found for this basename: CHOL,HDL,LDLCALC,TRIG,CHOLHDL in the last 72 hours No results found for this basename: TSH,T4TOTAL,FREET3,T3FREE,THYROIDAB in the last 72 hours No results found for this basename:  VITAMINB12,FOLATE,FERRITIN,TIBC,IRON,RETICCTPCT in the last 72 hours  Medications: Scheduled    . amiodarone  400 mg Oral Q8H  . amitriptyline  150 mg Oral QHS  . bisacodyl  10 mg Oral Daily   Or  . bisacodyl  10 mg Rectal Daily  . chlorhexidine  15 mL Mouth/Throat BID  . cyanocobalamin  1,000 mcg Intramuscular Q30 days  . docusate sodium  200 mg Oral Daily  . folic acid  2 mg Oral Daily  . furosemide  40 mg Oral BID  . guaiFENesin  600 mg Oral BID  . insulin aspart  0-24 Units Subcutaneous BID WC  . letrozole  2.5 mg Oral Daily  . levalbuterol  0.63 mg Nebulization TID  . levothyroxine  137 mcg Oral QAC breakfast  . metFORMIN  500 mg Oral BID WC  . metoprolol tartrate  62.5 mg Oral BID  . pantoprazole  40 mg Oral Q1200  . potassium chloride  20 mEq Oral BID  . povidone-iodine  1 application Topical BID  . rosuvastatin  10 mg Oral q1800  . sodium chloride  10 mL Intracatheter Q12H  . sodium chloride  3 mL Intravenous Q12H  . warfarin  2 mg Oral q1800  . warfarin   Does not apply Once  . DISCONTD: metoprolol tartrate  50 mg Oral BID     Radiology/Studies:  Dg Chest 2 View  10/04/2011  *RADIOLOGY REPORT*  Clinical Data: Status post mitral and tricuspid valve replacement. Shortness of breath  CHEST - 2 VIEW  Comparison: 10/01/2011  Findings: A right peripheral central venous catheter is stable in position. Heart  size is mildly enlarged and stable.  There has been an interval decrease in pulmonary vascular congestion and bilateral pleural effusions.  Persistent bibasilar subsegmental atelectasis is seen in the lung fields are otherwise clear with no new focal infiltrates or signs of congestive failure evident.  IMPRESSION: Decreasing pulmonary vascular congestion, bilateral effusions and bibasilar subsegmental atelectasis.  Original Report Authenticated By: Bertha Stakes, M.D.    INR: Will add last result for INR, ABG once components are confirmed Will add last 4 CBG  results once components are confirmed  Assessment/Plan: S/P Procedure(s) (LRB): TRICUSPID VALVE REPLACEMENT (N/A) MAZE (N/A) MITRAL VALVE (MV) REPLACEMENT (N/A) 1. Improving pulmonary status, cont bid lasix, xopenex, pulm toilet. Will check BNP, weam O2 2. UA is unremarkable, will f/u wbc count 3. CBG well controlled. 4. F/U H/H for abl anemia 5.cont ac rx, INR 1.36 6. Push rehab as able.  LOS: 10 days    [name],[name] 11/23/20128:20 AM   Chart reviewed, patient examined, agree with above.

## 2011-10-04 NOTE — Progress Notes (Signed)
CARDIAC REHAB PHASE I   PRE:  Rate/Rhythm: 66  BP:  Supine:  Sitting: 151/66  Standing:   SaO2: 100 2L  MODE:  Ambulation: 150 ft   POST:  Rate/Rhythem: 76  BP:  Supine:  Sitting: 135/58  Standing:    SaO2: 100 2L  [name]  Pt ambulated 150 ft assist x 2 with rolling walker and oxygen tank on 2L. Tolerated well, although pt took 3 short rest breaks and complained of being "lightheaded" and "dizzy" at beginning. Symptoms subsided with rest breaks. Returned to [employer]. Vital signs stable. Encouraged incentive spirometer usage. Call bell in reach. Will Follow up. Harriett Sine [facility] 10/04/11 1191

## 2011-10-04 NOTE — Progress Notes (Signed)
Physical Therapy Treatment Patient Details Name: Gerilynn Mccullars MRN: [id number] DOB: [birth year] Today's Date: 10/04/2011  PT Assessment/Plan  PT - Assessment/Plan Comments on Treatment Session: Patient is s/p TVR and MVR with decreased endurance for activity.  Needs cues for safety with RW and to address balance and endurance issues.   PT Plan: Discharge plan remains appropriate;Frequency remains appropriate PT Frequency: Min 3X/week Follow Up Recommendations: Home health PT Equipment Recommended: None recommended by PT PT Goals  Acute Rehab PT Goals PT Goal Formulation: With patient Time For Goal Achievement: 7 days PT Goal: Supine/Side to Sit - Progress: Other (comment) PT Transfer Goal: Sit to Stand/Stand to Sit - Progress: Partly met PT Transfer Goal: Bed to Chair/Chair to Bed - Progress: Other (comment) PT Goal: Ambulate - Progress: Progressing toward goal  PT Treatment Precautions/Restrictions  Precautions Precautions: Fall;Sternal Required Braces or Orthoses: No Restrictions Weight Bearing Restrictions: No Mobility (including Balance) Bed Mobility Bed Mobility: No Transfers Sit to Stand: 5: Supervision;With upper extremity assist;With armrests;From chair/3-in-1 Sit to Stand Details (indicate cue type and reason): pt. stood without warning however did not use UEs much - used LEs more for sit to stand from recliner Ambulation/Gait Ambulation/Gait Assistance:  (min guard assist for safety) Ambulation/Gait Assistance Details (indicate cue type and reason): Pt. needed cues to stay close to RW throughout ambulation.  Kept posture flexed but feel this is patient's baseline posture. Ambulation Distance (Feet): 180 Feet Assistive device: Rolling walker Gait Pattern: Step-through pattern;Decreased stride length;Trunk flexed;Antalgic (Kyphotic thoracic spine) Stairs: No Wheelchair Mobility Wheelchair Mobility: No  Posture/Postural Control Posture/Postural Control: No significant  limitations Balance Balance Assessed: No Exercise    End of Session PT - End of Session Equipment Utilized During Treatment: Gait belt Activity Tolerance: Patient limited by fatigue Patient left: in chair;with call bell in reach Nurse Communication: Mobility status for ambulation General Behavior During Session: [facility] for tasks performed Cognition: [facility] for tasks performed  INGOLD,Sabriel Borromeo 10/04/2011, 12:07 PM [facility] Acute Rehabilitation [phone] (pager)

## 2011-10-05 LAB — PROTIME-INR
INR: 1.37 (ref 0.00–1.49)
Prothrombin Time: 17.1 seconds — ABNORMAL HIGH (ref 11.6–15.2)

## 2011-10-05 LAB — GLUCOSE, CAPILLARY: Glucose-Capillary: 95 mg/dL (ref 70–99)

## 2011-10-05 MED ORDER — WARFARIN SODIUM 5 MG PO TABS
5.0000 mg | ORAL_TABLET | Freq: Once | ORAL | Status: AC
  Administered 2011-10-05: 5 mg via ORAL
  Filled 2011-10-05: qty 1

## 2011-10-05 NOTE — Progress Notes (Addendum)
11 Days Post-Op  Procedure(s) (LRB): TRICUSPID VALVE REPLACEMENT (N/A) MAZE (N/A) MITRAL VALVE (MV) REPLACEMENT (N/A) Subjective: Cont's to feel better. Less SOB, coughed up allot of sputum yesterday. Slowly cont's to get stronger.  Objective  Telemetry NSR  Temp:  [98.2 F (36.8 C)-98.3 F (36.8 C)] 98.2 F (36.8 C) (11/24 0425) Pulse Rate:  [65-94] 65  (11/24 0425) Resp:  [16-19] 16  (11/24 0425) BP: (120-138)/(53-81) 138/81 mmHg (11/24 0425) SpO2:  [94 %-99 %] 99 % (11/24 0425) Weight:  [170 lb 6.7 oz (77.3 kg)] 170 lb 6.7 oz (77.3 kg) (11/24 0425)   Intake/Output Summary (Last 24 hours) at 10/05/11 0935 Last data filed at 10/05/11 0425  Gross per 24 hour  Intake    733 ml  Output   1850 ml  Net  -1117 ml       General appearance: alert, cooperative and no distress Heart: rrr, soft systolic murmur Lungs: mildly diminished in the bases, no wheeze currently Abdomen: soft, non-tender; bowel sounds normal; no masses,  no organomegaly Extremities: 2+ edema Wound: incisions healing well without signs of infection.  Lab Results: No results found for this basename: NA:2,K:2,CL:2,CO2:2,GLUCOSE:2,BUN:2,CREATININE:2,CALCIUM:2,MG:2,PHOS:2 in the last 72 hours No results found for this basename: AST:2,ALT:2,ALKPHOS:2,BILITOT:2,PROT:2,ALBUMIN:2 in the last 72 hours No results found for this basename: LIPASE:2,AMYLASE:2 in the last 72 hours  Basename 10/03/11 0600  WBC 16.1*  NEUTROABS --  HGB 8.7*  HCT 27.8*  MCV 90.6  PLT 186   No results found for this basename: CKTOTAL:4,CKMB:4,TROPONINI:4 in the last 72 hours No results found for this basename: POCBNP:3 in the last 72 hours No results found for this basename: DDIMER in the last 72 hours No results found for this basename: HGBA1C in the last 72 hours No results found for this basename: CHOL,HDL,LDLCALC,TRIG,CHOLHDL in the last 72 hours No results found for this basename: TSH,T4TOTAL,FREET3,T3FREE,THYROIDAB in the  last 72 hours No results found for this basename: VITAMINB12,FOLATE,FERRITIN,TIBC,IRON,RETICCTPCT in the last 72 hours  Medications: Scheduled    . amiodarone  400 mg Oral Q8H  . amitriptyline  150 mg Oral QHS  . bisacodyl  10 mg Oral Daily   Or  . bisacodyl  10 mg Rectal Daily  . chlorhexidine  15 mL Mouth/Throat BID  . cyanocobalamin  1,000 mcg Intramuscular Q30 days  . docusate sodium  200 mg Oral Daily  . folic acid  2 mg Oral Daily  . furosemide  40 mg Oral BID  . guaiFENesin  600 mg Oral BID  . insulin aspart  0-24 Units Subcutaneous BID WC  . letrozole  2.5 mg Oral Daily  . levalbuterol  0.63 mg Nebulization TID  . levothyroxine  137 mcg Oral QAC breakfast  . metFORMIN  500 mg Oral BID WC  . metoprolol tartrate  62.5 mg Oral BID  . pantoprazole  40 mg Oral Q1200  . potassium chloride  20 mEq Oral BID  . povidone-iodine  1 application Topical BID  . rosuvastatin  10 mg Oral q1800  . sodium chloride  10 mL Intracatheter Q12H  . sodium chloride  3 mL Intravenous Q12H  . warfarin  2 mg Oral q1800     Radiology/Studies:  Dg Chest 2 View  10/04/2011  *RADIOLOGY REPORT*  Clinical Data: Status post mitral and tricuspid valve replacement. Shortness of breath  CHEST - 2 VIEW  Comparison: 10/01/2011  Findings: A right peripheral central venous catheter is stable in position. Heart size is mildly enlarged and stable.  There has been  an interval decrease in pulmonary vascular congestion and bilateral pleural effusions.  Persistent bibasilar subsegmental atelectasis is seen in the lung fields are otherwise clear with no new focal infiltrates or signs of congestive failure evident.  IMPRESSION: Decreasing pulmonary vascular congestion, bilateral effusions and bibasilar subsegmental atelectasis.  Original Report Authenticated By: Bertha Stakes, M.D.    INR: Will add last result for INR, ABG once components are confirmed Will add last 4 CBG results once components are  confirmed  Assessment/Plan: S/P Procedure(s) (LRB): TRICUSPID VALVE REPLACEMENT (N/A) MAZE (N/A) MITRAL VALVE (MV) REPLACEMENT (N/A) 1. Improving pulmonary status, cont bid lasix, xopenex, pulm toilet. Will check BNP, weam O2  May be able to decrease lasix soon.  3. CBG well controlled.  4. F/U H/H for abl anemia  5.cont ac rx, INR 1.37 , may need to increase dose soon, if doesn't bump 6. Push rehab as able.   LOS: 11 days    [name],[name] 11/24/20129:35 AM    Chart reviewed, patient examined, agree with above.  Will give 5mg  of coumadin today.

## 2011-10-05 NOTE — Progress Notes (Signed)
CARDIAC REHAB PHASE I   PRE:  Rate/Rhythm: 62 SR  BP:  Supine:   Sitting: 129/65  Standing:    SaO2: 100 2L  MODE:  Ambulation:  150 ft   POST:  Rate/Rhythem: 73 SR  BP:  Supine:   Sitting: 118/59  Standing:    SaO2: 98 2L  [name]  Pt ambulated 150 ft assist x 2 with rolling walker and 2L O2. Tolerated well, complained of being tired. Did not want to increase ambulation distance from yesterday. Took three rest breaks. Returned to recliner, vital signs stable. Call bell in reach. Will follow up. Harriett Sine [facility] 10/05/11 1112

## 2011-10-06 LAB — URINALYSIS, ROUTINE W REFLEX MICROSCOPIC
Glucose, UA: NEGATIVE mg/dL
Hgb urine dipstick: NEGATIVE
Leukocytes, UA: NEGATIVE
Protein, ur: NEGATIVE mg/dL
Specific Gravity, Urine: 1.009 (ref 1.005–1.030)

## 2011-10-06 LAB — BASIC METABOLIC PANEL
BUN: 19 mg/dL (ref 6–23)
CO2: 33 mEq/L — ABNORMAL HIGH (ref 19–32)
Calcium: 9.4 mg/dL (ref 8.4–10.5)
Glucose, Bld: 117 mg/dL — ABNORMAL HIGH (ref 70–99)
Potassium: 3.7 mEq/L (ref 3.5–5.1)
Sodium: 134 mEq/L — ABNORMAL LOW (ref 135–145)

## 2011-10-06 LAB — CBC
Hemoglobin: 8.8 g/dL — ABNORMAL LOW (ref 12.0–15.0)
MCH: 28.9 pg (ref 26.0–34.0)
MCHC: 31.7 g/dL (ref 30.0–36.0)
MCV: 91.1 fL (ref 78.0–100.0)
RBC: 3.05 MIL/uL — ABNORMAL LOW (ref 3.87–5.11)

## 2011-10-06 LAB — GLUCOSE, CAPILLARY
Glucose-Capillary: 135 mg/dL — ABNORMAL HIGH (ref 70–99)
Glucose-Capillary: 127 mg/dL — ABNORMAL HIGH (ref 70–99)
Glucose-Capillary: 114 mg/dL — ABNORMAL HIGH (ref 70–99)

## 2011-10-06 MED ORDER — WARFARIN SODIUM 5 MG PO TABS
5.0000 mg | ORAL_TABLET | Freq: Once | ORAL | Status: AC
  Administered 2011-10-06: 5 mg via ORAL
  Filled 2011-10-06: qty 1

## 2011-10-06 MED ORDER — AMIODARONE HCL 200 MG PO TABS
400.0000 mg | ORAL_TABLET | Freq: Two times a day (BID) | ORAL | Status: DC
  Administered 2011-10-06: 400 mg via ORAL
  Filled 2011-10-06 (×3): qty 2

## 2011-10-06 NOTE — Progress Notes (Addendum)
12 Days Post-Op  Procedure(s) (LRB): TRICUSPID VALVE REPLACEMENT (N/A) MAZE (N/A) MITRAL VALVE (MV) REPLACEMENT (N/A) Subjective: She feels better today. No new complaints.Does cont to tire fairly easily.  Objective  TelemetrySR with 1degree AVB  Temp:  [98 F (36.7 C)-98.4 F (36.9 C)] 98 F (36.7 C) (11/25 0444) Pulse Rate:  [63-68] 64  (11/25 0444) Resp:  [18] 18  (11/25 0444) BP: (136-159)/(66-80) 143/80 mmHg (11/25 0444) SpO2:  [92 %-99 %] 99 % (11/25 0801) Weight:  [164 lb 0.4 oz (74.4 kg)] 164 lb 0.4 oz (74.4 kg) (11/25 0444)   Intake/Output Summary (Last 24 hours) at 10/06/11 0908 Last data filed at 10/06/11 0500  Gross per 24 hour  Intake    480 ml  Output   1550 ml  Net  -1070 ml       General appearance: alert, cooperative and no distress Heart: regular rate and rhythm and S1, S2 normal Lungs: mildly diminished in the bases Abdomen: positive BS, moderate distension Extremities: edema 2+ bilat LE Wound: incisions healing well without signs of infection  Lab Results:  Basename 10/06/11 0630  NA 134*  K 3.7  CL 91*  CO2 33*  GLUCOSE 117*  BUN 19  CREATININE 1.17*  CALCIUM 9.4  MG --  PHOS --   No results found for this basename: AST:2,ALT:2,ALKPHOS:2,BILITOT:2,PROT:2,ALBUMIN:2 in the last 72 hours No results found for this basename: LIPASE:2,AMYLASE:2 in the last 72 hours  Basename 10/06/11 0630  WBC 25.7*  NEUTROABS --  HGB 8.8*  HCT 27.8*  MCV 91.1  PLT 187   No results found for this basename: CKTOTAL:4,CKMB:4,TROPONINI:4 in the last 72 hours  Basename 10/05/11 2143  POCBNP 2221.0*   No results found for this basename: DDIMER in the last 72 hours No results found for this basename: HGBA1C in the last 72 hours No results found for this basename: CHOL,HDL,LDLCALC,TRIG,CHOLHDL in the last 72 hours No results found for this basename: TSH,T4TOTAL,FREET3,T3FREE,THYROIDAB in the last 72 hours No results found for this basename:  VITAMINB12,FOLATE,FERRITIN,TIBC,IRON,RETICCTPCT in the last 72 hours  Medications: Scheduled    . amiodarone  400 mg Oral Q8H  . amitriptyline  150 mg Oral QHS  . bisacodyl  10 mg Oral Daily   Or  . bisacodyl  10 mg Rectal Daily  . chlorhexidine  15 mL Mouth/Throat BID  . cyanocobalamin  1,000 mcg Intramuscular Q30 days  . docusate sodium  200 mg Oral Daily  . folic acid  2 mg Oral Daily  . furosemide  40 mg Oral BID  . guaiFENesin  600 mg Oral BID  . insulin aspart  0-24 Units Subcutaneous BID WC  . letrozole  2.5 mg Oral Daily  . levalbuterol  0.63 mg Nebulization TID  . levothyroxine  137 mcg Oral QAC breakfast  . metFORMIN  500 mg Oral BID WC  . metoprolol tartrate  62.5 mg Oral BID  . pantoprazole  40 mg Oral Q1200  . potassium chloride  20 mEq Oral BID  . povidone-iodine  1 application Topical BID  . rosuvastatin  10 mg Oral q1800  . sodium chloride  10 mL Intracatheter Q12H  . sodium chloride  3 mL Intravenous Q12H  . warfarin  5 mg Oral ONCE-1800  . DISCONTD: warfarin  2 mg Oral q1800     Radiology/Studies:  No results found.  INR: Will add last result for INR, ABG once components are confirmed Will add last 4 CBG results once components are confirmed  Assessment/Plan: S/P  Procedure(s) (LRB): TRICUSPID VALVE REPLACEMENT (N/A) MAZE (N/A) MITRAL VALVE (MV) REPLACEMENT (N/A) 1.BNP still>2000, will cont BID lasix 2.decrease amiodarone to 400 BID, check qtc in am, check LFT's 3. Increased leukocytosis, uncertain etiology, afebrile, follow clinically but may need cultures, recheck cxr to evaluate for infiltrates 4. CBG well controlled    LOS: 12 days    GOLD,WAYNE E 11/25/20129:08 AM    Chart reviewed, patient examined, agree with above. She feels better every day WBC elevated to 25 but no fever.  Incision ok, lungs sound good. Will check UA/UC.  She has right arm PICC which could be source but site looks ok. INR/Prothrombin Time 1.37

## 2011-10-07 ENCOUNTER — Inpatient Hospital Stay (HOSPITAL_COMMUNITY): Payer: Medicare Other

## 2011-10-07 ENCOUNTER — Encounter (HOSPITAL_COMMUNITY): Payer: Self-pay | Admitting: Thoracic Surgery (Cardiothoracic Vascular Surgery)

## 2011-10-07 DIAGNOSIS — I369 Nonrheumatic tricuspid valve disorder, unspecified: Secondary | ICD-10-CM

## 2011-10-07 LAB — DIFFERENTIAL
Eosinophils Relative: 1 % (ref 0–5)
Lymphocytes Relative: 11 % — ABNORMAL LOW (ref 12–46)
Lymphs Abs: 2.1 10*3/uL (ref 0.7–4.0)
Monocytes Absolute: 1.1 10*3/uL — ABNORMAL HIGH (ref 0.1–1.0)
Monocytes Relative: 6 % (ref 3–12)

## 2011-10-07 LAB — CBC
HCT: 26.9 % — ABNORMAL LOW (ref 36.0–46.0)
MCHC: 31.6 g/dL (ref 30.0–36.0)
Platelets: 178 10*3/uL (ref 150–400)
RDW: 16.2 % — ABNORMAL HIGH (ref 11.5–15.5)

## 2011-10-07 LAB — HEPATIC FUNCTION PANEL
ALT: 12 U/L (ref 0–35)
Alkaline Phosphatase: 75 U/L (ref 39–117)
Bilirubin, Direct: 0.1 mg/dL (ref 0.0–0.3)
Indirect Bilirubin: 0.2 mg/dL — ABNORMAL LOW (ref 0.3–0.9)
Total Bilirubin: 0.3 mg/dL (ref 0.3–1.2)

## 2011-10-07 LAB — BASIC METABOLIC PANEL
BUN: 19 mg/dL (ref 6–23)
GFR calc Af Amer: 53 mL/min — ABNORMAL LOW (ref >90)
GFR calc non Af Amer: 46 mL/min — ABNORMAL LOW (ref >90)
Potassium: 3.6 mEq/L (ref 3.5–5.1)
Sodium: 137 mEq/L (ref 135–145)

## 2011-10-07 LAB — GLUCOSE, CAPILLARY: Glucose-Capillary: 119 mg/dL — ABNORMAL HIGH (ref 70–99)

## 2011-10-07 MED ORDER — WARFARIN SODIUM 2.5 MG PO TABS
2.5000 mg | ORAL_TABLET | Freq: Every day | ORAL | Status: DC
  Administered 2011-10-07 – 2011-10-08 (×2): 2.5 mg via ORAL
  Filled 2011-10-07 (×3): qty 1

## 2011-10-07 MED ORDER — METOPROLOL TARTRATE 50 MG PO TABS
50.0000 mg | ORAL_TABLET | Freq: Two times a day (BID) | ORAL | Status: DC
Start: 2011-10-07 — End: 2011-10-15
  Administered 2011-10-07 – 2011-10-15 (×12): 50 mg via ORAL
  Filled 2011-10-07 (×18): qty 1

## 2011-10-07 MED ORDER — AMIODARONE HCL 200 MG PO TABS
200.0000 mg | ORAL_TABLET | Freq: Two times a day (BID) | ORAL | Status: DC
  Administered 2011-10-07 – 2011-10-15 (×17): 200 mg via ORAL
  Filled 2011-10-07 (×18): qty 1

## 2011-10-07 MED ORDER — POTASSIUM CHLORIDE CRYS ER 20 MEQ PO TBCR
40.0000 meq | EXTENDED_RELEASE_TABLET | Freq: Once | ORAL | Status: AC
  Administered 2011-10-07: 40 meq via ORAL

## 2011-10-07 NOTE — Progress Notes (Signed)
   CARDIOTHORACIC SURGERY PROGRESS NOTE  13 Days Post-Op  S/P Procedure(s) (LRB): TRICUSPID VALVE REPLACEMENT (N/A) MAZE (N/A) MITRAL VALVE (MV) REPLACEMENT (N/A)  Subjective: Feels well.  Improving slowly.   Objective: Vital signs in last 24 hours: Temp:  [98 F (36.7 C)-99.1 F (37.3 C)] 99 F (37.2 C) (11/26 0607) Pulse Rate:  [64-74] 64  (11/26 0607) Cardiac Rhythm:  [-] Heart block (11/26 0821) Resp:  [18-20] 20  (11/26 0607) BP: (128-148)/(65-79) 128/65 mmHg (11/26 0607) SpO2:  [97 %-100 %] 98 % (11/26 0904) Weight:  [73.936 kg (163 lb)] 163 lb (73.936 kg) (11/26 0400)  Physical Exam:  Rhythm:   sinus  Breath sounds: clear  Heart sounds:  RRR + systolic murmur  Incisions:  Healing well  Abdomen:  Soft, non-tender  Extremities:  Warm, mild edema   Intake/Output from previous day: 11/25 0701 - 11/26 0700 In: 600 [P.O.:600] Out: 1775 [Urine:1775] Intake/Output this shift:    Lab Results:  Basename 10/07/11 0500 10/06/11 0630  WBC 19.9* 25.7*  HGB 8.5* 8.8*  HCT 26.9* 27.8*  PLT 178 187   BMET:  Basename 10/07/11 0500 10/06/11 0630  NA 137 134*  K 3.6 3.7  CL 92* 91*  CO2 35* 33*  GLUCOSE 110* 117*  BUN 19 19  CREATININE 1.19* 1.17*  CALCIUM 9.9 9.4    CBG (last 3)   Basename 10/07/11 0605 10/06/11 2136 10/06/11 1628  GLUCAP 119* 114* 127*   PT/INR:   Basename 10/07/11 0500  LABPROT 21.6*  INR 1.84*    CXR:  stable  Assessment/Plan: S/P Procedure(s) (LRB): TRICUSPID VALVE REPLACEMENT (N/A) MAZE (N/A) MITRAL VALVE (MV) REPLACEMENT (N/A)  Overall making progress.  WBC decreased some today. No sign of infection.  Will monitor.  Check follow up ECHO.  Possible d/c with home health later this week.  Coumadin 2.5 today. Decrease amiodarone 200 bid.  [name] H 10/07/2011

## 2011-10-07 NOTE — Progress Notes (Signed)
CARDIAC REHAB PHASE I   PRE:  Rate/Rhythm: 65  BP:  Supine:   Sitting: 148/84  Standing:    SaO2: 98% 2L  MODE:  Ambulation: 150 ft   POST:  Rate/Rhythem: 69  BP:  Supine:   Sitting: 157/74  Standing:    SaO2: 99% 2L  0925 - 0955 Pt ambulated 150 ft with walker steady. Stopped twice to "catch" her breath. Enc to walk further, pt refused. Back to chair with feet up and call bell in reach.     Rosalie Doctor

## 2011-10-07 NOTE — Progress Notes (Signed)
Pt encourage to walk. Pt refused to walk today. Back to bed with call bell in reach

## 2011-10-07 NOTE — Progress Notes (Signed)
  Echocardiogram 2D Echocardiogram has been performed.  Annette Bertelson Nira Retort 10/07/2011, 12:27 PM

## 2011-10-08 LAB — BASIC METABOLIC PANEL
CO2: 34 mEq/L — ABNORMAL HIGH (ref 19–32)
GFR calc non Af Amer: 43 mL/min — ABNORMAL LOW (ref >90)
Glucose, Bld: 112 mg/dL — ABNORMAL HIGH (ref 70–99)
Potassium: 3.4 mEq/L — ABNORMAL LOW (ref 3.5–5.1)
Sodium: 136 mEq/L (ref 135–145)

## 2011-10-08 LAB — URINE CULTURE
Colony Count: 15000
Culture  Setup Time: 201211260416

## 2011-10-08 LAB — PROTIME-INR
INR: 2.35 — ABNORMAL HIGH (ref 0.00–1.49)
Prothrombin Time: 26.1 seconds — ABNORMAL HIGH (ref 11.6–15.2)

## 2011-10-08 LAB — GLUCOSE, CAPILLARY
Glucose-Capillary: 100 mg/dL — ABNORMAL HIGH (ref 70–99)
Glucose-Capillary: 115 mg/dL — ABNORMAL HIGH (ref 70–99)

## 2011-10-08 LAB — CBC
Hemoglobin: 8.5 g/dL — ABNORMAL LOW (ref 12.0–15.0)
RBC: 2.94 MIL/uL — ABNORMAL LOW (ref 3.87–5.11)
WBC: 20.2 10*3/uL — ABNORMAL HIGH (ref 4.0–10.5)

## 2011-10-08 MED ORDER — OLMESARTAN MEDOXOMIL 20 MG PO TABS
20.0000 mg | ORAL_TABLET | Freq: Every day | ORAL | Status: DC
  Administered 2011-10-08: 20 mg via ORAL
  Filled 2011-10-08 (×2): qty 1

## 2011-10-08 MED ORDER — POTASSIUM CHLORIDE CRYS ER 20 MEQ PO TBCR
40.0000 meq | EXTENDED_RELEASE_TABLET | Freq: Two times a day (BID) | ORAL | Status: DC
  Administered 2011-10-08 (×2): 40 meq via ORAL
  Filled 2011-10-08 (×3): qty 2

## 2011-10-08 NOTE — Progress Notes (Signed)
CARDIAC REHAB PHASE I   PRE:  Rate/Rhythm: 65Sr  BP:  Supine:   Sitting: 129/68  Standing:    SaO2: 98%2L  MODE:  Ambulation: 166 ft   POST:  Rate/Rhythem: 76  BP:  Supine:   Sitting: 119/48  Standing:    SaO2: 98%2L  Pt walked 166 ft on O2 at 2L with rolling walker and asst x 1.  Stopped once to rest. Tolerated well. Tried to increase distance. To chair . [phone]  Duanne Limerick

## 2011-10-08 NOTE — Progress Notes (Signed)
   CARDIOTHORACIC SURGERY PROGRESS NOTE  14 Days Post-Op  S/P Procedure(s) (LRB): TRICUSPID VALVE REPLACEMENT (TVR) (N/A) MAZE (N/A) MITRAL VALVE (MV) REPLACEMENT (N/A)  Subjective: Reports feeling well and looks well  Objective: Vital signs in last 24 hours: Temp:  [97.5 F (36.4 C)-98.7 F (37.1 C)] 97.5 F (36.4 C) (11/27 0520) Pulse Rate:  [68-89] 69  (11/27 0520) Cardiac Rhythm:  [-] Heart block (11/26 1950) Resp:  [18-20] 18  (11/27 0520) BP: (112-155)/(61-84) 145/61 mmHg (11/27 0520) SpO2:  [94 %-99 %] 99 % (11/27 0520) FiO2 (%):  [28 %] 28 % (11/26 1623) Weight:  [73 kg (160 lb 15 oz)] 160 lb 15 oz (73 kg) (11/27 0520)  Physical Exam:  Rhythm:   sinus  Breath sounds: Diminished at bases  Heart sounds:  RRR  Incisions:  Healing nicely  Abdomen:  soft  Extremities:  Warm, mild edema L>R   Intake/Output from previous day: 11/26 0701 - 11/27 0700 In: 850 [P.O.:850] Out: 300 [Urine:300] Intake/Output this shift:    Lab Results:  Basename 10/08/11 0500 10/07/11 0500  WBC 20.2* 19.9*  HGB 8.5* 8.5*  HCT 26.8* 26.9*  PLT 172 178   BMET:  Basename 10/08/11 0500 10/07/11 0500  NA 136 137  K 3.4* 3.6  CL 92* 92*  CO2 34* 35*  GLUCOSE 112* 110*  BUN 23 19  CREATININE 1.25* 1.19*  CALCIUM 9.6 9.9    CBG (last 3)   Basename 10/08/11 0638 10/07/11 2157 10/07/11 1612  GLUCAP 100* 110* 95   PT/INR:   Basename 10/08/11 0500  LABPROT 26.1*  INR 2.35*    CXR:  N/A  Assessment/Plan: S/P Procedure(s) (LRB): TRICUSPID VALVE REPLACEMENT (TVR) (N/A) MAZE (N/A) MITRAL VALVE (MV) REPLACEMENT (N/A)  Overall stable and improving.  Leukocytosis persists but no sign of infection.  Will restart Diovan.  OWEN,CLARENCE H 10/08/2011

## 2011-10-08 NOTE — Progress Notes (Signed)
Physical Therapy Treatment Patient Details Name: [name] MRN: [id number] DOB: 1941-09-02 Today's Date: 10/08/2011  PT Assessment/Plan  PT - Assessment/Plan PT Plan: Discharge plan remains appropriate Follow Up Recommendations: Home health PT Equipment Recommended:  None per PT PT Goals  Acute Rehab PT Goals PT Goal: Supine/Side to Sit - Progress:  (not addressed today) PT Transfer Goal: Sit to Stand/Stand to Sit - Progress: Met (need to work on consistency of maintaining sternal prec.) PT Transfer Goal: Bed to Chair/Chair to Bed - Progress: Met PT Goal: Ambulate - Progress: Met (will work on consistency)  PT Treatment Precautions/Restrictions  Precautions Precautions: Sternal;Fall Required Braces or Orthoses: No Restrictions Weight Bearing Restrictions: No Mobility (including Balance) Transfers Transfers: Yes Sit to Stand: 5: Supervision;From chair/3-in-1 Sit to Stand Details (indicate cue type and reason): vc to maintain her sternal precautions Ambulation/Gait Ambulation/Gait Assistance: 5: Supervision Ambulation/Gait Assistance Details (indicate cue type and reason): vc's for cervical/ postural checks; cuing to build/increase her gait speed progressiviely Ambulation Distance (Feet): 400 Feet Assistive device: Rolling walker Gait Pattern: Within Functional Limits;Decreased stride length Stairs: No    Exercise    End of Session PT - End of Session Activity Tolerance: Patient tolerated treatment well Patient left: in chair;with call bell in reach General Behavior During Session: [facility] for tasks performed Cognition: [facility] for tasks performed  Deklin Bieler, Eliseo Gum 10/08/2011, 4:25 PM  10/08/2011  Murray Bing, PT [phone] (pager)

## 2011-10-08 NOTE — Progress Notes (Signed)
Pt refused to walk at this time,pt is tired as claimed.

## 2011-10-09 LAB — CBC
HCT: 26.4 % — ABNORMAL LOW (ref 36.0–46.0)
Hemoglobin: 8.2 g/dL — ABNORMAL LOW (ref 12.0–15.0)
MCV: 91 fL (ref 78.0–100.0)
RDW: 16.3 % — ABNORMAL HIGH (ref 11.5–15.5)
WBC: 16.4 10*3/uL — ABNORMAL HIGH (ref 4.0–10.5)

## 2011-10-09 LAB — BASIC METABOLIC PANEL
BUN: 29 mg/dL — ABNORMAL HIGH (ref 6–23)
CO2: 33 mEq/L — ABNORMAL HIGH (ref 19–32)
Chloride: 92 mEq/L — ABNORMAL LOW (ref 96–112)
Creatinine, Ser: 1.51 mg/dL — ABNORMAL HIGH (ref 0.50–1.10)
GFR calc Af Amer: 40 mL/min — ABNORMAL LOW (ref >90)
Glucose, Bld: 103 mg/dL — ABNORMAL HIGH (ref 70–99)

## 2011-10-09 LAB — GLUCOSE, CAPILLARY: Glucose-Capillary: 98 mg/dL (ref 70–99)

## 2011-10-09 MED ORDER — POTASSIUM CHLORIDE CRYS ER 20 MEQ PO TBCR
20.0000 meq | EXTENDED_RELEASE_TABLET | Freq: Every day | ORAL | Status: DC
  Administered 2011-10-09: 20 meq via ORAL
  Filled 2011-10-09: qty 1

## 2011-10-09 MED ORDER — FUROSEMIDE 40 MG PO TABS
40.0000 mg | ORAL_TABLET | Freq: Every day | ORAL | Status: DC
  Administered 2011-10-09: 40 mg via ORAL
  Filled 2011-10-09 (×2): qty 1

## 2011-10-09 MED ORDER — WARFARIN SODIUM 2 MG PO TABS
2.0000 mg | ORAL_TABLET | Freq: Every day | ORAL | Status: DC
  Administered 2011-10-09: 2 mg via ORAL
  Filled 2011-10-09 (×2): qty 1

## 2011-10-09 MED ORDER — POTASSIUM CHLORIDE 10 MEQ PO TBCR
20.0000 meq | EXTENDED_RELEASE_TABLET | Freq: Once | ORAL | Status: AC
  Administered 2011-10-09: 20 meq via ORAL
  Filled 2011-10-09: qty 2

## 2011-10-09 NOTE — Progress Notes (Signed)
UR Completed.  Morley Gaumer Jane [phone] 10/09/2011  

## 2011-10-09 NOTE — Progress Notes (Signed)
PHARMACIST - PHYSICIAN COMMUNICATION DR:  Cornelius Moras CONCERNING:  METFORMIN SAFE ADMINISTRATION POLICY  RECOMMENDATION: Metformin has been placed on DISCONTINUE (rejected order) STATUS and should be reordered only after any of the conditions below are ruled out.  Current safety recommendations include avoiding metformin for a minimum of 48 hours after the patient's exposure to intravenous contrast media.  DESCRIPTION:  The Pharmacy Committee has adopted a policy that restricts the use of metformin in hospitalized patients until all the contraindications to administration have been ruled out. Specific contraindications are: []  Serum creatinine ? 1.5 for males [x]  Serum creatinine ? 1.4 for females []  Shock, acute MI, sepsis, hypoxemia, dehydration []  Planned administration of intravenous iodinated contrast media []  Heart Failure patients with low EF []  Acute or chronic metabolic acidosis (including DKA)      As the patient's renal function improves--consider restarting Metformin.  Thanks, Georgina Pillion, PharmD  10/09/2011 1:42 PM

## 2011-10-09 NOTE — Progress Notes (Addendum)
15 Days Post-Op Procedure(s) (LRB): TRICUSPID VALVE REPLACEMENT (TVR) (N/A) MAZE (N/A) MITRAL VALVE (MV) REPLACEMENT (N/A)  Subjective: Patient feeling better. Hopes to go home soon.  Objective: Vital signs in last 24 hours: Patient Vitals for the past 24 hrs:  BP Temp Temp src Pulse Resp SpO2 Weight  10/09/11 0850 - - - - - 99 % -  10/09/11 0557 125/79 mmHg 97.7 F (36.5 C) Oral 79  12  99 % -  10/09/11 0548 - - - - - - 160 lb 15 oz (73 kg)  10/08/11 2102 124/78 mmHg 97.8 F (36.6 C) Oral 61  18  94 % -  10/08/11 2048 - - - - - 98 % -  10/08/11 1535 - - - - - 98 % -  10/08/11 1320 165/80 mmHg 98.4 F (36.9 C) Oral 64  20  100 % -   Pre op weight  73.9 kg Current Weight  10/09/11 160 lb 15 oz (73 kg)       Intake/Output from previous day: 11/27 0701 - 11/28 0700 In: 120 [P.O.:120] Out: 1150 [Urine:1150]   Physical Exam:  Cardiovascular: RRR, no murmurs, gallops, or rubs. Pulmonary: Diminished at the bases; no rales, wheezes, or rhonchi. Abdomen: Soft, non tender, distended,bowel sounds present. Extremities: Mild bilateral lower extremity edema. Wounds: Clean and dry.  No erythema or signs of infection.  Lab Results: CBC: Basename 10/09/11 0509 10/08/11 0500  WBC 16.4* 20.2*  HGB 8.2* 8.5*  HCT 26.4* 26.8*  PLT 160 172   BMET:  Basename 10/09/11 0509 10/08/11 0500  NA 136 136  K 3.4* 3.4*  CL 92* 92*  CO2 33* 34*  GLUCOSE 103* 112*  BUN 29* 23  CREATININE 1.51* 1.25*  CALCIUM 9.2 9.6    PT/INR:  Basename 10/09/11 0509  LABPROT 30.7*  INR 2.89*   ABG:  INR: Will add last result for INR, ABG once components are confirmed Will add last 4 CBG results once components are confirmed  Assessment/Plan:  1. CV - SR. Continue amiodarone 200 bid, Lopressor 50 bid. Was started on Benicar 20 11/27 as well.  INR increased from 2.35 to 2.89.  Will give Coumadin 2 tonight. 2.  Pulmonary - Encourage incentive spirometer. 3. Volume Overload - Has had good  diuresis.  4.  Acute blood loss anemia - H/H 8.2/26.4. 5.Leukocytosis-WBC decreased from 20.2 to 16.4. Remains afebrile, no sign of wound infection, no GU complaints, and blood cultures are pending (has PICC). 6.Supplement KCL. 7.Cr increased from 1.25 to 1.5. With BUN slightly increased so will decrease Lasix to daily and monitor.Check BMET in am. 8.DM-CBGs 110/99.Continue current medications.  Ardelle Balls, PA 10/09/2011   I have seen and examined the patient and agree with the assessment and plan as outlined.  Will hold ACE-I as well because of increased creatinine.  Treyvion Durkee H

## 2011-10-09 NOTE — Progress Notes (Signed)
Discussed in the long length of stay meeting Tawyna Pellot Weeks 10/09/2011  

## 2011-10-09 NOTE — Progress Notes (Signed)
Physical Therapy Treatment Patient Details Name: [name] MRN: [id number] DOB: March 10, 1941 Today's Date: 10/09/2011  PT Assessment/Plan  PT - Assessment/Plan Comments on Treatment Session: Ready to D/C home from mobility standpoint PT Plan: Discharge plan remains appropriate Follow Up Recommendations: Home health PT Equipment Recommended: None recommended by PT PT Goals  Acute Rehab PT Goals PT Goal: Supine/Side to Sit - Progress: Progressing toward goal Pt will Transfer Sit to Stand/Stand to Sit: with supervision PT Transfer Goal: Sit to Stand/Stand to Sit - Progress: Met PT Transfer Goal: Bed to Chair/Chair to Bed - Progress: Met PT Goal: Ambulate - Progress: Met  PT Treatment Precautions/Restrictions  Precautions Precautions: Fall Required Braces or Orthoses: No Restrictions Weight Bearing Restrictions: No Mobility (including Balance) Bed Mobility Bed Mobility: Yes Supine to Sit: 7: Independent Sit to Supine - Left: 7: Independent Transfers Sit to Stand: 5: Supervision Sit to Stand Details (indicate cue type and reason): verbally reinforced sternal precautions that she was doing it correctly Ambulation/Gait Ambulation/Gait Assistance: 5: Supervision Ambulation Distance (Feet): 240 Feet Assistive device: Rolling walker Gait Pattern: Step-through pattern (generally steady, but slow and unable to change cadence)  Posture/Postural Control Posture/Postural Control:  (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

## 2011-10-10 LAB — GLUCOSE, CAPILLARY
Glucose-Capillary: 65 mg/dL — ABNORMAL LOW (ref 70–99)
Glucose-Capillary: 107 mg/dL — ABNORMAL HIGH (ref 70–99)
Glucose-Capillary: 103 mg/dL — ABNORMAL HIGH (ref 70–99)

## 2011-10-10 LAB — BASIC METABOLIC PANEL
BUN: 36 mg/dL — ABNORMAL HIGH (ref 6–23)
Calcium: 9.2 mg/dL (ref 8.4–10.5)
Creatinine, Ser: 2.02 mg/dL — ABNORMAL HIGH (ref 0.50–1.10)
GFR calc Af Amer: 28 mL/min — ABNORMAL LOW (ref >90)
GFR calc non Af Amer: 24 mL/min — ABNORMAL LOW (ref >90)

## 2011-10-10 LAB — CBC
MCHC: 31.2 g/dL (ref 30.0–36.0)
Platelets: 124 10*3/uL — ABNORMAL LOW (ref 150–400)
RDW: 16.5 % — ABNORMAL HIGH (ref 11.5–15.5)
WBC: 15.2 10*3/uL — ABNORMAL HIGH (ref 4.0–10.5)

## 2011-10-10 LAB — PROTIME-INR
INR: 3.11 — ABNORMAL HIGH (ref 0.00–1.49)
Prothrombin Time: 32.5 seconds — ABNORMAL HIGH (ref 11.6–15.2)

## 2011-10-10 LAB — PRO B NATRIURETIC PEPTIDE: Pro B Natriuretic peptide (BNP): 2437 pg/mL — ABNORMAL HIGH (ref 0–125)

## 2011-10-10 MED ORDER — FOLIC ACID 1 MG PO TABS: 2.0000 mg | ORAL_TABLET | Freq: Every day | ORAL | Status: AC

## 2011-10-10 MED ORDER — CYANOCOBALAMIN 1000 MCG/ML IJ SOLN: 1000.0000 ug | INTRAMUSCULAR | Status: AC

## 2011-10-10 MED ORDER — WARFARIN SODIUM 2 MG PO TABS: 2.0000 mg | ORAL_TABLET | Freq: Every day | ORAL | Status: DC

## 2011-10-10 MED ORDER — HYDROCODONE-ACETAMINOPHEN 7.5-650 MG PO TABS: 1.0000 | ORAL_TABLET | Freq: Four times a day (QID) | ORAL | Status: DC | PRN

## 2011-10-10 MED ORDER — AMIODARONE HCL 200 MG PO TABS: 200.0000 mg | ORAL_TABLET | Freq: Two times a day (BID) | ORAL | Status: DC

## 2011-10-10 MED ORDER — LETROZOLE 2.5 MG PO TABS: 2.5000 mg | ORAL_TABLET | Freq: Every day | ORAL | Status: DC

## 2011-10-10 MED ORDER — CENTRUM SILVER PO TABS: 1.0000 | ORAL_TABLET | Freq: Every day | ORAL | Status: AC

## 2011-10-10 MED ORDER — LEVOTHYROXINE SODIUM 137 MCG PO TABS: 137.0000 ug | ORAL_TABLET | Freq: Every day | ORAL | Status: DC

## 2011-10-10 MED ORDER — ROSUVASTATIN CALCIUM 10 MG PO TABS: 10.0000 mg | ORAL_TABLET | Freq: Every day | ORAL | Status: DC

## 2011-10-10 MED ORDER — PANTOPRAZOLE SODIUM 40 MG PO TBEC: 40.0000 mg | DELAYED_RELEASE_TABLET | Freq: Every day | ORAL | Status: DC

## 2011-10-10 MED ORDER — METOPROLOL TARTRATE 50 MG PO TABS: 50.0000 mg | ORAL_TABLET | Freq: Two times a day (BID) | ORAL | Status: DC

## 2011-10-10 MED ORDER — LEVALBUTEROL HCL 0.63 MG/3ML IN NEBU
0.6300 mg | INHALATION_SOLUTION | Freq: Four times a day (QID) | RESPIRATORY_TRACT | Status: DC | PRN
  Filled 2011-10-10: qty 3

## 2011-10-10 MED ORDER — WARFARIN SODIUM 2 MG PO TABS
2.0000 mg | ORAL_TABLET | Freq: Every day | ORAL | Status: DC
  Filled 2011-10-10: qty 1

## 2011-10-10 MED ORDER — AMITRIPTYLINE HCL 150 MG PO TABS: 150.0000 mg | ORAL_TABLET | Freq: Every day | ORAL | Status: DC

## 2011-10-10 MED ORDER — FUROSEMIDE 40 MG PO TABS: 40.0000 mg | ORAL_TABLET | Freq: Every day | ORAL | Status: DC

## 2011-10-10 NOTE — Progress Notes (Signed)
CARDIAC REHAB PHASE I   PRE:  Rate/Rhythm: 68 1st degree    BP: sitting 111/65    SaO2: 95 2L  MODE:  Ambulation: 350 ft   POST:  Rate/Rhythm: 83 1st degree    BP: sitting 97/79     SaO2: 92 RA  Pt ambulated 350 ft assist x1 with RW on RA.  Multiple rest stops due to fatigue and SOB, which is pts norm. SaO2 hard to register, used warm pack on hands and was 91-93 RA on several occasions. SaO2 did drop to 82 RA when walking but quickly back up to 92 RA when she stopped moving so therefore do not feel this was accurate.  Pt tolerated walk well. Return to recliner, left on RA and notified RN.   [phone] Harriet Masson CES, ACSM

## 2011-10-10 NOTE — Progress Notes (Signed)
Pt refused her walk tonight. Stated her "neck and back hurt too much to walk". Pt refused pain medication at this time. Told to call when she needed pain medicine. Will continue to monitor. Kalman Drape

## 2011-10-10 NOTE — Progress Notes (Addendum)
16 Days Post-Op  Procedure(s) (LRB): TRICUSPID VALVE REPLACEMENT (TVR) (N/A) MAZE (N/A) MITRAL VALVE (MV) REPLACEMENT (N/A) Subjective: She feels better, slept well. No new complaints.  Objective  Telemetry NSR 1 degree AVB  Temp:  [97.9 F (36.6 C)-99 F (37.2 C)] 97.9 F (36.6 C) (11/29 0508) Pulse Rate:  [67-75] 68  (11/29 0508) Resp:  [18] 18  (11/29 0508) BP: (104-141)/(44-77) 104/59 mmHg (11/29 0508) SpO2:  [93 %-99 %] 93 % (11/29 0508) Weight:  [162 lb 4.8 oz (73.619 kg)] 162 lb 4.8 oz (73.619 kg) (11/29 0508)   Intake/Output Summary (Last 24 hours) at 10/10/11 0738 Last data filed at 10/10/11 0500  Gross per 24 hour  Intake    960 ml  Output    500 ml  Net    460 ml       General appearance: alert and no distress Heart: 2/6 syst m, RRR Lungs: crackles in the L base Abdomen: soft, -TTP Extremities: trace edema incisions- healing without signs of infection  Lab Results:  [facility] 10/10/11 0622 10/09/11 0509  NA 135 136  K 4.4 3.4*  CL 94* 92*  CO2 29 33*  GLUCOSE 92 103*  BUN 36* 29*  CREATININE 2.02* 1.51*  CALCIUM 9.2 9.2  MG -- --  PHOS -- --   No results found for this basename: AST:2,ALT:2,ALKPHOS:2,BILITOT:2,PROT:2,ALBUMIN:2 in the last 72 hours No results found for this basename: LIPASE:2,AMYLASE:2 in the last 72 hours  Basename 10/10/11 0622 10/09/11 0509  WBC 15.2* 16.4*  NEUTROABS -- --  HGB 8.4* 8.2*  HCT 26.9* 26.4*  MCV 91.8 91.0  PLT 124* 160   No results found for this basename: CKTOTAL:4,CKMB:4,TROPONINI:4 in the last 72 hours No results found for this basename: POCBNP:3 in the last 72 hours No results found for this basename: DDIMER in the last 72 hours No results found for this basename: HGBA1C in the last 72 hours No results found for this basename: CHOL,HDL,LDLCALC,TRIG,CHOLHDL in the last 72 hours No results found for this basename: TSH,T4TOTAL,FREET3,T3FREE,THYROIDAB in the last 72 hours No results found for this  basename: VITAMINB12,FOLATE,FERRITIN,TIBC,IRON,RETICCTPCT in the last 72 hours  Medications: Scheduled    . amiodarone  200 mg Oral BID  . amitriptyline  150 mg Oral QHS  . bisacodyl  10 mg Oral Daily   Or  . bisacodyl  10 mg Rectal Daily  . chlorhexidine  15 mL Mouth/Throat BID  . cyanocobalamin  1,000 mcg Intramuscular Q30 days  . docusate sodium  200 mg Oral Daily  . folic acid  2 mg Oral Daily  . furosemide  40 mg Oral Daily  . insulin aspart  0-24 Units Subcutaneous BID WC  . letrozole  2.5 mg Oral Daily  . levothyroxine  137 mcg Oral QAC breakfast  . metoprolol tartrate  50 mg Oral BID  . pantoprazole  40 mg Oral Q1200  . potassium chloride  20 mEq Oral Once  . potassium chloride  20 mEq Oral Daily  . povidone-iodine  1 application Topical BID  . rosuvastatin  10 mg Oral q1800  . warfarin  2 mg Oral q1800  . DISCONTD: furosemide  40 mg Oral BID  . DISCONTD: levalbuterol  0.63 mg Nebulization TID  . DISCONTD: metFORMIN  500 mg Oral BID WC  . DISCONTD: olmesartan  20 mg Oral Daily  . DISCONTD: potassium chloride  40 mEq Oral BID  . DISCONTD: sodium chloride  10 mL Intracatheter Q12H  . DISCONTD: sodium chloride  3 mL  Intravenous Q12H  . DISCONTD: warfarin  2.5 mg Oral q1800     Radiology/Studies:  No results found.  INR: Will add last result for INR, ABG once components are confirmed Will add last 4 CBG results once components are confirmed  Assessment/Plan: S/P Procedure(s) (LRB): TRICUSPID VALVE REPLACEMENT (TVR) (N/A) MAZE (N/A) MITRAL VALVE (MV) REPLACEMENT (N/A) 1. Creat increased to 2.1, ace stopped yesterday, hold lasix, check BNP 2.wean O2 off, currently 2 liters [place] 3. Leukocytosis cont to improve. 4. H/H stable 5. Sugars somewhat low only on SSI Addendum : INR 3.1, will hold coumadin today.   BNP 2437.0  LOS: 16 days    GOLD,WAYNE E 11/29/20127:38 AM   I have seen and examined the patient and agree with the assessment and plan as outlined.   Hold lasix completely for now and watch renal function.  She is at or slightly below baseline preop weight.  OWEN,CLARENCE H 5:36 PM

## 2011-10-10 NOTE — Progress Notes (Signed)
CBG: 65 at 0548  Treatment: 15 GM carbohydrate snack  Symptoms: None  Follow-up CBG: Time:0615 CBG Result:70  Possible Reasons for Event: Unknown  Comments/MD notified: no symptoms. Pt responded well to treatment    Kalman Drape

## 2011-10-11 LAB — BASIC METABOLIC PANEL
BUN: 40 mg/dL — ABNORMAL HIGH (ref 6–23)
CO2: 29 mEq/L (ref 19–32)
Calcium: 8.8 mg/dL (ref 8.4–10.5)
Chloride: 95 mEq/L — ABNORMAL LOW (ref 96–112)
Creatinine, Ser: 1.82 mg/dL — ABNORMAL HIGH (ref 0.50–1.10)
GFR calc Af Amer: 32 mL/min — ABNORMAL LOW (ref >90)

## 2011-10-11 LAB — GLUCOSE, CAPILLARY
Glucose-Capillary: 100 mg/dL — ABNORMAL HIGH (ref 70–99)
Glucose-Capillary: 98 mg/dL (ref 70–99)

## 2011-10-11 LAB — PROTIME-INR
INR: 3.56 — ABNORMAL HIGH (ref 0.00–1.49)
Prothrombin Time: 36.1 seconds — ABNORMAL HIGH (ref 11.6–15.2)

## 2011-10-11 MED ORDER — ALTEPLASE 2 MG IJ SOLR
2.0000 mg | Freq: Once | INTRAMUSCULAR | Status: AC
  Administered 2011-10-11: 2 mg
  Filled 2011-10-11: qty 2

## 2011-10-11 MED ORDER — ENSURE CLINICAL ST REVIGOR PO LIQD
237.0000 mL | Freq: Three times a day (TID) | ORAL | Status: DC
  Administered 2011-10-11 – 2011-10-12 (×2): 237 mL via ORAL
  Administered 2011-10-12: 12:00:00 via ORAL
  Administered 2011-10-13 – 2011-10-15 (×2): 237 mL via ORAL

## 2011-10-11 NOTE — Progress Notes (Addendum)
17 Days Post-Op  Procedure(s) (LRB): TRICUSPID VALVE REPLACEMENT (TVR) (N/A) MAZE (N/A) MITRAL VALVE (MV) REPLACEMENT (N/A) Subjective: Feels better, got some rest. No new complaints  Objective  Telemetry NSR w/1AVB  Temp:  [97.8 F (36.6 C)-98.7 F (37.1 C)] 97.8 F (36.6 C) (11/30 1610) Pulse Rate:  [71-74] 71  (11/30 0608) Resp:  [14-18] 14  (11/30 9604) BP: (106-119)/(69-76) 113/69 mmHg (11/30 0608) SpO2:  [88 %-99 %] 99 % (11/30 0608) Weight:  [162 lb 7.7 oz (73.7 kg)] 162 lb 7.7 oz (73.7 kg) (11/30 5409)   Intake/Output Summary (Last 24 hours) at 10/11/11 0731 Last data filed at 10/10/11 1700  Gross per 24 hour  Intake    960 ml  Output      0 ml  Net    960 ml       General appearance: alert and no distress Heart: RRR, 2/6 syst murmur Lungs: minor crackles in the bases Abdomen: soft, non-tender; bowel sounds normal; no masses,  no organomegaly Extremities: minor edema Wound: incisions healing well without signs of infection  Lab Results:  Basename 10/11/11 0540 10/10/11 0622  NA 137 135  K 3.8 4.4  CL 95* 94*  CO2 29 29  GLUCOSE 95 92  BUN 40* 36*  CREATININE 1.82* 2.02*  CALCIUM 8.8 9.2  MG -- --  PHOS -- --   No results found for this basename: AST:2,ALT:2,ALKPHOS:2,BILITOT:2,PROT:2,ALBUMIN:2 in the last 72 hours No results found for this basename: LIPASE:2,AMYLASE:2 in the last 72 hours  Basename 10/10/11 0622 10/09/11 0509  WBC 15.2* 16.4*  NEUTROABS -- --  HGB 8.4* 8.2*  HCT 26.9* 26.4*  MCV 91.8 91.0  PLT 124* 160   No results found for this basename: CKTOTAL:4,CKMB:4,TROPONINI:4 in the last 72 hours  Basename 10/10/11 1105  POCBNP 2437.0*   No results found for this basename: DDIMER in the last 72 hours No results found for this basename: HGBA1C in the last 72 hours No results found for this basename: CHOL,HDL,LDLCALC,TRIG,CHOLHDL in the last 72 hours No results found for this basename: TSH,T4TOTAL,FREET3,T3FREE,THYROIDAB in the  last 72 hours No results found for this basename: VITAMINB12,FOLATE,FERRITIN,TIBC,IRON,RETICCTPCT in the last 72 hours  Medications: Scheduled    . amiodarone  200 mg Oral BID  . amitriptyline  150 mg Oral QHS  . bisacodyl  10 mg Oral Daily   Or  . bisacodyl  10 mg Rectal Daily  . chlorhexidine  15 mL Mouth/Throat BID  . cyanocobalamin  1,000 mcg Intramuscular Q30 days  . docusate sodium  200 mg Oral Daily  . folic acid  2 mg Oral Daily  . insulin aspart  0-24 Units Subcutaneous BID WC  . letrozole  2.5 mg Oral Daily  . levothyroxine  137 mcg Oral QAC breakfast  . metoprolol tartrate  50 mg Oral BID  . pantoprazole  40 mg Oral Q1200  . povidone-iodine  1 application Topical BID  . rosuvastatin  10 mg Oral q1800  . warfarin  2 mg Oral q1800  . DISCONTD: furosemide  40 mg Oral Daily  . DISCONTD: potassium chloride  20 mEq Oral Daily  . DISCONTD: warfarin  2 mg Oral q1800     Radiology/Studies:  No results found.  INR: Will add last result for INR, ABG once components are confirmed Will add last 4 CBG results once components are confirmed  Assessment/Plan: S/P Procedure(s) (LRB): TRICUSPID VALVE REPLACEMENT (TVR) (N/A) MAZE (N/A) MITRAL VALVE (MV) REPLACEMENT (N/A) 1. Creat improved, cont to hold lasix  and ACEI 2. May need home O2 3. INR 3.56 hold coumadin for now. Will need to reorder in am if INR acceptable to continue CBG well controlled Range 68-106 for lasr 24 hours  LOS: 17 days    GOLD,WAYNE E 11/30/20127:31 AM   I have seen and examined the patient and agree with the assessment and plan as outlined.  Mrs Seldon was on oxygen at home prior to surgery.  OWEN,CLARENCE H  6:01 PM

## 2011-10-11 NOTE — Progress Notes (Signed)
CARDIAC REHAB PHASE I   PRE:  Rate/Rhythm: 65 first degree    BP: sitting 111/65, 86/26 after a few steps and sat    SaO2: 95 2L, 95 RA  MODE:  Ambulation: 150 ft   POST:  Rate/Rhythm: 73 first degree    BP: sitting 61/36, up to 111/82 after 3 min sitting     SaO2: 95 RA  Pt c/o wooziness upon standing and walking a few feet. Sat on EOB and BP in leg was 86/26.  Sat for a few min, then stood still for a few min. Felt better. However during walk pt continued to feel woozy and "washed out".  Offered chair several times but pt refused. She did want to decrease distance of walk. On return to room BP in recliner was 61/36. After 3 min it was 111/82.  Pt felt better. Gave Diet Coke. Left off O2 as SaO2 was good.  [id number]  [name] CES, ACSM

## 2011-10-11 NOTE — Progress Notes (Signed)
Pt refused walk stating she wanted to sleep.  Oxygen level check since pt has required 1L of oxygen at night the past two nights I have taken care of her due to SaO2 levels between 87-90%. Oxygen level tonight reached 94%with encouragement of deep breaths, but anticipate that decreasing when pt goes to sleep. Pt placed on 1L of oxygen. Will continue to monitor. No further pt complains. Kalman Drape, RN

## 2011-10-11 NOTE — Progress Notes (Signed)
Physical Therapy Treatment Patient Details Name: [name] MRN: [id number] DOB: 10-27-1941 Today's Date: 10/11/2011  PT Assessment/Plan  PT - Assessment/Plan Comments on Treatment Session: BP changes (down to 78/53) MAP 59 limited what we could do today PT Plan: Discharge plan remains appropriate Follow Up Recommendations: Home health PT Equipment Recommended: None recommended by PT PT Goals  Acute Rehab PT Goals PT Goal: Supine/Side to Sit - Progress: Other (comment) (not addressed today) PT Transfer Goal: Bed to Chair/Chair to Bed - Progress: Progressing toward goal PT Goal: Ambulate - Progress: Progressing toward goal  PT Treatment Precautions/Restrictions  Precautions Precautions: Fall Required Braces or Orthoses: No Restrictions Weight Bearing Restrictions: No Mobility (including Balance) Bed Mobility Bed Mobility: No Transfers Sit to Stand: 5: Supervision Sit to Stand Details (indicate cue type and reason):  (no cuing needed) Ambulation/Gait Ambulation/Gait: Yes Ambulation/Gait Assistance: Other (comment) (min guard A) Ambulation/Gait Assistance Details (indicate cue type and reason): pt became lightheaded and faint and necessitated getting back to the chair Ambulation Distance (Feet): 10 Feet (then 77' after she recovered, but decr BP's necessitated bac) Assistive device: Rolling walker Gait Pattern: Within Functional Limits    Exercise    End of Session PT - End of Session Activity Tolerance:  (limited by decr BP) Patient left: in chair Nurse Communication: Other (comment) (BP changes) General Behavior During Session: [facility] for tasks performed Cognition: [facility] for tasks performed  [name], Eliseo Gum 10/11/2011, 4:24 PM  10/11/2011  [place] Bing, PT [phone] (pager)

## 2011-10-11 NOTE — Progress Notes (Signed)
PT last bowel movement was 10/03/11. Pt refused laxative but accepted prune juice. Abd soft, non tender, hypoactive bowel sounds, and passing gas. Pt stated it is normal for her to have a bowel movement every 5days. Will offer laxative in the morning. Told to call. Will continue to monitor. Kalman Drape, RN

## 2011-10-12 LAB — BASIC METABOLIC PANEL
BUN: 41 mg/dL — ABNORMAL HIGH (ref 6–23)
Creatinine, Ser: 1.76 mg/dL — ABNORMAL HIGH (ref 0.50–1.10)
GFR calc Af Amer: 33 mL/min — ABNORMAL LOW (ref >90)
GFR calc non Af Amer: 29 mL/min — ABNORMAL LOW (ref >90)
Glucose, Bld: 105 mg/dL — ABNORMAL HIGH (ref 70–99)

## 2011-10-12 LAB — CBC
HCT: 26.7 % — ABNORMAL LOW (ref 36.0–46.0)
Hemoglobin: 8.4 g/dL — ABNORMAL LOW (ref 12.0–15.0)
MCHC: 31.5 g/dL (ref 30.0–36.0)
MCV: 89.6 fL (ref 78.0–100.0)
RDW: 16.3 % — ABNORMAL HIGH (ref 11.5–15.5)

## 2011-10-12 LAB — GLUCOSE, CAPILLARY
Glucose-Capillary: 106 mg/dL — ABNORMAL HIGH (ref 70–99)
Glucose-Capillary: 115 mg/dL — ABNORMAL HIGH (ref 70–99)

## 2011-10-12 MED ORDER — METFORMIN HCL 500 MG PO TABS
500.0000 mg | ORAL_TABLET | Freq: Two times a day (BID) | ORAL | Status: DC
  Administered 2011-10-12: 500 mg via ORAL
  Filled 2011-10-12 (×3): qty 1

## 2011-10-12 MED ORDER — POTASSIUM CHLORIDE 10 MEQ PO TBCR
10.0000 meq | EXTENDED_RELEASE_TABLET | Freq: Every day | ORAL | Status: DC
  Administered 2011-10-12 – 2011-10-15 (×4): 10 meq via ORAL
  Filled 2011-10-12 (×4): qty 1

## 2011-10-12 NOTE — Progress Notes (Signed)
1137- Pt declined to walk x2, c/o feeling "woozy". Will f/u in afternoon.

## 2011-10-12 NOTE — Progress Notes (Addendum)
18 Days Post-Op Procedure(s) (LRB): TRICUSPID VALVE REPLACEMENT (TVR) (N/A) MAZE (N/A) MITRAL VALVE (MV) REPLACEMENT (N/A)  Subjective: Patient feels fairly well this am. Just finished "washing up". Has not had a bm in a couple of days, but does not want a laxative.  Objective: Vital signs in last 24 hours: Patient Vitals for the past 24 hrs:  BP Temp Temp src Pulse Resp SpO2 Weight  10/12/11 0436 132/71 mmHg 98.5 F (36.9 C) Oral 73  16  95 % 163 lb 2.3 oz (74 kg)  10/11/11 2015 125/78 mmHg 99.7 F (37.6 C) Oral 77  16  96 % -  10/11/11 2014 - - - - - 91 % -  10/11/11 1353 106/68 mmHg 98.6 F (37 C) Oral 71  18  94 % -   Pre op weight 73.9 kg Current Weight  10/12/11 163 lb 2.3 oz (74 kg)      Intake/Output from previous day: 11/30 0701 - 12/01 0700 In: 840 [P.O.:840] Out: 950 [Urine:950]   Physical Exam:  Cardiovascular: RRR, Grad I-II systolic murmur Pulmonary: Clear to auscultation bilaterally; no rales, wheezes, or rhonchi. Abdomen: Soft, non tender, bowel sounds present. Extremities: Mild bilateral lower extremity edema. Wounds: Clean and dry.  No erythema or signs of infection.  Lab Results: CBC: Basename 10/12/11 0500 10/10/11 0622  WBC 13.6* 15.2*  HGB 8.4* 8.4*  HCT 26.7* 26.9*  PLT 230 124*   BMET:  Basename 10/12/11 0500 10/11/11 0540  NA 134* 137  K 3.7 3.8  CL 93* 95*  CO2 31 29  GLUCOSE 105* 95  BUN 41* 40*  CREATININE 1.76* 1.82*  CALCIUM 9.0 8.8    PT/INR:  Basename 10/12/11 0500  LABPROT 34.0*  INR 3.29*   ABG:  INR: Will add last result for INR, ABG once components are confirmed Will add last 4 CBG results once components are confirmed  Assessment/Plan:  1. CV - SR. Continue Lopressor 50 bid and Amiodarone 200 bid.  Continue to hold Coumadin as INR still above 3. 2.  Pulmonary - Encourage incentive spirometer. Continue with O2 (was on at home prior to surgery) 3.Creatnine decreased 1.82 to 1.76. Will hold lasix again today.  ACE already discontinued. 4.  Acute blood loss anemia - H/H stable at 8.4/26.7. 5.Supplement KCL with 10 meq this am. 6.WBC decreased from 15.2 to 13.6. Remains afebrile. No signs of wound infection. Blood cultures negative thus far. Urine culture showed 15,000 klebsiella pneumonia. 7.DM-CBGs 98/89/111. Continue Metformin.  Ardelle Balls, PA 10/12/2011   I have seen and examined the patient and agree with the assessment and plan as outlined.  Transfer 2000  Jarek Longton H 10/12/2011 1:12 PM

## 2011-10-12 NOTE — Progress Notes (Signed)
Patient ambulated 172ft using a rolling walker on room air. Patient has a very slow, steady gait. Patient paused three times during ambulation. Patient returned to chair and resting. Patient's O2 saturation was 91-92% on room air upon return. Will continue to monitor.

## 2011-10-12 NOTE — Progress Notes (Signed)
CARDIAC REHAB PHASE I   PRE:  Rate/Rhythm: 80SR 1HB  BP:  Supine:   Sitting: 113/96  Standing:    SaO2: 94%RA  MODE:  Ambulation: 150 ft   POST:  Rate/Rhythem: 95SR  BP:  Supine:   Sitting: 102/63  Standing:    SaO2: 91%RA  Pt walked 150 ft on RA with rolling walker and asst x 1. Encouraged pt to try to go farther without success. No c/o dizziness. Stated felt better today. To recliner with call bell. [phone]  Duanne Limerick

## 2011-10-12 NOTE — Progress Notes (Signed)
PHARMACIST - PHYSICIAN COMMUNICATION Attention Doree Fudge, PA CONCERNING:  METFORMIN SAFE ADMINISTRATION POLICY  RECOMMENDATION: Metformin has been placed on DISCONTINUE (rejected order) STATUS and should be reordered only after any of the conditions below are ruled out.  Current safety recommendations include avoiding metformin for a minimum of 48 hours after the patient's exposure to intravenous contrast media.  DESCRIPTION:  The Pharmacy Committee has adopted a policy that restricts the use of metformin in hospitalized patients until all the contraindications to administration have been ruled out. Specific contraindications are: []  Serum creatinine ? 1.5 for males [x]  Serum creatinine ? 1.4 for females []  Shock, acute MI, sepsis, hypoxemia, dehydration []  Planned administration of intravenous iodinated contrast media []  Heart Failure patients with low EF []  Acute or chronic metabolic acidosis (including DKA)   Metformin should not be continued at this time. The renal function has yet to improve.  Thanks, Georgina Pillion, PharmD  10/12/2011 12:10 PM

## 2011-10-13 LAB — GLUCOSE, CAPILLARY: Glucose-Capillary: 120 mg/dL — ABNORMAL HIGH (ref 70–99)

## 2011-10-13 LAB — PROTIME-INR: INR: 3.16 — ABNORMAL HIGH (ref 0.00–1.49)

## 2011-10-13 MED ORDER — WARFARIN SODIUM 1 MG PO TABS
1.0000 mg | ORAL_TABLET | Freq: Every day | ORAL | Status: DC
  Administered 2011-10-13 – 2011-10-15 (×3): 1 mg via ORAL
  Filled 2011-10-13 (×3): qty 1

## 2011-10-13 MED ORDER — LACTULOSE 10 GM/15ML PO SOLN
20.0000 g | Freq: Once | ORAL | Status: AC
  Administered 2011-10-13: 20 g via ORAL
  Filled 2011-10-13: qty 30

## 2011-10-13 NOTE — Progress Notes (Signed)
10/13/2011 4:23 PM Nursing Note: Patient ambulated 150 feet with RN, rolling walker and on room air. Patient tolerated well. Patient to complete one more walk prior to dinner with RN.  Jondavid Schreier, Blanchard Kelch

## 2011-10-13 NOTE — Progress Notes (Addendum)
19 Days Post-Op Procedure(s) (LRB): TRICUSPID VALVE REPLACEMENT (TVR) (N/A) MAZE (N/A) MITRAL VALVE (MV) REPLACEMENT (N/A)  Subjective: Patient is passing a lot of flatus but has not had a bowel movement, despite prune juice and dulcolax.  She refuses an enema so will try lactulose.  She denies abdominal pain but is a "little sick to her stomach" this am.  Objective: Vital signs in last 24 hours: Patient Vitals for the past 24 hrs:  BP Temp Temp src Pulse Resp SpO2 Weight  10/13/11 0412 135/80 mmHg 98.3 F (36.8 C) Oral 77  18  93 % 162 lb 14.4 oz (73.891 kg)  10/12/11 2144 102/72 mmHg - - 92  - - -  10/12/11 2050 136/87 mmHg 98 F (36.7 C) Oral 93  18  93 % -  10/12/11 1437 105/48 mmHg - - - - - -  10/12/11 1432 87/70 mmHg 98.3 F (36.8 C) Oral 83  18  95 % -  10/12/11 1045 122/65 mmHg - - 78  - - -   Pre op weight 73.9 kg Current Weight  10/13/11 162 lb 14.4 oz (73.891 kg)      Intake/Output from previous day: 12/01 0701 - 12/02 0700 In: 610 [P.O.:600; I.V.:10] Out: 1650 [Urine:1650]   Physical Exam:  Cardiovascular: RRR, Grad I-II systolic murmur Pulmonary: Clear to auscultation bilaterally; no rales, wheezes, or rhonchi. Abdomen: Soft, non tender, distended, high pitched bowel sounds present. Extremities: Mild bilateral lower extremity edema. Wounds: Clean and dry.  No erythema or signs of infection.  Lab Results: CBC:  Basename 10/12/11 0500  WBC 13.6*  HGB 8.4*  HCT 26.7*  PLT 230   BMET:   Basename 10/12/11 0500 10/11/11 0540  NA 134* 137  K 3.7 3.8  CL 93* 95*  CO2 31 29  GLUCOSE 105* 95  BUN 41* 40*  CREATININE 1.76* 1.82*  CALCIUM 9.0 8.8    PT/INR:   Basename 10/13/11 0630  LABPROT 32.9*  INR 3.16*   ABG:  INR: Will add last result for INR, ABG once components are confirmed Will add last 4 CBG results once components are confirmed  Assessment/Plan:  1. CV - SR. Continue Lopressor 50 bid and Amiodarone 200 bid.  Continue to hold  Coumadin as INR still above 3. 2.  Pulmonary - Encourage incentive spirometer. Continue with O2 (was on at home prior to surgery) 3.Last creatnine  Down to 1.76. Check in am. Lasix on hold. 4.  Acute blood loss anemia - H/H stable at 8.4/26.7. 6.WBC decreased from 15.2 to 13.6. Remains afebrile. No signs of wound infection. Blood cultures negative thus far. Urine culture showed 15,000 klebsiella pneumonia (possible contaminant) 7.DM-CBGs 106/115/120. Metformin discontinued by pharmacy secondary to creatinine clearance. 8.Laculose for constipation.  Ardelle Balls, PA 10/13/2011   ZIMMERMAN,DONIELLE M 10/13/2011 8:12 AM   I have seen and examined the patient and agree with the assessment and plan as outlined.  Doing better. Renal function improving. INR starting to trend down.  Will restart low dose Coumadin. Possibly ready for d/c 2-3 days.  Purcell Nails 10/13/2011 12:38 PM

## 2011-10-13 NOTE — Progress Notes (Signed)
Patient ID: [name], female   DOB: 05/04/1941, 70 y.o.   MRN: [id number] Cardiology is following as needed.  I have nothing to add this morning.

## 2011-10-13 NOTE — Progress Notes (Signed)
10/13/2011 11:34 AM Nursing Note: Patient has been refusing metoprolol for past two days related to apprehension of low BP after hypotensive episode during ambulation on Friday 11/30. Education provided to patient by RN. Doree Fudge [facility] made aware of patient refusal. Orthostatic VS obtained per order. Lying BP 109/70 HR 80. Sitting BP 130/78 HR 82. Standing BP 145/89 HR 80. Standing after BP 166/87 HR 81.  Will continue to closely monitor patient. Encouraged patient to ambulate after lunch with RN.  Najee Cowens, Blanchard Kelch

## 2011-10-14 DIAGNOSIS — I059 Rheumatic mitral valve disease, unspecified: Secondary | ICD-10-CM

## 2011-10-14 LAB — CBC
HCT: 27.7 % — ABNORMAL LOW (ref 36.0–46.0)
MCH: 27.9 pg (ref 26.0–34.0)
MCHC: 31.4 g/dL (ref 30.0–36.0)
MCV: 88.8 fL (ref 78.0–100.0)
Platelets: 278 10*3/uL (ref 150–400)
RDW: 16.4 % — ABNORMAL HIGH (ref 11.5–15.5)
WBC: 15.4 10*3/uL — ABNORMAL HIGH (ref 4.0–10.5)

## 2011-10-14 LAB — CULTURE, BLOOD (ROUTINE X 2)
Culture  Setup Time: 201211272149
Culture: NO GROWTH

## 2011-10-14 LAB — BASIC METABOLIC PANEL
BUN: 34 mg/dL — ABNORMAL HIGH (ref 6–23)
CO2: 29 mEq/L (ref 19–32)
Calcium: 9.1 mg/dL (ref 8.4–10.5)
Chloride: 95 mEq/L — ABNORMAL LOW (ref 96–112)
Creatinine, Ser: 1.48 mg/dL — ABNORMAL HIGH (ref 0.50–1.10)

## 2011-10-14 LAB — GLUCOSE, CAPILLARY

## 2011-10-14 LAB — PROTIME-INR: INR: 3.15 — ABNORMAL HIGH (ref 0.00–1.49)

## 2011-10-14 MED ORDER — LACTULOSE 10 GM/15ML PO SOLN
20.0000 g | Freq: Once | ORAL | Status: AC
  Administered 2011-10-14: 20 g via ORAL
  Filled 2011-10-14: qty 30

## 2011-10-14 MED ORDER — FUROSEMIDE 40 MG PO TABS: 40.0000 mg | ORAL_TABLET | Freq: Every day | ORAL | Status: DC

## 2011-10-14 MED ORDER — POTASSIUM CHLORIDE 10 MEQ PO TBCR: 10.0000 meq | EXTENDED_RELEASE_TABLET | Freq: Every day | ORAL | Status: DC

## 2011-10-14 NOTE — Progress Notes (Signed)
Subjective:  Paatient feels stronger this am.  Wants to go home today. Rhythm remains NSR.  BP stable.  Objective:  Vital Signs in the last 24 hours: Temp:  [98.1 F (36.7 C)-98.5 F (36.9 C)] 98.1 F (36.7 C) (12/03 0618) Pulse Rate:  [79-90] 89  (12/03 0618) Resp:  [16-18] 16  (12/03 0618) BP: (97-111)/(62-67) 111/67 mmHg (12/03 0618) SpO2:  [90 %-91 %] 91 % (12/03 0618) Weight:  [163 lb 8 oz (74.163 kg)] 163 lb 8 oz (74.163 kg) (12/03 0500)  Intake/Output from previous day: 12/02 0701 - 12/03 0700 In: 480 [P.O.:480] Out: 600 [Urine:600] Intake/Output from this shift:       . amiodarone  200 mg Oral BID  . amitriptyline  150 mg Oral QHS  . bisacodyl  10 mg Oral Daily   Or  . bisacodyl  10 mg Rectal Daily  . chlorhexidine  15 mL Mouth/Throat BID  . cyanocobalamin  1,000 mcg Intramuscular Q30 days  . docusate sodium  200 mg Oral Daily  . feeding supplement  237 mL Oral TID WC  . folic acid  2 mg Oral Daily  . insulin aspart  0-24 Units Subcutaneous BID WC  . lactulose  20 g Oral Once  . letrozole  2.5 mg Oral Daily  . levothyroxine  137 mcg Oral QAC breakfast  . metoprolol tartrate  50 mg Oral BID  . pantoprazole  40 mg Oral Q1200  . potassium chloride  10 mEq Oral Daily  . povidone-iodine  1 application Topical BID  . rosuvastatin  10 mg Oral q1800  . warfarin  1 mg Oral q1800      Physical Exam: The patient appears to be in no distress.  Head and neck exam reveals that the pupils are equal and reactive.  The extraocular movements are full.  There is no scleral icterus.  Mouth and pharynx are benign.  No lymphadenopathy.  No carotid bruits.  The jugular venous pressure is normal.  Thyroid is not enlarged or tender.  Chest is clear to percussion and auscultation.  No rales or rhonchi.  Expansion of the chest is symmetrical.  Heart reveals no abnormal lift or heave.  First and second heart sounds are normal.  There is no murmur gallop rub or click. No mitral  regurg murmur.  The abdomen is soft and nontender.  Bowel sounds are normoactive.  There is no hepatosplenomegaly or mass.  There are no abdominal bruits.  Extremities reveal no phlebitis or edema.  Pedal pulses are good.  There is no cyanosis or clubbing.  Neurologic exam is normal strength and no lateralizing weakness.  No sensory deficits.  Integument reveals no rash  Lab Results:  Basename 10/12/11 0500  WBC 13.6*  HGB 8.4*  PLT 230    Basename 10/12/11 0500  NA 134*  K 3.7  CL 93*  CO2 31  GLUCOSE 105*  BUN 41*  CREATININE 1.76*   No results found for this basename: TROPONINI:2,CK,MB:2 in the last 72 hours Hepatic Function Panel No results found for this basename: PROT,ALBUMIN,AST,ALT,ALKPHOS,BILITOT,BILIDIR,IBILI in the last 72 hours No results found for this basename: CHOL in the last 72 hours No results found for this basename: PROTIME in the last 72 hours  Imaging: Imaging results have been reviewed.  No new studies.  Cardiac Studies:  Assessment/Plan:  Patient Active Problem List  Diagnoses  . S/P Mitral valve replacement      Doing well. Anticipate discharge soon. She will follow up for  cardiology with Dr. Diona Browner in [place].  .   .   .   .   .   .   .   .   .   .   .   .   .   .   .   .   .     LOS: 20 days    Cassell Clement 10/14/2011, 8:07 AM

## 2011-10-14 NOTE — Progress Notes (Signed)
   CARDIOTHORACIC SURGERY PROGRESS NOTE  20 Days Post-Op  S/P Procedure(s) (LRB): TRICUSPID VALVE REPLACEMENT (TVR) (N/A) MAZE (N/A) MITRAL VALVE (MV) REPLACEMENT (N/A)  Subjective: Feels better and wants to go home. Still no BM since 11/30  Objective: Vital signs in last 24 hours: Temp:  [98.1 F (36.7 C)-98.5 F (36.9 C)] 98.1 F (36.7 C) (12/03 0618) Pulse Rate:  [79-90] 89  (12/03 0618) Cardiac Rhythm:  [-]  Resp:  [16-18] 16  (12/03 0618) BP: (97-111)/(62-67) 111/67 mmHg (12/03 0618) SpO2:  [90 %-91 %] 91 % (12/03 0618) Weight:  [74.163 kg (163 lb 8 oz)] 163 lb 8 oz (74.163 kg) (12/03 0500)  Physical Exam:  Rhythm:   sinus  Breath sounds: clear  Heart sounds:  RRR  Incisions:  Healing nicely  Abdomen:  soft  Extremities:  warm   Intake/Output from previous day: 12/02 0701 - 12/03 0700 In: 480 [P.O.:480] Out: 600 [Urine:600] Intake/Output this shift: Total I/O In: 75 [P.O.:75] Out: -   Lab Results:  Basename 10/12/11 0500  WBC 13.6*  HGB 8.4*  HCT 26.7*  PLT 230   BMET:  Basename 10/12/11 0500  NA 134*  K 3.7  CL 93*  CO2 31  GLUCOSE 105*  BUN 41*  CREATININE 1.76*  CALCIUM 9.0    CBG (last 3)   Basename 10/14/11 0554 10/13/11 2122 10/13/11 1558  GLUCAP 113* 108* 122*   PT/INR:   Basename 10/13/11 0630  LABPROT 32.9*  INR 3.16*    CXR:  N/A  Assessment/Plan: S/P Procedure(s) (LRB): TRICUSPID VALVE REPLACEMENT (TVR) (N/A) MAZE (N/A) MITRAL VALVE (MV) REPLACEMENT (N/A)  Slowly improving Will try lactulose today Restart low dose lasix Recheck labs and CXR in am Tentatively for d/c home tomorrow with home health  Purcell Nails 10/14/2011

## 2011-10-14 NOTE — Progress Notes (Signed)
CARDIAC REHAB PHASE I   PRE:  Rate/Rhythm: 92SR 1HB  BP:  Supine:   Sitting: 120/72  Standing:    SaO2: 92%RA  MODE:  Ambulation: 240 ft   POST:  Rate/Rhythem: 98  BP:  Supine:   Sitting: 114/67  Standing:    SaO2: 90%RA  1345-1430 Pt needs much encouragement to mobilize. Came this am but pt wanted to sleep and we made appt for after lunch. Needed encouragement to go as far as we went. Tolerated well with rolling walker on RA. To recliner with call bell.  [name]

## 2011-10-14 NOTE — Progress Notes (Signed)
Patient refused to walk this afternoon. I will let the next shift know that patient needs to walk tonight. Patient only ambulated once today with cardiac rehab. Bernita Raisin, RN

## 2011-10-15 ENCOUNTER — Inpatient Hospital Stay (HOSPITAL_COMMUNITY): Payer: Medicare Other

## 2011-10-15 ENCOUNTER — Other Ambulatory Visit: Payer: Self-pay

## 2011-10-15 LAB — CBC
HCT: 27.8 % — ABNORMAL LOW (ref 36.0–46.0)
MCH: 28.1 pg (ref 26.0–34.0)
MCHC: 31.3 g/dL (ref 30.0–36.0)
MCV: 89.7 fL (ref 78.0–100.0)
RDW: 16.5 % — ABNORMAL HIGH (ref 11.5–15.5)

## 2011-10-15 LAB — GLUCOSE, CAPILLARY
Glucose-Capillary: 108 mg/dL — ABNORMAL HIGH (ref 70–99)
Glucose-Capillary: 108 mg/dL — ABNORMAL HIGH (ref 70–99)

## 2011-10-15 LAB — BASIC METABOLIC PANEL
BUN: 31 mg/dL — ABNORMAL HIGH (ref 6–23)
Calcium: 8.9 mg/dL (ref 8.4–10.5)
Creatinine, Ser: 1.36 mg/dL — ABNORMAL HIGH (ref 0.50–1.10)
GFR calc Af Amer: 45 mL/min — ABNORMAL LOW (ref >90)
GFR calc non Af Amer: 39 mL/min — ABNORMAL LOW (ref >90)
Glucose, Bld: 108 mg/dL — ABNORMAL HIGH (ref 70–99)
Potassium: 3.8 mEq/L (ref 3.5–5.1)

## 2011-10-15 LAB — PROTIME-INR: INR: 2.67 — ABNORMAL HIGH (ref 0.00–1.49)

## 2011-10-15 MED ORDER — AMIODARONE HCL 200 MG PO TABS: 200.0000 mg | ORAL_TABLET | Freq: Two times a day (BID) | ORAL | Status: DC

## 2011-10-15 MED ORDER — METOPROLOL TARTRATE 50 MG PO TABS: 50.0000 mg | ORAL_TABLET | Freq: Two times a day (BID) | ORAL | Status: DC

## 2011-10-15 MED ORDER — WARFARIN SODIUM 2 MG PO TABS: 2.0000 mg | ORAL_TABLET | Freq: Every day | ORAL | Status: DC

## 2011-10-15 MED ORDER — AMITRIPTYLINE HCL 150 MG PO TABS: 150.0000 mg | ORAL_TABLET | Freq: Every day | ORAL | Status: DC

## 2011-10-15 MED ORDER — HYDROCODONE-ACETAMINOPHEN 7.5-650 MG PO TABS: 1.0000 | ORAL_TABLET | Freq: Four times a day (QID) | ORAL | Status: DC | PRN

## 2011-10-15 NOTE — Discharge Summary (Signed)
[name] 1941/08/14 70 y.o. 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 In 2010 she was evaluated and underwent cardiac catheterization revealing normal coronary artery anatomy. She was found to have pulmonary hypertension. She was sent for pulmonary medicine consultation and she has been followed since then by Dr. Delton Coombes. She is on home oxygen therapy. She reports that over the last several years she has had continued progression of symptoms of exertional shortness of breath. She has had occasional tachypalpitations. In December 2011 she was hospitalized in [place], [place] with a mini stroke or transient neurologic deficit. She was started on Plavix at that time. In August of 2012 she was again hospitalized in [place] with shortness of breath and tachypalpitations. She was told that she was having episodes of irregular heart rhythms and she was offered possible invasive ablation therapy. The patient declined and stated that she wanted to followup with her primary cardiologist. She was seen in followup and transthoracic echocardiogram was performed 07/18/2011. This revealed mitral stenosis and mitral regurgitation with normal left ventricular systolic function. There was severe tricuspid regurgitation. The patient subsequently underwent transesophageal echocardiogram on 08/02/2011. 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 There was normal left ventricular size and function. There was severe tricuspid regurgitation. Left and right heart catheterization was  also performed 08/02/2011. This confirmed the presence of moderate to severe mitral stenosis. There was pulmonary hypertension. There was normal coronary artery anatomy with no significant coronary artery disease. The patient has been referred to consider mitral valve replacement and tricuspid valve repair with or without a Maze procedure.   Past Medical History   Diagnosis  Date   .  Pulmonary hypertension      s/p RHC 4/10 - PAP 86/29, wedge 21-23   .  Squamous cell carcinoma      Left arm   .  Gastric ulcer with hemorrhage    .  Rheumatic fever      Childhood   .  Breast cancer      2008 s/p chemo and radiation (last tx 4/09) s/p left mastectomy s/p 9 lymph node resection - [name][facility]   .  Coronary artery disease      Nonobstructive, LVEF 55%   .  Mitral regurgitation      Moderate   .  Tricuspid regurgitation      Moderate to severe   .  Stroke      12/11 [place]   .  Mitral stenosis with insufficiency, rheumatic     Past Surgical History   Procedure  Date   .  Left mastectomy    .  Partial gastrectomy     Family History   Problem  Relation  Age of Onset   .  Heart disease  Mother    .  Colon cancer  Mother    Social History  History   Substance Use Topics   .  Smoking status:  Never Smoker   .  Smokeless tobacco:  Never Used   .  Alcohol Use:  No    Current Outpatient Prescriptions   Medication  Sig  Dispense  Refill   .  amitriptyline (ELAVIL) 75 MG tablet  2 tabs every evening     .  clopidogrel (PLAVIX) 75 MG tablet  Take 75 mg by mouth daily.     .  cyanocobalamin (,VITAMIN B-12,) 1000 MCG/ML injection  Inject 1,000 mcg into the muscle every 30 (thirty) days.     .  diphenhydrAMINE (BENADRYL) 25 MG tablet  Take 25 mg by mouth at bedtime.     .  folic acid (FOLVITE) 1 MG tablet  Take 2 tabs every morning     .  furosemide (LASIX) 40 MG tablet  Take 40 mg by mouth daily.     .  hydrocodone-acetaminophen (LORCET PLUS) 7.5-650 MG per tablet  May  take up to three times daily as needed     .  letrozole (FEMARA) 2.5 MG tablet  Take 2.5 mg by mouth daily.     Marland Kitchen  levothyroxine (SYNTHROID, LEVOTHROID) 137 MCG tablet  Take 137 mcg by mouth daily.     .  metFORMIN (GLUCOPHAGE) 500 MG tablet  Take one tab every morning & 2 tabs with supper     .  metoprolol (LOPRESSOR) 50 MG tablet  Take 1 tablet by mouth Twice daily.     .  Multiple Vitamins-Minerals (CENTRUM SILVER PO)  Take 1 tablet by mouth daily.     .  nitroGLYCERIN (NITROSTAT) 0.4 MG SL tablet  Place 0.4 mg under the tongue every 5 (five) minutes as needed.     .  pantoprazole (PROTONIX) 40 MG tablet  Take 40 mg by mouth daily.     .  pirbuterol (MAXAIR) 200 MCG/INH inhaler  Inhale 2 puffs into the lungs 4 (four) times daily.     .  potassium chloride (KLOR-CON) 10 MEQ CR tablet  Take 10 mEq by mouth. 2 tabs am 1 tab pm     .  rosuvastatin (CRESTOR) 10 MG tablet  Take 10 mg by mouth daily.     .  valsartan (DIOVAN) 160 MG tablet  Take 160 mg by mouth daily.     Marland Kitchen  zolpidem (AMBIEN) 10 MG tablet  Take 10 mg by mouth at bedtime as needed.      Allergies   Allergen  Reactions   .  Butorphanol Tartrate      REACTION: migraines, nervous/agitated   .  Lipitor (Atorvastatin Calcium)  Diarrhea     nightmares   .  Revatio (Sildenafil Citrate)  Swelling     Review of Systems at time of consultation Constitutional: Positive for activity change, fatigue and unexpected weight change.  The patient complains of worsening fatigue. She has had decreased activity. Associated with this she has gained 10-15 pounds over the last 2 months.  HENT: Positive for congestion and postnasal drip.  She has not seen a dentist in several years and she has a broken tooth that she feels probably needs to be extracted.  Eyes: Negative.  Respiratory: Positive for shortness of breath and wheezing.  Cardiovascular: Positive for palpitations and leg swelling. Negative for chest pain.  The patient describes a 2-3 year  history of progressive symptoms of exertional shortness of breath. This has gotten particularly bad over the last several months such that now she gets short of breath with minimal physical activity. She denies orthopnea. She denies resting shortness of breath. She has not had exertional chest pain. She has had some occasional tachypalpitations without syncope. She has had mild lower extremity edema without significant abdominal bloating.  Gastrointestinal: Negative.  Genitourinary: Negative.  Musculoskeletal: Positive for back pain and arthralgias.  Diffuse chronic arthralgias and myalgias particularly afflicting her neck shoulders back in her right knee. However, she ambulates without assistance and her primary limitation is shortness of breath.  Neurological: Negative.  Hematological: Negative.  Psychiatric/Behavioral: Negative.  BP 121/63  Pulse 86  Resp 20  Ht 5\' 2"  (1.575 m)  Wt 165 lb (74.844 kg)  BMI 30.18 kg/m2  SpO2 98%    Physical Exam at time of consultation Vitals reviewed.  Constitutional: She is oriented to person, place, and time. She appears well-developed.  HENT:  Head: Normocephalic.  Eyes: Pupils are equal, round, and reactive to light.  Neck: Normal range of motion. No JVD present.  Cardiovascular: Normal rate and regular rhythm.  Murmur heard. There is a soft systolic murmur heard best along the left sternal border  Abdominal: Soft. Bowel sounds are normal. She exhibits no distension and no mass.  Musculoskeletal: Normal range of motion. She exhibits no edema.  Lymphadenopathy:  She has no cervical adenopathy.  Neurological: She is alert and oriented to person, place, and time.  Skin: Skin is warm and dry.  Psychiatric: She has a normal mood and affect. Her behavior is normal. Judgment and thought content normal.    Diagnostic Tests:  Transesophageal echocardiogram performed 08/02/2011 is reviewed. This reveals findings consistent with rheumatic mitral valve  disease. There is at least moderate mitral stenosis with valve area estimated 1.8 centimeters squared. There is at least moderate mitral regurgitation. The leaflets of the mitral valve are severely restricted during both systole and diastole. This corresponds to type IIIA mitral valve dysfunction as would be expected for rheumatic disease. The degree of mitral regurgitation is bad enough that balloon valvuloplasty probably would be unwise. Left ventricular size and function appears normal. There is severe left atrial enlargement. There is trace aortic insufficiency. There is severe tricuspid regurgitation. There is moderate right ventricular chamber enlargement. The dysfunction of the tricuspid valve appears to be purely functional, type I dysfunction, although the leaflets of the valve are not well visualized.  Left and right heart catheterization performed 08/02/2011 is reviewed. This demonstrates normal coronary artery anatomy with no significant coronary artery disease. There is left dominant coronary circulation. Left ventricular systolic function appears normal. There is severe pulmonary hypertension with PA pressures measured 75/26 and mean pulmonary artery pressure 46. Pulmonary vascular resistance measured 4.3 Woods units. Pulmonary A wedge pressure was 20 but there were large V waves. Mean gradient across the mitral valve measured 14.4 mm mercury corresponding to calculated valve area 1.3 cm.   Patient was admitted and 09/24/2011 underwent the following procedure.  Procedure:  Mitral Valve Replacement (#27 mm Medtronic Mosaic Porcine Bioprosthetic Tissue Valve)  Tricuspid Valve Repair (#26 mm Edwards mc3 ring annuloplasty)  Maze Procedure (complete biatrial lesion set with clipping of left atrial appendage)  Postoperative hospital course: Overall the patient has progressed slowly. She was weaned  from the ventilator without difficulty. She did require low-dose inotropic support initially for  several days. This was gradually weaned without significant difficulty. He also did have some postoperative bradycardia requiring AV pacing. This improved over time however she did develop postoperative atrial fibrillation which was subsequently stabilized to normal sinus rhythm. She had routine blood loss anemia that was expected requiring transfusion in the early postoperative period. Additionally she had excessive volume overload which has slowly improved over time with diuresis. she is slowly improving in regard to her cardiac rehabilitation. Oxygen has been  weaned and she maintains adequate saturations on room air. She has been started on Coumadin and her current INR is 2.67 on 10/15/2011. He also had acute renal insufficiency with a peak creatinine of 2.02. ACE inhibitor was discontinued and diuretics were adjusted and she has improved with time. Most recent creatinine is 1.36.Marland Kitchen all routine lines, monitors, drainage devices have all been discontinued in the standard fashion. Her overall status is felt to be stable for discharge on today's date.    Basename 10/15/11 0615 10/14/11 0830  NA 136 136  K 3.8 3.8  CL 97 95*  CO2 28 29  GLUCOSE 108* 110*  BUN 31* 34*  CALCIUM 8.9 9.1    Basename 10/15/11 0615 10/14/11 0830  WBC 12.0* 15.4*  HGB 8.7* 8.7*  HCT 27.8* 27.7*  PLT 263 278    Basename 10/15/11 0615 10/14/11 0830  INR 2.67* 3.15*     Discharge Instructions:  The patient is discharged to home with extensive instructions on wound care and progressive ambulation.  They are instructed not to drive or perform any heavy lifting until returning to see the physician in his office.  Discharge Diagnosis:  Mitral Regurgitation, Triscupid Regurgitation, Atrial Fib Acute blood loss anemia Acute renal insufficiency Secondary Diagnosis: Patient Active Problem List  Diagnoses  . CARCINOMA, BREAST  . CARCINOMA, SQUAMOUS CELL  . RHEUMATIC FEVER  . CORONARY ATHEROSCLEROSIS NATIVE CORONARY  ARTERY  . PULMONARY HYPERTENSION  . ALLERGIC RHINITIS  . INTRINSIC ASTHMA, UNSPECIFIED  . GASTRIC ULCER, ACUTE, HEMORRHAGE  . DYSPNEA  . CT, CHEST, ABNORMAL  . Stroke  . SVT (supraventricular tachycardia)  . Hypoxia  . Paroxysmal SVT (supraventricular tachycardia)  . S/P MVR (mitral valve replacement)  . S/P tricuspid valve repair  . S/P Maze operation for atrial fibrillation  . Chronic congestive heart failure   Past Medical History  Diagnosis Date  . Pulmonary hypertension     s/p RHC 4/10 - PAP 86/29, wedge 21-23  . Gastric ulcer with hemorrhage   . Rheumatic fever     Childhood  . Coronary artery disease     Nonobstructive, LVEF 55%  . Mitral regurgitation     Moderate  . 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 Mitral stenosis with insufficiency, rheumatic   . Hypertension   . Heart murmur   . Stroke     12/11 [place],LEFT SIDE WEAKNESS  . Shortness of breath     EXERTION,PT. STATES SHE HAS PULMONARY HTN  . Pneumonia 1990'S     SEVERAL EPISODES  . Asthma     PT. STATES MILD,USES INHALERS PRN,  . Asthma     PT. ON OXYGEN 2 L/[place]   . Diabetes mellitus   . Hypothyroidism   . Anemia, pernicious 1979  . Hepatitis     PT. STATES SHE HAD IT WHEN SHE WAS 14 YRS. OLD  . Blood transfusion 2008  . Arthritis   . GERD (gastroesophageal reflux disease)   . Fibromyalgia   . Squamous cell carcinoma     Left arm  . Breast cancer     2008 s/p chemo and radiation (last tx 4/09) s/p left mastectomy s/p 9 lymph node resection - [name]  . CHF (congestive heart failure) 2010       [name], [name]  Home Medication Instructions [id number]   Printed on:10/15/11 1332  Medication Information  cyanocobalamin (,VITAMIN B-12,) 1000 MCG/ML injection Inject 1 mL (1,000 mcg total) into the muscle every 30 (thirty) days.           folic acid (FOLVITE) 1 MG tablet Take 2 tablets (2 mg total) by mouth daily.           letrozole  (FEMARA) 2.5 MG tablet Take 1 tablet (2.5 mg total) by mouth daily.           levothyroxine (SYNTHROID, LEVOTHROID) 137 MCG tablet Take 1 tablet (137 mcg total) by mouth daily.           Multiple Vitamins-Minerals (CENTRUM SILVER) tablet Take 1 tablet by mouth daily.           pantoprazole (PROTONIX) 40 MG tablet Take 1 tablet (40 mg total) by mouth daily.           rosuvastatin (CRESTOR) 10 MG tablet Take 1 tablet (10 mg total) by mouth daily.           furosemide (LASIX) 40 MG tablet Take 1 tablet (40 mg total) by mouth daily.           potassium chloride (KLOR-CON) 10 MEQ CR tablet Take 1 tablet (10 mEq total) by mouth daily.           amiodarone (PACERONE) 200 MG tablet Take 1 tablet (200 mg total) by mouth 2 (two) times daily.           amitriptyline (ELAVIL) 150 MG tablet Take 1 tablet (150 mg total) by mouth at bedtime.           hydrocodone-acetaminophen (LORCET PLUS) 7.5-650 MG per tablet Take 1 tablet by mouth every 6 (six) hours as needed. For pain           metoprolol (LOPRESSOR) 50 MG tablet Take 1 tablet (50 mg total) by mouth 2 (two) times daily.           warfarin (COUMADIN) 2 MG tablet Take 1 tablet (2 mg total) by mouth daily at 6 PM.             Disposition: Stable for discharge home in good condition.    Gershon Crane, PA-C 10/15/2011  1:32 PM

## 2011-10-15 NOTE — Progress Notes (Signed)
Subjective:  Patient offers no complaints today Rhythm appears to be NSR.  P waves not well seen on telemetry strips.  Will get 12 lead EKG  Objective:  Vital Signs in the last 24 hours: Temp:  [98.1 F (36.7 C)-98.7 F (37.1 C)] 98.7 F (37.1 C) (12/04 0610) Pulse Rate:  [82-89] 82  (12/04 0610) Resp:  [18] 18  (12/04 0610) BP: (119-145)/(70-81) 119/70 mmHg (12/04 0610) SpO2:  [92 %-95 %] 95 % (12/04 0610) Weight:  [160 lb 12.8 oz (72.938 kg)] 160 lb 12.8 oz (72.938 kg) (12/04 0610)  Intake/Output from previous day: 12/03 0701 - 12/04 0700 In: 915 [P.O.:915] Out: -  Intake/Output from this shift: Total I/O In: 240 [P.O.:240] Out: -      . amiodarone  200 mg Oral BID  . amitriptyline  150 mg Oral QHS  . bisacodyl  10 mg Oral Daily   Or  . bisacodyl  10 mg Rectal Daily  . chlorhexidine  15 mL Mouth/Throat BID  . cyanocobalamin  1,000 mcg Intramuscular Q30 days  . docusate sodium  200 mg Oral Daily  . feeding supplement  237 mL Oral TID WC  . folic acid  2 mg Oral Daily  . insulin aspart  0-24 Units Subcutaneous BID WC  . lactulose  20 g Oral Once  . letrozole  2.5 mg Oral Daily  . levothyroxine  137 mcg Oral QAC breakfast  . metoprolol tartrate  50 mg Oral BID  . pantoprazole  40 mg Oral Q1200  . potassium chloride  10 mEq Oral Daily  . povidone-iodine  1 application Topical BID  . rosuvastatin  10 mg Oral q1800  . warfarin  1 mg Oral q1800      Physical Exam: The patient appears to be in no distress.  Head and neck exam reveals that the pupils are equal and reactive.  The extraocular movements are full.  There is no scleral icterus.  Mouth and pharynx are benign.  No lymphadenopathy.  No carotid bruits.  The jugular venous pressure is normal.  Thyroid is not enlarged or tender.  Chest is clear to percussion and auscultation.  No rales or rhonchi.  Expansion of the chest is symmetrical.  Heart reveals no abnormal lift or heave.  First and second heart sounds  are normal.  There is a soft ejection murmur at base.Marland Kitchen No mitral regurg murmur.  The abdomen is soft and nontender.  Bowel sounds are normoactive.  There is no hepatosplenomegaly or mass.  There are no abdominal bruits.  Extremities reveal no phlebitis or edema.  Pedal pulses are good.  There is no cyanosis or clubbing.  Neurologic exam is normal strength and no lateralizing weakness.  No sensory deficits.  Integument reveals no rash  Lab Results:  Basename 10/15/11 0615 10/14/11 0830  WBC 12.0* 15.4*  HGB 8.7* 8.7*  PLT 263 278    Basename 10/15/11 0615 10/14/11 0830  NA 136 136  K 3.8 3.8  CL 97 95*  CO2 28 29  GLUCOSE 108* 110*  BUN 31* 34*  CREATININE 1.36* 1.48*   No results found for this basename: TROPONINI:2,CK,MB:2 in the last 72 hours Hepatic Function Panel No results found for this basename: PROT,ALBUMIN,AST,ALT,ALKPHOS,BILITOT,BILIDIR,IBILI in the last 72 hours No results found for this basename: CHOL in the last 72 hours No results found for this basename: PROTIME in the last 72 hours  Imaging: Imaging results have been reviewed.  No new studies.  Cardiac Studies:  Assessment/Plan:  Patient Active Problem List  Diagnoses  . S/P Mitral valve replacement      Doing well. Anticipate discharge soon. She will follow up for cardiology with Dr. Diona Browner in [place].  . Will get EKG today.  .   .   .   .   .   .   .   .   .   .   .   .   .   .   .   .     LOS: 21 days    [name] 10/15/2011, 10:30 AM

## 2011-10-15 NOTE — Progress Notes (Signed)
Removed CT sutures per MD orders per hospital policy.  Applied Benzoin and steri strips to CT suture site. Patient tolerated well, will continue to monitor closely. Bernita Raisin, RN

## 2011-10-15 NOTE — Progress Notes (Signed)
   CARE MANAGEMENT NOTE 10/15/2011  Patient:  [name],[name]   Account Number:  [id number]  Date Initiated:  09/26/2011  Documentation initiated by:  Avie Arenas  Subjective/Objective Assessment:   post op mini thoracotomy for valve surgeries on 09-24-11.  Lives at home with husband.-     Action/Plan:   Anticipated DC Date:  10/11/2011   Anticipated DC Plan:  HOME W HOME HEALTH SERVICES      DC Planning Services  CM consult      [facility] Choice  HOME HEALTH   Choice offered to / List presented to:  C-1 Patient        HH arranged  HH-1 RN  HH-2 PT      HH agency  OTHER - SEE NOTE   Status of service:  Completed, signed off Medicare Important Message given?   (If response is "NO", the following Medicare IM given date fields will be blank) Date Medicare IM given:   Date Additional Medicare IM given:    Discharge Disposition:  HOME W HOME HEALTH SERVICES  Per UR Regulation:  Reviewed for med. necessity/level of care/duration of stay  Comments:  10/15/11 Paxtyn Wisdom,RN,BSN 1130 PT FOR DC HOME TODAY WITH FAMILY.  WOULD BENEFIT FROM [facility] FOR RESTORATIVE CARE AND HHPT SAFETY EVAL, AS RECOMMENDED BY P.T.  WILL OBTAIN ORDERS AND ARRANGE.  PT PREFERS [facility] OF [facility] FOR [facility] PROVIDER. Phone #[phone]  10/02/11 Eliana Lueth,RN,BSN 1445 MET WITH PT TO DISCUSS DC PLANS.  PTA, PT LIVES AT HOME WITH HUSBAND AND IS INDEPENDENT. SHE OWNS A WC, ROLLING WALKER, CANE AND HAS A TUB SEAT.  P.T. RECOMMENDING HHPT AT DISCHARGE.  MD, PLEASE ORDER PRIOR TO DC HOME, AND COMPLETE FACE TO FACE DOCUMENTATION. [facility] YOU!  10-01-11 Algis Liming, RNBSWN [phone] UR Completed.  09-26-11 2:35pm Avie Arenas, RNBSN [phone] UR Completed.

## 2011-10-15 NOTE — Progress Notes (Signed)
Patient refused to walk this evening.  Will continue to encourage patient to ambulate.  Arva Chafe 10/15/2011

## 2011-10-15 NOTE — Progress Notes (Signed)
Physical Therapy Treatment Patient Details Name: [name] MRN: [id number] DOB: 1941/09/18 Today's Date: 10/15/2011  PT Assessment/Plan  PT - Assessment/Plan Comments on Treatment Session: Patient supposed to D/C home with assistance today.  Pt. feels ready and PT feels that pt. is ready for D/C home with 24 hour supervision. Pt. has met all PT goals set on 10/04/11.  Will D/C PT in hospital.  Recommend HHPT safety eval and to progress with balance activities when incision heals further.   PT Plan: Discharge plan remains appropriate;Frequency remains appropriate PT Frequency: Min 3X/week Follow Up Recommendations: Home health PT Equipment Recommended: None recommended by PT PT Goals  Acute Rehab PT Goals PT Goal: Supine/Side to Sit - Progress: Met PT Transfer Goal: Bed to Chair/Chair to Bed - Progress: Met PT Goal: Ambulate - Progress: Met  PT Treatment Precautions/Restrictions  Precautions Precautions: Fall;Sternal Required Braces or Orthoses: No Restrictions Weight Bearing Restrictions: No Mobility (including Balance) Bed Mobility Supine to Sit: Not tested (comment) Sit to Supine - Left: Not tested (comment) Transfers Sit to Stand: 7: Independent;From chair/3-in-1;With upper extremity assist Sit to Stand Details (indicate cue type and reason): Used UEs minimally. Ambulation/Gait Ambulation/Gait Assistance: 6: Modified independent (Device/Increase time) Ambulation/Gait Assistance Details (indicate cue type and reason): Pt. continues to c/o slight dizziness but safe with ambulation Ambulation Distance (Feet): 200 Feet Assistive device: Rolling walker Gait Pattern: Within Functional Limits Stairs: No (Declines practice) Wheelchair Mobility Wheelchair Mobility: No  Posture/Postural Control Posture/Postural Control: Postural limitations Postural Limitations: kyphotic with head down due to neck arthritis Balance Balance Assessed: No Exercise    End of Session PT - End of  Session Equipment Utilized During Treatment: Gait belt Activity Tolerance: Patient tolerated treatment well Patient left: in chair;with call bell in reach Nurse Communication: Mobility status for ambulation General Behavior During Session: [name] for tasks performed Cognition: [facility] for tasks performed  INGOLD,Kolbee Stallman 10/15/2011, 11:28 AM [facility] [phone] (pager)

## 2011-10-15 NOTE — Progress Notes (Signed)
Patient is being discharged from PT services secondary to:  Goals met and no further therapy needs identified.  Please see latest Therapy Progress Note for current level of functioning and progress toward goals.  Progress and discharge plan and discussed with patient and they agree.     [facility] [phone] (pager)

## 2011-10-15 NOTE — Progress Notes (Signed)
Ed completed.  Ethelda Chick CES, [place] [phone]

## 2011-10-15 NOTE — Progress Notes (Addendum)
21 Days Post-Op Procedure(s) (LRB): TRICUSPID VALVE REPLACEMENT (TVR) (N/A) MAZE (N/A) MITRAL VALVE (MV) REPLACEMENT (N/A)  Subjective: Feels well.  Wants to go home.  Still trying to have BM- got laxative this am.  Objective: Vital signs in last 24 hours: Patient Vitals for the past 24 hrs:  BP Temp Temp src Pulse Resp SpO2 Weight  10/15/11 1030 112/62 mmHg - - 78  - - -  10/15/11 0610 119/70 mmHg 98.7 F (37.1 C) Oral 82  18  95 % 160 lb 12.8 oz (72.938 kg)  10/14/11 2205 145/81 mmHg 98.1 F (36.7 C) Oral 89  18  95 % -  10/14/11 1300 120/72 mmHg 98.5 F (36.9 C) Oral 89  18  92 % -   Current Weight  10/15/11 160 lb 12.8 oz (72.938 kg)     Intake/Output from previous day: 12/03 0701 - 12/04 0700 In: 915 [P.O.:915] Out: -     PHYSICAL EXAM:  Heart: RRR Lungs: clear Wound: clean and dry Extremities: mild LE edema Lab Results: CBC: Basename 10/15/11 0615 10/14/11 0830  WBC 12.0* 15.4*  HGB 8.7* 8.7*  HCT 27.8* 27.7*  PLT 263 278   BMET:  Basename 10/15/11 0615 10/14/11 0830  NA 136 136  K 3.8 3.8  CL 97 95*  CO2 28 29  GLUCOSE 108* 110*  BUN 31* 34*  CREATININE 1.36* 1.48*  CALCIUM 8.9 9.1    PT/INR:  Basename 10/15/11 0615  LABPROT 28.9*  INR 2.67*   Chest x-ray stable  Assessment/Plan: S/P Procedure(s) (LRB): TRICUSPID VALVE REPLACEMENT (TVR) (N/A) MAZE (N/A) MITRAL VALVE (MV) REPLACEMENT (N/A)  Stable Home later today Per Dr. Cornelius Moras, will resume Lasix at D/C    LOS: 21 days    COLLINS,GINA H 10/15/2011   I have seen and examined the patient and agree with the assessment and plan as outlined.  Keren Alverio H 10/15/2011 5:14 PM

## 2011-10-15 NOTE — Discharge Summary (Signed)
I agree with the above discharge summary and plan for follow-up.  OWEN,CLARENCE H  

## 2011-10-17 ENCOUNTER — Encounter: Payer: Self-pay | Admitting: *Deleted

## 2011-10-17 LAB — POCT INR: INR: 1.9

## 2011-10-18 ENCOUNTER — Ambulatory Visit (INDEPENDENT_AMBULATORY_CARE_PROVIDER_SITE_OTHER): Payer: Self-pay | Admitting: Internal Medicine

## 2011-10-18 DIAGNOSIS — Z952 Presence of prosthetic heart valve: Secondary | ICD-10-CM

## 2011-10-18 DIAGNOSIS — Z7901 Long term (current) use of anticoagulants: Secondary | ICD-10-CM

## 2011-10-18 DIAGNOSIS — R0989 Other specified symptoms and signs involving the circulatory and respiratory systems: Secondary | ICD-10-CM

## 2011-10-18 DIAGNOSIS — Z954 Presence of other heart-valve replacement: Secondary | ICD-10-CM

## 2011-10-18 DIAGNOSIS — I4891 Unspecified atrial fibrillation: Secondary | ICD-10-CM

## 2011-10-18 NOTE — Patient Instructions (Signed)

## 2011-10-22 DIAGNOSIS — Z954 Presence of other heart-valve replacement: Secondary | ICD-10-CM | POA: Insufficient documentation

## 2011-10-22 DIAGNOSIS — Z7901 Long term (current) use of anticoagulants: Secondary | ICD-10-CM | POA: Insufficient documentation

## 2011-10-22 DIAGNOSIS — I4891 Unspecified atrial fibrillation: Secondary | ICD-10-CM | POA: Insufficient documentation

## 2011-10-24 ENCOUNTER — Other Ambulatory Visit: Payer: Self-pay | Admitting: Thoracic Surgery (Cardiothoracic Vascular Surgery)

## 2011-10-24 DIAGNOSIS — I059 Rheumatic mitral valve disease, unspecified: Secondary | ICD-10-CM

## 2011-10-25 ENCOUNTER — Ambulatory Visit (INDEPENDENT_AMBULATORY_CARE_PROVIDER_SITE_OTHER): Payer: Self-pay | Admitting: Cardiology

## 2011-10-25 DIAGNOSIS — Z954 Presence of other heart-valve replacement: Secondary | ICD-10-CM

## 2011-10-25 DIAGNOSIS — I4891 Unspecified atrial fibrillation: Secondary | ICD-10-CM

## 2011-10-25 DIAGNOSIS — Z7901 Long term (current) use of anticoagulants: Secondary | ICD-10-CM

## 2011-10-25 DIAGNOSIS — R0989 Other specified symptoms and signs involving the circulatory and respiratory systems: Secondary | ICD-10-CM

## 2011-10-25 LAB — PROTIME-INR: INR: 1.4 — AB (ref 0.9–1.1)

## 2011-10-28 ENCOUNTER — Ambulatory Visit
Admission: RE | Admit: 2011-10-28 | Discharge: 2011-10-28 | Disposition: A | Discharge status: Home / Self Care | Payer: Medicare Other | Source: Ambulatory Visit | Attending: Thoracic Surgery (Cardiothoracic Vascular Surgery) | Admitting: Thoracic Surgery (Cardiothoracic Vascular Surgery)

## 2011-10-28 ENCOUNTER — Encounter: Payer: Self-pay | Admitting: Thoracic Surgery (Cardiothoracic Vascular Surgery)

## 2011-10-28 ENCOUNTER — Ambulatory Visit (INDEPENDENT_AMBULATORY_CARE_PROVIDER_SITE_OTHER): Payer: Self-pay | Admitting: Thoracic Surgery (Cardiothoracic Vascular Surgery)

## 2011-10-28 VITALS — BP 99/53 | HR 77 | Resp 16 | Ht 62.0 in | Wt 156.0 lb

## 2011-10-28 DIAGNOSIS — I059 Rheumatic mitral valve disease, unspecified: Secondary | ICD-10-CM

## 2011-10-28 DIAGNOSIS — Z952 Presence of prosthetic heart valve: Secondary | ICD-10-CM

## 2011-10-28 DIAGNOSIS — Z954 Presence of other heart-valve replacement: Secondary | ICD-10-CM

## 2011-10-28 DIAGNOSIS — Z9889 Other specified postprocedural states: Secondary | ICD-10-CM

## 2011-10-28 DIAGNOSIS — Z8679 Personal history of other diseases of the circulatory system: Secondary | ICD-10-CM

## 2011-10-28 DIAGNOSIS — Z09 Encounter for follow-up examination after completed treatment for conditions other than malignant neoplasm: Secondary | ICD-10-CM

## 2011-10-28 DIAGNOSIS — I079 Rheumatic tricuspid valve disease, unspecified: Secondary | ICD-10-CM

## 2011-10-28 MED ORDER — AMIODARONE HCL 200 MG PO TABS: 200.0000 mg | ORAL_TABLET | Freq: Every day | ORAL | Status: DC

## 2011-10-28 MED ORDER — METFORMIN HCL 500 MG PO TABS: 500.0000 mg | ORAL_TABLET | Freq: Every day | ORAL | Status: DC

## 2011-10-28 NOTE — Progress Notes (Signed)
[address]            [name] [address]          [phone]     CARDIOTHORACIC SURGERY OFFICE NOTE  Referring Provider is Bensimhon, Bevelyn Buckles, MD PCP is Alton Revere, MD, MD   HPI:  Patient returns for follow-up status post mitral valve replacement, tricuspid valve repair, and Maze procedure on November 13. Her postoperative recovery was slow, but overall the patient has continued to gradually improve.  She was discharged with home health therapy and her family helping with her daily needs. She returns for routine followup today. She states that her prothrombin time was checked this past Friday and her dose of Coumadin increased. Her INR was reportedly 1.5 at that time. She states that her appetite is slowly improving. Her strength is slowly improving. She is no longer using oxygen and she states her oxygen saturation is stable and at least 90% at all times.  She still has some exertional shortness of breath, but this too is slowly improving. Overall she is satisfied with her progress. She does note that her blood sugars have been starting to increase and she wonders if her metformin should be restarted.   Current Outpatient Prescriptions  Medication Sig Dispense Refill  . amiodarone (PACERONE) 200 MG tablet Take 1 tablet (200 mg total) by mouth 2 (two) times daily.  60 tablet  1  . amitriptyline (ELAVIL) 150 MG tablet Take 1 tablet (150 mg total) by mouth at bedtime.  30 tablet  1  . cyanocobalamin (,VITAMIN B-12,) 1000 MCG/ML injection Inject 1 mL (1,000 mcg total) into the muscle every 30 (thirty) days.  1 mL  0  . folic acid (FOLVITE) 1 MG tablet Take 2 tablets (2 mg total) by mouth daily.  60 tablet  1  . furosemide (LASIX) 40 MG tablet Take 1 tablet (40 mg total) by mouth daily.  30 tablet  1  . hydrocodone-acetaminophen (LORCET PLUS) 7.5-650 MG per tablet Take 1 tablet by mouth every 6 (six) hours as needed. For pain  50 tablet  0  . letrozole  (FEMARA) 2.5 MG tablet Take 1 tablet (2.5 mg total) by mouth daily.      Marland Kitchen levothyroxine (SYNTHROID, LEVOTHROID) 137 MCG tablet Take 1 tablet (137 mcg total) by mouth daily.      . metoprolol (LOPRESSOR) 50 MG tablet Take 1 tablet (50 mg total) by mouth 2 (two) times daily.  60 tablet  1  . Multiple Vitamins-Minerals (CENTRUM SILVER) tablet Take 1 tablet by mouth daily.      . pantoprazole (PROTONIX) 40 MG tablet Take 1 tablet (40 mg total) by mouth daily.      . potassium chloride (KLOR-CON) 10 MEQ CR tablet Take 1 tablet (10 mEq total) by mouth daily.  30 tablet  1  . rosuvastatin (CRESTOR) 10 MG tablet Take 1 tablet (10 mg total) by mouth daily.      Marland Kitchen warfarin (COUMADIN) 2 MG tablet Take 1 tablet (2 mg total) by mouth daily at 6 PM.  60 tablet  1      Physical Exam:   BP 99/53  Pulse 77  Resp 16  Ht 5\' 2"  (1.575 m)  Wt 156 lb (70.761 kg)  BMI 28.53 kg/m2  SpO2 94%  HEENT:  Unremarkable  Chest:   Clear to auscultation with slightly diminished breath sounds  at the lung bases, more so on the right than the left. Her median sternotomy incision is healing nicely and the sternum is stable on palpation  CV:   Regular rate and rhythm with no murmurs  Abdomen:  Soft and nontender  Extremities:  Warm and well perfused with trivial bilateral lower extremity edema  Diagnostic Tests:  Chest x-ray performed today demonstrates stable radiographic appearance of small right pleural effusion with right lower lobe atelectasis. All the sternal wires appear intact.   Impression:  Overall the patient continues to gradually improve following recent mitral valve replacement, tricuspid valve repair, and Maze procedure. She is maintaining sinus rhythm. She has a small right pleural effusion which is stable. She appears essentially euvolemic on exam. Her blood sugars have started to rise as her appetite is improved.  Plan:  I've instructed the patient to decrease her dose of amiodarone to 200 mg daily.  I've also instructed the patient to restart metformin 500 mg daily with breakfast. She will schedule an appointment with her primary care physician for further followup related to her diabetes management sometime in early January. She will also schedule an appointment to see Dr. Gala Romney for followup. I've encouraged patient to continue working on her physical activity and mobility. All of her questions been addressed. We'll plan to see her back in 6 weeks for further followup with a repeat chest x-ray at that time.   Salvatore Decent. Cornelius Moras, MD 10/28/2011 5:03 PM

## 2011-10-28 NOTE — Patient Instructions (Signed)
Decrease amiodarone to 200 mg daily and restart metformin 500 mg daily with breakfast.  Continue to increase physical activity as tolerated.  The patient has been reminded to continue to avoid any heavy lifting or strenuous use of arms or shoulders for at least a total of three months from the time of surgery.

## 2011-10-29 ENCOUNTER — Telehealth: Payer: Self-pay | Admitting: Internal Medicine

## 2011-10-29 NOTE — Telephone Encounter (Signed)
New message:  Discharged from Home health and they will no longer be able to draw lab work.  Please call the home health back regarding this.

## 2011-10-29 NOTE — Telephone Encounter (Signed)
INR is what needs to be drawn, will forward to cvrr

## 2011-10-29 NOTE — Telephone Encounter (Signed)
Spoke with Morrie Sheldon with HH.  Pt is due to have INR checked on Friday.  She will be an [place] patient.  Gave number for [facility] office for pt to call and make appt.

## 2011-11-01 ENCOUNTER — Telehealth: Payer: Self-pay | Admitting: *Deleted

## 2011-11-01 NOTE — Telephone Encounter (Signed)
Pt called today.  Did not make appt for INR check today as instructed.  Wants to know if PMD can check INR on 12/24 at her appt then.  Told her our office would be closing at lunch 12/24 and we would need result by then or PMD would have to manage.  She will check with him and will plan F/U visits here in [place] after that.

## 2011-11-08 ENCOUNTER — Telehealth: Payer: Self-pay | Admitting: Internal Medicine

## 2011-11-08 NOTE — Telephone Encounter (Signed)
Busy signal

## 2011-11-08 NOTE — Telephone Encounter (Signed)
New Msg: Pt daughter calling wanting to schedule appt with Dr. Gala Romney and wants to know if he wants to see pt in [facility], or if pt care needs to be transferred to another cardiologists. Please return pt call to discuss further.

## 2011-11-13 NOTE — Telephone Encounter (Signed)
Unable to reach pt's daughter at the number listed, (call would not go through per recording).  Pt was called and notified of date, place and time of appt.

## 2011-11-15 ENCOUNTER — Ambulatory Visit: Payer: Self-pay | Admitting: *Deleted

## 2011-11-15 ENCOUNTER — Telehealth: Payer: Self-pay | Admitting: *Deleted

## 2011-11-15 DIAGNOSIS — Z7901 Long term (current) use of anticoagulants: Secondary | ICD-10-CM

## 2011-11-15 DIAGNOSIS — I4891 Unspecified atrial fibrillation: Secondary | ICD-10-CM

## 2011-11-15 DIAGNOSIS — Z954 Presence of other heart-valve replacement: Secondary | ICD-10-CM

## 2011-11-15 NOTE — Telephone Encounter (Signed)
Called pt to make sure PMD is managing her coumadin.  She said it is being managed by Dr Darius Bump in [place] and he has been checking it every Monday.

## 2011-12-16 ENCOUNTER — Ambulatory Visit (HOSPITAL_COMMUNITY)
Admission: RE | Admit: 2011-12-16 | Discharge: 2011-12-16 | Disposition: A | Discharge status: Home / Self Care | Payer: Medicare Other | Source: Ambulatory Visit | Attending: Internal Medicine | Admitting: Internal Medicine

## 2011-12-16 ENCOUNTER — Encounter: Payer: Self-pay | Admitting: Thoracic Surgery (Cardiothoracic Vascular Surgery)

## 2011-12-16 ENCOUNTER — Ambulatory Visit
Admission: RE | Admit: 2011-12-16 | Discharge: 2011-12-16 | Disposition: A | Discharge status: Home / Self Care | Payer: Medicare Other | Source: Ambulatory Visit | Attending: Thoracic Surgery (Cardiothoracic Vascular Surgery) | Admitting: Thoracic Surgery (Cardiothoracic Vascular Surgery)

## 2011-12-16 ENCOUNTER — Encounter (HOSPITAL_COMMUNITY): Payer: Self-pay

## 2011-12-16 ENCOUNTER — Other Ambulatory Visit: Payer: Self-pay

## 2011-12-16 ENCOUNTER — Other Ambulatory Visit: Payer: Self-pay | Admitting: Thoracic Surgery (Cardiothoracic Vascular Surgery)

## 2011-12-16 ENCOUNTER — Ambulatory Visit (INDEPENDENT_AMBULATORY_CARE_PROVIDER_SITE_OTHER): Payer: Self-pay | Admitting: Thoracic Surgery (Cardiothoracic Vascular Surgery)

## 2011-12-16 VITALS — BP 93/51 | HR 64 | Resp 18 | Ht 62.0 in | Wt 156.0 lb

## 2011-12-16 VITALS — BP 100/68 | HR 67 | Wt 158.8 lb

## 2011-12-16 DIAGNOSIS — Z954 Presence of other heart-valve replacement: Secondary | ICD-10-CM

## 2011-12-16 DIAGNOSIS — I059 Rheumatic mitral valve disease, unspecified: Secondary | ICD-10-CM

## 2011-12-16 DIAGNOSIS — I2789 Other specified pulmonary heart diseases: Secondary | ICD-10-CM | POA: Insufficient documentation

## 2011-12-16 DIAGNOSIS — I509 Heart failure, unspecified: Secondary | ICD-10-CM

## 2011-12-16 DIAGNOSIS — I5032 Chronic diastolic (congestive) heart failure: Secondary | ICD-10-CM | POA: Insufficient documentation

## 2011-12-16 DIAGNOSIS — Z8679 Personal history of other diseases of the circulatory system: Secondary | ICD-10-CM

## 2011-12-16 DIAGNOSIS — Z952 Presence of prosthetic heart valve: Secondary | ICD-10-CM

## 2011-12-16 DIAGNOSIS — I079 Rheumatic tricuspid valve disease, unspecified: Secondary | ICD-10-CM

## 2011-12-16 DIAGNOSIS — Z9889 Other specified postprocedural states: Secondary | ICD-10-CM

## 2011-12-16 DIAGNOSIS — I4891 Unspecified atrial fibrillation: Secondary | ICD-10-CM

## 2011-12-16 MED ORDER — ASPIRIN EC 81 MG PO TBEC: 81.0000 mg | DELAYED_RELEASE_TABLET | Freq: Every day | ORAL | Status: AC

## 2011-12-16 MED ORDER — SPIRONOLACTONE 25 MG PO TABS: 12.5000 mg | ORAL_TABLET | Freq: Every day | ORAL | Status: DC

## 2011-12-16 MED ORDER — FUROSEMIDE 40 MG PO TABS: 40.0000 mg | ORAL_TABLET | Freq: Every day | ORAL | Status: DC

## 2011-12-16 NOTE — Patient Instructions (Addendum)
Stop Amiodarone  Stop Coumadin  Start Spironolactone 12.5 mg daily  Follow up next week with an ECHO  Refer to Outpatient Physical Therapry

## 2011-12-16 NOTE — Progress Notes (Signed)
[address]            [name] [address]          [phone]     CARDIOTHORACIC SURGERY OFFICE NOTE  Referring Provider is Bensimhon, Bevelyn Buckles, MD PCP is Alton Revere, MD, MD   HPI:  Patient returns for follow-up status post mitral valve replacement, tricuspid valve repair, and Maze procedure on 09/24/2011. She was last seen here in the office on 10/28/2011. Since then the patient has done fairly well.  She reports that her breathing is much better than it was prior to surgery. She is no longer using oxygen at all. She has mild exertional shortness of breath. She has had a couple of episodes where she became unsteady on her feet and stumbled and fell. She reports that this was not at all related to any sort of dizzy spells or weakness but rather that she tends to get clumsy at times and she suffers from long-standing severe arthritis. She was seen in followup earlier today by Dr. Gala Romney in amiodarone and Coumadin were both discontinued. She has also been started on spironolactone to increase her diuretic therapy. Overall the patient reports doing well and she has no complaints.   Current Outpatient Prescriptions  Medication Sig Dispense Refill  . amitriptyline (ELAVIL) 150 MG tablet Take 1 tablet (150 mg total) by mouth at bedtime.  30 tablet  1  . cyanocobalamin (,VITAMIN B-12,) 1000 MCG/ML injection Inject 1 mL (1,000 mcg total) into the muscle every 30 (thirty) days.  1 mL  0  . folic acid (FOLVITE) 1 MG tablet Take 2 tablets (2 mg total) by mouth daily.  60 tablet  1  . furosemide (LASIX) 40 MG tablet Take 1 tablet (40 mg total) by mouth daily.  30 tablet  6  . hydrocodone-acetaminophen (LORCET PLUS) 7.5-650 MG per tablet Take 1 tablet by mouth every 6 (six) hours as needed. For pain  50 tablet  0  . letrozole (FEMARA) 2.5 MG tablet Take 1 tablet (2.5 mg total) by mouth daily.      Marland Kitchen levothyroxine (SYNTHROID, LEVOTHROID) 137 MCG tablet Take 1  tablet (137 mcg total) by mouth daily.      . metFORMIN (GLUCOPHAGE) 500 MG tablet Take 1 tablet (500 mg total) by mouth daily with breakfast.  30 tablet  11  . metoprolol (LOPRESSOR) 50 MG tablet Take 1 tablet (50 mg total) by mouth 2 (two) times daily.  60 tablet  1  . Multiple Vitamins-Minerals (CENTRUM SILVER) tablet Take 1 tablet by mouth daily.      . pantoprazole (PROTONIX) 40 MG tablet Take 1 tablet (40 mg total) by mouth daily.      . rosuvastatin (CRESTOR) 10 MG tablet Take 1 tablet (10 mg total) by mouth daily.      Marland Kitchen spironolactone (ALDACTONE) 25 MG tablet Take 0.5 tablets (12.5 mg total) by mouth daily.  30 tablet  6  . zolpidem (AMBIEN) 5 MG tablet Take 5 mg by mouth at bedtime as needed.          Physical Exam:   BP 93/51  Pulse 64  Resp 18  Ht 5\' 2"  (1.575 m)  Wt 156 lb (70.761 kg)  BMI 28.53 kg/m2  SpO2 98%  General:  Chronically ill appearing but noticeably improved in comparison with how she looked last November.  Chest:   Clear  to auscultation with symmetrical breath sounds  CV:   Regular rate and rhythm with grade 2-3/6 systolic murmur heard best along the right sternal border  Abdomen:  Soft mildly distended Nontender  Extremities:  Warm and well-perfused. There is mild lower extremity edema.  Diagnostic Tests:  2 channel telemetry rhythm strip demonstrates what appears to be sinus rhythm.   Impression:  Patient remains clinically stable nearly 3 months following surgery.. She still week and she would benefit from physical rehabilitation. She is maintaining sinus rhythm. She has had some stumbles and falls, and given the fact that she remains in sinus rhythm I think it is reasonable to stop Coumadin. She probably should be taking aspirin as an alternative.  Plan:  We will check a chest x-ray to make sure that her previous small right pleural effusion has resolved. I've instructed the patient to take enteric-coated aspirin 81 mg daily. She will continue to  followup with Dr. Gala Romney will monitor her medical therapy. We will plan to see her back in 3 months for further followup and rhythm check. I've cautioned her that she probably should not be driving an automobile until she is more stable physically in terms of strength and stamina.   Salvatore Decent. Cornelius Moras, MD 12/16/2011 2:33 PM   ADDENDUM:  CHEST - 2 VIEW  Comparison: Chest x-ray of 10/28/2011  Findings: Aeration of the lungs has improved with decrease in basilar atelectasis. Linear atelectasis remains at the right lung base with a small right pleural effusion present. The left effusion has resolved. Cardiomegaly is stable.  IMPRESSION: Improved aeration with mild linear right basilar atelectasis and small right pleural effusion remaining.   OWEN,CLARENCE H 12/16/2011 3:44 PM

## 2011-12-24 ENCOUNTER — Ambulatory Visit (HOSPITAL_COMMUNITY)
Admission: RE | Admit: 2011-12-24 | Discharge: 2011-12-24 | Disposition: A | Discharge status: Home / Self Care | Payer: Medicare Other | Source: Ambulatory Visit | Attending: Adult Health | Admitting: Adult Health

## 2011-12-24 ENCOUNTER — Ambulatory Visit (HOSPITAL_COMMUNITY)
Admission: RE | Admit: 2011-12-24 | Discharge: 2011-12-24 | Disposition: A | Discharge status: Home / Self Care | Payer: Medicare Other | Source: Ambulatory Visit | Attending: Internal Medicine | Admitting: Internal Medicine

## 2011-12-24 VITALS — BP 122/78 | HR 71 | Wt 155.0 lb

## 2011-12-24 DIAGNOSIS — R4182 Altered mental status, unspecified: Secondary | ICD-10-CM | POA: Insufficient documentation

## 2011-12-24 DIAGNOSIS — Z954 Presence of other heart-valve replacement: Secondary | ICD-10-CM

## 2011-12-24 DIAGNOSIS — Z8673 Personal history of transient ischemic attack (TIA), and cerebral infarction without residual deficits: Secondary | ICD-10-CM | POA: Insufficient documentation

## 2011-12-24 DIAGNOSIS — R0609 Other forms of dyspnea: Secondary | ICD-10-CM | POA: Insufficient documentation

## 2011-12-24 DIAGNOSIS — I4891 Unspecified atrial fibrillation: Secondary | ICD-10-CM | POA: Insufficient documentation

## 2011-12-24 DIAGNOSIS — I2789 Other specified pulmonary heart diseases: Secondary | ICD-10-CM

## 2011-12-24 DIAGNOSIS — I2510 Atherosclerotic heart disease of native coronary artery without angina pectoris: Secondary | ICD-10-CM | POA: Insufficient documentation

## 2011-12-24 DIAGNOSIS — I359 Nonrheumatic aortic valve disorder, unspecified: Secondary | ICD-10-CM | POA: Insufficient documentation

## 2011-12-24 DIAGNOSIS — R0989 Other specified symptoms and signs involving the circulatory and respiratory systems: Secondary | ICD-10-CM | POA: Insufficient documentation

## 2011-12-24 DIAGNOSIS — M25551 Pain in right hip: Secondary | ICD-10-CM

## 2011-12-24 DIAGNOSIS — M25559 Pain in unspecified hip: Secondary | ICD-10-CM

## 2011-12-24 DIAGNOSIS — I369 Nonrheumatic tricuspid valve disorder, unspecified: Secondary | ICD-10-CM

## 2011-12-24 DIAGNOSIS — Z952 Presence of prosthetic heart valve: Secondary | ICD-10-CM

## 2011-12-24 LAB — BASIC METABOLIC PANEL
BUN: 18 mg/dL (ref 6–23)
CO2: 28 mEq/L (ref 19–32)
Chloride: 98 mEq/L (ref 96–112)
Creatinine, Ser: 1.11 mg/dL — ABNORMAL HIGH (ref 0.50–1.10)
GFR calc Af Amer: 58 mL/min — ABNORMAL LOW (ref >90)
Potassium: 4.1 mEq/L (ref 3.5–5.1)

## 2011-12-24 LAB — CBC
HCT: 30 % — ABNORMAL LOW (ref 36.0–46.0)
Hemoglobin: 9 g/dL — ABNORMAL LOW (ref 12.0–15.0)
MCHC: 30 g/dL (ref 30.0–36.0)
MCV: 84.5 fL (ref 78.0–100.0)
RDW: 17.9 % — ABNORMAL HIGH (ref 11.5–15.5)
WBC: 10.1 10*3/uL (ref 4.0–10.5)

## 2011-12-24 LAB — URINALYSIS, MICROSCOPIC ONLY
Bilirubin Urine: NEGATIVE
Glucose, UA: NEGATIVE mg/dL
Hgb urine dipstick: NEGATIVE
Specific Gravity, Urine: 1.015 (ref 1.005–1.030)

## 2011-12-24 LAB — DIFFERENTIAL
Basophils Absolute: 0 10*3/uL (ref 0.0–0.1)
Eosinophils Relative: 2 % (ref 0–5)
Lymphocytes Relative: 15 % (ref 12–46)
Monocytes Absolute: 0.8 10*3/uL (ref 0.1–1.0)
Monocytes Relative: 8 % (ref 3–12)
Neutro Abs: 7.5 10*3/uL (ref 1.7–7.7)

## 2011-12-24 NOTE — Patient Instructions (Addendum)
Follow up in 3 months

## 2011-12-24 NOTE — Assessment & Plan Note (Signed)
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 Volume status looks OK. May need to consider adding PDE-5 inhibitor.

## 2011-12-24 NOTE — Progress Notes (Signed)
Patient ID: Kelechi Astarita, female   DOB: 10-30-1941, 71 y.o.   MRN: [id number]  HPI:Ms. 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 Status post mitral valve replacement, tricuspid valve repair, and Maze procedure on November 13    Presurgery studies:  Had TEE and cath recently. TEE showed EF 55%. RV was ok. Mild to moderate MS and moderate MR. Severe TR. Cath showed minimal CAD. But suggested severe MS and significant MR with related PH. Right atrial pressure mean of 8, RV pressure of 72/6, with EDP of 13, PA pressure 75/26 with a mean of 46. Pulmonary capillary wedge pressure, mean of 20 with V-waves in the 35 to 40 range. Fick cardiac output 5.2. Cardiac index 2.7. Pulmonary vascular resistance was 4.3 Woods units. Mitral valve area was calculated at 1.32 sq cm. Mean valve gradient was 14.4 mmHg. Saturations, aortic sat was 98% and pulmonary artery saturation was 63% and 68%.   We saw her last week and spiro added and f/u echo ordered.   I reviewed echo today:  2/12 ECHO EF 40%. MVR stable. RV moderately-severely dialted/HK. Moderate to severe TR pRSVP 60.    She is here for follow up. Over the last few days she feels bad. Vomited yesterday. Unsteady when she walks. Complaining of R hip pain due to fall. Pleasantly confused today.  Denies SOB/PND/CP/Orthopnea/F/C. Very mild lower extremity edema. Last visit Coumadin stopped.    ROS: All systems negative except as listed in HPI, PMH and Problem List.  Past Medical History  Diagnosis Date  . Pulmonary hypertension     s/p RHC 4/10 - PAP 86/29, wedge 21-23  . Gastric ulcer with hemorrhage   . Rheumatic fever     Childhood  . Coronary artery disease     Nonobstructive, LVEF 55%  . Mitral regurgitation     Moderate  . 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 Mitral  stenosis with insufficiency, rheumatic   . Hypertension   . Heart murmur   . Stroke     12/11 [place],LEFT SIDE WEAKNESS  . Shortness of breath     EXERTION,PT. STATES SHE HAS PULMONARY HTN  . Pneumonia 1990'S     SEVERAL EPISODES  . Asthma     PT. STATES MILD,USES INHALERS PRN,  . Asthma     PT. ON OXYGEN 2 L/[place]   . Diabetes mellitus   . Hypothyroidism   . Anemia, pernicious 1979  . Hepatitis     PT. STATES SHE HAD IT WHEN SHE WAS 14 YRS. OLD  . Blood transfusion 2008  . Arthritis   . GERD (gastroesophageal reflux disease)   . Fibromyalgia   . Squamous cell carcinoma     Left arm  . Breast cancer     2008 s/p chemo and radiation (last tx 4/09) s/p left mastectomy s/p 9 lymph node resection - [name], [facility]  . CHF (congestive heart failure) 2010    Current Outpatient Prescriptions  Medication Sig Dispense Refill  . amitriptyline (ELAVIL) 150 MG tablet Take 1 tablet (150 mg total) by mouth at bedtime.  30 tablet  1  . aspirin EC 81 MG tablet Take 1 tablet (81 mg total) by mouth daily.  150 tablet  2  . cyanocobalamin (,VITAMIN B-12,) 1000 MCG/ML injection Inject 1 mL (1,000 mcg total) into the muscle  every 30 (thirty) days.  1 mL  0  . folic acid (FOLVITE) 1 MG tablet Take 2 tablets (2 mg total) by mouth daily.  60 tablet  1  . furosemide (LASIX) 40 MG tablet Take 1 tablet (40 mg total) by mouth daily.  30 tablet  6  . hydrocodone-acetaminophen (LORCET PLUS) 7.5-650 MG per tablet Take 1 tablet by mouth every 6 (six) hours as needed. For pain  50 tablet  0  . letrozole (FEMARA) 2.5 MG tablet Take 1 tablet (2.5 mg total) by mouth daily.      Marland Kitchen levothyroxine (SYNTHROID, LEVOTHROID) 137 MCG tablet Take 1 tablet (137 mcg total) by mouth daily.      . metFORMIN (GLUCOPHAGE) 500 MG tablet Take 1 tablet (500 mg total) by mouth daily with breakfast.  30 tablet  11  . metoprolol (LOPRESSOR) 50 MG tablet Take 1 tablet (50 mg total) by mouth 2 (two) times daily.  60 tablet   1  . Multiple Vitamins-Minerals (CENTRUM SILVER) tablet Take 1 tablet by mouth daily.      . pantoprazole (PROTONIX) 40 MG tablet Take 1 tablet (40 mg total) by mouth daily.      . rosuvastatin (CRESTOR) 10 MG tablet Take 1 tablet (10 mg total) by mouth daily.      Marland Kitchen spironolactone (ALDACTONE) 25 MG tablet Take 0.5 tablets (12.5 mg total) by mouth daily.  30 tablet  6  . zolpidem (AMBIEN) 5 MG tablet Take 5 mg by mouth at bedtime as needed.         PHYSICAL EXAM: Filed Vitals:   12/24/11 1201  BP: 122/78  Pulse: 71   Weight change:  155 (158) General: Fatigued. No acute distress . No resp difficulty HEENT: normal Neck: supple. JVP to ear with prominent CV waves.  Carotids 2+ bilaterally; no bruits. No lymphadenopathy or thryomegaly appreciated. Cor: PMI normal. Regular rate & rhythm. No rubs, gallops or 3/6 TR. Lungs:  clear Abdomen: soft, obese,  nontender, distended. No hepatosplenomegaly. No bruits or masses. Good bowel sounds. Extremities: no cyanosis, clubbing, rash, R and LLE trace edema. R hip tender to palpation pain with external rotation Neuro: alert & orientedx3, cranial nerves grossly intact. Moves all 4 extremities w/o difficulty. Affect pleasant. No drift or asterixis.     ASSESSMENT & PLAN:

## 2011-12-24 NOTE — Assessment & Plan Note (Signed)
S/p MVR. Stable.

## 2011-12-24 NOTE — Assessment & Plan Note (Signed)
Unclear etiology. She denies taking extra meds. Will check labs and UA. Otherwise f/u with PCP.

## 2011-12-24 NOTE — Assessment & Plan Note (Signed)
Will send for hip films.

## 2011-12-25 ENCOUNTER — Telehealth (HOSPITAL_COMMUNITY): Payer: Self-pay | Admitting: *Deleted

## 2011-12-25 NOTE — Telephone Encounter (Signed)
Left mess on VM labs and x-ray ok and results were faxed to Dr Darius Bump at [phone]

## 2011-12-25 NOTE — Telephone Encounter (Signed)
Ms Richardson Dopp called today to get the results of her mother's lab work and x ray from yesterday.  She would like the results called into her mothers pcp for tomorrow's visit as well. Dr Darius Bump [phone]. Thanks.

## 2011-12-29 NOTE — Assessment & Plan Note (Signed)
She is maintaining SR s/p Maze. Will stop amio and coumadin - particularly in the setting of recent falls.

## 2011-12-29 NOTE — Assessment & Plan Note (Signed)
Her volume status is up mildly. I suspect this may be more RHF. Will add spiro 12.5mg  daily. Follow volume status and labs closely. Reinforced need for daily weights and reviewed use of sliding scale diuretics.

## 2011-12-29 NOTE — Progress Notes (Signed)
Encounter addended by: Dolores Patty, MD on: 12/29/2011 10:11 AM<BR>     Documentation filed: Notes Section

## 2011-12-29 NOTE — Progress Notes (Addendum)
Patient ID: [name], female   DOB: 02-Feb-1941, 71 y.o.   MRN: [id number] Patient ID: [name], female   DOB: 07/24/1941, 71 y.o.   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  She is status post mitral valve replacement, tricuspid valve repair, and Maze procedure on November 13  Pre-op TEE and cath results:TEE showed EF 55%. RV was ok. Mild to moderate MS and moderate MR. Severe TR. Cath showed minimal CAD. But suggested severe MS and significant MR with related PH. Right atrial pressure mean of 8, RV pressure of 72/6, with EDP of 13, PA pressure 75/26 with a mean of 46. Pulmonary capillary wedge pressure, mean of 20 with V-waves in the 35 to 40 range. Fick cardiac output 5.2. Cardiac index 2.7. Pulmonary vascular resistance was 4.3 Woods units. Mitral valve area was calculated at 1.32 sq cm. Mean valve gradient was 14.4 mmHg. Saturations, aortic sat was 98% and pulmonary artery saturation was 63% and 68%.   She is here for follow up. Breathing better since surgery. She has had 4 falls over the last 2 months. Most recent fall last week . Denies syncope. She denies hitting her head. Denies SOB/PND/CP/Orthopnea. + Lower extremity edema. Compliant with medications.    ROS: All systems negative except as listed in HPI, PMH and Problem List.  Past Medical History  Diagnosis Date  . Pulmonary hypertension     s/p RHC 4/10 - PAP 86/29, wedge 21-23  . Gastric ulcer with hemorrhage   . Rheumatic fever     Childhood  . Coronary artery disease     Nonobstructive, LVEF 55%  . Mitral regurgitation     Moderate  . 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 Mitral stenosis with insufficiency, rheumatic   . Hypertension   . Heart murmur   . Stroke     12/11 [name],LEFT  SIDE WEAKNESS  . Shortness of breath     EXERTION,PT. STATES SHE HAS PULMONARY HTN  . Pneumonia 1990'S     SEVERAL EPISODES  . Asthma     PT. STATES MILD,USES INHALERS PRN,  . Asthma     PT. ON OXYGEN 2 L/[place]   . Diabetes mellitus   . Hypothyroidism   . Anemia, pernicious 1979  . Hepatitis     PT. STATES SHE HAD IT WHEN SHE WAS 14 YRS. OLD  . Blood transfusion 2008  . Arthritis   . GERD (gastroesophageal reflux disease)   . Fibromyalgia   . Squamous cell carcinoma     Left arm  . Breast cancer     2008 s/p chemo and radiation (last tx 4/09) s/p left mastectomy s/p 9 lymph node resection - [name][facility]  . CHF (congestive heart failure) 2010    Current Outpatient Prescriptions  Medication Sig Dispense Refill  . amitriptyline (ELAVIL) 150 MG tablet Take 1 tablet (150 mg total) by mouth at bedtime.  30 tablet  1  . cyanocobalamin (,VITAMIN B-12,) 1000 MCG/ML injection Inject 1 mL (1,000 mcg total) into the muscle every 30 (thirty) days.  1 mL  0  . folic acid (FOLVITE) 1 MG tablet Take 2 tablets (2 mg total) by mouth daily.  60 tablet  1  . furosemide (LASIX) 40 MG tablet Take 1 tablet (40 mg  total) by mouth daily.  30 tablet  6  . hydrocodone-acetaminophen (LORCET PLUS) 7.5-650 MG per tablet Take 1 tablet by mouth every 6 (six) hours as needed. For pain  50 tablet  0  . letrozole (FEMARA) 2.5 MG tablet Take 1 tablet (2.5 mg total) by mouth daily.      Marland Kitchen levothyroxine (SYNTHROID, LEVOTHROID) 137 MCG tablet Take 1 tablet (137 mcg total) by mouth daily.      . metFORMIN (GLUCOPHAGE) 500 MG tablet Take 1 tablet (500 mg total) by mouth daily with breakfast.  30 tablet  11  . metoprolol (LOPRESSOR) 50 MG tablet Take 1 tablet (50 mg total) by mouth 2 (two) times daily.  60 tablet  1  . Multiple Vitamins-Minerals (CENTRUM SILVER) tablet Take 1 tablet by mouth daily.      . pantoprazole (PROTONIX) 40 MG tablet Take 1 tablet (40 mg total) by mouth daily.      . rosuvastatin  (CRESTOR) 10 MG tablet Take 1 tablet (10 mg total) by mouth daily.      Marland Kitchen aspirin EC 81 MG tablet Take 1 tablet (81 mg total) by mouth daily.  150 tablet  2  . spironolactone (ALDACTONE) 25 MG tablet Take 0.5 tablets (12.5 mg total) by mouth daily.  30 tablet  6  . zolpidem (AMBIEN) 5 MG tablet Take 5 mg by mouth at bedtime as needed.         PHYSICAL EXAM: Filed Vitals:   12/16/11 1214  BP: 100/68  Pulse: 67   Weight change:  158 (156) General: Chronically ill appearing . No resp difficulty HEENT: normal Neck: supple. JVP to 10-11 . Carotids 2+ bilaterally; no bruits. No lymphadenopathy or thryomegaly appreciated. Cor: PMI normal. Regular rate & rhythm. No rubs, gallops or 3/6 TM. Lungs: clear Abdomen: soft, nontender, distended. No hepatosplenomegaly. No bruits or masses. Good bowel sounds. Extremities: no cyanosis, clubbing, rash, R and LLE 2+ edema Neuro: alert & orientedx3, cranial nerves grossly intact. Moves all 4 extremities w/o difficulty. Affect pleasant.   ECG: sinus rhythm  ASSESSMENT & PLAN:

## 2011-12-29 NOTE — Assessment & Plan Note (Signed)
Suspect she will have persistent PH even after valve surgery. Will get repeat echo in the near future.

## 2012-01-02 ENCOUNTER — Other Ambulatory Visit: Payer: Self-pay | Admitting: Surgical

## 2012-01-26 ENCOUNTER — Other Ambulatory Visit: Payer: Self-pay | Admitting: Surgical

## 2012-02-04 ENCOUNTER — Telehealth (HOSPITAL_COMMUNITY): Payer: Self-pay | Admitting: *Deleted

## 2012-02-04 NOTE — Telephone Encounter (Signed)
Last few days she has had increased SOB which coincides with her irregular heart rate.  Her weight has been stable.  Currently she feels her heart rate is normal and SOB has resolved.  Her daughter wanted [place] to know.  I have discussed with [name] that if she feels her HR is irregular again to stop by [employer] (closest office) and have an EKG completed.  She is agreeable to this.  Will discuss further with Dr. Gala Romney.

## 2012-02-04 NOTE — Telephone Encounter (Signed)
[name] called today. She states that she is having increased SOB, and irregular heart beat.  She is concerned that she may need to be seen soon, or have a change in medications. Please call her back.  Thanks.

## 2012-02-24 ENCOUNTER — Ambulatory Visit (HOSPITAL_COMMUNITY)
Admission: RE | Admit: 2012-02-24 | Discharge: 2012-02-24 | Disposition: A | Discharge status: Home / Self Care | Payer: Medicare Other | Source: Ambulatory Visit | Attending: Internal Medicine | Admitting: Internal Medicine

## 2012-02-24 VITALS — BP 98/66 | HR 60 | Wt 152.1 lb

## 2012-02-24 DIAGNOSIS — I509 Heart failure, unspecified: Secondary | ICD-10-CM

## 2012-02-24 DIAGNOSIS — I5032 Chronic diastolic (congestive) heart failure: Secondary | ICD-10-CM | POA: Insufficient documentation

## 2012-02-24 MED ORDER — TORSEMIDE 20 MG PO TABS: 20.0000 mg | ORAL_TABLET | Freq: Two times a day (BID) | ORAL | Status: DC

## 2012-02-24 MED ORDER — METOLAZONE 2.5 MG PO TABS: 2.5000 mg | ORAL_TABLET | ORAL | Status: DC | PRN

## 2012-02-24 NOTE — Patient Instructions (Signed)
Stop taking Furosemide  Take Torsemide 20 mg twice a day  Take Metolazone 2.5 mg Tuesday and Wednesday  Follow on Friday  Do the following things EVERYDAY: 1) Weigh yourself in the morning before breakfast. Write it down and keep it in a log. 2) Take your medicines as prescribed 3) Eat low salt foods--Limit salt (sodium) to 2000mg  per day.  4) Stay as active as you can everyday

## 2012-02-24 NOTE — Assessment & Plan Note (Addendum)
She remains markedly volume overloaded despite recent increase in diuretics. Offered hospitalization however she declines. Will stop lasix and change to Demadex 20 mg twice a day for better absorption and give Metolazone 2.5 mg for the next 2 days. Reinforced daily weights, medication compliance and limiting fluid intake to less <2 liters per day. She will follow up on Friday and if her volume status has not improved she will need hospitalization.

## 2012-02-28 ENCOUNTER — Encounter (HOSPITAL_COMMUNITY): Payer: Self-pay

## 2012-02-28 ENCOUNTER — Ambulatory Visit (HOSPITAL_COMMUNITY)
Admission: RE | Admit: 2012-02-28 | Discharge: 2012-02-28 | Disposition: A | Discharge status: Home / Self Care | Payer: Medicare Other | Source: Ambulatory Visit | Attending: Internal Medicine | Admitting: Internal Medicine

## 2012-02-28 VITALS — BP 112/68 | HR 64 | Wt 143.0 lb

## 2012-02-28 DIAGNOSIS — I5032 Chronic diastolic (congestive) heart failure: Secondary | ICD-10-CM | POA: Insufficient documentation

## 2012-02-28 DIAGNOSIS — I5042 Chronic combined systolic (congestive) and diastolic (congestive) heart failure: Secondary | ICD-10-CM

## 2012-02-28 DIAGNOSIS — I4891 Unspecified atrial fibrillation: Secondary | ICD-10-CM

## 2012-02-28 LAB — BASIC METABOLIC PANEL
Calcium: 10.4 mg/dL (ref 8.4–10.5)
Creatinine, Ser: 1.23 mg/dL — ABNORMAL HIGH (ref 0.50–1.10)
GFR calc Af Amer: 51 mL/min — ABNORMAL LOW (ref >90)
GFR calc non Af Amer: 44 mL/min — ABNORMAL LOW (ref >90)
Sodium: 138 mEq/L (ref 135–145)

## 2012-02-28 NOTE — Assessment & Plan Note (Deleted)
Volume status improved. Weight is down 9 pounds over the last 4 days. Continue Demadex 20 mg twice a day. Will check BMET today. She is instructed to decrease demadex to 20 mg daily if weight decreases to 140 pounds.  Reinforced daily weights, fluid restrictions, and low salt diet. Follow up in 2 weeks.

## 2012-02-28 NOTE — Patient Instructions (Addendum)
Decrease demadex to 20 mg daily if your weight drops to 138 pounds at home   Follow up in 2 weeks  Do the following things EVERYDAY: 1) Weigh yourself in the morning before breakfast. Write it down and keep it in a log. 2) Take your medicines as prescribed 3) Eat low salt foods--Limit salt (sodium) to 2000mg  per day.  4) Stay as active as you can everyday

## 2012-03-08 NOTE — Progress Notes (Signed)
Patient ID: [name], female   DOB: 1941/10/26, 71 y.o.   MRN: [id number] Patient ID: [name], female   DOB: 05/27/1941, 71 y.o.   MRN: [id number]  HPI:[name] is a 71 y.o.female with h/o DM2, HTN, L breast CA s/p mastectomy/chemo/XRT 8/08, PUD s/p partial gastrectomy, rheumatic fever with MS/MR/TR with related PAH, CVA with transient R sided weakness 12/11. Status post mitral valve replacement, tricuspid valve repair, and Maze procedure on November 13.   Presurgery studies: TEE showed EF 55%. RV was ok. Mild to moderate MS and moderate MR. Severe TR. LHC/RHC minimal CAD. But suggested severe MS and significant MR with related PH. RA pressure mean of 8, RV pressure of 72/6 EDP of 13, PA pressure 75/26 with a mean of 46. Pulmonary capillary wedge pressure, mean of 20 with V-waves in the 35 to 40 range. Fick CO 5.2. CI 2.7. PVR 4.3 Woods units. Mitral valve area 1.32 sq cm. Mean valve gradient 14.4 mmHg.   2/12 ECHO EF 40%. MVR stable. RV moderately-severely dialted/HK. Moderate to severe TR pRSVP 60.    Coumadin has been stopped due to falls.   She is here for follow up. She was instructed to increase Lasix 80 mg on even days and 40 mg on days and increase Spironolactone 25 mg daily.  Denies SOB/PND/. + Orthopnea. Sleeps on 10 pillows. Lower extremity edema increased. Drinks lots of fluids and eats several oranges per day.   ROS: All systems negative except as listed in HPI, PMH and Problem List.  Past Medical History  Diagnosis Date  . Pulmonary hypertension     s/p RHC 4/10 - PAP 86/29, wedge 21-23  . Gastric ulcer with hemorrhage   . Rheumatic fever     Childhood  . Coronary artery disease     Nonobstructive, LVEF 55%  . Mitral regurgitation     Moderate  . 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 Mitral stenosis with insufficiency, rheumatic   . Hypertension   . Heart murmur   . Stroke     12/11 [name],LEFT SIDE WEAKNESS  . Shortness of breath    EXERTION,PT. STATES SHE HAS PULMONARY HTN  . Pneumonia 1990'S     SEVERAL EPISODES  . Asthma     PT. STATES MILD,USES INHALERS PRN,  . Asthma     PT. ON OXYGEN 2 L/[place]   . Diabetes mellitus   . Hypothyroidism   . Anemia, pernicious 1979  . Hepatitis     PT. STATES SHE HAD IT WHEN SHE WAS 14 YRS. OLD  . Blood transfusion 2008  . Arthritis   . GERD (gastroesophageal reflux disease)   . Fibromyalgia   . Squamous cell carcinoma     Left arm  . Breast cancer     2008 s/p chemo and radiation (last tx 4/09) s/p left mastectomy s/p 9 lymph node resection - [name][facility]  . CHF (congestive heart failure) 2010    Current Outpatient Prescriptions  Medication Sig Dispense Refill  . amitriptyline (ELAVIL) 150 MG tablet Take 75 mg by mouth at bedtime.      Marland Kitchen aspirin EC 81 MG tablet Take 1 tablet (81 mg total) by mouth daily.  150 tablet  2  . cyanocobalamin (,VITAMIN B-12,) 1000 MCG/ML injection Inject 1 mL (1,000 mcg total) into the muscle every 30 (thirty) days.  1 mL  0  . folic acid (FOLVITE) 1 MG tablet Take 2 tablets (2 mg total) by mouth daily.  60 tablet  1  . hydrocodone-acetaminophen (LORCET PLUS) 7.5-650 MG per tablet Take 1 tablet by mouth every 6 (six) hours as needed. For pain  50 tablet  0  . letrozole (FEMARA) 2.5 MG tablet Take 1 tablet (2.5 mg total) by mouth daily.      Marland Kitchen levothyroxine (SYNTHROID, LEVOTHROID) 137 MCG tablet Take 1 tablet (137 mcg total) by mouth daily.      . metFORMIN (GLUCOPHAGE) 500 MG tablet Take 1 tablet (500 mg total) by mouth daily with breakfast.  30 tablet  11  . metoprolol (LOPRESSOR) 50 MG tablet Take 1 tablet (50 mg total) by mouth 2 (two) times daily.  60 tablet  1  . Multiple Vitamins-Minerals (CENTRUM SILVER) tablet Take 1 tablet by mouth daily.      . pantoprazole (PROTONIX) 40 MG tablet Take 1 tablet (40 mg total) by mouth daily.      . rosuvastatin (CRESTOR) 10 MG tablet Take 1 tablet (10 mg total) by mouth daily.      Marland Kitchen  spironolactone (ALDACTONE) 25 MG tablet Take 0.5 tablets (12.5 mg total) by mouth daily.  30 tablet  6  . zolpidem (AMBIEN) 5 MG tablet Take 5 mg by mouth at bedtime as needed.      . metolazone (ZAROXOLYN) 2.5 MG tablet Take 1 tablet (2.5 mg total) by mouth as needed.  10 tablet  6  . torsemide (DEMADEX) 20 MG tablet Take 1 tablet (20 mg total) by mouth 2 (two) times daily.  60 tablet  6     PHYSICAL EXAM: Filed Vitals:   02/24/12 1631  BP: 98/66  Pulse: 60   Weight change:  152 (155) General: Fatigued. No acute distress . No resp difficulty HEENT: normal Neck: supple. JVP to ear with prominent CV waves.  Carotids 2+ bilaterally; no bruits. No lymphadenopathy or thryomegaly appreciated. Cor: PMI normal. Irregular rate & rhythm. No rubs, gallops or 3/6 TR. Lungs:  clear Abdomen: soft, obese,  nontender, distended. No hepatosplenomegaly. No bruits or masses. Good bowel sounds. Extremities: no cyanosis, clubbing, rash, R and LLE 2+edema. R hip tender to palpation pain with external rotation Neuro: alert & orientedx3, cranial nerves grossly intact. Moves all 4 extremities w/o difficulty. Affect pleasant. No drift or asterixis.     ASSESSMENT & PLAN:

## 2012-03-09 ENCOUNTER — Encounter: Payer: Self-pay | Admitting: Internal Medicine

## 2012-03-09 DIAGNOSIS — I5042 Chronic combined systolic (congestive) and diastolic (congestive) heart failure: Secondary | ICD-10-CM | POA: Insufficient documentation

## 2012-03-09 NOTE — Assessment & Plan Note (Signed)
Overall she is doing better. She has residual biventricular dysfunction post-op with significant TR and PH. That said, her volume status and functional status are improved. Weight is down 9 pounds over the last 4 days. Continue Demadex 20 mg twice a day. Will check BMET today. She is instructed to decrease demadex to 20 mg daily if weight decreases to 140 pounds.  Reinforced daily weights, fluid restrictions, and low salt diet. Follow up in 2 weeks.

## 2012-03-09 NOTE — Assessment & Plan Note (Signed)
Maintaining SR s/p Maze. Though she is at high risk for relapse. Off anticoagulation due to recurrent falls.      

## 2012-03-09 NOTE — Progress Notes (Signed)
Patient ID: [name], female   DOB: [birth year], 71 y.o.   MRN: [id number]   HPI:[name] is a 71 y.o.female with h/o DM2, HTN, L breast CA s/p mastectomy/chemo/XRT 8/08, PUD s/p partial gastrectomy, rheumatic fever with MS/MR/TR with related PAH, CVA with transient R sided weakness 12/11. Status post mitral valve replacement, tricuspid valve repair, and Maze procedure on September 24, 2011. Coumadin stopped due to falls.  Presurgery studies: TEE showed EF 55%. RV was ok. Mild to moderate MS and moderate MR. Severe TR. LHC/RHC minimal CAD. But suggested severe MS and significant MR with related PH. RA pressure mean of 8, RV pressure of 72/6 EDP of 13, PA pressure 75/26 with a mean of 46. Pulmonary capillary wedge pressure, mean of 20 with V-waves in the 35 to 40 range. Fick CO 5.2. CI 2.7. PVR 4.3 Woods units. Mitral valve area 1.32 sq cm. Mean valve gradient 14.4 mmHg.   12/24/11  ECHO EF 40%. MVR stable. RV moderately-severely dialted/HK. Moderate to severe TR pRSVP 60.    02/24/12 Lasix stopped and Demadex 20 mg twice a day started. Also had Metolazone 2.5 mg for 2 days.   She is here for follow up. Breathing better. Mild dsypnea with exertion. +Orthopnea Denies PND/CP. Weight at home 141-149. Weight decreased 8 pounds. Wears compression stockings. Complaint with medications.  Compliant with low salt diet and fluid restrictions.    ROS: All systems negative except as listed in HPI, PMH and Problem List.  Past Medical History  Diagnosis Date  . Pulmonary hypertension     s/p RHC 4/10 - PAP 86/29, wedge 21-23  . Gastric ulcer with hemorrhage   . Rheumatic fever     Childhood  . Coronary artery disease     Nonobstructive, LVEF 55%  . Mitral regurgitation     Moderate  . 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 Mitral stenosis with insufficiency, rheumatic   . Hypertension   . Heart murmur   . Stroke     12/11 [name],LEFT SIDE WEAKNESS  . Shortness of breath    EXERTION,PT. STATES SHE HAS PULMONARY HTN  . Pneumonia 1990'S     SEVERAL EPISODES  . Asthma     PT. STATES MILD,USES INHALERS PRN,  . Asthma     PT. ON OXYGEN 2 L/[place]   . Diabetes mellitus   . Hypothyroidism   . Anemia, pernicious 1979  . Hepatitis     PT. STATES SHE HAD IT WHEN SHE WAS 14 YRS. OLD  . Blood transfusion 2008  . Arthritis   . GERD (gastroesophageal reflux disease)   . Fibromyalgia   . Squamous cell carcinoma     Left arm  . Breast cancer     2008 s/p chemo and radiation (last tx 4/09) s/p left mastectomy s/p 9 lymph node resection - [name], [facility]  . CHF (congestive heart failure) 2010    Current Outpatient Prescriptions  Medication Sig Dispense Refill  . amitriptyline (ELAVIL) 150 MG tablet Take 75 mg by mouth at bedtime.      Marland Kitchen aspirin EC 81 MG tablet Take 1 tablet (81 mg total) by mouth daily.  150 tablet  2  . cyanocobalamin (,VITAMIN B-12,) 1000 MCG/ML injection Inject 1 mL (1,000 mcg total) into the muscle every 30 (thirty) days.  1 mL  0  . folic acid (FOLVITE) 1 MG tablet Take 2 tablets (2 mg total) by mouth daily.  60 tablet  1  . hydrocodone-acetaminophen (  LORCET PLUS) 7.5-650 MG per tablet Take 1 tablet by mouth every 6 (six) hours as needed. For pain  50 tablet  0  . letrozole (FEMARA) 2.5 MG tablet Take 1 tablet (2.5 mg total) by mouth daily.      Marland Kitchen levothyroxine (SYNTHROID, LEVOTHROID) 137 MCG tablet Take 1 tablet (137 mcg total) by mouth daily.      . metFORMIN (GLUCOPHAGE) 500 MG tablet Take 1 tablet (500 mg total) by mouth daily with breakfast.  30 tablet  11  . metolazone (ZAROXOLYN) 2.5 MG tablet Take 1 tablet (2.5 mg total) by mouth as needed.  10 tablet  6  . metoprolol (LOPRESSOR) 50 MG tablet Take 1 tablet (50 mg total) by mouth 2 (two) times daily.  60 tablet  1  . Multiple Vitamins-Minerals (CENTRUM SILVER) tablet Take 1 tablet by mouth daily.      . pantoprazole (PROTONIX) 40 MG tablet Take 1 tablet (40 mg total) by mouth daily.       . rosuvastatin (CRESTOR) 10 MG tablet Take 1 tablet (10 mg total) by mouth daily.      Marland Kitchen spironolactone (ALDACTONE) 25 MG tablet Take 0.5 tablets (12.5 mg total) by mouth daily.  30 tablet  6  . torsemide (DEMADEX) 20 MG tablet Take 1 tablet (20 mg total) by mouth 2 (two) times daily.  60 tablet  6  . zolpidem (AMBIEN) 5 MG tablet Take 5 mg by mouth at bedtime as needed.         PHYSICAL EXAM: Filed Vitals:   02/28/12 1223  BP: 112/68  Pulse: 64   Weight change:  143 (152) General:  No acute distress . No resp difficulty HEENT: normal Neck: supple. JVP 7-8.  Carotids 2+ bilaterally; no bruits. No lymphadenopathy or thryomegaly appreciated. Cor: PMI normal. Mildly irregular rate & rhythm. No rubs, gallops or 3/6 TR. Lungs:  clear Abdomen: soft, obese,  nontender, distended. No hepatosplenomegaly. No bruits or masses. Good bowel sounds. Extremities: no cyanosis, clubbing, rash, R and LLE 1+edema.  Neuro: alert & orientedx3, cranial nerves grossly intact. Moves all 4 extremities w/o difficulty. Affect pleasant.      ASSESSMENT & PLAN:

## 2012-03-13 ENCOUNTER — Ambulatory Visit (HOSPITAL_COMMUNITY)
Admission: RE | Admit: 2012-03-13 | Discharge: 2012-03-13 | Disposition: A | Discharge status: Home / Self Care | Payer: Medicare Other | Source: Ambulatory Visit | Attending: Internal Medicine | Admitting: Internal Medicine

## 2012-03-13 ENCOUNTER — Encounter (HOSPITAL_COMMUNITY): Payer: Self-pay

## 2012-03-13 VITALS — BP 100/60 | HR 60 | Ht 62.0 in | Wt 149.0 lb

## 2012-03-13 DIAGNOSIS — I4891 Unspecified atrial fibrillation: Secondary | ICD-10-CM | POA: Insufficient documentation

## 2012-03-13 DIAGNOSIS — I5042 Chronic combined systolic (congestive) and diastolic (congestive) heart failure: Secondary | ICD-10-CM

## 2012-03-13 NOTE — Patient Instructions (Addendum)
Follow up in 2 weeks  Take Demadex 20 mg twice a day  Take Metolazone 2.5 mg for the next two days   Do the following things EVERYDAY: 1) Weigh yourself in the morning before breakfast. Write it down and keep it in a log. 2) Take your medicines as prescribed 3) Eat low salt foods--Limit salt (sodium) to 2000mg  per day.  4) Stay as active as you can everyday

## 2012-03-21 NOTE — Progress Notes (Signed)
Patient ID: [name], female   DOB: 08/08/1941, 71 y.o.   MRN: [id number]  PCP: Dr [name]  HPI:[name] is a 71 y.o.female with h/o DM2, HTN, L breast CA s/p mastectomy/chemo/XRT 8/08, PUD s/p partial gastrectomy, rheumatic fever with MS/MR/TR with related PAH, CVA with transient R sided weakness 12/11. Status post mitral valve replacement, tricuspid valve repair, and Maze procedure on September 24, 2011. Coumadin stopped due to falls.  Presurgery studies: TEE showed EF 55%. RV was ok. Mild to moderate MS and moderate MR. Severe TR. LHC/RHC minimal CAD. But suggested severe MS and significant MR with related PH. RA pressure mean of 8, RV pressure of 72/6 EDP of 13, PA pressure 75/26 with a mean of 46. Pulmonary capillary wedge pressure, mean of 20 with V-waves in the 35 to 40 range. Fick CO 5.2. CI 2.7. PVR 4.3 Woods units. Mitral valve area 1.32 sq cm. Mean valve gradient 14.4 mmHg.   12/24/11  ECHO EF 40%. MVR stable. RV moderately-severely dialted/HK. Moderate to severe TR pRSVP 60.    02/24/12 Lasix stopped and Demadex 20 mg twice a day started. Also had Metolazone 2.5 mg for 2 days.   She is here for follow up. She saw her PCP on Friday, and he decreased her Demadex to 20 mg daily.  Weight trending up from 139 to 148 pounds. SOB with exertion requires frequent rest breaks. Her husband reports excessive fluid intake with oranges and fluids.    ROS: All systems negative except as listed in HPI, PMH and Problem List.  Past Medical History  Diagnosis Date  . Pulmonary hypertension     s/p RHC 4/10 - PAP 86/29, wedge 21-23  . Gastric ulcer with hemorrhage   . Rheumatic fever     Childhood  . Coronary artery disease     Nonobstructive, LVEF 55%  . Mitral regurgitation     Moderate  . 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 Mitral stenosis with insufficiency, rheumatic   . Hypertension   . Heart murmur   . Stroke     12/11 [place],LEFT SIDE WEAKNESS  . Shortness of  breath     EXERTION,PT. STATES SHE HAS PULMONARY HTN  . Pneumonia 1990'S     SEVERAL EPISODES  . Asthma     PT. STATES MILD,USES INHALERS PRN,  . Asthma     PT. ON OXYGEN 2 L/[place]   . Diabetes mellitus   . Hypothyroidism   . Anemia, pernicious 1979  . Hepatitis     PT. STATES SHE HAD IT WHEN SHE WAS 14 YRS. OLD  . Blood transfusion 2008  . Arthritis   . GERD (gastroesophageal reflux disease)   . Fibromyalgia   . Squamous cell carcinoma     Left arm  . Breast cancer     2008 s/p chemo and radiation (last tx 4/09) s/p left mastectomy s/p 9 lymph node resection - [name][facility]  . CHF (congestive heart failure) 2010    Current Outpatient Prescriptions  Medication Sig Dispense Refill  . amitriptyline (ELAVIL) 150 MG tablet Take 75 mg by mouth at bedtime.      Marland Kitchen aspirin EC 81 MG tablet Take 1 tablet (81 mg total) by mouth daily.  150 tablet  2  . cyanocobalamin (,VITAMIN B-12,) 1000 MCG/ML injection Inject 1 mL (1,000 mcg total) into the muscle every 30 (thirty) days.  1 mL  0  . folic acid (FOLVITE) 1 MG tablet Take 2 tablets (  2 mg total) by mouth daily.  60 tablet  1  . hydrocodone-acetaminophen (LORCET PLUS) 7.5-650 MG per tablet Take 1 tablet by mouth every 6 (six) hours as needed. For pain  50 tablet  0  . letrozole (FEMARA) 2.5 MG tablet Take 1 tablet (2.5 mg total) by mouth daily.      Marland Kitchen levothyroxine (SYNTHROID, LEVOTHROID) 137 MCG tablet Take 150 mcg by mouth daily.      . metFORMIN (GLUCOPHAGE) 500 MG tablet Take 500 mg by mouth daily with breakfast. One in the am and two before supper      . metolazone (ZAROXOLYN) 2.5 MG tablet Take 1 tablet (2.5 mg total) by mouth as needed.  10 tablet  6  . metoprolol (LOPRESSOR) 50 MG tablet Take 1 tablet (50 mg total) by mouth 2 (two) times daily.  60 tablet  1  . Multiple Vitamins-Minerals (CENTRUM SILVER) tablet Take 1 tablet by mouth daily.      . pantoprazole (PROTONIX) 40 MG tablet Take 1 tablet (40 mg total) by mouth  daily.      . rosuvastatin (CRESTOR) 10 MG tablet Take 1 tablet (10 mg total) by mouth daily.      Marland Kitchen spironolactone (ALDACTONE) 25 MG tablet Take 0.5 tablets (12.5 mg total) by mouth daily.  30 tablet  6  . torsemide (DEMADEX) 20 MG tablet Take 1 tablet (20 mg total) by mouth 2 (two) times daily.  60 tablet  6  . zolpidem (AMBIEN) 5 MG tablet Take 5 mg by mouth at bedtime as needed.         PHYSICAL EXAM: Filed Vitals:   03/13/12 1058  BP: 100/60  Pulse: 60   Weight change:  149 (143) General:  No acute distress . No resp difficulty HEENT: normal Neck: supple. JVP to jaw  Carotids 2+ bilaterally; no bruits. No lymphadenopathy or thryomegaly appreciated. Cor: PMI normal. Mildly irregular rate & rhythm. No rubs, gallops or 3/6 TR. Lungs:  clear Abdomen: soft, obese,  nontender, distended. No hepatosplenomegaly. No bruits or masses. Good bowel sounds. Extremities: no cyanosis, clubbing, rash, R and LLE 2+edema.  Neuro: alert & orientedx3, cranial nerves grossly intact. Moves all 4 extremities w/o difficulty. Affect pleasant.      ASSESSMENT & PLAN:

## 2012-03-21 NOTE — Assessment & Plan Note (Signed)
Her volume status is up after demadex decreased. Increase demadex back to 20 bid and give metolazone for 2 days.Explained to her that we will probably need to tolerate a little bit of renal insufficiency (permissive azotemia) to keep her dry. We will follow labs closely. Reinforced need for fluid restriction, daily weights and reviewed use of sliding scale diuretics.

## 2012-03-26 ENCOUNTER — Encounter (HOSPITAL_COMMUNITY): Payer: Self-pay

## 2012-03-26 ENCOUNTER — Ambulatory Visit (HOSPITAL_COMMUNITY)
Admission: RE | Admit: 2012-03-26 | Discharge: 2012-03-26 | Disposition: A | Discharge status: Home / Self Care | Payer: Medicare Other | Source: Ambulatory Visit | Attending: Internal Medicine | Admitting: Internal Medicine

## 2012-03-26 VITALS — BP 108/66 | HR 63 | Wt 142.8 lb

## 2012-03-26 DIAGNOSIS — Z9889 Other specified postprocedural states: Secondary | ICD-10-CM

## 2012-03-26 DIAGNOSIS — I5042 Chronic combined systolic (congestive) and diastolic (congestive) heart failure: Secondary | ICD-10-CM | POA: Insufficient documentation

## 2012-03-26 MED ORDER — SPIRONOLACTONE 25 MG PO TABS: 25.0000 mg | ORAL_TABLET | Freq: Every day | ORAL | Status: DC

## 2012-03-26 NOTE — Assessment & Plan Note (Addendum)
Volume status is much improved but remains mildly elevated. Continue Torsemide 20 mg po BID. Reinforced daily weights and  fluid restricition. She is instructed to hold Demadex if her weight is less than 138 pounds. Reveiwed BMET from 03/18/12.(potassium 3.3) Increase Spironolactone to 25 mg daily. Repeat BMET next week at Dr Maple Mirza office.  Follow up in 1 month.   Patient seen and examined with Tonye Becket, NP. We discussed all aspects of the encounter. I agree with the assessment and plan as stated above. Overall much improved. Still struggling with right heart failure though and this will require constant vigilance to control. Reinforced need for daily weights and reviewed use of sliding scale diuretics.

## 2012-03-26 NOTE — Patient Instructions (Signed)
Take Spironolactone 25 mg daily  Follow up in 1 month  Do the following things EVERYDAY: 1) Weigh yourself in the morning before breakfast. Write it down and keep it in a log. 2) Take your medicines as prescribed 3) Eat low salt foods--Limit salt (sodium) to 2000 mg per day.  4) Stay as active as you can everyday 5) Limit all fluids for the day to less than 2 liters

## 2012-03-31 ENCOUNTER — Encounter (HOSPITAL_COMMUNITY): Payer: Self-pay

## 2012-04-11 NOTE — Progress Notes (Signed)
Patient ID: [name], female   DOB: 05-01-1941, 71 y.o.   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 Status post mitral valve replacement, tricuspid valve repair, and Maze procedure on September 24, 2011. Coumadin stopped due to falls.  Presurgery studies: TEE showed EF 55%. RV was ok. Mild to moderate MS and moderate MR. Severe TR. LHC/RHC minimal CAD. But suggested severe MS and significant MR with related PH. RA pressure mean of 8, RV pressure of 72/6 EDP of 13, PA pressure 75/26 with a mean of 46. Pulmonary capillary wedge pressure, mean of 20 with V-waves in the 35 to 40 range. Fick CO 5.2. CI 2.7. PVR 4.3 Woods units. Mitral valve area 1.32 sq cm. Mean valve gradient 14.4 mmHg.   12/24/11  ECHO EF 40%. MVR stable. RV moderately-severely dialted/HK. Moderate to severe TR pRSVP 60.    02/24/12 Lasix stopped and Demadex 20 mg twice a day started. Also had Metolazone 2.5 mg for 2 days.   5/813 Potassium 3.3  Creatinine 1.1 She is here for follow up. Breathing better. Denies SOB/PND/Orthopnea. Complains of fatigue. Weight at home 140-144.  She has been able to reduce fluid intake. Following low salt diet. Lower extremity edema improved. Uses rolling walker. She has completed a 5 day course of Cipro for UTI.     ROS: All systems negative except as listed in HPI, PMH and Problem List.  Past Medical History  Diagnosis Date  . Pulmonary hypertension     s/p RHC 4/10 - PAP 86/29, wedge 21-23  . Gastric ulcer with hemorrhage   . Rheumatic fever     Childhood  . Coronary artery disease     Nonobstructive, LVEF 55%  . Mitral regurgitation     Moderate  . 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 Mitral stenosis with insufficiency, rheumatic   . Hypertension   . Heart murmur   .  Stroke     12/11 [place],LEFT SIDE WEAKNESS  . Shortness of breath     EXERTION,PT. STATES SHE HAS PULMONARY HTN  . Pneumonia 1990'S     SEVERAL EPISODES  . Asthma     PT. STATES MILD,USES INHALERS PRN,  . Asthma     PT. ON OXYGEN 2 L/[place]   . Diabetes mellitus   . Hypothyroidism   . Anemia, pernicious 1979  . Hepatitis     PT. STATES SHE HAD IT WHEN SHE WAS 14 YRS. OLD  . Blood transfusion 2008  . Arthritis   . GERD (gastroesophageal reflux disease)   . Fibromyalgia   . Squamous cell carcinoma     Left arm  . Breast cancer     2008 s/p chemo and radiation (last tx 4/09) s/p left mastectomy s/p 9 lymph node resection - [name][facility]  . CHF (congestive heart failure) 2010    Current Outpatient Prescriptions  Medication Sig Dispense Refill  . amitriptyline (ELAVIL) 150 MG tablet Take 75 mg by mouth at bedtime.      Marland Kitchen aspirin EC 81 MG tablet Take 1 tablet (81 mg total) by mouth daily.  150 tablet  2  . cyanocobalamin (,VITAMIN B-12,) 1000 MCG/ML injection Inject 1 mL (1,000 mcg total) into the muscle every 30 (thirty) days.  1 mL  0  . folic  acid (FOLVITE) 1 MG tablet Take 2 tablets (2 mg total) by mouth daily.  60 tablet  1  . hydrocodone-acetaminophen (LORCET PLUS) 7.5-650 MG per tablet Take 1 tablet by mouth every 6 (six) hours as needed. For pain  50 tablet  0  . letrozole (FEMARA) 2.5 MG tablet Take 1 tablet (2.5 mg total) by mouth daily.      Marland Kitchen levothyroxine (SYNTHROID, LEVOTHROID) 137 MCG tablet Take 150 mcg by mouth daily.      . metFORMIN (GLUCOPHAGE) 500 MG tablet Take 500 mg by mouth daily with breakfast. One in the am and two before supper      . metolazone (ZAROXOLYN) 2.5 MG tablet Take 1 tablet (2.5 mg total) by mouth as needed.  10 tablet  6  . metoprolol (LOPRESSOR) 50 MG tablet Take 1 tablet (50 mg total) by mouth 2 (two) times daily.  60 tablet  1  . Multiple Vitamins-Minerals (CENTRUM SILVER) tablet Take 1 tablet by mouth daily.      . pantoprazole  (PROTONIX) 40 MG tablet Take 1 tablet (40 mg total) by mouth daily.      . rosuvastatin (CRESTOR) 10 MG tablet Take 1 tablet (10 mg total) by mouth daily.      Marland Kitchen spironolactone (ALDACTONE) 25 MG tablet Take 1 tablet (25 mg total) by mouth daily.  30 tablet  6  . torsemide (DEMADEX) 20 MG tablet Take 1 tablet (20 mg total) by mouth 2 (two) times daily.  60 tablet  6  . zolpidem (AMBIEN) 5 MG tablet Take 5 mg by mouth at bedtime as needed.         PHYSICAL EXAM: Filed Vitals:   03/26/12 1437  BP: 108/66  Pulse: 63   Weight change:  149 (143) General:  No acute distress . No resp difficulty HEENT: normal Neck: supple. JVP to jaw  Carotids 2+ bilaterally; no bruits. No lymphadenopathy or thryomegaly appreciated. Cor: PMI normal. Mildly irregular rate & rhythm. No rubs, gallops or 3/6 TR. Lungs:  clear Abdomen: soft, obese,  nontender, distended. No hepatosplenomegaly. No bruits or masses. Good bowel sounds. Extremities: no cyanosis, clubbing, rash, R and LLE 2+edema.  Neuro: alert & orientedx3, cranial nerves grossly intact. Moves all 4 extremities w/o difficulty. Affect pleasant.      ASSESSMENT & PLAN:

## 2012-04-11 NOTE — Assessment & Plan Note (Signed)
She has residual moderate-severe TR s/p TVR. Reinforced need for daily weights and reviewed use of sliding scale diuretics.

## 2012-04-20 ENCOUNTER — Ambulatory Visit: Payer: Medicare Other | Admitting: Thoracic Surgery (Cardiothoracic Vascular Surgery)

## 2012-04-27 ENCOUNTER — Encounter (HOSPITAL_COMMUNITY): Payer: Self-pay

## 2012-04-28 ENCOUNTER — Encounter (HOSPITAL_COMMUNITY): Payer: Self-pay

## 2012-04-28 ENCOUNTER — Telehealth (HOSPITAL_COMMUNITY): Payer: Self-pay | Admitting: *Deleted

## 2012-04-28 NOTE — Telephone Encounter (Signed)
[name] called today in regards to her mom. She needs to reschedule her appt, however, her mom needs to have a BMET.  We can call her PCP, Dr Darius Bump at [phone] to set up to have her labs drawn there. Please call her back once this is all set up. Thank you.

## 2012-04-29 ENCOUNTER — Ambulatory Visit (HOSPITAL_COMMUNITY): Payer: Medicare Other

## 2012-05-01 NOTE — Telephone Encounter (Signed)
Per Dr Harrison Mons office pt can come by any day next week for labs, order faxed to them at [phone] for bmet, pt's daughter is aware

## 2012-05-13 ENCOUNTER — Encounter (HOSPITAL_COMMUNITY): Payer: Self-pay

## 2012-05-25 ENCOUNTER — Encounter: Payer: Self-pay | Admitting: Thoracic Surgery (Cardiothoracic Vascular Surgery)

## 2012-05-25 ENCOUNTER — Ambulatory Visit (INDEPENDENT_AMBULATORY_CARE_PROVIDER_SITE_OTHER): Payer: Medicare Other | Admitting: Thoracic Surgery (Cardiothoracic Vascular Surgery)

## 2012-05-25 VITALS — BP 101/51 | HR 62 | Resp 20 | Ht 62.0 in | Wt 142.0 lb

## 2012-05-25 DIAGNOSIS — Z9889 Other specified postprocedural states: Secondary | ICD-10-CM

## 2012-05-25 DIAGNOSIS — Z8679 Personal history of other diseases of the circulatory system: Secondary | ICD-10-CM

## 2012-05-25 DIAGNOSIS — Z952 Presence of prosthetic heart valve: Secondary | ICD-10-CM

## 2012-05-25 DIAGNOSIS — Z954 Presence of other heart-valve replacement: Secondary | ICD-10-CM

## 2012-05-25 NOTE — Progress Notes (Signed)
[address]            [name] [address]          [phone]     CARDIOTHORACIC SURGERY OFFICE NOTE  Referring Provider is Bensimhon, Bevelyn Buckles, MD PCP is Alton Revere, MD   HPI:  Patient returns for followup status post mitral valve replacement, tricuspid valve repair, and Maze procedure on 09/24/2011.  She was last seen here in the office 12/16/2011. Since then she has continued to followup carefully with Dr. Gala Romney for management of chronic right heart failure. From a respiratory standpoint the patient has remained quite stable and she is no longer using home oxygen therapy. Functionally she gets along about as well as she ever has. She has remained quite stable over last 2 months with outpatient therapy for chronic right heart failure. She's not had any tachypalpitations or dizzy spells. She's not had any chest pain.   Current Outpatient Prescriptions  Medication Sig Dispense Refill  . amitriptyline (ELAVIL) 150 MG tablet Take 75 mg by mouth at bedtime.      Marland Kitchen aspirin EC 81 MG tablet Take 1 tablet (81 mg total) by mouth daily.  150 tablet  2  . cyanocobalamin (,VITAMIN B-12,) 1000 MCG/ML injection Inject 1 mL (1,000 mcg total) into the muscle every 30 (thirty) days.  1 mL  0  . folic acid (FOLVITE) 1 MG tablet Take 2 tablets (2 mg total) by mouth daily.  60 tablet  1  . hydrocodone-acetaminophen (LORCET PLUS) 7.5-650 MG per tablet Take 1 tablet by mouth every 6 (six) hours as needed. For pain  50 tablet  0  . letrozole (FEMARA) 2.5 MG tablet Take 1 tablet (2.5 mg total) by mouth daily.      Marland Kitchen levothyroxine (SYNTHROID, LEVOTHROID) 137 MCG tablet Take 150 mcg by mouth daily.      . metFORMIN (GLUCOPHAGE) 500 MG tablet Take 500 mg by mouth daily with breakfast. One in the am and two before supper      . metolazone (ZAROXOLYN) 2.5 MG tablet Take 2.5 mg by mouth as needed. HOLD medication if WT. Drops below 138 LBS      . metoprolol (LOPRESSOR) 50 MG  tablet Take 1 tablet (50 mg total) by mouth 2 (two) times daily.  60 tablet  1  . Multiple Vitamins-Minerals (CENTRUM SILVER) tablet Take 1 tablet by mouth daily.      . pantoprazole (PROTONIX) 40 MG tablet Take 1 tablet (40 mg total) by mouth daily.      . rosuvastatin (CRESTOR) 10 MG tablet Take 1 tablet (10 mg total) by mouth daily.      Marland Kitchen spironolactone (ALDACTONE) 25 MG tablet Take 1 tablet (25 mg total) by mouth daily.  30 tablet  6  . torsemide (DEMADEX) 20 MG tablet Take 20 mg by mouth 2 (two) times daily. IF WT drops below 138 lbs, Hold medication      . zolpidem (AMBIEN) 5 MG tablet Take 5 mg by mouth at bedtime as needed.      Marland Kitchen DISCONTD: metolazone (ZAROXOLYN) 2.5 MG tablet Take 1 tablet (2.5 mg total) by mouth as needed.  10 tablet  6  . DISCONTD: torsemide (DEMADEX) 20 MG tablet Take 1 tablet (20 mg total) by mouth 2 (two) times daily.  60 tablet  6      Physical Exam:   BP 101/51  Pulse 62  Resp 20  Ht 5\' 2"  (1.575 m)  Wt 142 lb (64.411 kg)  BMI 25.97 kg/m2  SpO2 94%  General:  Frail appearing  Chest:   Clear to auscultation  CV:   Regular rate and rhythm with grade 3/6 holosystolic murmur  Incisions:  Completely healed  Abdomen:  Soft nontender, no obvious ascites  Extremities:  Mild bilateral lower extremity edema  Diagnostic Tests:  2 channel telemetry rhythm strip demonstrates what appears to be junctional rhythm.   [facility]* *[facility]* [address] [place], [place] [address] [phone]  ------------------------------------------------------------ Transthoracic Echocardiography  Patient: [name], [name] MR #: [id number] Study Date: 12/24/2011 Gender: F Age: 71 Height: Weight: BSA: Pt. Status: Room:  PERFORMING [place], [facility] SONOGRAPHER [facility], [facility] ATTENDING Clegg, Amy ORDERING Clegg,  Amy cc:  ------------------------------------------------------------  ------------------------------------------------------------ Indications: CHF - 428.0.  ------------------------------------------------------------ History: PMH: Dyspnea. Atrial fibrillation. Coronary artery disease. Mitral valve disease. Stroke. Complications of prior valve surgery.  ------------------------------------------------------------ Study Conclusions  - Left ventricle: There is paradoxical septal motion. The EF is in the 40% range. There is inferior hypokinesis. The cavity size was normal. Wall thickness was normal. - Aortic valve: Trivial regurgitation. - Mitral valve: There is a tissue mitral prosthesis that seems to be working well. - Left atrium: The atrium was mildly dilated. - Right ventricle: The cavity size was moderately to severely dilated. Systolic function was moderately to severely reduced. - Right atrium: The atrium was moderately dilated. - Tricuspid valve: Moderate-severe regurgitation. - Pulmonary arteries: PA peak pressure: 58mm Hg (S). - Impressions: Since the study of 10/07/2011, the septal motion is more paradoxical. This leads to a lower EF estimate. Also, I think RV function is a little worse. There is more TR. Impressions:  - Since the study of 10/07/2011, the septal motion is more paradoxical. This leads to a lower EF estimate. Also, I think RV function is a little worse. There is more TR.  ------------------------------------------------------------ Labs, prior tests, procedures, and surgery: (November 2012). Mitral valve replacement. Tricuspid valve repair. Transthoracic echocardiography. M-mode, complete 2D, spectral Doppler, and color Doppler. Patient status: Outpatient. Location: Echo laboratory.  ------------------------------------------------------------  ------------------------------------------------------------ Left ventricle: There is paradoxical  septal motion. The EF is in the 40% range. There is inferior hypokinesis. The cavity size was normal. Wall thickness was normal.  ------------------------------------------------------------ Aortic valve: The valve appears to be grossly normal. Doppler: Trivial regurgitation.  ------------------------------------------------------------ Aorta: Aortic root: The aortic root was normal in size.  ------------------------------------------------------------ Mitral valve: There is a tissue mitral prosthesis that seems to be working well. Doppler: Valve area by pressure half-time: 4cm^2. Mean gradient: 4mm Hg (D). Peak gradient: 12mm Hg (D).  ------------------------------------------------------------ Left atrium: The atrium was mildly dilated.  ------------------------------------------------------------ Right ventricle: The cavity size was moderately to severely dilated. Systolic function was moderately to severely reduced.  ------------------------------------------------------------ Pulmonic valve: The valve appears to be grossly normal. Doppler: No significant regurgitation.  ------------------------------------------------------------ Tricuspid valve: Doppler: Moderate-severe regurgitation.  ------------------------------------------------------------ Right atrium: The atrium was moderately dilated.  ------------------------------------------------------------ Pericardium: There was no pericardial effusion.  ------------------------------------------------------------  2D measurements Normal Doppler measurements Normal Left ventricle Main pulmonary LVID ED, 42 mm 43-52 artery chord, PLAX Pressure, S 58 mm =30 LVID ES, 38 mm 23-38 Hg chord, PLAX Mitral valve FS, chord, 10 % >29 Mean vel, D 82.5 cm/s ------ PLAX Pressure 55 ms ------ LVPW, ED 8 mm ------ half-time IVS/LVPW 1 <1.3 Mean 4 mm ------ ratio, ED gradient, D Hg Vol  ED, MOD1 80 ml ------ Peak 12 mm  ------ Vol ES, MOD1 39 ml ------ gradient, D Hg EF, MOD1 51 % ------ Area (PHT) 4 cm^2 ------ Vol ED, MOD2 73 ml ------ Annulus VTI 41.7 cm ------ Vol ES, MOD2 35 ml ------ Tricuspid valve EF, MOD2 52 % ------ Regurg peak 347 cm/s ------ Stroke vol, 38 ml ------ vel MOD2 Peak RV-RA 48 mm ------ Ventricular septum gradient, S Hg IVS, ED 8 mm ------ Systemic veins Aorta Estimated 10 mm ------ Root diam, 26 mm ------ CVP Hg ED Right ventricle Left atrium Pressure, S 58 mm <30 AP dim 41 mm ------ Hg  ------------------------------------------------------------ Prepared and Electronically Authenticated by  Willa Rough, MD, [facility] 2013-02-12T12:22:18.393    Impression:  The patient remains clinically stable more than 6 months status post mitral valve replacement, tricuspid valve pair, and Maze procedure. She does have moderate to severe residual tricuspid regurgitation and chronic right heart failure. She is clinically stable and her rhythm is stable.   Plan:  We will have the patient return in 6 months for followup and rhythm check.   Salvatore Decent. Cornelius Moras, MD 05/25/2012 3:17 PM

## 2012-06-04 ENCOUNTER — Ambulatory Visit (HOSPITAL_COMMUNITY)
Admission: RE | Admit: 2012-06-04 | Discharge: 2012-06-04 | Disposition: A | Discharge status: Home / Self Care | Payer: Medicare Other | Source: Ambulatory Visit | Attending: Internal Medicine | Admitting: Internal Medicine

## 2012-06-04 VITALS — BP 129/80 | HR 69 | Ht 62.0 in | Wt 144.4 lb

## 2012-06-04 DIAGNOSIS — I5042 Chronic combined systolic (congestive) and diastolic (congestive) heart failure: Secondary | ICD-10-CM | POA: Insufficient documentation

## 2012-06-04 DIAGNOSIS — I5082 Biventricular heart failure: Secondary | ICD-10-CM | POA: Insufficient documentation

## 2012-06-04 DIAGNOSIS — I509 Heart failure, unspecified: Secondary | ICD-10-CM

## 2012-06-04 DIAGNOSIS — I4891 Unspecified atrial fibrillation: Secondary | ICD-10-CM | POA: Insufficient documentation

## 2012-06-04 LAB — BASIC METABOLIC PANEL
Calcium: 11.9 mg/dL — ABNORMAL HIGH (ref 8.4–10.5)
GFR calc Af Amer: 44 mL/min — ABNORMAL LOW (ref >90)
GFR calc non Af Amer: 38 mL/min — ABNORMAL LOW (ref >90)
Glucose, Bld: 107 mg/dL — ABNORMAL HIGH (ref 70–99)
Potassium: 4.1 mEq/L (ref 3.5–5.1)
Sodium: 134 mEq/L — ABNORMAL LOW (ref 135–145)

## 2012-06-04 NOTE — Progress Notes (Signed)
Patient ID: [name], female   DOB: May 30, 1941, 71 y.o.   MRN: [id number]  PCP: Dr Darius Bump  HPI: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 Status post mitral valve replacement, tricuspid valve repair, and Maze procedure on September 24, 2011. Coumadin stopped due to falls.  Presurgery studies: TEE showed EF 55%. RV was ok. Mild to moderate MS and moderate MR. Severe TR. LHC/RHC minimal CAD. But suggested severe MS and significant MR with related PH. RA pressure mean of 8, RV pressure of 72/6 EDP of 13, PA pressure 75/26 with a mean of 46. Pulmonary capillary wedge pressure, mean of 20 with V-waves in the 35 to 40 range. Fick CO 5.2. CI 2.7. PVR 4.3 Woods units. Mitral valve area 1.32 sq cm. Mean valve gradient 14.4 mmHg.   12/24/11  ECHO EF 40%. MVR stable. RV moderately-severely dialted/HK. Moderate to severe TR pRSVP 60.    02/24/12 Lasix stopped and Demadex 20 mg twice a day started. Also had Metolazone 2.5 mg for 2 days.   03/18/12 Potassium 3.3  Creatinine 1.1  She is here for follow up. Doing well from a cardiac perspective. Struggling with arthritis and fibromyalgia. Weight daily and using sliding scale diuretics. Weight typically 136-140. When weight goes up takes metolazone. Weight was up today so took metolazone. Watching fluid intake. Continues with some DOE. Occasional edema.  Denies PND/Orthopnea. Uses rolling walker.   ROS: All systems negative except as listed in HPI, PMH and Problem List.  Past Medical History  Diagnosis Date  . Pulmonary hypertension     s/p RHC 4/10 - PAP 86/29, wedge 21-23  . Gastric ulcer with hemorrhage   . Rheumatic fever     Childhood  . Coronary artery disease     Nonobstructive, LVEF 55%  . Mitral regurgitation     Moderate  . 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  Mitral stenosis with insufficiency, rheumatic   . Hypertension   . Heart murmur   . Stroke     12/11 [place],LEFT SIDE WEAKNESS  . Shortness of breath     EXERTION,PT. STATES SHE HAS PULMONARY HTN  . Pneumonia 1990'S     SEVERAL EPISODES  . Asthma     PT. STATES MILD,USES INHALERS PRN,  . Asthma     PT. ON OXYGEN 2 L/[place]   . Diabetes mellitus   . Hypothyroidism   . Anemia, pernicious 1979  . Hepatitis     PT. STATES SHE HAD IT WHEN SHE WAS 14 YRS. OLD  . Blood transfusion 2008  . Arthritis   . GERD (gastroesophageal reflux disease)   . Fibromyalgia   . Squamous cell carcinoma     Left arm  . Breast cancer     2008 s/p chemo and radiation (last tx 4/09) s/p left mastectomy s/p 9 lymph node resection - [name][facility]  . CHF (congestive heart failure) 2010    Current Outpatient Prescriptions  Medication Sig Dispense Refill  . amitriptyline (ELAVIL) 150 MG tablet Take 75 mg by mouth at bedtime.      Marland Kitchen aspirin EC 81 MG tablet Take 1 tablet (81 mg total) by mouth daily.  150 tablet  2  . cyanocobalamin (,VITAMIN B-12,) 1000 MCG/ML injection Inject 1 mL (1,000 mcg total) into the muscle every 30 (thirty) days.  1  mL  0  . folic acid (FOLVITE) 1 MG tablet Take 2 tablets (2 mg total) by mouth daily.  60 tablet  1  . hydrocodone-acetaminophen (LORCET PLUS) 7.5-650 MG per tablet Take 1 tablet by mouth every 6 (six) hours as needed. For pain  50 tablet  0  . letrozole (FEMARA) 2.5 MG tablet Take 1 tablet (2.5 mg total) by mouth daily.      Marland Kitchen levothyroxine (SYNTHROID, LEVOTHROID) 137 MCG tablet Take 150 mcg by mouth daily.      . metFORMIN (GLUCOPHAGE) 500 MG tablet Take 500 mg by mouth daily with breakfast. One in the am and two before supper      . metolazone (ZAROXOLYN) 2.5 MG tablet Take 2.5 mg by mouth as needed. HOLD medication if WT. Drops below 138 LBS      . metoprolol (LOPRESSOR) 50 MG tablet Take 1 tablet (50 mg total) by mouth 2 (two) times daily.  60 tablet  1    . Multiple Vitamins-Minerals (CENTRUM SILVER) tablet Take 1 tablet by mouth daily.      . pantoprazole (PROTONIX) 40 MG tablet Take 1 tablet (40 mg total) by mouth daily.      . rosuvastatin (CRESTOR) 10 MG tablet Take 1 tablet (10 mg total) by mouth daily.      Marland Kitchen spironolactone (ALDACTONE) 25 MG tablet Take 1 tablet (25 mg total) by mouth daily.  30 tablet  6  . torsemide (DEMADEX) 20 MG tablet Take 20 mg by mouth 2 (two) times daily. IF WT drops below 138 lbs, Hold medication      . zolpidem (AMBIEN) 5 MG tablet Take 5 mg by mouth at bedtime as needed.         PHYSICAL EXAM: Filed Vitals:   06/04/12 1057  BP: 129/80  Pulse: 69   Weight change:  144 (142) General:  No acute distress . No resp difficulty HEENT: normal Neck: supple. JVP to jaw  Carotids 2+ bilaterally; no bruits. No lymphadenopathy or thryomegaly appreciated. Cor: PMI normal. Mildly irregular rate & rhythm. No rubs, gallops or 3/6 TR. Lungs:  clear Abdomen: soft, obese,  nontender, mildly distended. No hepatosplenomegaly. No bruits or masses. Good bowel sounds. Extremities: no cyanosis, clubbing, rash, R and LLE tr-1+edema.  Neuro: alert & orientedx3, cranial nerves grossly intact. Moves all 4 extremities w/o difficulty. Affect pleasant.   ECG: Sinus 60 RBBB/LAFB/LVH    ASSESSMENT & PLAN:

## 2012-06-04 NOTE — Patient Instructions (Addendum)
Your physician has requested that you have an echocardiogram. Echocardiography is a painless test that uses sound waves to create images of your heart. It provides your doctor with information about the size and shape of your heart and how well your heart's chambers and valves are working. This procedure takes approximately one hour. There are no restrictions for this procedure.  Your physician recommends that you schedule a follow-up appointment in: 2 months with Dr Bensimhon   

## 2012-06-04 NOTE — Assessment & Plan Note (Addendum)
Doing fairly well. Still struggling with some R-sided HF with residual PH and significant TR. Reinforced need for daily weights and reviewed use of sliding scale diuretics in detail to keep weight < 140. Will repeat echo to re-evaluate RV and PA pressures. Given relatively normal PVR, I don't think PDE-5 inhibitor will do much good. Discussed possible initiation of ACE/ARB but BP has been to low. IF EF still down will try to add on at next visit if possible.

## 2012-08-05 ENCOUNTER — Encounter (HOSPITAL_COMMUNITY): Payer: Self-pay | Admitting: *Deleted

## 2012-08-05 ENCOUNTER — Ambulatory Visit (HOSPITAL_COMMUNITY)
Admission: RE | Admit: 2012-08-05 | Discharge: 2012-08-05 | Disposition: A | Discharge status: Home / Self Care | Payer: Medicare Other | Source: Ambulatory Visit | Attending: Internal Medicine | Admitting: Internal Medicine

## 2012-08-05 ENCOUNTER — Encounter (HOSPITAL_COMMUNITY): Payer: Self-pay

## 2012-08-05 ENCOUNTER — Ambulatory Visit (HOSPITAL_BASED_OUTPATIENT_CLINIC_OR_DEPARTMENT_OTHER)
Admission: RE | Admit: 2012-08-05 | Discharge: 2012-08-05 | Disposition: A | Discharge status: Home / Self Care | Payer: Medicare Other | Source: Ambulatory Visit | Attending: Internal Medicine | Admitting: Internal Medicine

## 2012-08-05 VITALS — BP 124/68 | HR 69 | Ht 62.0 in | Wt 145.4 lb

## 2012-08-05 DIAGNOSIS — I079 Rheumatic tricuspid valve disease, unspecified: Secondary | ICD-10-CM | POA: Insufficient documentation

## 2012-08-05 DIAGNOSIS — I359 Nonrheumatic aortic valve disorder, unspecified: Secondary | ICD-10-CM | POA: Insufficient documentation

## 2012-08-05 DIAGNOSIS — I509 Heart failure, unspecified: Secondary | ICD-10-CM

## 2012-08-05 DIAGNOSIS — I5042 Chronic combined systolic (congestive) and diastolic (congestive) heart failure: Secondary | ICD-10-CM | POA: Insufficient documentation

## 2012-08-05 DIAGNOSIS — I5082 Biventricular heart failure: Secondary | ICD-10-CM

## 2012-08-05 DIAGNOSIS — Z9889 Other specified postprocedural states: Secondary | ICD-10-CM

## 2012-08-05 DIAGNOSIS — I4891 Unspecified atrial fibrillation: Secondary | ICD-10-CM

## 2012-08-05 LAB — PROTIME-INR: INR: 1.14 (ref 0.00–1.49)

## 2012-08-05 LAB — BASIC METABOLIC PANEL
BUN: 51 mg/dL — ABNORMAL HIGH (ref 6–23)
CO2: 25 mEq/L (ref 19–32)
Chloride: 97 mEq/L (ref 96–112)
GFR calc Af Amer: 31 mL/min — ABNORMAL LOW (ref >90)
Potassium: 4.9 mEq/L (ref 3.5–5.1)

## 2012-08-05 LAB — CBC
HCT: 31.9 % — ABNORMAL LOW (ref 36.0–46.0)
Hemoglobin: 10.3 g/dL — ABNORMAL LOW (ref 12.0–15.0)
MCV: 87.6 fL (ref 78.0–100.0)
WBC: 9.6 10*3/uL (ref 4.0–10.5)

## 2012-08-05 NOTE — Progress Notes (Signed)
*  PRELIMINARY RESULTS* Echocardiogram 2D Echocardiogram has been performed.  [name] 08/05/2012, 11:09 AM

## 2012-08-05 NOTE — Assessment & Plan Note (Signed)
Overall doing fairly well. She is doing a nice job controlling fluid with oral diuretics. Functional status ok. However  On echo she has persistent severe TR despite previous TVR. RV is moderately dilated and HK. The major question is whether she will need a repeat TVR or not. I do worry about progressive RH failure over time. Will plan RHC next week to further evaluate and also consider if she may benefit from a pulmonary vasodilator. I will d/w Dr. Cornelius Moras.

## 2012-08-05 NOTE — Progress Notes (Signed)
Patient ID: [name], female   DOB: 20-Dec-1940, 71 y.o.   MRN: [id number]  PCP: Dr Darius Bump  HPI:Ms. 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 Status post mitral valve replacement, tricuspid valve repair, and Maze procedure on September 24, 2011. Coumadin stopped due to falls. CRI (Cr = 1.3)  Presurgery studies: TEE showed EF 55%. RV was ok. Mild to moderate MS and moderate MR. Severe TR. LHC/RHC minimal CAD. But suggested severe MS and significant MR with related PH. RA pressure mean of 8, RV pressure of 72/6 EDP of 13, PA pressure 75/26 with a mean of 46. Pulmonary capillary wedge pressure, mean of 20 with V-waves in the 35 to 40 range. Fick CO 5.2. CI 2.7. PVR 4.3 Woods units. Mitral valve area 1.32 sq cm. Mean valve gradient 14.4 mmHg.   12/24/11  ECHO EF 40%. MVR stable. RV moderately-severely dialted/HK. Moderate to severe TR pRSVP 60.    She is here for follow up. Says she is doing well from a cardiac perspective. Struggling with arthritis and fibromyalgia. Weighing daily and using sliding scale diuretics. Weight typically 140-145 (was 136-140). Says she has been eating more. Only takes metolazone every 3-4 weeks. Edema much better. Continues with some DOE but much improved from before surgery. Uses walker occasionally.  Dr. Darius Bump started her on 2.5 mg lisinopril daily. Told her calcium was quite high and supplements stopped.   Echo today which I reviewed personally. EF 45% 12/24/11  RV moderately dilated/HK.Severe TR. Flat septum.  Was on Revatio prior to surgery and could not tolerate - felt terrible. + swelling  ROS: All systems negative except as listed in HPI, PMH and Problem List.  Past Medical History  Diagnosis Date  . Pulmonary hypertension     s/p RHC 4/10 - PAP 86/29, wedge 21-23  . Gastric ulcer with hemorrhage     . Rheumatic fever     Childhood  . Coronary artery disease     Nonobstructive, LVEF 55%  . Mitral regurgitation     Moderate  . 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 Mitral stenosis with insufficiency, rheumatic   . Hypertension   . Heart murmur   . Stroke     12/11 [place],LEFT SIDE WEAKNESS  . Shortness of breath     EXERTION,PT. STATES SHE HAS PULMONARY HTN  . Pneumonia 1990'S     SEVERAL EPISODES  . Asthma     PT. STATES MILD,USES INHALERS PRN,  . Asthma     PT. ON OXYGEN 2 L/[place]   . Diabetes mellitus   . Hypothyroidism   . Anemia, pernicious 1979  . Hepatitis     PT. STATES SHE HAD IT WHEN SHE WAS 14 YRS. OLD  . Blood transfusion 2008  . Arthritis   . GERD (gastroesophageal reflux disease)   . Fibromyalgia   . Squamous cell carcinoma     Left arm  . Breast cancer     2008 s/p chemo and radiation (last tx 4/09) s/p left mastectomy s/p 9 lymph node resection - [name][facility]  . CHF (congestive heart failure) 2010    Current Outpatient Prescriptions  Medication Sig Dispense Refill  . amitriptyline (ELAVIL) 150 MG tablet Take 75 mg by mouth at bedtime.      Marland Kitchen aspirin EC 81 MG tablet Take 1 tablet (  81 mg total) by mouth daily.  150 tablet  2  . cyanocobalamin (,VITAMIN B-12,) 1000 MCG/ML injection Inject 1 mL (1,000 mcg total) into the muscle every 30 (thirty) days.  1 mL  0  . folic acid (FOLVITE) 1 MG tablet Take 2 tablets (2 mg total) by mouth daily.  60 tablet  1  . hydrocodone-acetaminophen (LORCET PLUS) 7.5-650 MG per tablet Take 1 tablet by mouth every 6 (six) hours as needed. For pain  50 tablet  0  . letrozole (FEMARA) 2.5 MG tablet Take 1 tablet (2.5 mg total) by mouth daily.      Marland Kitchen levothyroxine (SYNTHROID, LEVOTHROID) 137 MCG tablet Take 150 mcg by mouth daily.      Marland Kitchen lisinopril (PRINIVIL,ZESTRIL) 2.5 MG tablet Take 2.5 mg by mouth daily.      . metFORMIN (GLUCOPHAGE) 500 MG tablet Take 500 mg by mouth daily with breakfast. One in  the am and two before supper      . metolazone (ZAROXOLYN) 2.5 MG tablet Take 2.5 mg by mouth as needed. HOLD medication if WT. Drops below 138 LBS      . metoprolol (LOPRESSOR) 50 MG tablet Take 1 tablet (50 mg total) by mouth 2 (two) times daily.  60 tablet  1  . pantoprazole (PROTONIX) 40 MG tablet Take 1 tablet (40 mg total) by mouth daily.      . rosuvastatin (CRESTOR) 10 MG tablet Take 5 mg by mouth daily.      Marland Kitchen spironolactone (ALDACTONE) 25 MG tablet Take 1 tablet (25 mg total) by mouth daily.  30 tablet  6  . torsemide (DEMADEX) 20 MG tablet Take 20 mg by mouth 2 (two) times daily. IF WT drops below 138 lbs, Hold medication      . zolpidem (AMBIEN) 5 MG tablet Take 5 mg by mouth at bedtime as needed.      Marland Kitchen DISCONTD: rosuvastatin (CRESTOR) 10 MG tablet Take 1 tablet (10 mg total) by mouth daily.      . Multiple Vitamins-Minerals (CENTRUM SILVER) tablet Take 1 tablet by mouth daily.         PHYSICAL EXAM: Filed Vitals:   08/05/12 1123  BP: 124/68  Pulse: 69   Weight change:  144 (142) General:  No acute distress . No resp difficulty HEENT: normal Neck: supple. JVP 9  Carotids 2+ bilaterally; no bruits. No lymphadenopathy or thryomegaly appreciated. Cor: PMI normal. Mildly irregular rate & rhythm. No rubs, gallops or 3/6 TR. Loud P2. 2/6 AS murmur Lungs:  clear Abdomen: soft, obese,  nontender, mildly distended. No hepatosplenomegaly. No bruits or masses. Good bowel sounds. Extremities: no cyanosis, clubbing, rash, R and LLE tr edema.  Neuro: alert & orientedx3, cranial nerves grossly intact. Moves all 4 extremities w/o difficulty. Affect pleasant.     ASSESSMENT & PLAN:

## 2012-08-05 NOTE — Assessment & Plan Note (Signed)
sSee above.

## 2012-08-05 NOTE — Patient Instructions (Addendum)
Labs today  Cath on Monday 9/30, see instruction sheet  Your physician recommends that you schedule a follow-up appointment in: 2 months

## 2012-08-05 NOTE — Assessment & Plan Note (Signed)
Maintaining SR s/p Maze. Though she is at high risk for relapse. Off anticoagulation due to recurrent falls.      

## 2012-08-07 ENCOUNTER — Other Ambulatory Visit (HOSPITAL_COMMUNITY): Payer: Self-pay | Admitting: Physician Assistant

## 2012-08-07 DIAGNOSIS — I5022 Chronic systolic (congestive) heart failure: Secondary | ICD-10-CM

## 2012-08-10 ENCOUNTER — Encounter (HOSPITAL_COMMUNITY): Admission: RE | Payer: Self-pay | Source: Ambulatory Visit

## 2012-08-10 ENCOUNTER — Ambulatory Visit (HOSPITAL_COMMUNITY): Admission: RE | Admit: 2012-08-10 | Payer: Medicare Other | Source: Ambulatory Visit | Admitting: Internal Medicine

## 2012-08-10 SURGERY — JV RIGHT HEART CATHETERIZATION
Anesthesia: Moderate Sedation

## 2012-08-12 ENCOUNTER — Inpatient Hospital Stay (HOSPITAL_BASED_OUTPATIENT_CLINIC_OR_DEPARTMENT_OTHER)
Admission: RE | Admit: 2012-08-12 | Discharge: 2012-08-12 | Disposition: A | Discharge status: Home / Self Care | Payer: Medicare Other | Source: Ambulatory Visit | Attending: Internal Medicine | Admitting: Internal Medicine

## 2012-08-12 ENCOUNTER — Encounter (HOSPITAL_BASED_OUTPATIENT_CLINIC_OR_DEPARTMENT_OTHER)
Admission: RE | Disposition: A | Discharge status: Home / Self Care | Payer: Self-pay | Source: Ambulatory Visit | Attending: Internal Medicine

## 2012-08-12 DIAGNOSIS — Z79899 Other long term (current) drug therapy: Secondary | ICD-10-CM | POA: Insufficient documentation

## 2012-08-12 DIAGNOSIS — I509 Heart failure, unspecified: Secondary | ICD-10-CM

## 2012-08-12 DIAGNOSIS — I5022 Chronic systolic (congestive) heart failure: Secondary | ICD-10-CM

## 2012-08-12 DIAGNOSIS — E119 Type 2 diabetes mellitus without complications: Secondary | ICD-10-CM | POA: Insufficient documentation

## 2012-08-12 DIAGNOSIS — I10 Essential (primary) hypertension: Secondary | ICD-10-CM | POA: Insufficient documentation

## 2012-08-12 DIAGNOSIS — I079 Rheumatic tricuspid valve disease, unspecified: Secondary | ICD-10-CM | POA: Insufficient documentation

## 2012-08-12 DIAGNOSIS — Z853 Personal history of malignant neoplasm of breast: Secondary | ICD-10-CM | POA: Insufficient documentation

## 2012-08-12 LAB — POCT I-STAT 3, VENOUS BLOOD GAS (G3P V)
Bicarbonate: 24.6 mEq/L — ABNORMAL HIGH (ref 20.0–24.0)
Bicarbonate: 24.6 mEq/L — ABNORMAL HIGH (ref 20.0–24.0)
O2 Saturation: 63 %
TCO2: 26 mmol/L (ref 0–100)
TCO2: 26 mmol/L (ref 0–100)
pH, Ven: 7.386 — ABNORMAL HIGH (ref 7.250–7.300)
pO2, Ven: 32 mmHg (ref 30.0–45.0)
pO2, Ven: 33 mmHg (ref 30.0–45.0)

## 2012-08-12 SURGERY — JV RIGHT HEART CATHETERIZATION
Anesthesia: Moderate Sedation

## 2012-08-12 MED ORDER — ACETAMINOPHEN 325 MG PO TABS
650.0000 mg | ORAL_TABLET | ORAL | Status: DC | PRN
Start: 2012-08-12 — End: 2012-08-12

## 2012-08-12 MED ORDER — SODIUM CHLORIDE 0.9 % IV SOLN: 250.0000 mL | INTRAVENOUS | Status: DC | PRN

## 2012-08-12 MED ORDER — ONDANSETRON HCL 4 MG/2ML IJ SOLN: 4.0000 mg | Freq: Four times a day (QID) | INTRAMUSCULAR | Status: DC | PRN

## 2012-08-12 MED ORDER — SODIUM CHLORIDE 0.9 % IJ SOLN: 3.0000 mL | Freq: Two times a day (BID) | INTRAMUSCULAR | Status: DC

## 2012-08-12 MED ORDER — SODIUM CHLORIDE 0.9 % IJ SOLN: 3.0000 mL | INTRAMUSCULAR | Status: DC | PRN

## 2012-08-12 NOTE — H&P (View-Only) (Signed)
Patient ID: [name], female   DOB: 10/18/1941, 71 y.o.   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 Status post mitral valve replacement, tricuspid valve repair, and Maze procedure on September 24, 2011. Coumadin stopped due to falls. CRI (Cr = 1.3)  Presurgery studies: TEE showed EF 55%. RV was ok. Mild to moderate MS and moderate MR. Severe TR. LHC/RHC minimal CAD. But suggested severe MS and significant MR with related PH. RA pressure mean of 8, RV pressure of 72/6 EDP of 13, PA pressure 75/26 with a mean of 46. Pulmonary capillary wedge pressure, mean of 20 with V-waves in the 35 to 40 range. Fick CO 5.2. CI 2.7. PVR 4.3 Woods units. Mitral valve area 1.32 sq cm. Mean valve gradient 14.4 mmHg.   12/24/11  ECHO EF 40%. MVR stable. RV moderately-severely dialted/HK. Moderate to severe TR pRSVP 60.    She is here for follow up. Says she is doing well from a cardiac perspective. Struggling with arthritis and fibromyalgia. Weighing daily and using sliding scale diuretics. Weight typically 140-145 (was 136-140). Says she has been eating more. Only takes metolazone every 3-4 weeks. Edema much better. Continues with some DOE but much improved from before surgery. Uses walker occasionally.  [name]. Eller started her on 2.5 mg lisinopril daily. Told her calcium was quite high and supplements stopped.   Echo today which I reviewed personally. EF 45% 12/24/11  RV moderately dilated/HK.Severe TR. Flat septum.  Was on Revatio prior to surgery and could not tolerate - felt terrible. + swelling  ROS: All systems negative except as listed in HPI, PMH and Problem List.  Past Medical History  Diagnosis Date  . Pulmonary hypertension     s/p RHC 4/10 - PAP 86/29, wedge 21-23  . Gastric ulcer with hemorrhage     . Rheumatic fever     Childhood  . Coronary artery disease     Nonobstructive, LVEF 55%  . Mitral regurgitation     Moderate  . 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 Mitral stenosis with insufficiency, rheumatic   . Hypertension   . Heart murmur   . Stroke     12/11 [name],LEFT SIDE WEAKNESS  . Shortness of breath     EXERTION,PT. STATES SHE HAS PULMONARY HTN  . Pneumonia 1990'S     SEVERAL EPISODES  . Asthma     PT. STATES MILD,USES INHALERS PRN,  . Asthma     PT. ON OXYGEN 2 L/[place]   . Diabetes mellitus   . Hypothyroidism   . Anemia, pernicious 1979  . Hepatitis     PT. STATES SHE HAD IT WHEN SHE WAS 14 YRS. OLD  . Blood transfusion 2008  . Arthritis   . GERD (gastroesophageal reflux disease)   . Fibromyalgia   . Squamous cell carcinoma     Left arm  . Breast cancer     2008 s/p chemo and radiation (last tx 4/09) s/p left mastectomy s/p 9 lymph node resection - [name], [name]  . CHF (congestive heart failure) 2010    Current Outpatient Prescriptions  Medication Sig Dispense Refill  . amitriptyline (ELAVIL) 150 MG tablet Take 75 mg by mouth at bedtime.      . aspirin EC 81 MG tablet Take 1 tablet (  81 mg total) by mouth daily.  150 tablet  2  . cyanocobalamin (,VITAMIN B-12,) 1000 MCG/ML injection Inject 1 mL (1,000 mcg total) into the muscle every 30 (thirty) days.  1 mL  0  . folic acid (FOLVITE) 1 MG tablet Take 2 tablets (2 mg total) by mouth daily.  60 tablet  1  . hydrocodone-acetaminophen (LORCET PLUS) 7.5-650 MG per tablet Take 1 tablet by mouth every 6 (six) hours as needed. For pain  50 tablet  0  . letrozole (FEMARA) 2.5 MG tablet Take 1 tablet (2.5 mg total) by mouth daily.      . levothyroxine (SYNTHROID, LEVOTHROID) 137 MCG tablet Take 150 mcg by mouth daily.      . lisinopril (PRINIVIL,ZESTRIL) 2.5 MG tablet Take 2.5 mg by mouth daily.      . metFORMIN (GLUCOPHAGE) 500 MG tablet Take 500 mg by mouth daily with breakfast. One in  the am and two before supper      . metolazone (ZAROXOLYN) 2.5 MG tablet Take 2.5 mg by mouth as needed. HOLD medication if WT. Drops below 138 LBS      . metoprolol (LOPRESSOR) 50 MG tablet Take 1 tablet (50 mg total) by mouth 2 (two) times daily.  60 tablet  1  . pantoprazole (PROTONIX) 40 MG tablet Take 1 tablet (40 mg total) by mouth daily.      . rosuvastatin (CRESTOR) 10 MG tablet Take 5 mg by mouth daily.      . spironolactone (ALDACTONE) 25 MG tablet Take 1 tablet (25 mg total) by mouth daily.  30 tablet  6  . torsemide (DEMADEX) 20 MG tablet Take 20 mg by mouth 2 (two) times daily. IF WT drops below 138 lbs, Hold medication      . zolpidem (AMBIEN) 5 MG tablet Take 5 mg by mouth at bedtime as needed.      . DISCONTD: rosuvastatin (CRESTOR) 10 MG tablet Take 1 tablet (10 mg total) by mouth daily.      . Multiple Vitamins-Minerals (CENTRUM SILVER) tablet Take 1 tablet by mouth daily.         PHYSICAL EXAM: Filed Vitals:   08/05/12 1123  BP: 124/68  Pulse: 69   Weight change:  144 (142) General:  No acute distress . No resp difficulty HEENT: normal Neck: supple. JVP 9  Carotids 2+ bilaterally; no bruits. No lymphadenopathy or thryomegaly appreciated. Cor: PMI normal. Mildly irregular rate & rhythm. No rubs, gallops or 3/6 TR. Loud P2. 2/6 AS murmur Lungs:  clear Abdomen: soft, obese,  nontender, mildly distended. No hepatosplenomegaly. No bruits or masses. Good bowel sounds. Extremities: no cyanosis, clubbing, rash, R and LLE tr edema.  Neuro: alert & orientedx3, cranial nerves grossly intact. Moves all 4 extremities w/o difficulty. Affect pleasant.     ASSESSMENT & PLAN: 

## 2012-08-12 NOTE — CV Procedure (Addendum)
Cardiac Cath Procedure Note:  Indication:  Right heart failure, severe tricuspid regurgitation  Procedures performed:  1) Right heart catheterization  Description of procedure:   The risks and indication of the procedure were explained. Consent was signed and placed on the chart. An appropriate timeout was taken prior to the procedure. The right groin was prepped and draped in the routine sterile fashion and anesthetized with 1% local lidocaine.   A 7 FR venous sheath was placed in the right femoral vein using a modified Seldinger technique. A standard Swan-Ganz catheter was used for the procedure.   Complications: None apparent.  Findings:  RA = 15 v= 21 RV =  64/3/13 PA =  59/16 (33) PCW = 14 Fick cardiac output/index = 4.1/2.4 PVR = 7.8 Woods FA sat =  98% PA sat = 60%, 63%  Assessment: 1. Mid PAH with significantly elevated R-sided pressures and normal cardiac output 2. Severe TR  Plan/Discussion:  Her TR appears hemodynamically significant. Will d/w Dr. Cornelius Moras regarding possibility of TVR to prevent further RV deterioration. Prior to this may be worth a trial of pulmonary vasodilators.   [name] Zacharia,MD 12:49 PM     [name] 12:41 PM

## 2012-08-12 NOTE — Interval H&P Note (Signed)
History and Physical Interval Note:  08/12/2012 12:20 PM  [name]  has presented today for surgery, with the diagnosis of severe TR and RHF. The various methods of treatment have been discussed with the patient and family. After consideration of risks, benefits and other options for treatment, the patient has consented to  Procedure(s) (LRB) with comments: JV RIGHT HEART CATHETERIZATION (N/A) as a surgical intervention .  The patient's history has been reviewed, patient examined, no change in status, stable for surgery.  I have reviewed the patient's chart and labs.  Questions were answered to the patient's satisfaction.     Azriel Jakob

## 2012-08-12 NOTE — OR Nursing (Signed)
Tegaderm dressing applied, site level 0, bedrest begins at 1300 

## 2012-08-12 NOTE — OR Nursing (Signed)
Dr Bensimhon at bedside to discuss results and treatment plan with pt and family 

## 2012-08-13 ENCOUNTER — Encounter (HOSPITAL_BASED_OUTPATIENT_CLINIC_OR_DEPARTMENT_OTHER): Payer: Self-pay

## 2012-08-14 ENCOUNTER — Encounter (HOSPITAL_BASED_OUTPATIENT_CLINIC_OR_DEPARTMENT_OTHER): Payer: Self-pay

## 2012-08-14 ENCOUNTER — Inpatient Hospital Stay (HOSPITAL_BASED_OUTPATIENT_CLINIC_OR_DEPARTMENT_OTHER): Admit: 2012-08-14 | Payer: Medicare Other | Admitting: Internal Medicine

## 2012-08-14 SURGERY — JV RIGHT HEART CATHETERIZATION
Anesthesia: Moderate Sedation

## 2012-08-23 IMAGING — CR DG CHEST 2V
1 series · 1 of 1 positions shown · non-contrast
Comparison: 09/30/2011

CLINICAL DATA: Coronary bypass, shortness of breath

CHEST - 2 VIEW

[view not recorded]
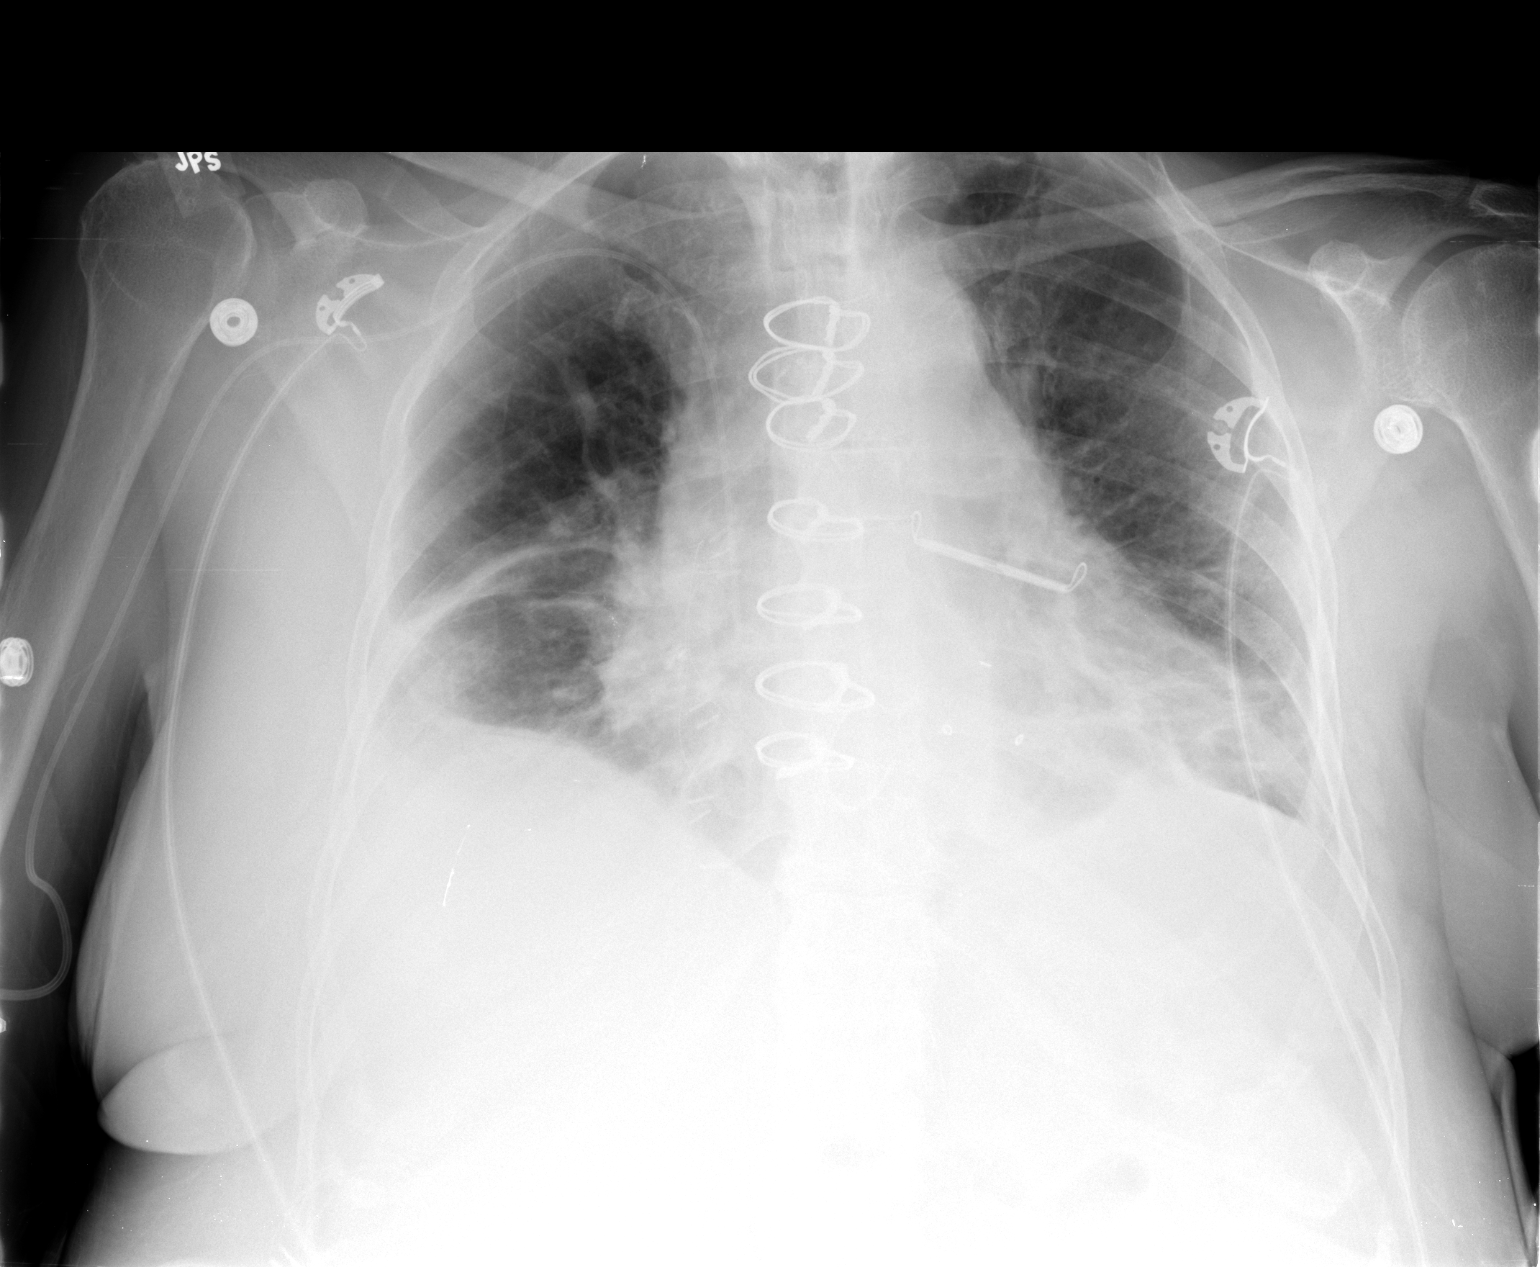

[1 of 1 positions shown; findings below may reference images not displayed]

FINDINGS: Median sternotomy changes for cardiac surgery and valve
replacement noted.  Heart remains enlarged with central vascular
congestion and persistent bibasilar atelectasis.  Small effusions
evident.  No pneumothorax demonstrated on today's study.  Right
PICC line tip in the SVC RA junction.  Trachea is midline.
IMPRESSION: Cardiomegaly with vascular congestion, basilar atelectasis and
small effusions.

No pneumothorax

Stable postoperative appearance

## 2012-08-26 ENCOUNTER — Ambulatory Visit (HOSPITAL_COMMUNITY)
Admission: RE | Admit: 2012-08-26 | Discharge: 2012-08-26 | Disposition: A | Discharge status: Home / Self Care | Payer: Medicare Other | Source: Ambulatory Visit | Attending: Internal Medicine | Admitting: Internal Medicine

## 2012-08-26 VITALS — BP 130/70 | HR 73 | Wt 148.8 lb

## 2012-08-26 DIAGNOSIS — I5032 Chronic diastolic (congestive) heart failure: Secondary | ICD-10-CM

## 2012-08-26 LAB — BASIC METABOLIC PANEL
Chloride: 104 mEq/L (ref 96–112)
GFR calc Af Amer: 40 mL/min — ABNORMAL LOW (ref >90)
GFR calc non Af Amer: 34 mL/min — ABNORMAL LOW (ref >90)
Glucose, Bld: 89 mg/dL (ref 70–99)
Potassium: 4.4 mEq/L (ref 3.5–5.1)
Sodium: 139 mEq/L (ref 135–145)

## 2012-08-26 NOTE — Assessment & Plan Note (Signed)
NYHA III. Increased dyspnea with exertion. Volume status elevated. Increase Torsemide 40 mg twice a day for the next 2 days the resume Torsemide 20 mg twice a day. Check BMET and Pro-BNP today. Reinforced daily weights, low salt food choices, and limiting fluid intake to < 2 liters per day. Follow up next week to re-check volume status.

## 2012-08-26 NOTE — Progress Notes (Signed)
Patient ID: [name], female   DOB: 12/16/1940, 71 y.o.   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 Status post mitral valve replacement, tricuspid valve repair, and Maze procedure on September 24, 2011. Coumadin stopped due to falls. CRI (Cr = 1.3)  Presurgery studies: TEE showed EF 55%. RV was ok. Mild to moderate MS and moderate MR. Severe TR. LHC/RHC minimal CAD. But suggested severe MS and significant MR with related PH. RA pressure mean of 8, RV pressure of 72/6 EDP of 13, PA pressure 75/26 with a mean of 46. Pulmonary capillary wedge pressure, mean of 20 with V-waves in the 35 to 40 range. Fick CO 5.2. CI 2.7. PVR 4.3 Woods units. Mitral valve area 1.32 sq cm. Mean valve gradient 14.4 mmHg.   12/24/11  ECHO EF 40%. MVR stable. RV moderately-severely dialted/HK. Moderate to severe TR pRSVP 60.     Echo today which I reviewed personally. EF 45% 12/24/11  RV moderately dilated/HK.Severe TR. Flat septum.  Was on Revatio prior to surgery and could not tolerate - felt terrible. + swelling  08/12/12 RHC RA = 15 v= 21  RV = 64/3/13  PA = 59/16 (33)  PCW = 14  Fick cardiac output/index = 4.1/2.4  PVR = 7.8 Woods  FA sat = 98%  PA sat = 60%, 63%  Assessment:  1. Mid PAH with significantly elevated R-sided pressures and normal cardiac output  2. Severe TR  She returns for an acute work in due to increased weight. Increased dyspnea. Denies PND/CP. Weight at home 140-144 pounds. She has had one extra Metolazone for extra weight. She has been taking Torsemide 20 mg in am and 20 mg pm. Compliant with medications. Limits fluid intake to less tah 2 liters per day.    ROS: All systems negative except as listed in HPI, PMH and Problem List.  Past Medical History  Diagnosis Date  .  Pulmonary hypertension     s/p RHC 4/10 - PAP 86/29, wedge 21-23  . Gastric ulcer with hemorrhage   . Rheumatic fever     Childhood  . Coronary artery disease     Nonobstructive, LVEF 55%  . Mitral regurgitation     Moderate  . 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 Mitral stenosis with insufficiency, rheumatic   . Hypertension   . Heart murmur   . Stroke     12/11 [place],LEFT SIDE WEAKNESS  . Shortness of breath     EXERTION,PT. STATES SHE HAS PULMONARY HTN  . Pneumonia 1990'S     SEVERAL EPISODES  . Asthma     PT. STATES MILD,USES INHALERS PRN,  . Asthma     PT. ON OXYGEN 2 L/[place]   . Diabetes mellitus   . Hypothyroidism   . Anemia, pernicious 1979  . Hepatitis     PT. STATES SHE HAD IT WHEN SHE WAS 14 YRS. OLD  . Blood transfusion 2008  . Arthritis   . GERD (gastroesophageal reflux disease)   . Fibromyalgia   . Squamous cell carcinoma     Left arm  . Breast cancer     2008 s/p chemo and radiation (last tx 4/09) s/p left mastectomy s/p 9 lymph node resection - [name], [facility]  . CHF (congestive heart failure)  2010    Current Outpatient Prescriptions  Medication Sig Dispense Refill  . amitriptyline (ELAVIL) 150 MG tablet Take 75 mg by mouth at bedtime.      Marland Kitchen aspirin EC 81 MG tablet Take 1 tablet (81 mg total) by mouth daily.  150 tablet  2  . cyanocobalamin (,VITAMIN B-12,) 1000 MCG/ML injection Inject 1 mL (1,000 mcg total) into the muscle every 30 (thirty) days.  1 mL  0  . folic acid (FOLVITE) 1 MG tablet Take 2 tablets (2 mg total) by mouth daily.  60 tablet  1  . hydrocodone-acetaminophen (LORCET PLUS) 7.5-650 MG per tablet Take 1 tablet by mouth every 6 (six) hours as needed. For pain  50 tablet  0  . letrozole (FEMARA) 2.5 MG tablet Take 1 tablet (2.5 mg total) by mouth daily.      Marland Kitchen levothyroxine (SYNTHROID, LEVOTHROID) 137 MCG tablet Take 150 mcg by mouth daily.      Marland Kitchen lisinopril (PRINIVIL,ZESTRIL) 2.5 MG tablet Take 2.5 mg by mouth  daily.      . metFORMIN (GLUCOPHAGE) 500 MG tablet Take 500 mg by mouth daily with breakfast. One in the am and two before supper      . metolazone (ZAROXOLYN) 2.5 MG tablet Take 2.5 mg by mouth as needed. HOLD medication if WT. Drops below 138 LBS      . metoprolol (LOPRESSOR) 50 MG tablet Take 1 tablet (50 mg total) by mouth 2 (two) times daily.  60 tablet  1  . Multiple Vitamins-Minerals (CENTRUM SILVER) tablet Take 1 tablet by mouth daily.      . pantoprazole (PROTONIX) 40 MG tablet Take 1 tablet (40 mg total) by mouth daily.      . rosuvastatin (CRESTOR) 10 MG tablet Take 5 mg by mouth daily.      Marland Kitchen spironolactone (ALDACTONE) 25 MG tablet Take 1 tablet (25 mg total) by mouth daily.  30 tablet  6  . torsemide (DEMADEX) 20 MG tablet Take 20 mg by mouth 2 (two) times daily. IF WT drops below 138 lbs, Hold medication      . zolpidem (AMBIEN) 5 MG tablet Take 5 mg by mouth at bedtime as needed.         PHYSICAL EXAM: Filed Vitals:   08/26/12 1346  BP: 130/70  Pulse: 73   Weight change:  148 (144 pounds) General:  Chronically ill appearing .No acute distress . No resp difficulty Daughter present HEENT: normal Neck: supple. JVP to jaw. Carotids 2+ bilaterally; no bruits. No lymphadenopathy or thryomegaly appreciated. Cor: PMI normal. Mildly irregular rate & rhythm. No rubs, gallops or 3/6 TR. Loud P2. 2/6 AS murmur Lungs:  clear Abdomen: soft, obese,  nontender,  distended. No hepatosplenomegaly. No bruits or masses. Good bowel sounds. Extremities: no cyanosis, clubbing, rash, R and LLE tr edema.  Neuro: alert & orientedx3, cranial nerves grossly intact. Moves all 4 extremities w/o difficulty. Affect pleasant.     ASSESSMENT & PLAN:

## 2012-08-26 NOTE — Patient Instructions (Addendum)
Take Torsemide 40 mg (2 tabs ) in the am and pm for the next 2 days. (Thursday and Friday)  Check BMET next week  Follow up next week  Do the following things EVERYDAY: 1) Weigh yourself in the morning before breakfast. Write it down and keep it in a log. 2) Take your medicines as prescribed 3) Eat low salt foods-Limit salt (sodium) to 2000 mg per day.  4) Stay as active as you can everyday 5) Limit all fluids for the day to less than 2 liters

## 2012-09-02 ENCOUNTER — Telehealth (HOSPITAL_COMMUNITY): Payer: Self-pay | Admitting: Cardiology

## 2012-09-02 ENCOUNTER — Ambulatory Visit (HOSPITAL_COMMUNITY)
Admission: RE | Admit: 2012-09-02 | Discharge: 2012-09-02 | Disposition: A | Discharge status: Home / Self Care | Payer: Medicare Other | Source: Ambulatory Visit | Attending: Internal Medicine | Admitting: Internal Medicine

## 2012-09-02 ENCOUNTER — Encounter (HOSPITAL_COMMUNITY): Payer: Self-pay

## 2012-09-02 VITALS — BP 104/68 | HR 62 | Wt 148.4 lb

## 2012-09-02 DIAGNOSIS — I5082 Biventricular heart failure: Secondary | ICD-10-CM

## 2012-09-02 DIAGNOSIS — I509 Heart failure, unspecified: Secondary | ICD-10-CM | POA: Insufficient documentation

## 2012-09-02 LAB — BASIC METABOLIC PANEL WITH GFR
BUN: 55 mg/dL — ABNORMAL HIGH (ref 6–23)
CO2: 23 meq/L (ref 19–32)
Calcium: 8.7 mg/dL (ref 8.4–10.5)
Chloride: 103 meq/L (ref 96–112)
Creatinine, Ser: 1.75 mg/dL — ABNORMAL HIGH (ref 0.50–1.10)
GFR calc Af Amer: 33 mL/min — ABNORMAL LOW
GFR calc non Af Amer: 29 mL/min — ABNORMAL LOW
Glucose, Bld: 91 mg/dL (ref 70–99)
Potassium: 5.1 meq/L (ref 3.5–5.1)
Sodium: 138 meq/L (ref 135–145)

## 2012-09-02 MED ORDER — TORSEMIDE 20 MG PO TABS: ORAL_TABLET | ORAL | Status: DC

## 2012-09-02 NOTE — Progress Notes (Signed)
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 Status post mitral valve replacement, tricuspid valve repair, and Maze procedure on September 24, 2011. Coumadin stopped due to falls. CRI (Cr = 1.3)  Presurgery studies: TEE showed EF 55%. RV was ok. Mild to moderate MS and moderate MR. Severe TR. LHC/RHC minimal CAD. But suggested severe MS and significant MR with related PH. RA pressure mean of 8, RV pressure of 72/6 EDP of 13, PA pressure 75/26 with a mean of 46. Pulmonary capillary wedge pressure, mean of 20 with V-waves in the 35 to 40 range. Fick CO 5.2. CI 2.7. PVR 4.3 Woods units. Mitral valve area 1.32 sq cm. Mean valve gradient 14.4 mmHg.   12/24/11  ECHO EF 40%. MVR stable. RV moderately-severely dialted/HK. Moderate to severe TR pRSVP 60.    Was on Revatio prior to surgery and could not tolerate - felt terrible. + swelling  08/12/12 RHC RA = 15 v= 21  RV = 64/3/13  PA = 59/16 (33)  PCW = 14  Fick cardiac output/index = 4.1/2.4  PVR = 7.8 Woods  FA sat = 98%  PA sat = 60%, 63%  Assessment:  1. Mid PAH with significantly elevated R-sided pressures and normal cardiac output  2. Severe TR  She returns for 1 week follow up after work in visit for increased weight gain and SOB.  She is here with her daughter and husband.  Last visit demadex increased 40 mg BID for 2 days then back to 20 mg BID.  Weight improved from 148 pounds to 141 pounds but steadily has climbed over the weekend back to 146 pounds.  She feels better than she did last week with improved dyspnea but not at baseline.  Her orthopnea has returned to baseline.  Denies PND or chest pain.  Compliant with meds and diet restrictions.  Says she can still "feel her heart in her neck" when she walks.      ROS: All systems negative except as listed  in HPI, PMH and Problem List.  Past Medical History  Diagnosis Date  . Pulmonary hypertension     s/p RHC 4/10 - PAP 86/29, wedge 21-23  . Gastric ulcer with hemorrhage   . Rheumatic fever     Childhood  . Coronary artery disease     Nonobstructive, LVEF 55%  . Mitral regurgitation     Moderate  . 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 Mitral stenosis with insufficiency, rheumatic   . Hypertension   . Heart murmur   . Stroke     12/11 [place],LEFT SIDE WEAKNESS  . Shortness of breath     EXERTION,PT. STATES SHE HAS PULMONARY HTN  . Pneumonia 1990'S     SEVERAL EPISODES  . Asthma     PT. STATES MILD,USES INHALERS PRN,  . Asthma     PT. ON OXYGEN 2 L/[place]   . Diabetes mellitus   . Hypothyroidism   . Anemia, pernicious 1979  . Hepatitis     PT. STATES SHE HAD IT WHEN SHE WAS 14 YRS. OLD  . Blood transfusion 2008  . Arthritis   . GERD (gastroesophageal reflux disease)   . Fibromyalgia   . Squamous cell carcinoma     Left arm  . Breast cancer  2008 s/p chemo and radiation (last tx 4/09) s/p left mastectomy s/p 9 lymph node resection - [name][facility]  . CHF (congestive heart failure) 2010    Current Outpatient Prescriptions  Medication Sig Dispense Refill  . amitriptyline (ELAVIL) 150 MG tablet Take 75 mg by mouth at bedtime.      Marland Kitchen aspirin EC 81 MG tablet Take 1 tablet (81 mg total) by mouth daily.  150 tablet  2  . cyanocobalamin (,VITAMIN B-12,) 1000 MCG/ML injection Inject 1 mL (1,000 mcg total) into the muscle every 30 (thirty) days.  1 mL  0  . folic acid (FOLVITE) 1 MG tablet Take 2 tablets (2 mg total) by mouth daily.  60 tablet  1  . hydrocodone-acetaminophen (LORCET PLUS) 7.5-650 MG per tablet Take 1 tablet by mouth every 6 (six) hours as needed. For pain  50 tablet  0  . letrozole (FEMARA) 2.5 MG tablet Take 1 tablet (2.5 mg total) by mouth daily.      Marland Kitchen levothyroxine (SYNTHROID, LEVOTHROID) 137 MCG tablet Take 150 mcg by mouth  daily.      Marland Kitchen lisinopril (PRINIVIL,ZESTRIL) 2.5 MG tablet Take 2.5 mg by mouth daily.      . metFORMIN (GLUCOPHAGE) 500 MG tablet Take 500 mg by mouth daily with breakfast. One in the am and two before supper      . metolazone (ZAROXOLYN) 2.5 MG tablet Take 2.5 mg by mouth as needed. HOLD medication if WT. Drops below 138 LBS      . metoprolol (LOPRESSOR) 50 MG tablet Take 1 tablet (50 mg total) by mouth 2 (two) times daily.  60 tablet  1  . Multiple Vitamins-Minerals (CENTRUM SILVER) tablet Take 1 tablet by mouth daily.      . pantoprazole (PROTONIX) 40 MG tablet Take 1 tablet (40 mg total) by mouth daily.      . rosuvastatin (CRESTOR) 10 MG tablet Take 5 mg by mouth daily.      Marland Kitchen spironolactone (ALDACTONE) 25 MG tablet Take 1 tablet (25 mg total) by mouth daily.  30 tablet  6  . torsemide (DEMADEX) 20 MG tablet Take 20 mg by mouth 2 (two) times daily. IF WT drops below 138 lbs, Hold medication      . zolpidem (AMBIEN) 5 MG tablet Take 5 mg by mouth at bedtime as needed.         PHYSICAL EXAM: Filed Vitals:   09/02/12 1404  BP: 104/68  Pulse: 62  Weight: 148 lb 6.4 oz (67.314 kg)  SpO2: 98%    General:  Chronically ill appearing.  No acute distress.  No resp difficulty  HEENT: normal Neck: supple. JVP to jaw. Carotids 2+ bilaterally; no bruits. No lymphadenopathy or thryomegaly appreciated. Cor: PMI normal. regular rate & rhythm. No rubs, gallops or 3/6 TR. Loud P2. 2/6 AS murmur Lungs:  clear Abdomen: soft, obese,  nontender,  distended. No hepatosplenomegaly. No bruits or masses. Good bowel sounds. Extremities: no cyanosis, clubbing, rash, R and LLE tr edema.  Neuro: alert & orientedx3, cranial nerves grossly intact. Moves all 4 extremities w/o difficulty. Affect pleasant.     ASSESSMENT & PLAN:

## 2012-09-02 NOTE — Patient Instructions (Addendum)
Increase torsemide 2 tabs twice daily through Friday.  Then return to torsemide 2 tabs in the morning and 1 in the evening.  Keep weight 142-143 pounds.  Follow 3 weeks with Dr. Gala Romney.  Do the following things EVERYDAY: 1) Weigh yourself in the morning before breakfast. Write it down and keep it in a log. 2) Take your medicines as prescribed 3) Eat low salt foods-Limit salt (sodium) to 2000 mg per day.  4) Stay as active as you can everyday 5) Limit all fluids for the day to less than 2 liters

## 2012-09-02 NOTE — Assessment & Plan Note (Addendum)
Volume status remains elevated, she probably has 3 -4 pounds until she is at baseline.  Would like weight 142-143 pounds.  Will increase demadex 40/40 mg for 2 days then start 40/20 mg.  If her weight is 145 pounds or more she will increase demadex back to 40/40 mg.  The patient and her daughter voice understanding of sliding scale demadex and they will call if they have any questions.  Have re-educated on sodium restrictions as she had pizza prior to her weight going up.  Labs today and follow up 3 weeks.

## 2012-09-02 NOTE — Telephone Encounter (Signed)
Opened in error

## 2012-09-25 ENCOUNTER — Encounter: Payer: Self-pay | Admitting: Internal Medicine

## 2012-10-05 ENCOUNTER — Ambulatory Visit (HOSPITAL_COMMUNITY)
Admission: RE | Admit: 2012-10-05 | Discharge: 2012-10-05 | Disposition: A | Discharge status: Home / Self Care | Payer: Medicare Other | Source: Ambulatory Visit | Attending: Internal Medicine | Admitting: Internal Medicine

## 2012-10-05 VITALS — BP 114/62 | HR 58 | Wt 148.1 lb

## 2012-10-05 DIAGNOSIS — I4891 Unspecified atrial fibrillation: Secondary | ICD-10-CM

## 2012-10-05 DIAGNOSIS — I5032 Chronic diastolic (congestive) heart failure: Secondary | ICD-10-CM | POA: Insufficient documentation

## 2012-10-05 DIAGNOSIS — Z954 Presence of other heart-valve replacement: Secondary | ICD-10-CM

## 2012-10-05 DIAGNOSIS — I5042 Chronic combined systolic (congestive) and diastolic (congestive) heart failure: Secondary | ICD-10-CM

## 2012-10-05 DIAGNOSIS — Z952 Presence of prosthetic heart valve: Secondary | ICD-10-CM

## 2012-10-05 MED ORDER — TORSEMIDE 20 MG PO TABS: ORAL_TABLET | ORAL | Status: DC

## 2012-10-05 NOTE — Patient Instructions (Addendum)
Take Torsemide 40 mg in am and 20 mg in pm  Check BMET next week.  Follow up in 3 - 4 weeks  Do the following things EVERYDAY: 1) Weigh yourself in the morning before breakfast. Write it down and keep it in a log. 2) Take your medicines as prescribed 3) Eat low salt foods-Limit salt (sodium) to 2000 mg per day.  4) Stay as active as you can everyday 5) Limit all fluids for the day to less than 2 liters

## 2012-10-05 NOTE — Assessment & Plan Note (Deleted)
Discussed possible valve repair however she would like to continue to manage fluid status.

## 2012-10-05 NOTE — Assessment & Plan Note (Addendum)
She continues with significant HF R >> L. Will increase torsemide to 40 mg in am and 20 mg in pm. Need to follow renal function carefully. Check BMET next week. Creatinine baseline 1.5-1.8.  Follow up in 3 weeks to reassess volume status. I have discussed case with Dr. Cornelius Moras and we both feel that she is a poor candidate for TV ring surgery.

## 2012-10-10 NOTE — Assessment & Plan Note (Signed)
Maintaining SR s/p Maze. Though she is at high risk for relapse. Off anticoagulation due to recurrent falls.

## 2012-10-10 NOTE — Progress Notes (Signed)
Patient ID: [name], female   DOB: September 26, 1941, 71 y.o.   MRN: [id number] PCP: Dr Darius Bump  HPI: 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 Status post bioprosthetic mitral valve replacement, tricuspid valve repair, and Maze procedure on September 24, 2011. Coumadin stopped due to falls. CRI (Cr = 1.3)  Presurgery studies: TEE showed EF 55%. RV was ok. Mild to moderate MS and moderate MR. Severe TR.  LHC/RHC minimal CAD.   12/24/11  ECHO EF 40%. MVR stable. RV moderately-severely dialted/HK. Moderate to severe TR pRSVP 60.    Was on Revatio prior to surgery and could not tolerate - felt terrible. + swelling  08/12/12 RHC RA = 15 v= 21  RV = 64/3/13  PA = 59/16 (33)  PCW = 14  Fick cardiac output/index = 4.1/2.4  PVR = 7.8 Woods  FA sat = 98%  PA sat = 60%, 63%  Assessment:  1. Mid PAH with significantly elevated R-sided pressures and normal cardiac output  2. Severe TR  09/25/12 Potassium 5.0 Creatinine 1.56  She returns for follow up. Last visit demadex increased to 40 mg daily with a goal weight of 142-143 pounds. Complains of fatigue. Dyspnea with exertion persists. Denies PND. + Orthopnea. Mild edema and ab bloating.  Does not wear oxygen. Weight at home 140-147 pounds.      ROS: All systems negative except as listed in HPI, PMH and Problem List.  Past Medical History  Diagnosis Date  . Pulmonary hypertension     s/p RHC 4/10 - PAP 86/29, wedge 21-23  . Gastric ulcer with hemorrhage   . Rheumatic fever     Childhood  . Coronary artery disease     Nonobstructive, LVEF 55%  . Mitral regurgitation     Moderate  . 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 Mitral stenosis with insufficiency, rheumatic   . Hypertension   . Heart murmur   . Stroke     12/11 [place],LEFT SIDE WEAKNESS  .  Shortness of breath     EXERTION,PT. STATES SHE HAS PULMONARY HTN  . Pneumonia 1990'S     SEVERAL EPISODES  . Asthma     PT. STATES MILD,USES INHALERS PRN,  . Asthma     PT. ON OXYGEN 2 L/[place]   . Diabetes mellitus   . Hypothyroidism   . Anemia, pernicious 1979  . Hepatitis     PT. STATES SHE HAD IT WHEN SHE WAS 14 YRS. OLD  . Blood transfusion 2008  . Arthritis   . GERD (gastroesophageal reflux disease)   . Fibromyalgia   . Squamous cell carcinoma     Left arm  . Breast cancer     2008 s/p chemo and radiation (last tx 4/09) s/p left mastectomy s/p 9 lymph node resection - [name], [facility]  . CHF (congestive heart failure) 2010    Current Outpatient Prescriptions  Medication Sig Dispense Refill  . amitriptyline (ELAVIL) 150 MG tablet Take 75 mg by mouth at bedtime.      Marland Kitchen aspirin EC 81 MG tablet Take 1 tablet (81 mg total) by mouth daily.  150 tablet  2  . [EXPIRED] cyanocobalamin (,VITAMIN B-12,) 1000 MCG/ML injection Inject 1 mL (1,000 mcg total) into the muscle every 30 (thirty) days.  1 mL  0  . [EXPIRED] folic  acid (FOLVITE) 1 MG tablet Take 2 tablets (2 mg total) by mouth daily.  60 tablet  1  . hydrocodone-acetaminophen (LORCET PLUS) 7.5-650 MG per tablet Take 1 tablet by mouth every 6 (six) hours as needed. For pain  50 tablet  0  . letrozole (FEMARA) 2.5 MG tablet Take 1 tablet (2.5 mg total) by mouth daily.      Marland Kitchen levothyroxine (SYNTHROID, LEVOTHROID) 137 MCG tablet Take 150 mcg by mouth daily.      Marland Kitchen lisinopril (PRINIVIL,ZESTRIL) 2.5 MG tablet Take 2.5 mg by mouth daily.      . metFORMIN (GLUCOPHAGE) 500 MG tablet Take 500 mg by mouth daily with breakfast. One in the am and two before supper      . metolazone (ZAROXOLYN) 2.5 MG tablet Take 2.5 mg by mouth as needed. HOLD medication if WT. Drops below 138 LBS      . metoprolol (LOPRESSOR) 50 MG tablet Take 1 tablet (50 mg total) by mouth 2 (two) times daily.  60 tablet  1  . pantoprazole (PROTONIX) 40 MG tablet  Take 1 tablet (40 mg total) by mouth daily.      . rosuvastatin (CRESTOR) 10 MG tablet Take 5 mg by mouth daily.      Marland Kitchen torsemide (DEMADEX) 20 MG tablet Take 40 mg in am and 20 mg in pm  90 tablet  3  . zolpidem (AMBIEN) 5 MG tablet Take 5 mg by mouth at bedtime as needed.      . [EXPIRED] Multiple Vitamins-Minerals (CENTRUM SILVER) tablet Take 1 tablet by mouth daily.         PHYSICAL EXAM: Filed Vitals:   10/05/12 1155  BP: 114/62  Pulse: 58  Weight: 148 lb 1.6 oz (67.178 kg)  SpO2: 96%    General:  Chronically ill appearing.  No acute distress.  No resp difficulty with her husband.  HEENT: normal Neck: supple. JVP to jaw. Carotids 2+ bilaterally; no bruits. No lymphadenopathy or thryomegaly appreciated. Cor: PMI normal. regular rate & rhythm. No rubs, gallops or 3/6 TR. Loud P2. 2/6 AS murmur Lungs:  clear Abdomen: soft, obese,  nontender, mildly distended.No bruits or masses. Good bowel sounds. Extremities: R and L fingers cyanotic. No clubbing, rash, R and LLE 1+ edema.  Neuro: alert & orientedx3, cranial nerves grossly intact. Moves all 4 extremities w/o difficulty. Affect pleasant.     ASSESSMENT & PLAN:

## 2012-10-12 ENCOUNTER — Encounter: Payer: Self-pay | Admitting: Internal Medicine

## 2012-10-28 ENCOUNTER — Ambulatory Visit (HOSPITAL_COMMUNITY)
Admission: RE | Admit: 2012-10-28 | Discharge: 2012-10-28 | Disposition: A | Discharge status: Home / Self Care | Payer: Medicare Other | Source: Ambulatory Visit | Attending: Internal Medicine | Admitting: Internal Medicine

## 2012-10-28 VITALS — BP 108/62 | HR 99 | Wt 146.2 lb

## 2012-10-28 DIAGNOSIS — I4891 Unspecified atrial fibrillation: Secondary | ICD-10-CM | POA: Insufficient documentation

## 2012-10-28 DIAGNOSIS — I5042 Chronic combined systolic (congestive) and diastolic (congestive) heart failure: Secondary | ICD-10-CM | POA: Insufficient documentation

## 2012-10-28 NOTE — Progress Notes (Signed)
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 Status post bioprosthetic mitral valve replacement, tricuspid valve repair, and Maze procedure on September 24, 2011. Coumadin stopped due to falls. CRI (Cr = 1.3)  Presurgery studies: TEE showed EF 55%. RV was ok. Mild to moderate MS and moderate MR. Severe TR.  LHC/RHC minimal CAD.   12/24/11  ECHO EF 40%. MVR stable. RV moderately-severely dialted/HK. Moderate to severe TR pRSVP 60.    Was on Revatio prior to surgery and could not tolerate - felt terrible. + swelling  08/12/12 RHC RA = 15 v= 21  RV = 64/3/13  PA = 59/16 (33)  PCW = 14  Fick cardiac output/index = 4.1/2.4  PVR = 7.8 Woods  FA sat = 98%  PA sat = 60%, 63%  Assessment:  1. Mid PAH with significantly elevated R-sided pressures and normal cardiac output  2. Severe TR   She returns for follow up with her daughter.  Last visit torsemide increased 40/20 mg.  She feels pretty good.  Weight stable 143-146 pounds.  She is a chronic 4-5 pillow sleeper due to reflux and neck problems but says she will sometimes wake up flat and denies orthopnea at that time.  Dyspnea with exertion is stable.  She has not taken extra torsemide or metolazone.     ROS: All systems negative except as listed in HPI, PMH and Problem List.  Past Medical History  Diagnosis Date  . Pulmonary hypertension     s/p RHC 4/10 - PAP 86/29, wedge 21-23  . Gastric ulcer with hemorrhage   . Rheumatic fever     Childhood  . Coronary artery disease     Nonobstructive, LVEF 55%  . Mitral regurgitation     Moderate  . 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 Mitral stenosis with insufficiency, rheumatic   . Hypertension   . Heart murmur   . Stroke     12/11 [place],LEFT SIDE WEAKNESS  . Shortness of breath      EXERTION,PT. STATES SHE HAS PULMONARY HTN  . Pneumonia 1990'S     SEVERAL EPISODES  . Asthma     PT. STATES MILD,USES INHALERS PRN,  . Asthma     PT. ON OXYGEN 2 L/[place]   . Diabetes mellitus   . Hypothyroidism   . Anemia, pernicious 1979  . Hepatitis     PT. STATES SHE HAD IT WHEN SHE WAS 14 YRS. OLD  . Blood transfusion 2008  . Arthritis   . GERD (gastroesophageal reflux disease)   . Fibromyalgia   . Squamous cell carcinoma     Left arm  . Breast cancer     2008 s/p chemo and radiation (last tx 4/09) s/p left mastectomy s/p 9 lymph node resection - [name], [facility]  . CHF (congestive heart failure) 2010    Current Outpatient Prescriptions  Medication Sig Dispense Refill  . aspirin EC 81 MG tablet Take 1 tablet (81 mg total) by mouth daily.  150 tablet  2  . hydrocodone-acetaminophen (LORCET PLUS) 7.5-650 MG per tablet Take 1 tablet by mouth every 6 (six) hours as needed. For pain  50 tablet  0  . letrozole (FEMARA) 2.5 MG tablet Take 1 tablet (2.5 mg total) by mouth daily.      Marland Kitchen  levothyroxine (SYNTHROID, LEVOTHROID) 137 MCG tablet Take 150 mcg by mouth daily.      Marland Kitchen lisinopril (PRINIVIL,ZESTRIL) 2.5 MG tablet Take 2.5 mg by mouth daily.      . metolazone (ZAROXOLYN) 2.5 MG tablet Take 2.5 mg by mouth as needed. HOLD medication if WT. Drops below 138 LBS      . metoprolol (LOPRESSOR) 50 MG tablet Take 1 tablet (50 mg total) by mouth 2 (two) times daily.  60 tablet  1  . pantoprazole (PROTONIX) 40 MG tablet Take 1 tablet (40 mg total) by mouth daily.      . rosuvastatin (CRESTOR) 10 MG tablet Take 5 mg by mouth daily.      Marland Kitchen torsemide (DEMADEX) 20 MG tablet Take 40 mg in am and 20 mg in pm  90 tablet  3  . zolpidem (AMBIEN) 5 MG tablet Take 5 mg by mouth at bedtime as needed.      Marland Kitchen amitriptyline (ELAVIL) 150 MG tablet Take 75 mg by mouth at bedtime.      . metFORMIN (GLUCOPHAGE) 500 MG tablet Take 500 mg by mouth daily with breakfast. One in the am and two before  supper         PHYSICAL EXAM: Filed Vitals:   10/28/12 1138  BP: 108/62  Pulse: 99  Weight: 146 lb 4 oz (66.339 kg)  SpO2: 60%    General:  Chronically ill appearing.  No acute distress.  No resp difficulty with her husband.  HEENT: normal Neck: supple. JVP 10-11 with +CV waves. Carotids 2+ bilaterally; no bruits. No lymphadenopathy or thryomegaly appreciated. Cor: PMI normal. regular rate & rhythm. No rubs, gallops or 3/6 TR. Loud P2. 2/6 AS murmur Lungs:  clear Abdomen: soft, obese,  nontender, mildly distended.No bruits or masses. Good bowel sounds. Extremities: R and L fingers cyanotic. No clubbing, rash, tr LE edema.  Neuro: alert & orientedx3, cranial nerves grossly intact. Moves all 4 extremities w/o difficulty. Affect pleasant.     ASSESSMENT & PLAN:

## 2012-10-28 NOTE — Patient Instructions (Addendum)
No changes.    Call if weight is falling or you become dizzy.   Follow up 5-6 weeks.

## 2012-10-30 NOTE — Assessment & Plan Note (Signed)
Maintaining SR.  Continue lopressor.  No coumadin due to fall risk.  Continue ASA.

## 2012-10-30 NOTE — Assessment & Plan Note (Addendum)
Volume status improving with increased torsemide 40/20 mg.  She will increase to 40/40 mg if weight increases weight 145 pounds or more.  Will continue to follow renal function closely.  Will be important to manage volume status as she is not a candidate for TV ring surgery.

## 2012-11-15 IMAGING — CR DG HIP COMPLETE 2+V*R*
3 series · 3 of 3 positions shown · non-contrast
Comparison: None.

CLINICAL DATA: Right hip pain, fell a week ago

RIGHT HIP - COMPLETE 2+ VIEW

[t pelvis a.p.]
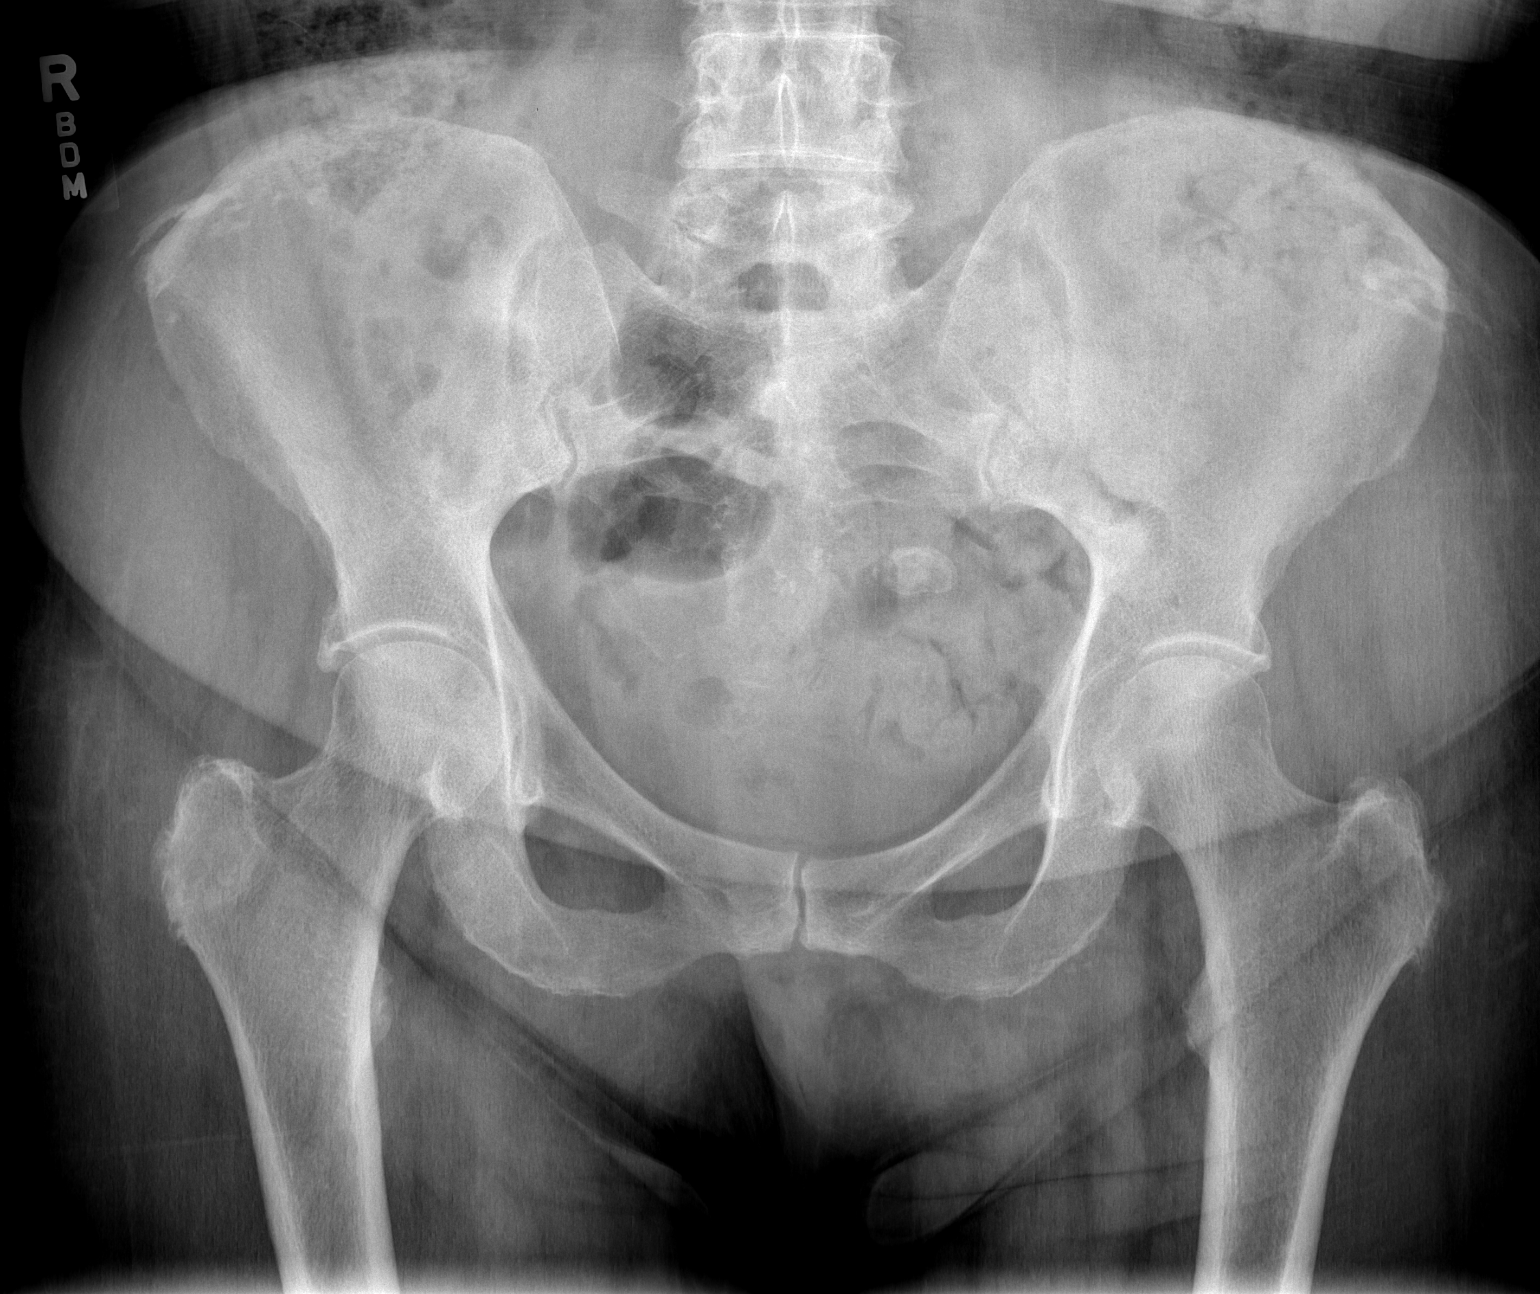

[t hip ap right]
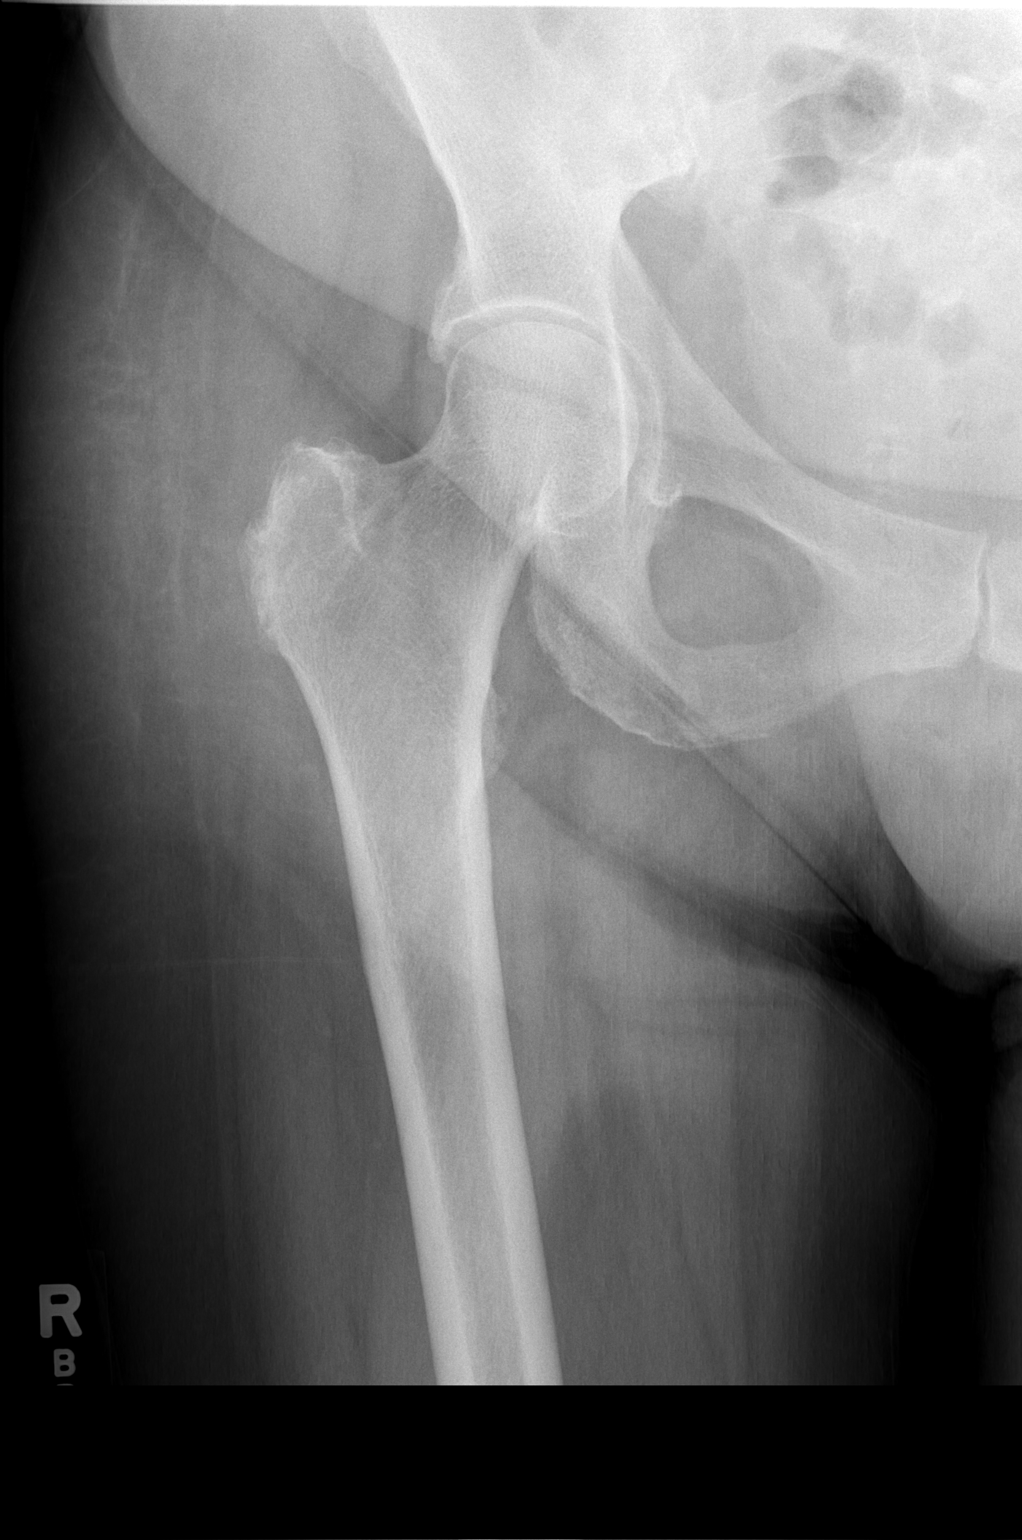

[t hip frog leg right]
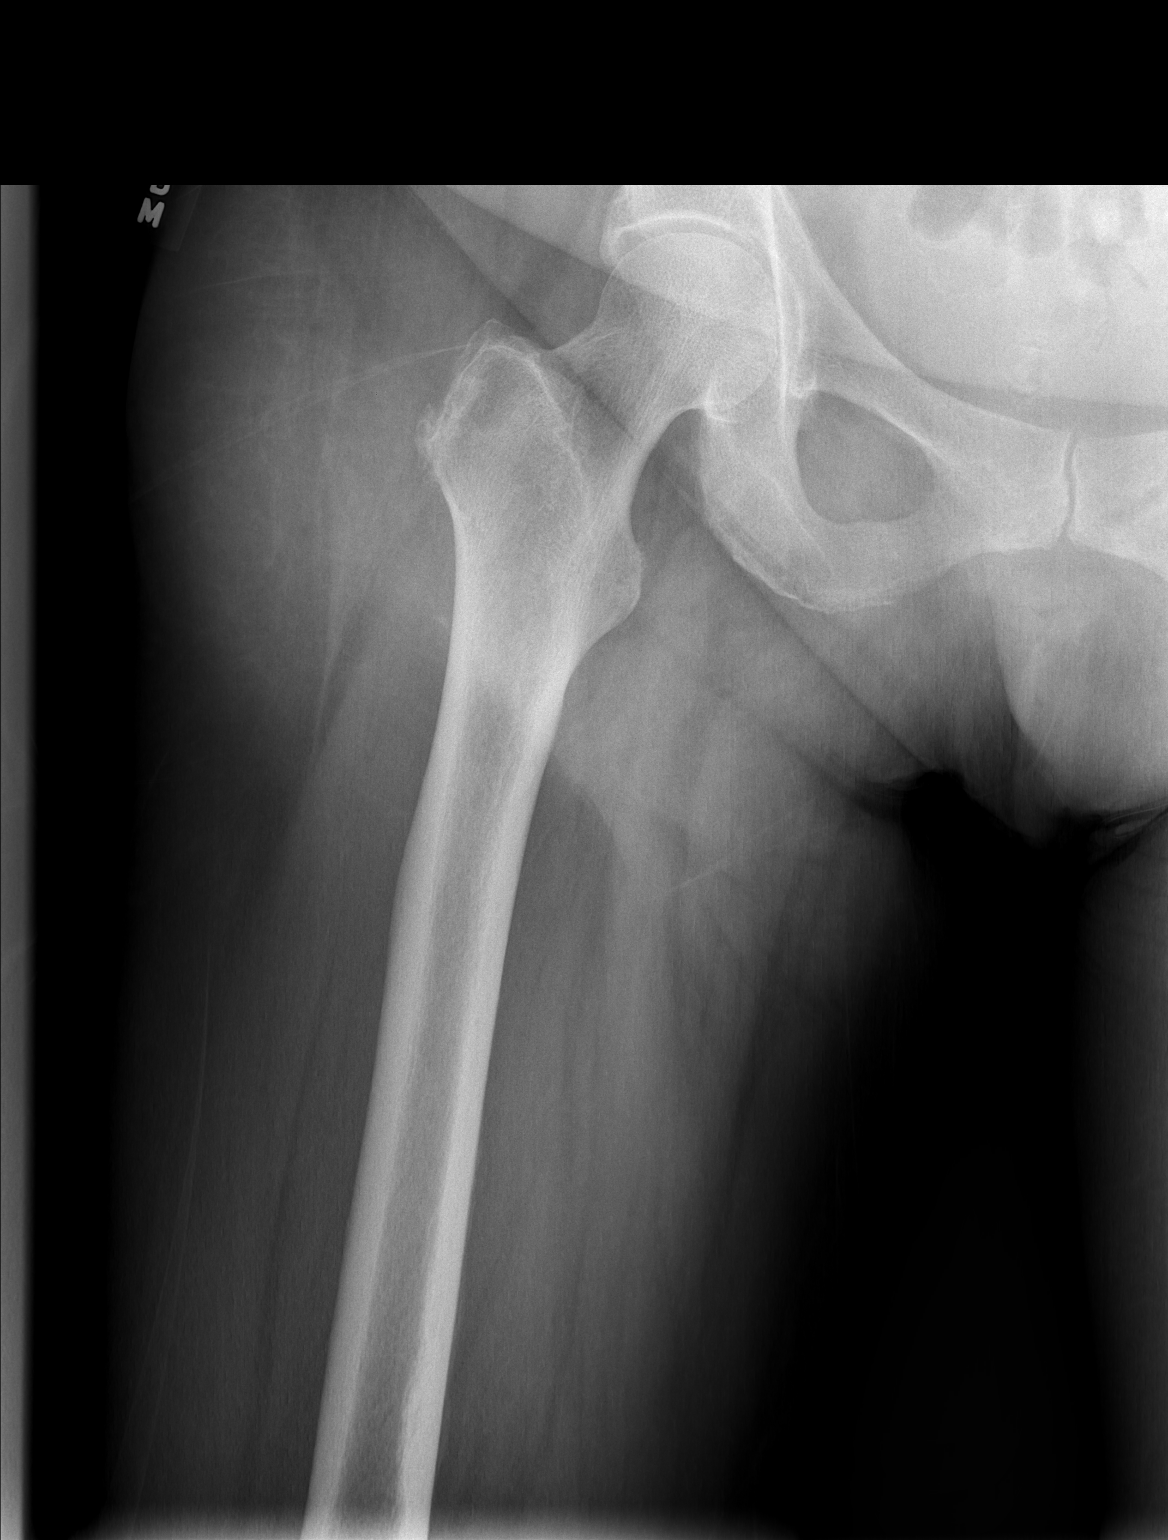

[3 of 3 positions shown; findings below may reference images not displayed]

FINDINGS: Osseous demineralization.
Hip and SI joints preserved.
No acute fracture, dislocation, or bone destruction.
Rounded calcification in left pelvis question calcified lymph node
or old hemorrhagic ovarian cyst.
IMPRESSION: Osseous demineralization.
No acute right hip abnormalities.

## 2012-11-23 ENCOUNTER — Encounter: Payer: Self-pay | Admitting: Thoracic Surgery (Cardiothoracic Vascular Surgery)

## 2012-11-23 ENCOUNTER — Ambulatory Visit: Payer: Self-pay | Admitting: Thoracic Surgery (Cardiothoracic Vascular Surgery)

## 2012-11-23 ENCOUNTER — Ambulatory Visit (INDEPENDENT_AMBULATORY_CARE_PROVIDER_SITE_OTHER): Payer: Medicare Other | Admitting: Thoracic Surgery (Cardiothoracic Vascular Surgery)

## 2012-11-23 VITALS — BP 109/55 | HR 55 | Resp 16 | Ht 62.0 in | Wt 145.0 lb

## 2012-11-23 DIAGNOSIS — I071 Rheumatic tricuspid insufficiency: Secondary | ICD-10-CM

## 2012-11-23 DIAGNOSIS — I050 Rheumatic mitral stenosis: Secondary | ICD-10-CM

## 2012-11-23 DIAGNOSIS — I5032 Chronic diastolic (congestive) heart failure: Secondary | ICD-10-CM

## 2012-11-23 DIAGNOSIS — Z954 Presence of other heart-valve replacement: Secondary | ICD-10-CM

## 2012-11-23 DIAGNOSIS — Z8679 Personal history of other diseases of the circulatory system: Secondary | ICD-10-CM

## 2012-11-23 DIAGNOSIS — Z952 Presence of prosthetic heart valve: Secondary | ICD-10-CM

## 2012-11-23 DIAGNOSIS — Z9889 Other specified postprocedural states: Secondary | ICD-10-CM

## 2012-11-23 DIAGNOSIS — I4891 Unspecified atrial fibrillation: Secondary | ICD-10-CM

## 2012-11-23 DIAGNOSIS — I340 Nonrheumatic mitral (valve) insufficiency: Secondary | ICD-10-CM

## 2012-11-23 DIAGNOSIS — I079 Rheumatic tricuspid valve disease, unspecified: Secondary | ICD-10-CM

## 2012-11-23 DIAGNOSIS — I059 Rheumatic mitral valve disease, unspecified: Secondary | ICD-10-CM

## 2012-11-23 NOTE — Progress Notes (Signed)
[address]            [name] [address]          [phone]     CARDIOTHORACIC SURGERY OFFICE NOTE  Referring Provider is Dolores Patty, MD PCP is Alton Revere, MD   HPI:  Patient returns for followup more than one year status post mitral valve replacement, tricuspid valve repair, and Maze procedure on 09/24/2011 for rheumatic heart disease with mitral stenosis, mitral regurgitation, tricuspid regurgitation, and chronic diastolic congestive heart failure with chronic persistent atrial fibrillation. She was last seen here in this office on 05/25/2012. She is followed carefully by Dr. Gala Romney in heart failure clinic who saw her most recently on 10/30/2012. Over the past 6 months the patient reports that she has remained essentially stable with chronic exertional shortness of breath, bilateral lower extremity edema, and fatigue.  Most recent echocardiogram performed September 2013 demonstrates persistent moderate right ventricular dysfunction with moderate to severe tricuspid regurgitation. The bioprosthetic tissue valve in the mitral position is functioning normally. Right heart catheterization performed 08/12/2012 confirmed mild pulmonary artery hypertension with PA pressures measured 59/16 pulmonary catheter wedge pressure 14 and central venous pressure 15 with severe tricuspid regurgitation and V waves 21 mm mercury. The patient has remained essentially stable on medical therapy with relatively little fluctuation or weight. She has chronic exertional shortness of breath. Lower extremity swelling and abdominal bloating have not been problematic.  She has been maintaining sinus rhythm and she denies any palpitations or dizzy spells. She has not had any chest pain. She reports no other new medical problems nor complaints.   Current Outpatient Prescriptions  Medication Sig Dispense Refill  . aspirin EC 81 MG tablet Take 1 tablet (81 mg total) by mouth  daily.  150 tablet  2  . hydrocodone-acetaminophen (LORCET PLUS) 7.5-650 MG per tablet Take 1 tablet by mouth every 6 (six) hours as needed. For pain  50 tablet  0  . letrozole (FEMARA) 2.5 MG tablet Take 1 tablet (2.5 mg total) by mouth daily.      Marland Kitchen levothyroxine (SYNTHROID, LEVOTHROID) 137 MCG tablet Take 150 mcg by mouth daily.      Marland Kitchen lisinopril (PRINIVIL,ZESTRIL) 2.5 MG tablet Take 2.5 mg by mouth daily.      . metolazone (ZAROXOLYN) 2.5 MG tablet Take 2.5 mg by mouth as needed. HOLD medication if WT. Drops below 138 LBS      . metoprolol (LOPRESSOR) 50 MG tablet Take 1 tablet (50 mg total) by mouth 2 (two) times daily.  60 tablet  1  . pantoprazole (PROTONIX) 40 MG tablet Take 1 tablet (40 mg total) by mouth daily.      . rosuvastatin (CRESTOR) 10 MG tablet Take 5 mg by mouth daily.      Marland Kitchen torsemide (DEMADEX) 20 MG tablet Take 40 mg in am and 20 mg in pm  90 tablet  3  . zolpidem (AMBIEN) 5 MG tablet Take 5 mg by mouth at bedtime as needed.      Marland Kitchen amitriptyline (ELAVIL) 150 MG tablet Take 75 mg by mouth at bedtime.      . metFORMIN (GLUCOPHAGE) 500 MG tablet Take 500 mg by mouth daily with breakfast. One in the am and two before supper          Physical Exam:   BP 109/55  Pulse 55  Resp 16  Ht 5\' 2"  (1.575 m)  Wt 145 lb (65.772 kg)  BMI 26.52 kg/m2  SpO2 98%  General:  Chronically ill-appearing  Chest:   Fairly clear with a few bibasilar crackles  CV:   Regular rate and rhythm with holosystolic murmur heard best along the left sternal border  Incisions:  Sternotomy is healed completely  Abdomen:  Soft and mildly distended without obvious ascites  Extremities:  Warm with trace lower extremity edema  Diagnostic Tests:  2 channel telemetry rhythm strip demonstrates normal sinus rhythm   Transthoracic Echocardiography  Patient:    [name], [name] MR #:       [id number] Study Date: 08/05/2012 Gender:     F Age:        70 Height:     157.5cm Weight:     64.5kg BSA:         1.66m^2 Pt. Status: Room:    PERFORMING   [place], [facility]  SONOGRAPHER  [place], [place]  ATTENDING    Bensimhon, Gray Bernhardt     Bensimhon, Hardie Shackleton    Bensimhon, Daniel cc:  ------------------------------------------------------------ LV EF: 45%  ------------------------------------------------------------ History:   PMH:  Rheumatic Fever, PHTN, SVT, MV Replacement, Tricuspid Repair, Carcinoma Breast Cancer heart failure Dyspnea.  Atrial fibrillation.  Stroke.  ------------------------------------------------------------ Study Conclusions  - Left ventricle: There is severe hypokinesis of the   inferior wall. There is mild paradoxical septal motion.   The cavity size was normal. Wall thickness was normal. The   estimated ejection fraction was 45%. - Aortic valve: Sclerosis without stenosis. Mild to moderate   regurgitation. - Mitral valve: Tissue mitral prosthesis seems to work well.   However, the dopler data is inadequate to fully assess   effective mitral oriface. - Right ventricle: The cavity size was moderately dilated.   Systolic function was moderately reduced. - Right atrium: The atrium was mildly dilated. - Tricuspid valve: There is history of a procedure to the TV   in the past. Moderate-severe regurgitation. - Pulmonary arteries: PA peak pressure: 67mm Hg (S). Transthoracic echocardiography.  M-mode, complete 2D, spectral Doppler, and color Doppler.  Height:  Height: 157.5cm. Height: 62in.  Weight:  Weight: 64.5kg. Weight: 142lb.  Body mass index:  BMI: 26kg/m^2.  Body surface area:    BSA: 1.5m^2.  Patient status:  Inpatient.  Location: Echo laboratory.  ------------------------------------------------------------  ------------------------------------------------------------ Left ventricle:  There is severe hypokinesis of the inferior wall. There is mild paradoxical septal motion. The cavity size was normal. Wall thickness was normal. The  estimated ejection fraction was 45%.  ------------------------------------------------------------ Aortic valve:  Sclerosis without stenosis.  Doppler:   Mild to moderate regurgitation.  ------------------------------------------------------------ Aorta:  Aortic root: The aortic root was normal in size.  ------------------------------------------------------------ Mitral valve:  Tissue mitral prosthesis seems to work well. However, the dopler data is inadequate to fully assess effective mitral oriface.  Doppler:     Mean gradient: 4mm Hg (D). Peak gradient: 13mm Hg (D).  ------------------------------------------------------------ Left atrium:  The atrium was normal in size.  ------------------------------------------------------------ Right ventricle:  The cavity size was moderately dilated. Systolic function was moderately reduced.  ------------------------------------------------------------ Pulmonic valve:    The valve appears to be grossly normal.  Doppler:   No significant regurgitation.  ------------------------------------------------------------ Tricuspid valve:  There is history of a procedure to the TV in the past.  Doppler:   Moderate-severe regurgitation. Mean gradient: 28mm Hg (D). Peak gradient: 51mm Hg (D).   ------------------------------------------------------------ Right atrium:  The atrium was  mildly dilated.  ------------------------------------------------------------ Pericardium:  There was no pericardial effusion.  ------------------------------------------------------------  2D measurements        Normal  Doppler measurements   Normal Left ventricle                 Main pulmonary LVID ED,     37 mm     43-52   artery chord,                         Pressure, S    67 mm   =30 PLAX                                             Hg LVID ES,     26 mm     23-38   LVOT chord,                         Peak vel, S  88.4 cm/s ------ PLAX                            VTI, S       18.9 cm   ------ FS, chord,   30 %      >29     Mitral valve PLAX                           Mean vel, D  88.4 cm/s ------ LVPW, ED      8 mm     ------  Mean            4 mm   ------ IVS/LVPW   0.75        <1.3    gradient, D       Hg ratio, ED                      Peak           13 mm   ------ Ventricular septum             gradient, D       Hg IVS, ED       6 mm     ------  Annulus VTI  56.8 cm   ------ LVOT                           Tricuspid valve Diam, S      15 mm     ------  Mean vel, D   245 cm/s ------ Area       1.77 cm^2   ------  Mean           28 mm   ------ Aorta                          gradient, D       Hg Root diam,   20 mm     ------  Peak           51 mm   ------ ED  gradient, D       Hg Left atrium                    Max inflow    356 cm/s ------ AP dim       37 mm     ------  vel AP dim     2.18 cm/m^2 <2.2    VTI at        107 cm   ------ index                          annulus                                Regurg peak   362 cm/s ------                                vel                                Peak RV-RA     52 mm   ------                                gradient, S       Hg                                Systemic veins                                Estimated      15 mm   ------                                CVP               Hg                                Right ventricle                                Pressure, S    67 mm   <30                                                  Hg   ------------------------------------------------------------ Prepared and Electronically Authenticated by  Willa Rough 2013-09-25T11:36:57.550   Results for KAMBRIA, GRIMA (MRN [id number]) as of 11/23/2012 11:29  Ref. Range 09/02/2012 14:36  BUN Latest Range: 6-23 mg/dL 55 (H)  Creatinine Latest Range: 0.50-1.10 mg/dL 1.19 (H)     Cardiac Cath Procedure Note:  Indication:  Right heart failure, severe tricuspid  regurgitation  Procedures performed:  1) Right heart catheterization  Description of procedure:   The risks and indication of the procedure were explained. Consent was signed and placed on the  chart. An appropriate timeout was taken prior to the procedure. The right groin was prepped and draped in the routine sterile fashion and anesthetized with 1% local lidocaine.   A 7 FR venous sheath was placed in the right femoral vein using a modified Seldinger technique. A standard Swan-Ganz catheter was used for the procedure.   Complications: None apparent.  Findings:  RA = 15 v= 21 RV =  64/3/13 PA =  59/16 (33) PCW = 14 Fick cardiac output/index = 4.1/2.4 PVR = 7.8 Woods FA sat =  98% PA sat = 60%, 63%  Assessment: 1. Mid PAH with significantly elevated R-sided pressures and normal cardiac output 2. Severe TR  Plan/Discussion:  Her TR appears hemodynamically significant. Will d/w Dr. Cornelius Moras regarding possibility of TVR to prevent further RV deterioration. Prior to this may be worth a trial of pulmonary vasodilators.   Daniel Bensimhon,MD 12:49 PM   Impression:  The patient remains clinically stable more than 1 year following mitral valve replacement using a bioprosthetic tissue valve, tricuspid valve ring annuloplasty, and Maze procedure for long-standing rheumatic heart disease with mixed mitral stenosis and mitral regurgitation, functional tricuspid regurgitation with chronic pulmonary hypertension and right heart dysfunction, and chronic persistent atrial fibrillation. Followup echocardiograms have demonstrated persistent moderate to severe tricuspid regurgitation due to severe leaflet tethering and restriction because of right ventricular chamber enlargement.  Reoperation for tricuspid valve replacement would come with very high-risk. At this point the patient remains fairly stable on medical therapy. She continues to maintain sinus rhythm.   Plan:  I've discussed matters  regarding the patient's persistent tricuspid regurgitation with chronic pulmonary hypertension and right ventricular failure with the patient and her family in the office today. The patient is not interested in high-risk surgery, and I share her concerns that she probably would not do well with reoperation for tricuspid valve replacement.  We will plan to see her back in one years time to see how she is getting along.   Salvatore Decent. Cornelius Moras, MD 11/23/2012 11:21 AM

## 2012-12-10 ENCOUNTER — Ambulatory Visit (HOSPITAL_COMMUNITY): Payer: Medicare Other

## 2012-12-21 ENCOUNTER — Ambulatory Visit (HOSPITAL_COMMUNITY)
Admission: RE | Admit: 2012-12-21 | Discharge: 2012-12-21 | Disposition: A | Discharge status: Home / Self Care | Payer: Medicare Other | Source: Ambulatory Visit | Attending: Internal Medicine | Admitting: Internal Medicine

## 2012-12-21 VITALS — BP 126/68 | HR 63 | Wt 152.0 lb

## 2012-12-21 DIAGNOSIS — I079 Rheumatic tricuspid valve disease, unspecified: Secondary | ICD-10-CM

## 2012-12-21 DIAGNOSIS — I369 Nonrheumatic tricuspid valve disorder, unspecified: Secondary | ICD-10-CM | POA: Insufficient documentation

## 2012-12-21 DIAGNOSIS — I071 Rheumatic tricuspid insufficiency: Secondary | ICD-10-CM

## 2012-12-21 DIAGNOSIS — I5042 Chronic combined systolic (congestive) and diastolic (congestive) heart failure: Secondary | ICD-10-CM | POA: Insufficient documentation

## 2012-12-21 NOTE — Patient Instructions (Addendum)
Increase torsemide 2 tabs in the morning and 2 in the afternoon for 2 days.    Try and keep weight closer to 145 pounds.    Follow up 1 month with Dr. Gala Romney.

## 2012-12-21 NOTE — Progress Notes (Signed)
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 Status post bioprosthetic mitral valve replacement, tricuspid valve repair, and Maze procedure on September 24, 2011. Coumadin stopped due to falls. CRI (Cr = 1.3)  Presurgery studies:  TEE showed EF 55%. RV was ok. Mild to moderate MS and moderate MR. Severe TR. LHC/RHC minimal CAD.  12/24/11  ECHO EF 40%. MVR stable. RV moderately-severely dialted/HK. Moderate to severe TR pRSVP 60.    Was on Revatio prior to surgery and could not tolerate - felt terrible. + swelling  08/05/12 Echo: EF 45%, mild paradoxical septal motiot  08/12/12 RHC RA = 15 v= 21  RV = 64/3/13  PA = 59/16 (33)  PCW = 14  Fick cardiac output/index = 4.1/2.4  PVR = 7.8 Woods  FA sat = 98%  PA sat = 60%, 63%   She returns for follow up today with her husband and daughter.  She feels ok.  Her weight has climbed from 143-146 to 145-149 pounds.  She thinks her weight is up because she is eating more but feels she is watching sodium intake closely.    She has chronic 4-5 pillow orthopnea but says sometimes she does wake up laying flat and feels ok.  Dyspnea with exertion.  No syncope.      ROS: All systems negative except as listed in HPI, PMH and Problem List.  Past Medical History  Diagnosis Date  . Pulmonary hypertension     s/p RHC 4/10 - PAP 86/29, wedge 21-23  . Gastric ulcer with hemorrhage   . Rheumatic fever     Childhood  . Coronary artery disease     Nonobstructive, LVEF 55%  . Mitral regurgitation     Moderate  . 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 Mitral stenosis with insufficiency, rheumatic   . Hypertension   . Heart murmur   . Stroke     12/11 [place],LEFT SIDE WEAKNESS  . Shortness of breath     EXERTION,PT. STATES SHE HAS PULMONARY HTN  .  Pneumonia 1990'S     SEVERAL EPISODES  . Asthma     PT. STATES MILD,USES INHALERS PRN,  . Asthma     PT. ON OXYGEN 2 L/[place]   . Diabetes mellitus   . Hypothyroidism   . Anemia, pernicious 1979  . Hepatitis     PT. STATES SHE HAD IT WHEN SHE WAS 14 YRS. OLD  . Blood transfusion 2008  . Arthritis   . GERD (gastroesophageal reflux disease)   . Fibromyalgia   . Squamous cell carcinoma     Left arm  . Breast cancer     2008 s/p chemo and radiation (last tx 4/09) s/p left mastectomy s/p 9 lymph node resection - [name][facility]  . CHF (congestive heart failure) 2010    Current Outpatient Prescriptions  Medication Sig Dispense Refill  . amitriptyline (ELAVIL) 150 MG tablet Take 75 mg by mouth at bedtime.      . hydrocodone-acetaminophen (LORCET PLUS) 7.5-650 MG per tablet Take 1 tablet by mouth every 6 (six) hours as needed. For pain  50 tablet  0  . letrozole (FEMARA) 2.5 MG tablet Take 1 tablet (2.5 mg total) by mouth daily.      Marland Kitchen levothyroxine (SYNTHROID, LEVOTHROID) 137 MCG tablet Take 150  mcg by mouth daily.      Marland Kitchen lisinopril (PRINIVIL,ZESTRIL) 2.5 MG tablet Take 2.5 mg by mouth daily.      . metFORMIN (GLUCOPHAGE) 500 MG tablet Take 500 mg by mouth every evening.       . metolazone (ZAROXOLYN) 2.5 MG tablet Take 2.5 mg by mouth as needed. HOLD medication if WT. Drops below 138 LBS      . metoprolol (LOPRESSOR) 50 MG tablet Take 1 tablet (50 mg total) by mouth 2 (two) times daily.  60 tablet  1  . pantoprazole (PROTONIX) 40 MG tablet Take 1 tablet (40 mg total) by mouth daily.      . rosuvastatin (CRESTOR) 10 MG tablet Take 5 mg by mouth daily.      Marland Kitchen torsemide (DEMADEX) 20 MG tablet Take 40 mg in am and 20 mg in pm  90 tablet  3  . zolpidem (AMBIEN) 5 MG tablet Take 5 mg by mouth at bedtime as needed.       No current facility-administered medications for this encounter.     PHYSICAL EXAM: Filed Vitals:   12/21/12 1134  BP: 126/68  Pulse: 63  Weight: 152 lb (68.947  kg)  SpO2: 96%    General:  Chronically ill appearing.  No acute distress.  No resp difficulty with her husband.  HEENT: normal Neck: supple. JVP 10-11 with +CV waves. Carotids 2+ bilaterally; no bruits. No lymphadenopathy or thryomegaly appreciated. Cor: PMI normal. regular rate & rhythm. No rubs, gallops or 3/6 TR. Loud P2. 2/6 AS murmur Lungs:  clear Abdomen: soft, obese,  nontender, mildly distended.No bruits or masses. Good bowel sounds. Extremities: R and L fingers cyanotic. No clubbing, rash, tr LE edema.  Neuro: alert & orientedx3, cranial nerves grossly intact. Moves all 4 extremities w/o difficulty. Affect pleasant.     ASSESSMENT & PLAN:

## 2012-12-22 NOTE — Assessment & Plan Note (Signed)
Felt to be a poor candidate for TV ring surgery therefore will continue to manage fluid aggressively.

## 2012-12-22 NOTE — Assessment & Plan Note (Signed)
Volume status climbing on current regimen therefore will increase torsemide 40 mg BID for 3 days.  Would like weight closer to 145 pounds.  Have discussed use of sliding scale torsemide for weight greater than 145 pounds.  She voices understanding.

## 2013-01-18 ENCOUNTER — Ambulatory Visit (HOSPITAL_COMMUNITY)
Admission: RE | Admit: 2013-01-18 | Discharge: 2013-01-18 | Disposition: A | Discharge status: Home / Self Care | Payer: Medicare Other | Source: Ambulatory Visit | Attending: Internal Medicine | Admitting: Internal Medicine

## 2013-01-18 ENCOUNTER — Encounter (HOSPITAL_COMMUNITY): Payer: Self-pay

## 2013-01-18 VITALS — BP 124/74 | HR 57 | Wt 150.8 lb

## 2013-01-18 DIAGNOSIS — I059 Rheumatic mitral valve disease, unspecified: Secondary | ICD-10-CM | POA: Insufficient documentation

## 2013-01-18 DIAGNOSIS — I5042 Chronic combined systolic (congestive) and diastolic (congestive) heart failure: Secondary | ICD-10-CM | POA: Insufficient documentation

## 2013-01-18 LAB — BASIC METABOLIC PANEL WITH GFR
BUN: 33 mg/dL — ABNORMAL HIGH (ref 6–23)
CO2: 26 meq/L (ref 19–32)
Calcium: 10.5 mg/dL (ref 8.4–10.5)
Chloride: 100 meq/L (ref 96–112)
Creatinine, Ser: 1.31 mg/dL — ABNORMAL HIGH (ref 0.50–1.10)
GFR calc Af Amer: 47 mL/min — ABNORMAL LOW (ref >90)
GFR calc non Af Amer: 41 mL/min — ABNORMAL LOW (ref >90)
Glucose, Bld: 130 mg/dL — ABNORMAL HIGH (ref 70–99)
Potassium: 3.4 meq/L — ABNORMAL LOW (ref 3.5–5.1)
Sodium: 138 meq/L (ref 135–145)

## 2013-01-18 NOTE — Progress Notes (Signed)
PCP: Dr Darius Bump  HPI: Ms. Basden is a 72 y.o.female with h/o DM2, HTN, L breast CA s/p mastectomy/chemo/XRT 8/08, PUD s/p partial gastrectomy, rheumatic fever with MS/MR/TR with related PAH, CVA with transient R sided weakness 12/11. Status post bioprosthetic mitral valve replacement, tricuspid valve repair, and Maze procedure on September 24, 2011. Coumadin stopped due to falls. CRI (Cr = 1.3)  Presurgery studies:  TEE showed EF 55%. RV was ok. Mild to moderate MS and moderate MR. Severe TR. LHC/RHC minimal CAD.  12/24/11  ECHO EF 40%. MVR stable. RV moderately-severely dialted/HK. Moderate to severe TR pRSVP 60.    Was on Revatio prior to surgery and could not tolerate - felt terrible. + swelling  08/05/12 Echo: EF 45%, mild paradoxical septal motiot  08/12/12 RHC RA = 15 v= 21  RV = 64/3/13  PA = 59/16 (33)  PCW = 14  Fick cardiac output/index = 4.1/2.4  PVR = 7.8 Woods  FA sat = 98%  PA sat = 60%, 63%   She returns for follow up today with her husband and daughter.  Last visit weight increased therefore torsemide increased 40 mg BID for 3 days.  With improvement in weight to 142-145.  Now weight back up to 148-149 pounds.  Has not taken extra torsemide or metolazone  Has fullness sensation in chest now with rest.  Dyspnea with mild exertion.  Says she is doing really well with sodium diet.  Chronic 4-5 pillow orthopnea.      ROS: All systems negative except as listed in HPI, PMH and Problem List.  Past Medical History  Diagnosis Date  . Pulmonary hypertension     s/p RHC 4/10 - PAP 86/29, wedge 21-23  . Gastric ulcer with hemorrhage   . Rheumatic fever     Childhood  . Coronary artery disease     Nonobstructive, LVEF 55%  . Mitral regurgitation     Moderate  . 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 Mitral stenosis with insufficiency, rheumatic   . Hypertension   . Heart murmur   . Stroke     12/11 [place],LEFT SIDE WEAKNESS  . Shortness of breath      EXERTION,PT. STATES SHE HAS PULMONARY HTN  . Pneumonia 1990'S     SEVERAL EPISODES  . Asthma     PT. STATES MILD,USES INHALERS PRN,  . Asthma     PT. ON OXYGEN 2 L/[place]   . Diabetes mellitus   . Hypothyroidism   . Anemia, pernicious 1979  . Hepatitis     PT. STATES SHE HAD IT WHEN SHE WAS 14 YRS. OLD  . Blood transfusion 2008  . Arthritis   . GERD (gastroesophageal reflux disease)   . Fibromyalgia   . Squamous cell carcinoma     Left arm  . Breast cancer     2008 s/p chemo and radiation (last tx 4/09) s/p left mastectomy s/p 9 lymph node resection - [name][facility]  . CHF (congestive heart failure) 2010    Current Outpatient Prescriptions  Medication Sig Dispense Refill  . hydrocodone-acetaminophen (LORCET PLUS) 7.5-650 MG per tablet Take 1 tablet by mouth every 6 (six) hours as needed. For pain  50 tablet  0  . letrozole (FEMARA) 2.5 MG tablet Take 1 tablet (2.5 mg total) by mouth daily.      Marland Kitchen levothyroxine (SYNTHROID, LEVOTHROID) 137 MCG tablet Take 150 mcg by mouth daily.      Marland Kitchen lisinopril (PRINIVIL,ZESTRIL) 2.5 MG  tablet Take 2.5 mg by mouth daily.      . metolazone (ZAROXOLYN) 2.5 MG tablet Take 2.5 mg by mouth as needed. HOLD medication if WT. Drops below 138 LBS      . metoprolol (LOPRESSOR) 50 MG tablet Take 1 tablet (50 mg total) by mouth 2 (two) times daily.  60 tablet  1  . pantoprazole (PROTONIX) 40 MG tablet Take 1 tablet (40 mg total) by mouth daily.      . rosuvastatin (CRESTOR) 10 MG tablet Take 5 mg by mouth daily.      Marland Kitchen torsemide (DEMADEX) 20 MG tablet Take 40 mg in am and 20 mg in pm  90 tablet  3  . zolpidem (AMBIEN) 5 MG tablet Take 5 mg by mouth at bedtime as needed.      Marland Kitchen amitriptyline (ELAVIL) 150 MG tablet Take 75 mg by mouth at bedtime.      . metFORMIN (GLUCOPHAGE) 500 MG tablet Take 500 mg by mouth every evening.        No current facility-administered medications for this encounter.     PHYSICAL EXAM: Filed Vitals:   01/18/13 1234   BP: 124/74  Pulse: 57  Weight: 150 lb 12.8 oz (68.402 kg)  SpO2: 98%    General:  Chronically ill appearing.  No acute distress.  No resp difficulty with her husband.  HEENT: normal Neck: supple. JVP 10-11 with +CV waves. Carotids 2+ bilaterally; no bruits. No lymphadenopathy or thryomegaly appreciated. Cor: PMI normal. regular rate & rhythm. No rubs, gallops or 3/6 TR. Loud P2. 2/6 AS murmur Lungs:  clear Abdomen: soft, obese,  nontender, mildly distended.No bruits or masses. Good bowel sounds. Extremities: R and L fingers cyanotic. No clubbing, rash, tr LE edema.  Neuro: alert & orientedx3, cranial nerves grossly intact. Moves all 4 extremities w/o difficulty. Affect pleasant.     ASSESSMENT & PLAN:

## 2013-01-18 NOTE — Patient Instructions (Addendum)
Increase torsemide 2 tabs twice daily  Labs today  Follow up 1 month with echo.  Your physician has requested that you have an echocardiogram. Echocardiography is a painless test that uses sound waves to create images of your heart. It provides your doctor with information about the size and shape of your heart and how well your heart's chambers and valves are working. This procedure takes approximately one hour. There are no restrictions for this procedure.

## 2013-01-18 NOTE — Assessment & Plan Note (Signed)
Volume status was doing well until ~ 5 days ago and now weight up 3-4 pounds with dyspnea on exertion.  Will plan to increase torsemide 40 mg BID she will only go back to 40/20 mg if weight falls below 143 pounds.  She voices understanding.  Have discussed low sodium diet and fluid restrictions with the patient and family.  Will check BMET today.  Follow up 1 month with echo.  She knows to call if weight does not return to baseline.

## 2013-01-18 NOTE — Assessment & Plan Note (Signed)
Felt to be a poor candidate for TV ring surgery therefore will continue to manage fluid aggressively.  

## 2013-01-27 ENCOUNTER — Telehealth (HOSPITAL_COMMUNITY): Payer: Self-pay | Admitting: *Deleted

## 2013-01-27 MED ORDER — POTASSIUM CHLORIDE CRYS ER 20 MEQ PO TBCR: 20.0000 meq | EXTENDED_RELEASE_TABLET | Freq: Every day | ORAL | Status: DC

## 2013-01-27 NOTE — Telephone Encounter (Signed)
Message copied by Noralee Space on Wed Jan 27, 2013  4:08 PM ------      Message from: Hadassah Pais      Created: Mon Jan 18, 2013  4:49 PM       Kdur 40 mEq today then start 20 mEq daily. ------

## 2013-01-27 NOTE — Telephone Encounter (Signed)
Pt aware.

## 2013-02-03 ENCOUNTER — Other Ambulatory Visit (HOSPITAL_COMMUNITY): Payer: Self-pay | Admitting: Adult Health

## 2013-02-17 ENCOUNTER — Ambulatory Visit (HOSPITAL_COMMUNITY)
Admission: RE | Admit: 2013-02-17 | Discharge: 2013-02-17 | Disposition: A | Discharge status: Home / Self Care | Payer: Medicare Other | Source: Ambulatory Visit | Attending: Internal Medicine | Admitting: Internal Medicine

## 2013-02-17 ENCOUNTER — Ambulatory Visit (HOSPITAL_BASED_OUTPATIENT_CLINIC_OR_DEPARTMENT_OTHER)
Admission: RE | Admit: 2013-02-17 | Discharge: 2013-02-17 | Disposition: A | Discharge status: Home / Self Care | Payer: Medicare Other | Source: Ambulatory Visit | Attending: Internal Medicine | Admitting: Internal Medicine

## 2013-02-17 VITALS — BP 104/60 | HR 60 | Wt 145.5 lb

## 2013-02-17 DIAGNOSIS — I079 Rheumatic tricuspid valve disease, unspecified: Secondary | ICD-10-CM | POA: Insufficient documentation

## 2013-02-17 DIAGNOSIS — I071 Rheumatic tricuspid insufficiency: Secondary | ICD-10-CM

## 2013-02-17 DIAGNOSIS — Z853 Personal history of malignant neoplasm of breast: Secondary | ICD-10-CM | POA: Insufficient documentation

## 2013-02-17 DIAGNOSIS — I369 Nonrheumatic tricuspid valve disorder, unspecified: Secondary | ICD-10-CM

## 2013-02-17 DIAGNOSIS — Z952 Presence of prosthetic heart valve: Secondary | ICD-10-CM | POA: Insufficient documentation

## 2013-02-17 DIAGNOSIS — D510 Vitamin B12 deficiency anemia due to intrinsic factor deficiency: Secondary | ICD-10-CM | POA: Insufficient documentation

## 2013-02-17 DIAGNOSIS — I69998 Other sequelae following unspecified cerebrovascular disease: Secondary | ICD-10-CM | POA: Insufficient documentation

## 2013-02-17 DIAGNOSIS — Z8711 Personal history of peptic ulcer disease: Secondary | ICD-10-CM | POA: Insufficient documentation

## 2013-02-17 DIAGNOSIS — I5020 Unspecified systolic (congestive) heart failure: Secondary | ICD-10-CM | POA: Insufficient documentation

## 2013-02-17 DIAGNOSIS — I2789 Other specified pulmonary heart diseases: Secondary | ICD-10-CM

## 2013-02-17 DIAGNOSIS — I4891 Unspecified atrial fibrillation: Secondary | ICD-10-CM

## 2013-02-17 DIAGNOSIS — J45909 Unspecified asthma, uncomplicated: Secondary | ICD-10-CM | POA: Insufficient documentation

## 2013-02-17 DIAGNOSIS — Z79899 Other long term (current) drug therapy: Secondary | ICD-10-CM | POA: Insufficient documentation

## 2013-02-17 DIAGNOSIS — Z903 Acquired absence of stomach [part of]: Secondary | ICD-10-CM | POA: Insufficient documentation

## 2013-02-17 DIAGNOSIS — I509 Heart failure, unspecified: Secondary | ICD-10-CM | POA: Insufficient documentation

## 2013-02-17 DIAGNOSIS — I050 Rheumatic mitral stenosis: Secondary | ICD-10-CM | POA: Insufficient documentation

## 2013-02-17 DIAGNOSIS — I5082 Biventricular heart failure: Secondary | ICD-10-CM

## 2013-02-17 DIAGNOSIS — I2510 Atherosclerotic heart disease of native coronary artery without angina pectoris: Secondary | ICD-10-CM | POA: Insufficient documentation

## 2013-02-17 DIAGNOSIS — E119 Type 2 diabetes mellitus without complications: Secondary | ICD-10-CM | POA: Insufficient documentation

## 2013-02-17 DIAGNOSIS — I5042 Chronic combined systolic (congestive) and diastolic (congestive) heart failure: Secondary | ICD-10-CM

## 2013-02-17 DIAGNOSIS — I10 Essential (primary) hypertension: Secondary | ICD-10-CM | POA: Insufficient documentation

## 2013-02-17 DIAGNOSIS — R29898 Other symptoms and signs involving the musculoskeletal system: Secondary | ICD-10-CM | POA: Insufficient documentation

## 2013-02-17 DIAGNOSIS — Z9010 Acquired absence of unspecified breast and nipple: Secondary | ICD-10-CM | POA: Insufficient documentation

## 2013-02-17 NOTE — Progress Notes (Signed)
  Echocardiogram 2D Echocardiogram has been performed.  Kanae Ignatowski FRANCES 02/17/2013, 10:48 AM

## 2013-02-17 NOTE — Patient Instructions (Addendum)
Follow up in 2-3 months

## 2013-02-24 NOTE — Assessment & Plan Note (Signed)
Likely secondary to longstanding MV disease. PA pressures stable by echo. Has not tolerated Revatio in past. Continue current therapy.

## 2013-02-24 NOTE — Assessment & Plan Note (Signed)
Stable. Not candidate for TVR. Manage RHF with diuretics.

## 2013-02-24 NOTE — Assessment & Plan Note (Signed)
She is s/p Maze. Off coumadin due to recurrent falls.

## 2013-02-24 NOTE — Progress Notes (Signed)
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 Status post bioprosthetic mitral valve replacement, tricuspid valve repair, and Maze procedure on September 24, 2011.(pre-op cath minimal CAD) Coumadin stopped due to falls. CRI (Cr = 1.3)   Was on Revatio prior to surgery and could not tolerate - felt terrible. + swelling  08/05/12 Echo: EF 45%, mild paradoxical septal motion MVR stable. RV moderately-severely dialted/HK. Moderate to severe TR pRSVP 60. Possibility of re-operation for TVR discussed with Dr. Cornelius Moras but patient felt to be too frail.  08/12/12 RHC RA = 15 v= 21  RV = 64/3/13  PA = 59/16 (33)  PCW = 14  Fick cardiac output/index = 4.1/2.4  PVR = 7.8 Woods  FA sat = 98%  PA sat = 60%, 63%   She returns for follow up today with her husband and daughter.  She feels good today.  Her weight at home is ranging from 139-143 pounds.  She is doing well with sliding scale torsemide.  Chronic 4-5 pillow orthopnea due to neck.  Occ dizziness.  She is getting a neck collar to help neck.    Echo today: RV dilated but normal systolic function.  LVEF 50%. Moderate to severe TR  Septal bounce RVSP 55-60. IVC smalll  ROS: All systems negative except as listed in HPI, PMH and Problem List.  Past Medical History  Diagnosis Date  . Pulmonary hypertension     s/p RHC 4/10 - PAP 86/29, wedge 21-23  . Gastric ulcer with hemorrhage   . Rheumatic fever     Childhood  . Coronary artery disease     Nonobstructive, LVEF 55%  . Mitral regurgitation     Moderate  . 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 Mitral stenosis with insufficiency, rheumatic   . Hypertension   . Heart murmur   . Stroke     12/11 [place],LEFT SIDE WEAKNESS  . Shortness of breath     EXERTION,PT. STATES SHE HAS PULMONARY HTN  .  Pneumonia 1990'S     SEVERAL EPISODES  . Asthma     PT. STATES MILD,USES INHALERS PRN,  . Asthma     PT. ON OXYGEN 2 L/[place]   . Diabetes mellitus   . Hypothyroidism   . Anemia, pernicious 1979  . Hepatitis     PT. STATES SHE HAD IT WHEN SHE WAS 14 YRS. OLD  . Blood transfusion 2008  . Arthritis   . GERD (gastroesophageal reflux disease)   . Fibromyalgia   . Squamous cell carcinoma     Left arm  . Breast cancer     2008 s/p chemo and radiation (last tx 4/09) s/p left mastectomy s/p 9 lymph node resection - [name][facility]  . CHF (congestive heart failure) 2010    Current Outpatient Prescriptions  Medication Sig Dispense Refill  . amitriptyline (ELAVIL) 75 MG tablet Take 75 mg by mouth at bedtime.      . hydrocodone-acetaminophen (LORCET PLUS) 7.5-650 MG per tablet Take 1 tablet by mouth every 6 (six) hours as needed. For pain  50 tablet  0  . letrozole (FEMARA) 2.5 MG tablet Take 1 tablet (2.5 mg total) by mouth daily.      Marland Kitchen levothyroxine (SYNTHROID, LEVOTHROID) 137 MCG tablet Take 137 mcg by mouth daily.       Marland Kitchen  lisinopril (PRINIVIL,ZESTRIL) 2.5 MG tablet Take 2.5 mg by mouth daily.      . metFORMIN (GLUCOPHAGE) 500 MG tablet Take 1,000 mg by mouth every evening.      . metoprolol (LOPRESSOR) 50 MG tablet Take 1 tablet (50 mg total) by mouth 2 (two) times daily.  60 tablet  1  . pantoprazole (PROTONIX) 40 MG tablet Take 1 tablet (40 mg total) by mouth daily.      . potassium chloride SA (K-DUR,KLOR-CON) 20 MEQ tablet Take 1 tablet (20 mEq total) by mouth daily.  30 tablet  6  . rosuvastatin (CRESTOR) 10 MG tablet Take 5 mg by mouth daily.      Marland Kitchen torsemide (DEMADEX) 20 MG tablet TAKE 40 MG (2 TABLETS) IN AM AND 20 MG (1 TABLET) IN PM  90 tablet  6  . zolpidem (AMBIEN) 5 MG tablet Take 5 mg by mouth at bedtime as needed.      Marland Kitchen amitriptyline (ELAVIL) 150 MG tablet Take 75 mg by mouth at bedtime.      . metFORMIN (GLUCOPHAGE) 500 MG tablet Take 500 mg by mouth every evening.        . metolazone (ZAROXOLYN) 2.5 MG tablet Take 2.5 mg by mouth as needed. HOLD medication if WT. Drops below 138 LBS       No current facility-administered medications for this encounter.     PHYSICAL EXAM: Filed Vitals:   02/17/13 1106 02/17/13 1111  BP:  104/60  Pulse:  60  Weight: 145 lb 8 oz (65.998 kg) 145 lb 8 oz (65.998 kg)  SpO2:  97%    General:  Chronically ill appearing.  No acute distress.  No resp difficulty with her husband.  HEENT: normal Neck: supple. JVP 5-6 with +CV waves. Carotids 2+ bilaterally; no bruits. No lymphadenopathy or thryomegaly appreciated. Cor: PMI normal. regular rate & rhythm. No rubs, gallops or 3/6 TR. soft P2. 2/6 AS murmur Lungs:  clear Abdomen: soft, obese,  nontender, mildly distended.No bruits or masses. Good bowel sounds. Extremities: R and L fingers cyanotic. No clubbing, rash, tr LE edema.  Neuro: alert & orientedx3, cranial nerves grossly intact. Moves all 4 extremities w/o difficulty. Affect pleasant.     ASSESSMENT & PLAN:

## 2013-02-24 NOTE — Assessment & Plan Note (Signed)
She is doing very well with her sliding scale diuretic regimen.We reviewed this again and reinforced importance. She is not candidate for re-do operation for TVR.

## 2013-03-26 ENCOUNTER — Other Ambulatory Visit: Payer: Self-pay

## 2013-03-26 ENCOUNTER — Inpatient Hospital Stay (HOSPITAL_COMMUNITY)
Admission: AD | Admit: 2013-03-26 | Discharge: 2013-04-04 | DRG: 224 | Disposition: A | Discharge status: Home / Self Care | Payer: Medicare Other | Source: Other Acute Inpatient Hospital | Attending: Internal Medicine | Admitting: Internal Medicine

## 2013-03-26 DIAGNOSIS — I071 Rheumatic tricuspid insufficiency: Secondary | ICD-10-CM

## 2013-03-26 DIAGNOSIS — Z9889 Other specified postprocedural states: Secondary | ICD-10-CM

## 2013-03-26 DIAGNOSIS — Z7901 Long term (current) use of anticoagulants: Secondary | ICD-10-CM

## 2013-03-26 DIAGNOSIS — I509 Heart failure, unspecified: Secondary | ICD-10-CM | POA: Diagnosis present

## 2013-03-26 DIAGNOSIS — I5042 Chronic combined systolic (congestive) and diastolic (congestive) heart failure: Secondary | ICD-10-CM

## 2013-03-26 DIAGNOSIS — I69998 Other sequelae following unspecified cerebrovascular disease: Secondary | ICD-10-CM

## 2013-03-26 DIAGNOSIS — I471 Supraventricular tachycardia, unspecified: Secondary | ICD-10-CM

## 2013-03-26 DIAGNOSIS — Z853 Personal history of malignant neoplasm of breast: Secondary | ICD-10-CM

## 2013-03-26 DIAGNOSIS — J309 Allergic rhinitis, unspecified: Secondary | ICD-10-CM

## 2013-03-26 DIAGNOSIS — Z85828 Personal history of other malignant neoplasm of skin: Secondary | ICD-10-CM

## 2013-03-26 DIAGNOSIS — I639 Cerebral infarction, unspecified: Secondary | ICD-10-CM

## 2013-03-26 DIAGNOSIS — R930 Abnormal findings on diagnostic imaging of skull and head, not elsewhere classified: Secondary | ICD-10-CM

## 2013-03-26 DIAGNOSIS — R110 Nausea: Secondary | ICD-10-CM

## 2013-03-26 DIAGNOSIS — R0602 Shortness of breath: Secondary | ICD-10-CM

## 2013-03-26 DIAGNOSIS — I4901 Ventricular fibrillation: Principal | ICD-10-CM

## 2013-03-26 DIAGNOSIS — C801 Malignant (primary) neoplasm, unspecified: Secondary | ICD-10-CM

## 2013-03-26 DIAGNOSIS — I2789 Other specified pulmonary heart diseases: Secondary | ICD-10-CM

## 2013-03-26 DIAGNOSIS — I079 Rheumatic tricuspid valve disease, unspecified: Secondary | ICD-10-CM | POA: Diagnosis present

## 2013-03-26 DIAGNOSIS — Z954 Presence of other heart-valve replacement: Secondary | ICD-10-CM

## 2013-03-26 DIAGNOSIS — E876 Hypokalemia: Secondary | ICD-10-CM

## 2013-03-26 DIAGNOSIS — N189 Chronic kidney disease, unspecified: Secondary | ICD-10-CM | POA: Diagnosis present

## 2013-03-26 DIAGNOSIS — I5082 Biventricular heart failure: Secondary | ICD-10-CM

## 2013-03-26 DIAGNOSIS — E039 Hypothyroidism, unspecified: Secondary | ICD-10-CM | POA: Diagnosis present

## 2013-03-26 DIAGNOSIS — I472 Ventricular tachycardia, unspecified: Secondary | ICD-10-CM | POA: Diagnosis present

## 2013-03-26 DIAGNOSIS — I4729 Other ventricular tachycardia: Secondary | ICD-10-CM | POA: Diagnosis present

## 2013-03-26 DIAGNOSIS — Z952 Presence of prosthetic heart valve: Secondary | ICD-10-CM

## 2013-03-26 DIAGNOSIS — IMO0001 Reserved for inherently not codable concepts without codable children: Secondary | ICD-10-CM | POA: Diagnosis present

## 2013-03-26 DIAGNOSIS — J45909 Unspecified asthma, uncomplicated: Secondary | ICD-10-CM

## 2013-03-26 DIAGNOSIS — I050 Rheumatic mitral stenosis: Secondary | ICD-10-CM | POA: Diagnosis present

## 2013-03-26 DIAGNOSIS — I2510 Atherosclerotic heart disease of native coronary artery without angina pectoris: Secondary | ICD-10-CM

## 2013-03-26 DIAGNOSIS — C50919 Malignant neoplasm of unspecified site of unspecified female breast: Secondary | ICD-10-CM

## 2013-03-26 DIAGNOSIS — R109 Unspecified abdominal pain: Secondary | ICD-10-CM

## 2013-03-26 DIAGNOSIS — K219 Gastro-esophageal reflux disease without esophagitis: Secondary | ICD-10-CM | POA: Diagnosis present

## 2013-03-26 DIAGNOSIS — M129 Arthropathy, unspecified: Secondary | ICD-10-CM | POA: Diagnosis present

## 2013-03-26 DIAGNOSIS — I5032 Chronic diastolic (congestive) heart failure: Secondary | ICD-10-CM

## 2013-03-26 DIAGNOSIS — R570 Cardiogenic shock: Secondary | ICD-10-CM

## 2013-03-26 DIAGNOSIS — R197 Diarrhea, unspecified: Secondary | ICD-10-CM

## 2013-03-26 DIAGNOSIS — R4182 Altered mental status, unspecified: Secondary | ICD-10-CM

## 2013-03-26 DIAGNOSIS — I129 Hypertensive chronic kidney disease with stage 1 through stage 4 chronic kidney disease, or unspecified chronic kidney disease: Secondary | ICD-10-CM | POA: Diagnosis present

## 2013-03-26 DIAGNOSIS — I00 Rheumatic fever without heart involvement: Secondary | ICD-10-CM

## 2013-03-26 DIAGNOSIS — E119 Type 2 diabetes mellitus without complications: Secondary | ICD-10-CM | POA: Diagnosis present

## 2013-03-26 DIAGNOSIS — M25551 Pain in right hip: Secondary | ICD-10-CM

## 2013-03-26 DIAGNOSIS — R0902 Hypoxemia: Secondary | ICD-10-CM

## 2013-03-26 DIAGNOSIS — Z8679 Personal history of other diseases of the circulatory system: Secondary | ICD-10-CM

## 2013-03-26 DIAGNOSIS — R932 Abnormal findings on diagnostic imaging of liver and biliary tract: Secondary | ICD-10-CM

## 2013-03-26 DIAGNOSIS — I4721 Torsades de pointes: Secondary | ICD-10-CM

## 2013-03-26 DIAGNOSIS — I5033 Acute on chronic diastolic (congestive) heart failure: Secondary | ICD-10-CM | POA: Diagnosis present

## 2013-03-26 DIAGNOSIS — I498 Other specified cardiac arrhythmias: Secondary | ICD-10-CM | POA: Diagnosis present

## 2013-03-26 DIAGNOSIS — I4891 Unspecified atrial fibrillation: Secondary | ICD-10-CM

## 2013-03-26 DIAGNOSIS — K250 Acute gastric ulcer with hemorrhage: Secondary | ICD-10-CM

## 2013-03-26 DIAGNOSIS — I469 Cardiac arrest, cause unspecified: Secondary | ICD-10-CM

## 2013-03-26 DIAGNOSIS — D510 Vitamin B12 deficiency anemia due to intrinsic factor deficiency: Secondary | ICD-10-CM | POA: Diagnosis present

## 2013-03-26 LAB — CBC
HCT: 39.1 % (ref 36.0–46.0)
MCV: 87.7 fL (ref 78.0–100.0)
RBC: 4.46 MIL/uL (ref 3.87–5.11)
WBC: 18.5 10*3/uL — ABNORMAL HIGH (ref 4.0–10.5)

## 2013-03-26 LAB — BASIC METABOLIC PANEL
BUN: 18 mg/dL (ref 6–23)
Calcium: 9.2 mg/dL (ref 8.4–10.5)
Creatinine, Ser: 1.08 mg/dL (ref 0.50–1.10)
GFR calc non Af Amer: 51 mL/min — ABNORMAL LOW (ref >90)
Glucose, Bld: 134 mg/dL — ABNORMAL HIGH (ref 70–99)
Sodium: 139 mEq/L (ref 135–145)

## 2013-03-26 LAB — MRSA PCR SCREENING: MRSA by PCR: NEGATIVE

## 2013-03-26 MED ORDER — PANTOPRAZOLE SODIUM 40 MG PO TBEC
40.0000 mg | DELAYED_RELEASE_TABLET | Freq: Every evening | ORAL | Status: DC
  Administered 2013-03-26: 40 mg via ORAL
  Filled 2013-03-26: qty 1

## 2013-03-26 MED ORDER — ROSUVASTATIN CALCIUM 5 MG PO TABS
5.0000 mg | ORAL_TABLET | Freq: Every day | ORAL | Status: DC
  Administered 2013-03-27 – 2013-04-03 (×6): 5 mg via ORAL
  Filled 2013-03-26 (×10): qty 1

## 2013-03-26 MED ORDER — TORSEMIDE 20 MG PO TABS
40.0000 mg | ORAL_TABLET | Freq: Every day | ORAL | Status: DC
  Filled 2013-03-26 (×2): qty 2

## 2013-03-26 MED ORDER — LETROZOLE 2.5 MG PO TABS
2.5000 mg | ORAL_TABLET | Freq: Every day | ORAL | Status: DC
  Administered 2013-03-27 – 2013-04-04 (×9): 2.5 mg via ORAL
  Filled 2013-03-26 (×10): qty 1

## 2013-03-26 MED ORDER — SODIUM CHLORIDE 0.9 % IV SOLN
250.0000 mL | INTRAVENOUS | Status: DC | PRN
  Administered 2013-03-27: 250 mL via INTRAVENOUS

## 2013-03-26 MED ORDER — SODIUM CHLORIDE 0.9 % IJ SOLN: 3.0000 mL | INTRAMUSCULAR | Status: DC | PRN

## 2013-03-26 MED ORDER — METOPROLOL TARTRATE 50 MG PO TABS
50.0000 mg | ORAL_TABLET | Freq: Two times a day (BID) | ORAL | Status: DC
  Administered 2013-03-26: 50 mg via ORAL
  Filled 2013-03-26 (×3): qty 1

## 2013-03-26 MED ORDER — TRAZODONE HCL 50 MG PO TABS
50.0000 mg | ORAL_TABLET | Freq: Every day | ORAL | Status: DC
  Administered 2013-03-26: 50 mg via ORAL
  Filled 2013-03-26 (×2): qty 1

## 2013-03-26 MED ORDER — LISINOPRIL 2.5 MG PO TABS
2.5000 mg | ORAL_TABLET | Freq: Every day | ORAL | Status: DC
  Filled 2013-03-26: qty 1

## 2013-03-26 MED ORDER — ATORVASTATIN CALCIUM 10 MG PO TABS: 10.0000 mg | ORAL_TABLET | Freq: Every day | ORAL | Status: DC

## 2013-03-26 MED ORDER — ALPRAZOLAM 0.25 MG PO TABS
0.2500 mg | ORAL_TABLET | Freq: Two times a day (BID) | ORAL | Status: DC | PRN
  Administered 2013-03-27 – 2013-04-03 (×11): 0.25 mg via ORAL
  Filled 2013-03-26 (×12): qty 1

## 2013-03-26 MED ORDER — LEVOTHYROXINE SODIUM 137 MCG PO TABS
137.0000 ug | ORAL_TABLET | Freq: Every day | ORAL | Status: DC
  Administered 2013-03-27 – 2013-04-04 (×8): 137 ug via ORAL
  Filled 2013-03-26 (×11): qty 1

## 2013-03-26 MED ORDER — ENOXAPARIN SODIUM 40 MG/0.4ML ~~LOC~~ SOLN
40.0000 mg | SUBCUTANEOUS | Status: DC
  Administered 2013-03-26 – 2013-03-29 (×4): 40 mg via SUBCUTANEOUS
  Filled 2013-03-26 (×5): qty 0.4

## 2013-03-26 MED ORDER — METFORMIN HCL 500 MG PO TABS
1000.0000 mg | ORAL_TABLET | Freq: Every day | ORAL | Status: DC
  Administered 2013-03-28: 1000 mg via ORAL
  Filled 2013-03-26 (×3): qty 2

## 2013-03-26 MED ORDER — TORSEMIDE 20 MG PO TABS
20.0000 mg | ORAL_TABLET | Freq: Every day | ORAL | Status: DC
  Administered 2013-03-26: 20 mg via ORAL
  Filled 2013-03-26 (×2): qty 1

## 2013-03-26 MED ORDER — ZOLPIDEM TARTRATE 5 MG PO TABS
5.0000 mg | ORAL_TABLET | Freq: Every day | ORAL | Status: DC
  Administered 2013-03-26 – 2013-04-03 (×9): 5 mg via ORAL
  Filled 2013-03-26 (×9): qty 1

## 2013-03-26 MED ORDER — METFORMIN HCL 500 MG PO TABS
500.0000 mg | ORAL_TABLET | Freq: Every day | ORAL | Status: DC
  Filled 2013-03-26: qty 1

## 2013-03-26 MED ORDER — POTASSIUM CHLORIDE CRYS ER 20 MEQ PO TBCR
40.0000 meq | EXTENDED_RELEASE_TABLET | Freq: Two times a day (BID) | ORAL | Status: DC
  Administered 2013-03-26: 40 meq via ORAL
  Filled 2013-03-26 (×3): qty 2

## 2013-03-26 MED ORDER — ACETAMINOPHEN 325 MG PO TABS
650.0000 mg | ORAL_TABLET | ORAL | Status: DC | PRN
Start: 2013-03-26 — End: 2013-04-04
  Administered 2013-03-29: 650 mg via ORAL
  Filled 2013-03-26: qty 2

## 2013-03-26 MED ORDER — SODIUM CHLORIDE 0.9 % IJ SOLN
3.0000 mL | Freq: Two times a day (BID) | INTRAMUSCULAR | Status: DC
  Administered 2013-03-26 – 2013-04-03 (×12): 3 mL via INTRAVENOUS

## 2013-03-26 MED ORDER — ONDANSETRON HCL 4 MG/2ML IJ SOLN
4.0000 mg | Freq: Four times a day (QID) | INTRAMUSCULAR | Status: DC | PRN
  Administered 2013-03-26 – 2013-03-27 (×2): 4 mg via INTRAVENOUS
  Filled 2013-03-26 (×2): qty 2

## 2013-03-26 MED ORDER — HYDROCODONE-ACETAMINOPHEN 7.5-325 MG/15ML PO SOLN
10.0000 mL | Freq: Four times a day (QID) | ORAL | Status: DC | PRN
  Administered 2013-03-26 – 2013-04-03 (×9): 10 mL via ORAL
  Filled 2013-03-26 (×10): qty 15

## 2013-03-26 NOTE — Progress Notes (Signed)
Called E-link nurse to give critical trop of 0.75, no new orders at this time

## 2013-03-26 NOTE — H&P (Signed)
History and Physical   Patient ID: [name] MRN: [id number], DOB/AGE: Sep 07, 1941   Admit date: 03/26/2013 Date of Consult: 03/26/2013  Primary Physician: Alton Revere, MD Primary Cardiologist: D. Jezebelle Ledwell, MD  HPI: [name] is a 72 y.o. female 72 y.o. 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 She has had recurrent severe TR and evaluated for repeat TV surgery. However thought to be too frail so she has been managed medically with careful attention to her diuretic regimen. She was last seen 02/2013 and noted to be stable. She has not tolerated Revatio in the past for [facility].   2D echo 02/17/13: EF 35-40%, mild AI, bioprosthetic MV with normal functionality, moderate TR, PASP 58 mmHg.   LHC 07/2011: minimal nonobstructive CAD, 08/12/12 RHC  RA = 15 v= 21  RV = 64/3/13  PA = 59/16 (33)  PCW = 14  Fick cardiac output/index = 4.1/2.4  PVR = 7.8 Woods  FA sat = 98%  PA sat = 60%, 63%   Over the weekend her weight was up 5 pounds and took extra doses of demadex which didn't work well so she took a dose of metolazone. She had felt ill recently c/o diarrhea x 2 days with associated dizziness. No fevers or chills. Husband recently had similar episodes of diarrhea. She underwent scheduled renal ultrasound today for underlying CKD.   While at hospital had witnessed VF arrest. Rescucitated very quickly. She was transferred to the ED, given prophylactic Mg (Mg 1.7) and given an amio bolus + infusion. She was  noted to be markedly hypokalemic (K 2.4), which was repleted aggressively (260 meq IV K). She was also heparinized. EKG there revealed accelerated junctional rhythm with RBBB, LAFB. WBC 13.8. BUN 17/Cr 1.06. Trop-I 0.03.   Currently awake and alert, hemodynamically stable and in NAD. No complaints. Labs pending  Problem List: Past Medical History  Diagnosis Date  . Pulmonary hypertension     s/p RHC 4/10 - PAP 86/29, wedge 21-23  . Gastric ulcer with hemorrhage   . Rheumatic fever     Childhood  . Coronary artery disease     Nonobstructive, LVEF 55%  . Mitral regurgitation     Moderate  . 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 Mitral stenosis with insufficiency, rheumatic   . Hypertension   . Heart murmur   . Stroke     12/11 [place],LEFT SIDE WEAKNESS  . Shortness of breath     EXERTION,PT. STATES SHE HAS PULMONARY HTN  . Pneumonia 1990'S     SEVERAL EPISODES  . Asthma     PT. STATES MILD,USES INHALERS PRN,  . Asthma     PT. ON OXYGEN 2 L/[place]   . Diabetes mellitus   . Hypothyroidism   . Anemia, pernicious 1979  . Hepatitis     PT. STATES SHE HAD IT WHEN SHE WAS 14 YRS. OLD  . Blood transfusion 2008  . Arthritis   . GERD (gastroesophageal reflux disease)   . Fibromyalgia   . Squamous cell carcinoma  Left arm  . Breast cancer     2008 s/p chemo and radiation (last tx 4/09) s/p left mastectomy s/p 9 lymph node resection - [name], [facility]  . CHF (congestive heart failure) 2010    Past Surgical History  Procedure Laterality Date  . Left mastectomy    . Partial gastrectomy    . Tonsillectomy    . Cardiac catheterization  07/2011  . Mastectomy  2008    LEFT  . Maze  09/24/2011    Procedure: MAZE (complete biatrial lesion set using RF and cryo with clipping of LA appendage);  Surgeon: Purcell Nails, MD;  Location: [facility] OR;  Service: Open Heart Surgery;  Laterality: N/A;  . Mitral valve replacement  09/24/2011    Procedure: MITRAL VALVE (MV)  REPLACEMENT (#9mm Medtronic Mosaic bioprosthetic tissue valve);  Surgeon: Purcell Nails, MD;  Location: [facility] OR;  Service: Open Heart Surgery;  Laterality: N/A;  . Tricuspid valvuloplasty  09/24/2011    #103mm Edwards mc3 ring annuloplasty     Allergies:  Allergies  Allergen Reactions  . Codeine Nausea And Vomiting  . Butorphanol Tartrate     REACTION: migraines, nervous/agitated  . Lipitor (Atorvastatin Calcium) Diarrhea    nightmares  . Revatio (Sildenafil Citrate) Swelling    Home Medications: Prior to Admission medications   Medication Sig Start Date End Date Taking? Authorizing Provider  amitriptyline (ELAVIL) 150 MG tablet Take 75 mg by mouth at bedtime. 10/15/11 12/21/12  Wayne E Gold, PA-C  amitriptyline (ELAVIL) 75 MG tablet Take 75 mg by mouth at bedtime.    Historical Provider, MD  hydrocodone-acetaminophen (LORCET PLUS) 7.5-650 MG per tablet Take 1 tablet by mouth every 6 (six) hours as needed. For pain 10/15/11   Rowe Clack, PA-C  letrozole [facility]) 2.5 MG tablet Take 1 tablet (2.5 mg total) by mouth daily. 10/10/11   Wayne E Gold, PA-C  levothyroxine (SYNTHROID, LEVOTHROID) 137 MCG tablet Take 137 mcg by mouth daily.  10/10/11   Wayne E Gold, PA-C  lisinopril (PRINIVIL,ZESTRIL) 2.5 MG tablet Take 2.5 mg by mouth daily.    Historical Provider, MD  metFORMIN (GLUCOPHAGE) 500 MG tablet Take 500 mg by mouth every evening.  10/28/11 12/21/12  Purcell Nails, MD  metFORMIN (GLUCOPHAGE) 500 MG tablet Take 1,000 mg by mouth every evening.    Historical Provider, MD  metolazone (ZAROXOLYN) 2.5 MG tablet Take 2.5 mg by mouth as needed. HOLD medication if WT. Drops below 138 LBS 02/24/12 02/23/13  Amy D Clegg, NP  metoprolol (LOPRESSOR) 50 MG tablet Take 1 tablet (50 mg total) by mouth 2 (two) times daily. 10/15/11   Wayne E Gold, PA-C  pantoprazole (PROTONIX) 40 MG tablet Take 1 tablet (40 mg total) by mouth daily. 10/10/11   Wayne E Gold, PA-C  potassium chloride SA (K-DUR,KLOR-CON) 20  MEQ tablet Take 1 tablet (20 mEq total) by mouth daily. 01/27/13   Hadassah Pais, PA-C  rosuvastatin (CRESTOR) 10 MG tablet Take 5 mg by mouth daily. 10/10/11   Wayne E Gold, PA-C  torsemide (DEMADEX) 20 MG tablet TAKE 40 MG (2 TABLETS) IN AM AND 20 MG (1 TABLET) IN PM 02/03/13   Dolores Patty, MD  zolpidem (AMBIEN) 5 MG tablet Take 5 mg by mouth at bedtime as needed.    Historical Provider, MD    Inpatient Medications:   Prescriptions prior to admission  Medication Sig Dispense Refill  . amitriptyline (ELAVIL) 150 MG tablet Take 75 mg by mouth at  bedtime.      Marland Kitchen amitriptyline (ELAVIL) 75 MG tablet Take 75 mg by mouth at bedtime.      . hydrocodone-acetaminophen (LORCET PLUS) 7.5-650 MG per tablet Take 1 tablet by mouth every 6 (six) hours as needed. For pain  50 tablet  0  . letrozole (FEMARA) 2.5 MG tablet Take 1 tablet (2.5 mg total) by mouth daily.      Marland Kitchen levothyroxine (SYNTHROID, LEVOTHROID) 137 MCG tablet Take 137 mcg by mouth daily.       Marland Kitchen lisinopril (PRINIVIL,ZESTRIL) 2.5 MG tablet Take 2.5 mg by mouth daily.      . metFORMIN (GLUCOPHAGE) 500 MG tablet Take 500 mg by mouth every evening.       . metFORMIN (GLUCOPHAGE) 500 MG tablet Take 1,000 mg by mouth every evening.      . metolazone (ZAROXOLYN) 2.5 MG tablet Take 2.5 mg by mouth as needed. HOLD medication if WT. Drops below 138 LBS      . metoprolol (LOPRESSOR) 50 MG tablet Take 1 tablet (50 mg total) by mouth 2 (two) times daily.  60 tablet  1  . pantoprazole (PROTONIX) 40 MG tablet Take 1 tablet (40 mg total) by mouth daily.      . potassium chloride SA (K-DUR,KLOR-CON) 20 MEQ tablet Take 1 tablet (20 mEq total) by mouth daily.  30 tablet  6  . rosuvastatin (CRESTOR) 10 MG tablet Take 5 mg by mouth daily.      Marland Kitchen torsemide (DEMADEX) 20 MG tablet TAKE 40 MG (2 TABLETS) IN AM AND 20 MG (1 TABLET) IN PM  90 tablet  6  . zolpidem (AMBIEN) 5 MG tablet Take 5 mg by mouth at bedtime as needed.        Family History  Problem  Relation Age of Onset  . Heart disease Mother   . Colon cancer Mother      History   Social History  . Marital Status: Married    Spouse Name: N/A    Number of Children: 2  . Years of Education: N/A   Occupational History  . Unemployed     High school education   Social History Main Topics  . Smoking status: Never Smoker   . Smokeless tobacco: Never Used  . Alcohol Use: No  . Drug Use: No  . Sexually Active: Not on file   Other Topics Concern  . Not on file   Social History Narrative  . No narrative on file     Review of Systems: General: negative for chills, fever, night sweats or weight changes.  Cardiovascular: negative for chest pain, dyspnea on exertion, edema, orthopnea, palpitations, paroxysmal nocturnal dyspnea or shortness of breath Dermatological: negative for rash Respiratory: negative for cough or wheezing Urologic: negative for hematuria Abdominal: positive for diarrhea, nausea, negative vomiting, bright red blood per rectum, melena, or hematemesis Neurologic: positive for dizziness, negative for visual changes, syncope All other systems reviewed and are otherwise negative except as noted above.  Physical Exam: Blood pressure 103/54, pulse 55, temperature 100.3 F (37.9 C), temperature source Oral, resp. rate 15, height 5\' 1"  (1.549 m), weight 65.2 kg (143 lb 11.8 oz), SpO2 100.00%.    General: Chronically ill appearing. No acute distress. No resp difficulty with her husband.  HEENT: normal  Neck: supple. JVP 5-6 with +CV waves. Carotids 2+ bilaterally; no bruits. No lymphadenopathy or thryomegaly appreciated.  Cor: PMI normal. regular rate & rhythm. No rubs, gallops or 3/6 TR. soft P2. 2/6  AS murmur  Lungs: clear  Abdomen: soft, obese, nontender, mildly distended.No bruits or masses. Good bowel sounds.  Extremities: R and L fingers cyanotic. No clubbing, rash, tr LE edema.  Neuro: alert & orientedx3, cranial nerves grossly intact. Moves all 4  extremities w/o difficulty. Affect pleasant.   Labs:  No results found for this or any previous visit (from the past 24 hour(s)).  Pending  Radiology/Studies: No results found.  EKG: accelerated junctional rhythm, 76 bpm, LAFB, RBBB Telemetry: NSR, 60 bpm, RBBB  ASSESSMENT AND PLAN:   72 y.o. 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  1. VF arrest 2. Hypokalemia, severe     --due to diarrhea and diuretics 3. Diarrhea  - ? Acute gastroenteritis 4. PAH with biventricular heart failure 5. H/o atrial fibrillation 6. Rheumatic valvular disease- MS/MR s/p bioprosthetic MVR, TR s/p TV repair with recurrent mod-severe TR 7. CKD 8. H/o CVA with R-sided weakness 9. CAD, minimal nonobstructive 10. H/o L breast CA s/p mastectomy/chemo/XRT 2008 11. Type 2 DM 12. HTN 13. Junctional rhythm - now NSR 14. Hypomagnesemia  Signed, R. Hurman Horn, PA-C 03/26/2013, 5:56 PM   Plan/Discussion:  Patient seen and examined independently. Metro Kung, NP note reviewed carefully - agree with his assessment and plan. I have edited the note based on my findings.   [name] is well known to me from clinic she is now admitted with VF arrest in the setting of severe hypokalemia due to an increase in her diuretics and recent diarrhea. Her K+ and Mag have been repleted aggressively. Will recheck now. Will also recheck echo. Resume home diuretic regimen. Stressed need to take extra potassium whenever she increases her diuretics.   Suspect she will be able to go home on Sunday. Would arrange [facility] for at  least 1 month to check labs and f/u on her at least weekly.  Truman Hayward 6:57 PM

## 2013-03-27 ENCOUNTER — Inpatient Hospital Stay (HOSPITAL_COMMUNITY): Payer: Medicare Other

## 2013-03-27 DIAGNOSIS — I509 Heart failure, unspecified: Secondary | ICD-10-CM

## 2013-03-27 DIAGNOSIS — R570 Cardiogenic shock: Secondary | ICD-10-CM

## 2013-03-27 DIAGNOSIS — I4891 Unspecified atrial fibrillation: Secondary | ICD-10-CM

## 2013-03-27 DIAGNOSIS — I369 Nonrheumatic tricuspid valve disorder, unspecified: Secondary | ICD-10-CM

## 2013-03-27 LAB — BASIC METABOLIC PANEL
BUN: 26 mg/dL — ABNORMAL HIGH (ref 6–23)
CO2: 21 mEq/L (ref 19–32)
CO2: 21 mEq/L (ref 19–32)
Calcium: 8.2 mg/dL — ABNORMAL LOW (ref 8.4–10.5)
Calcium: 7.8 mg/dL — ABNORMAL LOW (ref 8.4–10.5)
Chloride: 100 mEq/L (ref 96–112)
Creatinine, Ser: 1.38 mg/dL — ABNORMAL HIGH (ref 0.50–1.10)
Creatinine, Ser: 1.51 mg/dL — ABNORMAL HIGH (ref 0.50–1.10)
GFR calc Af Amer: 40 mL/min — ABNORMAL LOW (ref >90)
GFR calc non Af Amer: 36 mL/min — ABNORMAL LOW (ref >90)
Glucose, Bld: 142 mg/dL — ABNORMAL HIGH (ref 70–99)
Sodium: 138 mEq/L (ref 135–145)
Sodium: 135 mEq/L (ref 135–145)

## 2013-03-27 LAB — GLUCOSE, CAPILLARY
Glucose-Capillary: 138 mg/dL — ABNORMAL HIGH (ref 70–99)
Glucose-Capillary: 132 mg/dL — ABNORMAL HIGH (ref 70–99)

## 2013-03-27 LAB — TROPONIN I
Troponin I: 0.4 ng/mL (ref <0.30)
Troponin I: <0.3 ng/mL (ref <0.30)

## 2013-03-27 LAB — MAGNESIUM
Magnesium: 2.2 mg/dL (ref 1.5–2.5)
Magnesium: 2.1 mg/dL (ref 1.5–2.5)

## 2013-03-27 LAB — PHOSPHORUS: Phosphorus: 2.5 mg/dL (ref 2.3–4.6)

## 2013-03-27 MED ORDER — NOREPINEPHRINE BITARTRATE 1 MG/ML IJ SOLN
2.0000 ug/min | INTRAVENOUS | Status: DC
  Administered 2013-03-27: 5 ug/min via INTRAVENOUS
  Filled 2013-03-27: qty 16

## 2013-03-27 MED ORDER — WHITE PETROLATUM GEL
Status: AC
  Filled 2013-03-27: qty 5

## 2013-03-27 MED ORDER — SODIUM CHLORIDE 0.9 % IV SOLN
INTRAVENOUS | Status: DC
  Administered 2013-03-27 – 2013-03-28 (×4): via INTRAVENOUS

## 2013-03-27 MED ORDER — SODIUM CHLORIDE 0.9 % IV SOLN
INTRAVENOUS | Status: DC | PRN
  Administered 2013-03-27 – 2013-03-28 (×2): via INTRAVENOUS

## 2013-03-27 MED ORDER — PROCHLORPERAZINE EDISYLATE 5 MG/ML IJ SOLN
10.0000 mg | Freq: Four times a day (QID) | INTRAMUSCULAR | Status: DC | PRN
  Administered 2013-03-27 – 2013-04-02 (×10): 10 mg via INTRAVENOUS
  Filled 2013-03-27 (×12): qty 2

## 2013-03-27 MED ORDER — LIDOCAINE IN D5W 4-5 MG/ML-% IV SOLN
1.0000 mg/min | INTRAVENOUS | Status: DC
  Administered 2013-03-27 (×2): 1 mg/min via INTRAVENOUS
  Administered 2013-03-27: 2 mg/min via INTRAVENOUS
  Administered 2013-03-28: 1.5 mg/min via INTRAVENOUS
  Filled 2013-03-27 (×3): qty 500

## 2013-03-27 MED ORDER — LIDOCAINE IN D5W 4-5 MG/ML-% IV SOLN
1.0000 mg/min | INTRAVENOUS | Status: DC
  Filled 2013-03-27: qty 250

## 2013-03-27 MED ORDER — AMIODARONE HCL IN DEXTROSE 360-4.14 MG/200ML-% IV SOLN
60.0000 mg/h | INTRAVENOUS | Status: DC
  Administered 2013-03-27: 60 mg/h via INTRAVENOUS

## 2013-03-27 MED ORDER — AMIODARONE HCL IN DEXTROSE 360-4.14 MG/200ML-% IV SOLN
INTRAVENOUS | Status: AC
  Administered 2013-03-27: 200 mL
  Filled 2013-03-27: qty 200

## 2013-03-27 MED ORDER — METOCLOPRAMIDE HCL 5 MG/ML IJ SOLN
10.0000 mg | Freq: Four times a day (QID) | INTRAMUSCULAR | Status: DC | PRN
  Administered 2013-03-27 – 2013-04-03 (×10): 10 mg via INTRAVENOUS
  Filled 2013-03-27 (×10): qty 2

## 2013-03-27 MED ORDER — SODIUM CHLORIDE 0.9 % IV BOLUS (SEPSIS)
200.0000 mL | Freq: Once | INTRAVENOUS | Status: AC
  Administered 2013-03-27: 200 mL via INTRAVENOUS

## 2013-03-27 MED ORDER — DOPAMINE-DEXTROSE 3.2-5 MG/ML-% IV SOLN
INTRAVENOUS | Status: AC
  Administered 2013-03-27: 5 ug/kg/min via INTRAVENOUS
  Filled 2013-03-27: qty 250

## 2013-03-27 MED ORDER — DOPAMINE-DEXTROSE 3.2-5 MG/ML-% IV SOLN
5.0000 ug/kg/min | INTRAVENOUS | Status: DC
  Administered 2013-03-29: 5 ug/kg/min via INTRAVENOUS
  Filled 2013-03-27: qty 250

## 2013-03-27 MED ORDER — SODIUM CHLORIDE 0.9 % IV SOLN: 250.0000 mL | INTRAVENOUS | Status: DC | PRN

## 2013-03-27 MED ORDER — AMIODARONE LOAD VIA INFUSION
150.0000 mg | Freq: Once | INTRAVENOUS | Status: AC
  Administered 2013-03-27: 150 mg via INTRAVENOUS
  Filled 2013-03-27: qty 83.34

## 2013-03-27 MED ORDER — ISOPROTERENOL HCL 0.2 MG/ML IJ SOLN
1.0000 ug/min | INTRAVENOUS | Status: DC
  Administered 2013-03-27: 1 ug/min via INTRAVENOUS
  Filled 2013-03-27: qty 5

## 2013-03-27 MED ORDER — POTASSIUM CHLORIDE 10 MEQ/100ML IV SOLN
INTRAVENOUS | Status: AC
  Administered 2013-03-27: 10 meq
  Filled 2013-03-27: qty 100

## 2013-03-27 MED ORDER — AMIODARONE HCL IN DEXTROSE 360-4.14 MG/200ML-% IV SOLN
30.0000 mg/h | INTRAVENOUS | Status: DC
  Filled 2013-03-27: qty 200

## 2013-03-27 NOTE — Plan of Care (Signed)
Cardiology Note  Mr. [name] was admitted yesterday s/p V Fib Arrest.  Stable after initial resuscitation without needing any continued drips here.  Hypokalemia was repleted and remained >4.0 on repeat here.  Started having runs of VT associated with chest discomfort and nausea.  Once again repeat labs were normal.  She was initially given Amio bolus and started on gtt.  Her SBP decreased to 70s.  I discontinued Amiodarone and gave fluid bolus.  She has recently had bout of diarrhea which may have precipitated low K and dehydration.  Her blood pressure has stabilized and symptoms resolved.  However, baseline rhythm remains concerning.  Has junctional rhythm, ventricular bigeminy, with occasional runs of NSVT.  Will discuss with LB team this am.

## 2013-03-27 NOTE — Procedures (Signed)
Central Venous Catheter Insertion Procedure Note ROCHELE LUECK [id number] Mar 23, 1941  Procedure: Insertion of Central Venous Catheter Indications: Drug and/or fluid administration  Procedure Details Consent: Risks of procedure as well as the alternatives and risks of each were explained to the (patient/caregiver).  Consent for procedure obtained. Time Out: Verified patient identification, verified procedure, site/side was marked, verified correct patient position, special equipment/implants available, medications/allergies/relevent history reviewed, required imaging and test results available.  Performed  Maximum sterile technique was used including antiseptics, cap, gloves, gown, hand hygiene, mask and sheet. Skin prep: Chlorhexidine; local anesthetic administered A antimicrobial bonded/coated triple lumen catheter was placed in the left internal jugular vein using the Seldinger technique.  Ultrasound was used to verify the patency of the vein and for real time needle guidance.  Evaluation Blood flow good Complications: No apparent complications Patient did tolerate procedure well. Chest X-ray ordered to verify placement.  CXR: pending.  MCQUAID, DOUGLAS 03/27/2013, 4:13 PM

## 2013-03-27 NOTE — Progress Notes (Signed)
  Echocardiogram 2D Echocardiogram has been performed.  [name] 03/27/2013, 10:24 AM

## 2013-03-27 NOTE — Progress Notes (Signed)
eLink Physician-Brief Progress Note Patient Name: [name] DOB: 08-06-1941 MRN: [id number]  Date of Service  03/27/2013   HPI/Events of Note   Multiple runs of VT K 4.4  Mg 2.5 Trop 0.55   eICU Interventions   Amiodarone bolus and gtt Trend troponin RN to notify Cardiology    Intervention Category Major Interventions: Arrhythmia - evaluation and management  Hanif Radin 03/27/2013, 4:08 AM

## 2013-03-27 NOTE — Progress Notes (Signed)
Paged Dr Terressa Koyanagi 2 more times due to patient starting to have some pauses in her rhythm now.

## 2013-03-27 NOTE — Progress Notes (Signed)
Patient Name: [name]      SUBJECTIVE: without complaints   Past Medical History  Diagnosis Date  . Pulmonary hypertension     s/p RHC 4/10 - PAP 86/29, wedge 21-23  . Gastric ulcer with hemorrhage   . Rheumatic fever     Childhood  . Coronary artery disease     Nonobstructive, LVEF 55%  . Mitral regurgitation     Moderate  . 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 Mitral stenosis with insufficiency, rheumatic   . Hypertension   . Heart murmur   . Stroke     12/11 [place],LEFT SIDE WEAKNESS  . Shortness of breath     EXERTION,PT. STATES SHE HAS PULMONARY HTN  . Pneumonia 1990'S     SEVERAL EPISODES  . Asthma     PT. STATES MILD,USES INHALERS PRN,  . Asthma     PT. ON OXYGEN 2 L/[place]   . Diabetes mellitus   . Hypothyroidism   . Anemia, pernicious 1979  . Hepatitis     PT. STATES SHE HAD IT WHEN SHE WAS 14 YRS. OLD  . Blood transfusion 2008  . Arthritis   . GERD (gastroesophageal reflux disease)   . Fibromyalgia   . Squamous cell carcinoma     Left arm  . Breast cancer     2008 s/p chemo and radiation (last tx 4/09) s/p left mastectomy s/p 9 lymph node resection - [name], [facility]  . CHF (congestive heart failure) 2010    PHYSICAL EXAM Filed Vitals:   03/27/13 0530 03/27/13 0600 03/27/13 0630 03/27/13 0700  BP: 95/33 93/32 91/31  86/41  Pulse: 33 61 31 29  Temp:      TempSrc:      Resp: 24 22 19 18   Height:      Weight:      SpO2: 100% 100% 100% 100%    Well developed and nourished in no acute distress HENT normal Neck supple with JVP-flat Clear Irregular rate  and rhythm, 2/6 systolic m Abd-soft with active BS No Clubbing cyanosis edema Skin-warm and dry A & Oriented  Grossly normal sensory and motor function  TELEMETRY: Reviewed telemetry pt in  [place] dependent ventricular ectopy    Intake/Output Summary (Last 24 hours) at 03/27/13 0750 Last data filed at 03/27/13 0450  Gross per 24 hour  Intake    943 ml    Output    650 ml  Net    293 ml    LABS: Basic Metabolic Panel:  Recent Labs Lab 03/26/13 1936  NA 139  K 4.4  CL 97  CO2 27  GLUCOSE 134*  BUN 18  CREATININE 1.08  CALCIUM 9.2  MG 2.5   Cardiac Enzymes:  Recent Labs  03/26/13 1936 03/27/13 0127 03/27/13 0440  TROPONINI 0.75* 0.55* 0.40*   CBC:  Recent Labs Lab 03/26/13 1936  WBC 18.5*  HGB 12.4  HCT 39.1  MCV 87.7  PLT 275   PROTIME: No results found for this basename: LABPROT, INR,  in the last 72 hours Liver Function Tests: No results found for this basename: AST, ALT, ALKPHOS, BILITOT, PROT, ALBUMIN,  in the last 72 hours No results found for this basename: LIPASE, AMYLASE,  in the last 72 hours BNP: BNP (last 3 results)  Recent Labs  03/26/13 1936  PROBNP 4733.0*   QT >650  ASSESSMENT AND PLAN:  Active Problems:   VF (ventricular fibrillation)   Cardiac arrest   Hypokalemia  QT prolongation Persistent pause dependent VE and VT-NS consisted with TdP physiology Treatment options are limited and we will  1) replete K- she should be totally body deplete 2) isuprel to increase HR to shorten QT 3) lido to shorten QT 4) may need transvenous pacing 5) volume support Can maybe use norepi if bp support needed  Signed, Sherryl Manges MD  03/27/2013

## 2013-03-27 NOTE — Progress Notes (Signed)
Dr Terressa Koyanagi up to see patient and look at printed strips of patients pauses. No new orders given at this time.

## 2013-03-27 NOTE — Progress Notes (Addendum)
Code [place] called by Charity fundraiser. Notified patient became unresponsive and was noted to be in [place], requiring one 120 J shock with return to NSR. Current BP 123/58 HR 74.  She was started on lidocaine this morning. A L IJ CVL was placed earlier today, CXR confirms appropriate placement. 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 Discussed with RN and apparently she was being switched from PIV to CVL and lidocaine gtt was off for less than a minute. At this time will have medicine team contact cardiology on call, re-bolus lidocaine, and continue gtt. Latest BMP shows a K of 3.7. Mg from this morning is 2.2. RN states earlier today was having episodes of non-sustained vtach while on lido gtt.  Obtain EKG to re-eval QTc due to earlier concern.  Discussed case with cardiology on call, who has arrived to see the patient. EKG reviewed by cardiology, they will be taking over the care at this point.

## 2013-03-27 NOTE — Progress Notes (Signed)
Rhythm is much improved with lidocaine. There were some hypotension with isoproterenol so we have changed to dopamine. Heart rate is now 68. Blood pressures are in the mid 80s. Her potassium came back at 5.4. I have to confess I don't understand this at all. It would have anticipated 100 mEq deficit of potassium based on the outside level. I wonder whether with spurious now. However, we don't have an explanation for QT prolongation other than the diarrhea and electrolytes. The drugs that she has been taking in hospital IV Zofran may have made it worse. There were no other acute medication changes.  For now we will discontinue potassium supplementation

## 2013-03-27 NOTE — Progress Notes (Signed)
Chaplain responded to Code [employer]. Pt's husband and daughter had been taken to 2900 waiting area. I provided emotional support for them there. Code was over quickly. Pt recovered. I accompanied pt's husband and daughter back to pt's room.

## 2013-03-27 NOTE — Progress Notes (Signed)
Paged Dr Terressa Koyanagi about patient having very frequent multiple runs of V tach and to see if he wanted to start amio. Orders to continue to watch patient and do not start amio at this time were given by Dr. Terressa Koyanagi.

## 2013-03-27 NOTE — Progress Notes (Signed)
Paged Dr Terressa Koyanagi about patient's rhythm change and me and a few other nurses not being sure what rhythm she was in. Patient asymptomatic at this time, vitals stable. EKG obtained.

## 2013-03-28 ENCOUNTER — Encounter (HOSPITAL_COMMUNITY): Payer: Self-pay | Admitting: *Deleted

## 2013-03-28 DIAGNOSIS — R570 Cardiogenic shock: Secondary | ICD-10-CM

## 2013-03-28 LAB — BASIC METABOLIC PANEL
Calcium: 7.7 mg/dL — ABNORMAL LOW (ref 8.4–10.5)
Calcium: 7.4 mg/dL — ABNORMAL LOW (ref 8.4–10.5)
Chloride: 103 mEq/L (ref 96–112)
Creatinine, Ser: 1.34 mg/dL — ABNORMAL HIGH (ref 0.50–1.10)
GFR calc Af Amer: 46 mL/min — ABNORMAL LOW (ref >90)
GFR calc non Af Amer: 44 mL/min — ABNORMAL LOW (ref >90)
Sodium: 142 mEq/L (ref 135–145)

## 2013-03-28 NOTE — Progress Notes (Signed)
Patient Name: [name]      SUBJECTIVE: events of last pm noted with recurrent VT, pause dependent one  Self termintating and one needing a shock. These occurred temporally related to holding of lidocaine off few minutes ( could this be related)     K has been stable, Mg also yday levophed add to dopamine 2/2 low bp  Review of old ecgs all show QTc  prolongation dating back to 2010 ion the range of 470.  Pt has hx of EEG(--) Seizures  Cath 2010 >>non obstructive  Past Medical History  Diagnosis Date  . Pulmonary hypertension     s/p RHC 4/10 - PAP 86/29, wedge 21-23  . Gastric ulcer with hemorrhage   . Rheumatic fever     Childhood  . Coronary artery disease     Nonobstructive, LVEF 55%  . Mitral regurgitation     Moderate  . 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 Mitral stenosis with insufficiency, rheumatic   . Hypertension   . Heart murmur   . Stroke     12/11 [place],LEFT SIDE WEAKNESS  . Shortness of breath     EXERTION,PT. STATES SHE HAS PULMONARY HTN  . Pneumonia 1990'S     SEVERAL EPISODES  . Asthma     PT. STATES MILD,USES INHALERS PRN,  . Asthma     PT. ON OXYGEN 2 L/[place]   . Diabetes mellitus   . Hypothyroidism   . Anemia, pernicious 1979  . Hepatitis     PT. STATES SHE HAD IT WHEN SHE WAS 14 YRS. OLD  . Blood transfusion 2008  . Arthritis   . GERD (gastroesophageal reflux disease)   . Fibromyalgia   . Squamous cell carcinoma     Left arm  . Breast cancer     2008 s/p chemo and radiation (last tx 4/09) s/p left mastectomy s/p 9 lymph node resection - [name][facility]  . CHF (congestive heart failure) 2010    PHYSICAL EXAM Filed Vitals:   03/28/13 0400 03/28/13 0500 03/28/13 0600 03/28/13 0700  BP: 108/51 116/47 117/51 112/56  Pulse: 69 69 69 72  Temp:      TempSrc:      Resp: 20 19 19 17   Height:      Weight:  151 lb 3.8 oz (68.6 kg)    SpO2: 99% 99% 100% 100%    Well developed and nourished in no acute  distress HENT normal Neck supple with JVP-flat Clear Regular rate and rhythm, no murmurs or gallops Abd-soft with active BS No Clubbing cyanosis edema Skin-warm and dry A & Oriented  Grossly normal sensory and motor function  TELEMETRY: Reviewed telemetry pt in  As above:    Intake/Output Summary (Last 24 hours) at 03/28/13 0940 Last data filed at 03/28/13 0700  Gross per 24 hour  Intake 3623.57 ml  Output   1480 ml  Net 2143.57 ml    LABS: Basic Metabolic Panel:  Recent Labs Lab 03/26/13 1936 03/27/13 0715 03/27/13 1315 03/27/13 1808 03/28/13 0039 03/28/13 0511  NA 139 136 138 135 139 142  K 4.4 5.3* 3.7 4.0 3.7 3.5  CL 97 98 100 97 103 107  CO2 27 21 23 21 24 25   GLUCOSE 134* 142* 158* 199* 176* 156*  BUN 18 26* 27* 28* 24* 21  CREATININE 1.08 1.38* 1.51* 1.44* 1.34* 1.22*  CALCIUM 9.2 8.2* 7.8* 7.8* 7.7* 7.4*  MG 2.5 2.2  --  2.1  --   --  PHOS  --   --   --  2.5  --   --    Cardiac Enzymes:  Recent Labs  03/27/13 0127 03/27/13 0440 03/27/13 0718  TROPONINI 0.55* 0.40* <0.30   CBC:  Recent Labs Lab 03/26/13 1936  WBC 18.5*  HGB 12.4  HCT 39.1  MCV 87.7  PLT 275   BNP: BNP (last 3 results)  Recent Labs  03/26/13 1936  PROBNP 4733.0*     ASSESSMENT AND PLAN:  Active Problems:   Torsades de pointes   Cardiac arrest   Hypokalemia   Cardiogenic shock REviewed ECGs from the last few years  ALL are assoc with QT prolongation with QTC > 470 raising the question of LQTS, perhaps forme fruste, now unmaksed.  She has a " siezure disorder" with normal EEG raising question further of LQTS She will need outpt gene testing  Lidocaine will decrease to 1.5 mg/min   As i worry about neurotoxicity and cant get timely levels Wean levophed hopefully to off + tn is worrisome because another explanation for PMVT is ischemia, and the incredible response to lido would support that hypothesis BP problem may be aggravated by loss of pwave and will  hopefully recover one without the introduction of permanent pacemaker  Family hx reviewed  One infant death ? lyungs at 5 weeks Pt with hx of    Signed, Sherryl Manges MD  03/28/2013  .

## 2013-03-28 NOTE — Progress Notes (Signed)
Utilization review completed.  

## 2013-03-28 NOTE — Progress Notes (Signed)
Paged on call MD Nahser, Re low BP's.  Map 50-58, SBP in the 80's.  Discussed with him the current dopamine and possible need for norepinephrine.   See orders.    Suzzette Righter [facility]

## 2013-03-28 NOTE — Progress Notes (Signed)
.  CVP measured at 21 will decrease fluids  With TR -severe- not particularly useful

## 2013-03-29 ENCOUNTER — Inpatient Hospital Stay (HOSPITAL_COMMUNITY): Payer: Medicare Other

## 2013-03-29 DIAGNOSIS — I4901 Ventricular fibrillation: Secondary | ICD-10-CM

## 2013-03-29 DIAGNOSIS — I495 Sick sinus syndrome: Secondary | ICD-10-CM

## 2013-03-29 LAB — BASIC METABOLIC PANEL
BUN: 12 mg/dL (ref 6–23)
CO2: 23 mEq/L (ref 19–32)
Calcium: 7.1 mg/dL — ABNORMAL LOW (ref 8.4–10.5)
Chloride: 106 mEq/L (ref 96–112)
Creatinine, Ser: 0.92 mg/dL (ref 0.50–1.10)
GFR calc Af Amer: 72 mL/min — ABNORMAL LOW (ref >90)

## 2013-03-29 MED ORDER — POTASSIUM CHLORIDE CRYS ER 20 MEQ PO TBCR: 40.0000 meq | EXTENDED_RELEASE_TABLET | Freq: Two times a day (BID) | ORAL | Status: DC

## 2013-03-29 MED ORDER — SODIUM CHLORIDE 0.9 % IV SOLN: 250.0000 mL | INTRAVENOUS | Status: DC | PRN

## 2013-03-29 MED ORDER — SODIUM CHLORIDE 0.9 % IJ SOLN: 3.0000 mL | INTRAMUSCULAR | Status: DC | PRN

## 2013-03-29 MED ORDER — SODIUM CHLORIDE 0.9 % IJ SOLN
3.0000 mL | Freq: Two times a day (BID) | INTRAMUSCULAR | Status: DC
  Administered 2013-03-29 – 2013-03-30 (×3): 3 mL via INTRAVENOUS

## 2013-03-29 MED ORDER — POTASSIUM CHLORIDE 20 MEQ/15ML (10%) PO LIQD
40.0000 meq | Freq: Once | ORAL | Status: AC
  Filled 2013-03-29: qty 30

## 2013-03-29 MED ORDER — FUROSEMIDE 10 MG/ML IJ SOLN
INTRAMUSCULAR | Status: AC
  Filled 2013-03-29: qty 8

## 2013-03-29 MED ORDER — POTASSIUM CHLORIDE 20 MEQ/15ML (10%) PO LIQD
40.0000 meq | Freq: Two times a day (BID) | ORAL | Status: DC
  Administered 2013-03-29: 40 meq via ORAL
  Filled 2013-03-29 (×2): qty 30

## 2013-03-29 MED ORDER — ASPIRIN 81 MG PO CHEW
324.0000 mg | CHEWABLE_TABLET | ORAL | Status: AC
  Administered 2013-03-30: 324 mg via ORAL
  Filled 2013-03-29: qty 4

## 2013-03-29 MED ORDER — FUROSEMIDE 10 MG/ML IJ SOLN
40.0000 mg | Freq: Two times a day (BID) | INTRAMUSCULAR | Status: DC
  Administered 2013-03-30 – 2013-04-01 (×6): 40 mg via INTRAVENOUS
  Filled 2013-03-29 (×11): qty 4

## 2013-03-29 MED ORDER — FUROSEMIDE 10 MG/ML IJ SOLN
80.0000 mg | Freq: Once | INTRAMUSCULAR | Status: AC
  Administered 2013-03-29: 80 mg via INTRAVENOUS

## 2013-03-29 MED ORDER — POTASSIUM CHLORIDE 20 MEQ/15ML (10%) PO LIQD
ORAL | Status: AC
  Administered 2013-03-29: 40 meq via ORAL
  Filled 2013-03-29: qty 30

## 2013-03-29 MED ORDER — SODIUM CHLORIDE 0.9 % IV SOLN: INTRAVENOUS | Status: DC

## 2013-03-29 MED ORDER — POTASSIUM CHLORIDE 20 MEQ/15ML (10%) PO LIQD
40.0000 meq | Freq: Three times a day (TID) | ORAL | Status: DC
  Administered 2013-03-29: 40 meq via ORAL
  Filled 2013-03-29 (×4): qty 30

## 2013-03-29 NOTE — Progress Notes (Signed)
Patient ID: [name], female   DOB: [birth year], 72 y.o.   MRN: [id number] Subjective:  "I feel better"  Objective:  Vital Signs in the last 24 hours: Temp:  [98.2 F (36.8 C)-99.5 F (37.5 C)] 98.2 F (36.8 C) (05/19 0400) Pulse Rate:  [68-92] 81 (05/19 1400) Resp:  [18-30] 24 (05/19 1400) BP: (102-169)/(38-138) 160/84 mmHg (05/19 1400) SpO2:  [93 %-100 %] 97 % (05/19 1400) Weight:  [157 lb 13.6 oz (71.6 kg)-158 lb 11.7 oz (72 kg)] 158 lb 11.7 oz (72 kg) (05/19 1321)  Intake/Output from previous day: 05/18 0701 - 05/19 0700 In: 2953 [P.O.:480; I.V.:2473] Out: 1725 [Urine:1725] Intake/Output from this shift: Total I/O In: 604.4 [P.O.:120; I.V.:484.4] Out: 175 [Urine:175]  Physical Exam: Well appearing middle aged woman, NAD HEENT: Unremarkable Neck:  7 cm JVD, no thyromegally Back:  No CVA tenderness Lungs:  Clear with no wheezes, rales, or rhonchi HEART:  Regular rate rhythm, no murmurs, no rubs, no clicks Abd:  soft, positive bowel sounds, no organomegally, no rebound, no guarding Ext:  2 plus pulses, no edema, no cyanosis, no clubbing Skin:  No rashes no nodules Neuro:  CN II through XII intact, motor grossly intact  Lab Results:  Recent Labs  03/26/13 1936  WBC 18.5*  HGB 12.4  PLT 275    Recent Labs  03/28/13 0511 03/29/13 0430  NA 142 139  K 3.5 3.3*  CL 107 106  CO2 25 23  GLUCOSE 156* 142*  BUN 21 12  CREATININE 1.22* 0.92    Recent Labs  03/27/13 0440 03/27/13 0718  TROPONINI 0.40* <0.30   Hepatic Function Panel No results found for this basename: PROT, ALBUMIN, AST, ALT, ALKPHOS, BILITOT, BILIDIR, IBILI,  in the last 72 hours No results found for this basename: CHOL,  in the last 72 hours No results found for this basename: PROTIME,  in the last 72 hours  Imaging: Dg Chest Port 1 View  03/27/2013   *RADIOLOGY REPORT*  Clinical Data: Evaluate central venous catheter placement  PORTABLE CHEST - 1 VIEW  Comparison: 03/27/2013   Findings: There is a left IJ catheter with tip in the projection the SVC.  No pneumothorax identified.  Stable cardiac enlargement. Previous median sternotomy and CABG procedure.  There is atelectasis identified in both lung bases, left greater than right. Perihilar opacity within the right midlung appears similar to previous exam.  IMPRESSION:  1.  Left IJ catheter tip is in the projection of the SVC.  No pneumothorax identified. 2.  No change in aeration the lungs.   Original Report Authenticated By: Signa Kell, M.D.    Cardiac Studies: Tele - nsr Assessment/Plan:  1. VF 2. Long QT 3. Hypokalemia 4. S/p mitral and tricuspid valve surgery 5. Sinus bradycardia with junctional rhythm Rec: Discussed with Dr. Graciela Husbands. Will replete potassium, wean IV dopamine and if stable lidocaine, proceed with heart cath tomorrow and possible DDD ICD after that, likely on Wednesday.   LOS: 3 days    Merelyn Klump,M.D. 03/29/2013, 2:41 PM

## 2013-03-29 NOTE — Progress Notes (Signed)
   S: CTSP 2/2 tachypnea.  RN noted RR increasing into 30's.  Pt with mild dyspnea sitting upright.  O2 sats wnl.  Dopamine weaned off earlier and BP rising into the 150's.  Lido being weaned off.  Has been receiving IVF @ 50 ml/hr.  She has been off of her home diuretics since admission.  O:   Filed Vitals:   03/29/13 1700  BP: 152/71  Pulse: 95  Temp:   Resp: 32  Wt: 158 - up from 143 on admission.  She is net + 4.1 L for this admission.  Pleasant, NAD, AAOx3.  Neck supple, with JVD to jaw.  Lungs with diminished breath sound L base > R with crackles ~ 1/3 way up bilat.  Cor RRR, 2/6 syst and diast murmur llsb->apex.  Abd soft, nt/nd/bs+x4, EXT no CCE. _____________  CXR 5/19 Findings: Left sided pacing pad in place. Left IJ approach central venous catheter tip terminates over the proximal SVC. Evidence of CABG and occlusion device. Moderate enlargement of the cardiomediastinal silhouette is stable with central vascular congestion. Small pleural effusions are noted with obscuration of the retrocardiac space. Curvilinear bibasilar atelectasis again noted. _____________  A/P: 1.  Acute on chronic diastolic CHF:  Pt with tachypnea and evidence of volume overload with ~ 15 lb weight gain since admission in setting of being off of previous home diuretics.  D/C IVF (50/hr running), Lasix 80mg  IV x 1 now to be followed by 40mg  IV bid starting in AM.  Increase KCl supplementation to TID.  F/U bmet in AM.  Follow BP as it is rising off of dopamine.  Nicolasa Ducking, NP

## 2013-03-29 NOTE — Progress Notes (Signed)
Patient Name: [name]      SUBJECTIVE: events of last 03/27/2013 noted with recurrent VT, pause dependent, one self termintating and one needing a shock. These occurred temporally related to holding of lidocaine off few minutes   No acute events overnight    Past Medical History  Diagnosis Date  . Pulmonary hypertension     s/p RHC 4/10 - PAP 86/29, wedge 21-23  . Gastric ulcer with hemorrhage   . Rheumatic fever     Childhood  . Coronary artery disease     Nonobstructive, LVEF 55%  . Mitral regurgitation     Moderate  . 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 Mitral stenosis with insufficiency, rheumatic   . Hypertension   . Heart murmur   . Stroke     12/11 [place],LEFT SIDE WEAKNESS  . Shortness of breath     EXERTION,PT. STATES SHE HAS PULMONARY HTN  . Pneumonia 1990'S     SEVERAL EPISODES  . Asthma     PT. STATES MILD,USES INHALERS PRN,  . Asthma     PT. ON OXYGEN 2 L/[place]   . Diabetes mellitus   . Hypothyroidism   . Anemia, pernicious 1979  . Hepatitis     PT. STATES SHE HAD IT WHEN SHE WAS 14 YRS. OLD  . Blood transfusion 2008  . Arthritis   . GERD (gastroesophageal reflux disease)   . Fibromyalgia   . Squamous cell carcinoma     Left arm  . Breast cancer     2008 s/p chemo and radiation (last tx 4/09) s/p left mastectomy s/p 9 lymph node resection - [name], [facility]  . CHF (congestive heart failure) 2010    PHYSICAL EXAM Filed Vitals:   03/29/13 0400 03/29/13 0500 03/29/13 0600 03/29/13 0700  BP: 102/63 132/74 137/73 124/62  Pulse:      Temp: 98.2 F (36.8 C)     TempSrc: Oral     Resp: 30 22 23 27   Height:      Weight:  157 lb 13.6 oz (71.6 kg)    SpO2: 98%       General: Well-developed, well-nourished, elderly white female, lying in bed, in no acute distress; Head: Normocephalic, atraumatic. Neck: supple, no masses, no carotid Bruits, no JVD appreciated. Lungs: Normal respiratory effort. Clear to auscultation  bilaterally from apices to bases without crackles or wheezes appreciated. Heart: normal rate, regular rhythm, normal S1 and S2, +2/6 SEM LLSB Abdomen: BS normoactive. Soft, Nondistended, non-tender. No masses or organomegaly appreciated. Extremities: No pretibial edema, distal pulses intact Neurologic: grossly non-focal, alert and oriented x3, appropriate and cooperative throughout examination.   TELEMETRY: NSR with occasional paired PVCs    Intake/Output Summary (Last 24 hours) at 03/29/13 0743 Last data filed at 03/29/13 0700  Gross per 24 hour  Intake   2953 ml  Output   1650 ml  Net   1303 ml    LABS: Basic Metabolic Panel:  Recent Labs Lab 03/26/13 1936 03/27/13 0715 03/27/13 1315 03/27/13 1808 03/28/13 0039 03/28/13 0511 03/29/13 0430  NA 139 136 138 135 139 142 139  K 4.4 5.3* 3.7 4.0 3.7 3.5 3.3*  CL 97 98 100 97 103 107 106  CO2 27 21 23 21 24 25 23   GLUCOSE 134* 142* 158* 199* 176* 156* 142*  BUN 18 26* 27* 28* 24* 21 12  CREATININE 1.08 1.38* 1.51* 1.44* 1.34* 1.22* 0.92  CALCIUM 9.2 8.2* 7.8* 7.8* 7.7* 7.4*  7.1*  MG 2.5 2.2  --  2.1  --   --   --   PHOS  --   --   --  2.5  --   --   --    Cardiac Enzymes:  Recent Labs  03/27/13 0127 03/27/13 0440 03/27/13 0718  TROPONINI 0.55* 0.40* <0.30   CBC:  Recent Labs Lab 03/26/13 1936  WBC 18.5*  HGB 12.4  HCT 39.1  MCV 87.7  PLT 275   BNP: BNP (last 3 results)  Recent Labs  03/26/13 1936  PROBNP 4733.0*     ASSESSMENT AND PLAN: 72 y.o. 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  1. VF arrest    2. Hypokalemia, severe  --due to diarrhea and diuretics  3. Diarrhea  - ? Acute gastroenteritis  4. PAH with biventricular heart failure  5. H/o atrial fibrillation  6. Rheumatic valvular disease- MS/MR s/p bioprosthetic MVR, TR s/p TV repair with recurrent mod-severe TR  7. CKD  8. H/o CVA with R-sided weakness  9. CAD, minimal nonobstructive  10. H/o L breast CA s/p mastectomy/chemo/XRT 2008  11. Type 2 DM  12. HTN  13. Junctional rhythm - now NSR  14. Hypomagnesemia  Stable, EP to see today.  Cornelius Moras MD  03/29/2013  .

## 2013-03-30 ENCOUNTER — Encounter (HOSPITAL_COMMUNITY)
Admission: AD | Disposition: A | Discharge status: Home / Self Care | Payer: Self-pay | Source: Other Acute Inpatient Hospital | Attending: Internal Medicine

## 2013-03-30 ENCOUNTER — Inpatient Hospital Stay (HOSPITAL_COMMUNITY): Payer: Medicare Other

## 2013-03-30 DIAGNOSIS — I2510 Atherosclerotic heart disease of native coronary artery without angina pectoris: Secondary | ICD-10-CM

## 2013-03-30 DIAGNOSIS — R109 Unspecified abdominal pain: Secondary | ICD-10-CM

## 2013-03-30 DIAGNOSIS — R932 Abnormal findings on diagnostic imaging of liver and biliary tract: Secondary | ICD-10-CM

## 2013-03-30 DIAGNOSIS — R110 Nausea: Secondary | ICD-10-CM

## 2013-03-30 DIAGNOSIS — R197 Diarrhea, unspecified: Secondary | ICD-10-CM

## 2013-03-30 HISTORY — PX: LEFT AND RIGHT HEART CATHETERIZATION WITH CORONARY ANGIOGRAM: SHX5449

## 2013-03-30 LAB — URINALYSIS, ROUTINE W REFLEX MICROSCOPIC
Nitrite: NEGATIVE
Protein, ur: NEGATIVE mg/dL
Urobilinogen, UA: 0.2 mg/dL (ref 0.0–1.0)

## 2013-03-30 LAB — POCT I-STAT 3, VENOUS BLOOD GAS (G3P V)
Acid-base deficit: 6 mmol/L — ABNORMAL HIGH (ref 0.0–2.0)
O2 Saturation: 48 %
pO2, Ven: 27 mmHg — CL (ref 30.0–45.0)

## 2013-03-30 LAB — BASIC METABOLIC PANEL
BUN: 12 mg/dL (ref 6–23)
Calcium: 7.3 mg/dL — ABNORMAL LOW (ref 8.4–10.5)
GFR calc Af Amer: 70 mL/min — ABNORMAL LOW (ref >90)
GFR calc non Af Amer: 61 mL/min — ABNORMAL LOW (ref >90)
Glucose, Bld: 135 mg/dL — ABNORMAL HIGH (ref 70–99)

## 2013-03-30 LAB — CBC
HCT: 34.1 % — ABNORMAL LOW (ref 36.0–46.0)
Hemoglobin: 10.7 g/dL — ABNORMAL LOW (ref 12.0–15.0)
MCH: 27.3 pg (ref 26.0–34.0)
MCHC: 31.4 g/dL (ref 30.0–36.0)
RDW: 19.1 % — ABNORMAL HIGH (ref 11.5–15.5)

## 2013-03-30 LAB — POTASSIUM: Potassium: 4.4 mEq/L (ref 3.5–5.1)

## 2013-03-30 LAB — LIPASE, BLOOD: Lipase: 23 U/L (ref 11–59)

## 2013-03-30 LAB — GLUCOSE, CAPILLARY: Glucose-Capillary: 107 mg/dL — ABNORMAL HIGH (ref 70–99)

## 2013-03-30 LAB — POCT I-STAT 3, ART BLOOD GAS (G3+)
TCO2: 18 mmol/L (ref 0–100)
pH, Arterial: 7.409 (ref 7.350–7.450)

## 2013-03-30 LAB — MAGNESIUM: Magnesium: 1.8 mg/dL (ref 1.5–2.5)

## 2013-03-30 LAB — URINE MICROSCOPIC-ADD ON

## 2013-03-30 SURGERY — LEFT AND RIGHT HEART CATHETERIZATION WITH CORONARY ANGIOGRAM
Anesthesia: LOCAL

## 2013-03-30 MED ORDER — CHLORHEXIDINE GLUCONATE 4 % EX LIQD
60.0000 mL | Freq: Once | CUTANEOUS | Status: AC
  Administered 2013-03-30: 4 via TOPICAL

## 2013-03-30 MED ORDER — SODIUM CHLORIDE 0.9 % IR SOLN
80.0000 mg | Status: DC
  Filled 2013-03-30: qty 2

## 2013-03-30 MED ORDER — LORAZEPAM 2 MG/ML IJ SOLN
INTRAMUSCULAR | Status: AC
  Filled 2013-03-30: qty 1

## 2013-03-30 MED ORDER — MIDAZOLAM HCL 2 MG/2ML IJ SOLN
INTRAMUSCULAR | Status: AC
  Filled 2013-03-30: qty 2

## 2013-03-30 MED ORDER — CHLORHEXIDINE GLUCONATE 4 % EX LIQD
60.0000 mL | Freq: Once | CUTANEOUS | Status: AC
  Administered 2013-03-31: 4 via TOPICAL
  Filled 2013-03-30 (×2): qty 60

## 2013-03-30 MED ORDER — LORAZEPAM 2 MG/ML IJ SOLN
1.0000 mg | INTRAMUSCULAR | Status: AC | PRN
  Administered 2013-03-30 (×2): 1 mg via INTRAVENOUS
  Filled 2013-03-30 (×2): qty 1

## 2013-03-30 MED ORDER — ONDANSETRON HCL 4 MG/2ML IJ SOLN
4.0000 mg | Freq: Once | INTRAMUSCULAR | Status: AC
  Administered 2013-03-30: 4 mg via INTRAVENOUS

## 2013-03-30 MED ORDER — HEPARIN (PORCINE) IN NACL 2-0.9 UNIT/ML-% IJ SOLN
INTRAMUSCULAR | Status: AC
  Filled 2013-03-30: qty 1000

## 2013-03-30 MED ORDER — FENTANYL CITRATE 0.05 MG/ML IJ SOLN
INTRAMUSCULAR | Status: AC
  Filled 2013-03-30: qty 2

## 2013-03-30 MED ORDER — SODIUM CHLORIDE 0.9 % IV SOLN
INTRAVENOUS | Status: DC
  Administered 2013-03-31: 1000 mL via INTRAVENOUS
  Administered 2013-04-01: 04:00:00 via INTRAVENOUS

## 2013-03-30 MED ORDER — CEFAZOLIN SODIUM-DEXTROSE 2-3 GM-% IV SOLR
2.0000 g | INTRAVENOUS | Status: DC
  Filled 2013-03-30: qty 50

## 2013-03-30 MED ORDER — SODIUM CHLORIDE 0.9 % IV SOLN
1.0000 mL/kg/h | INTRAVENOUS | Status: AC
  Administered 2013-03-30: 1 mL/kg/h via INTRAVENOUS

## 2013-03-30 MED ORDER — SODIUM CHLORIDE 0.9 % IV SOLN: INTRAVENOUS | Status: DC

## 2013-03-30 MED ORDER — LORAZEPAM 2 MG/ML IJ SOLN
1.0000 mg | Freq: Once | INTRAMUSCULAR | Status: AC
  Administered 2013-03-30: 1 mg via INTRAVENOUS

## 2013-03-30 MED ORDER — ONDANSETRON HCL 4 MG/2ML IJ SOLN
INTRAMUSCULAR | Status: AC
  Filled 2013-03-30: qty 2

## 2013-03-30 MED ORDER — LIDOCAINE HCL (PF) 1 % IJ SOLN
INTRAMUSCULAR | Status: AC
  Filled 2013-03-30: qty 30

## 2013-03-30 NOTE — Interval H&P Note (Signed)
History and Physical Interval Note:  03/30/2013 3:17 PM  [name]  has presented today for surgery, with the diagnosis of Vfib arrest.  The various methods of treatment have been discussed with the patient and family. After consideration of risks, benefits and other options for treatment, the patient has consented to  Procedure(s): LEFT AND RIGHT HEART CATHETERIZATION WITH CORONARY ANGIOGRAM (N/A) as a surgical intervention .  The patient's history has been reviewed, patient examined, no change in status, stable for surgery.  I have reviewed the patient's chart and labs.  Questions were answered to the patient's satisfaction.     Theron Arista [facility] 03/30/2013 3:17 PM

## 2013-03-30 NOTE — Consult Note (Signed)
[facility] Consult: 9:15 AM 03/30/2013   Referring Provider: Sherryl Manges MD  Primary Care Physician:  Alton Revere, MD Primary Gastroenterologist:  None per family  Reason for Consultation:  Nausea and vomiting  HPI: [name] is a 72 y.o. female. 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  Has recurrent severe TR but too frail for repeat surgery.  Minimal CAD. CKD (Cr 1.3), CVA with transient R sided weakness 12/11.  Transferred from [place], [place] to [facility] 5/16 for VF arrest in the setting of hypokalemia (2.4).  Presenting complaint on intake that same day was "fainting", became quickly unresponsive and code called with chest compressions and defibrillation delivered.  Had hypokalemia at admission.  WBCs 13.8 at presentation, temp 98.1 On 5/17 code called for V tach, got one 120 Joule shock and returned to NSR.  Per notes the event occurred while lidocaine had been off for a few minutes,  potassium level was normal at 3. 7 to 4.0 .  Has prolonged QTc on old EKGs.  Weaned off Dopamine at 1600 5/17, Lidocaine drip stopped at 1800.  Heart rate holding steady in 80s.. Dr Graciela Husbands theorizing that anti nausea med Zofran may have contributed to prolonged QT.  Last night/early AM pt developed nausea.  Not relieved by Reglan and Compazine. There are plans afoot for left heart cardiac cath today.     Daughter says 1/2 stomach removed in 1979 for problems with obstructing PUD. Never had GI bleed. Has had intermittent stomach problems since and uses stomach meds occasionally.  In last year both she and her husband have had diarrhea on a frequent basis, sometimes weekly attributed by pt to a virus.  Pt has post  prandial SOB.   She takes once daily Protonix and takes Hydrocodone 7.5 mg 3 to 4 times per day for neck pain from fibromyalgia and from arthritis. Has only gotten Hydrocodone once per day while in hospital.  MDs added Reglan IV to regimen along with Compazine.    Continues to have small, watery, yellow stool  There are no LFTs assays, nor lipase, since admission. The LFTs were normal on 5/16 at [facility].    She had CT Angio of Ab/Pelvis in 08/2011.  It showed pneumobilia and diffuse intra and extra hepatic ductal dilatation. GB was not visualized.  The pt's daughter says pt never had GB surgery.    Past Medical History  Diagnosis Date  . Pulmonary hypertension     s/p RHC 4/10 - PAP 86/29, wedge 21-23  . Gastric ulcer with hemorrhage   . Rheumatic fever     Childhood  . Coronary artery disease     Nonobstructive, LVEF 55%  . Mitral regurgitation     Moderate  . 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 Mitral stenosis with insufficiency, rheumatic   . Hypertension   . Heart murmur   . Stroke     12/11 [place],LEFT SIDE WEAKNESS  . Shortness of breath     EXERTION,PT. STATES SHE HAS PULMONARY HTN  . Pneumonia 1990'S     SEVERAL EPISODES  . Asthma     PT. STATES MILD,USES INHALERS PRN,  . Asthma     PT. ON OXYGEN 2 L/Yetter   . Diabetes mellitus   . Hypothyroidism   . Anemia,  pernicious 1979  . Hepatitis     PT. STATES SHE HAD IT WHEN SHE WAS 14 YRS. OLD  . Blood transfusion 2008  . Arthritis   . GERD (gastroesophageal reflux disease)   . Fibromyalgia   . Squamous cell carcinoma     Left arm  . Breast cancer     2008 s/p chemo and radiation (last tx 4/09) s/p left mastectomy s/p 9 lymph node resection - [name], [facility]  . CHF (congestive heart failure) 2010    Past Surgical History  Procedure Laterality Date  . Left mastectomy    . Partial gastrectomy    . Tonsillectomy    . Cardiac catheterization  07/2011  . Mastectomy  2008    LEFT  . Maze   09/24/2011    Procedure: MAZE (complete biatrial lesion set using RF and cryo with clipping of LA appendage);  Surgeon: Purcell Nails, MD;  Location: [facility] OR;  Service: Open Heart Surgery;  Laterality: N/A;  . Mitral valve replacement  09/24/2011    Procedure: MITRAL VALVE (MV) REPLACEMENT (#69mm Medtronic Mosaic bioprosthetic tissue valve);  Surgeon: Purcell Nails, MD;  Location: [facility] OR;  Service: Open Heart Surgery;  Laterality: N/A;  . Tricuspid valvuloplasty  09/24/2011    #56mm Edwards mc3 ring annuloplasty    Prior to Admission medications   Medication Sig Start Date End Date Taking? Authorizing Provider  Acetaminophen (TYLENOL PO) Take 2 tablets by mouth every 12 (twelve) hours as needed (pain).   Yes Historical Provider, MD  hydrocodone-acetaminophen (LORCET PLUS) 7.5-650 MG per tablet Take 1 tablet by mouth every 6 (six) hours as needed for pain. 10/15/11  Yes Wayne E Gold, PA-C  letrozole (FEMARA) 2.5 MG tablet Take 2.5 mg by mouth daily. 10/10/11  Yes Wayne E Gold, PA-C  levothyroxine (SYNTHROID, LEVOTHROID) 137 MCG tablet Take 137 mcg by mouth daily.  10/10/11  Yes Wayne E Gold, PA-C  lisinopril (PRINIVIL,ZESTRIL) 5 MG tablet Take 2.5 mg by mouth daily.   Yes Historical Provider, MD  metFORMIN (GLUCOPHAGE) 1000 MG tablet Take 1,000 mg by mouth daily with supper.   Yes Historical Provider, MD  metolazone (ZAROXOLYN) 2.5 MG tablet Take 2.5 mg by mouth daily as needed (Hold if weight drops below 138lbs).   Yes Historical Provider, MD  metoprolol (LOPRESSOR) 50 MG tablet Take 1 tablet (50 mg total) by mouth 2 (two) times daily. 10/15/11  Yes Wayne E Gold, PA-C  pantoprazole (PROTONIX) 40 MG tablet Take 40 mg by mouth every evening. 10/10/11  Yes Wayne E Gold, PA-C  potassium chloride SA (K-DUR,KLOR-CON) 20 MEQ tablet Take 1 tablet (20 mEq total) by mouth daily. 01/27/13  Yes Hadassah Pais, PA-C  rosuvastatin (CRESTOR) 5 MG tablet Take 5 mg by mouth daily.   Yes Historical Provider, MD   torsemide (DEMADEX) 20 MG tablet Take 20-40 mg by mouth 2 (two) times daily. 2 tabs in the am (40 mg total); 1 tab in the pm (20 mg total)   Yes Historical Provider, MD  VITAMIN B1-B12 IJ Inject 1 mL into the skin every 30 (thirty) days.   Yes Historical Provider, MD  zolpidem (AMBIEN) 5 MG tablet Take 5 mg by mouth at bedtime.    Yes Historical Provider, MD    Scheduled Meds: . aspirin  324 mg Oral Pre-Cath  . enoxaparin (LOVENOX) injection  40 mg Subcutaneous Q24H  . furosemide  40 mg Intravenous BID  . letrozole  2.5 mg Oral Daily  . levothyroxine  137 mcg Oral QAC breakfast  . potassium chloride  40 mEq Oral TID  . rosuvastatin  5 mg Oral q1800  . sodium chloride  3 mL Intravenous Q12H  . sodium chloride  3 mL Intravenous Q12H  . zolpidem  5 mg Oral QHS   Infusions: . sodium chloride 10 mL/hr at 03/29/13 1900  . sodium chloride 20 mL/hr at 03/30/13 0600  . DOPamine Stopped (03/29/13 1600)  . isoproterenol (ISUPREL) infusion Stopped (03/27/13 1145)  . lidocaine Stopped (03/29/13 1800)  . norepinephrine (LEVOPHED) Adult infusion Stopped (03/28/13 1300)   PRN Meds: sodium chloride, sodium chloride, acetaminophen, ALPRAZolam, HYDROcodone-acetaminophen, LORazepam, metoCLOPramide (REGLAN) injection, prochlorperazine, sodium chloride, sodium chloride   Allergies as of 03/26/2013 - Review Complete 03/26/2013  Allergen Reaction Noted  . Codeine Nausea And Vomiting 09/24/2011  . Butorphanol tartrate    . Lipitor (atorvastatin calcium) Diarrhea 02/20/2011  . Revatio (sildenafil citrate) Swelling 02/20/2011    Family History  Problem Relation Age of Onset  . Heart disease Mother   . Colon cancer Mother     History   Social History  . Marital Status: Married    Spouse Name: N/A    Number of Children: 2  . Years of Education: N/A   Occupational History  . Unemployed     High school education   Social History Main Topics  . Smoking status: Never Smoker   . Smokeless  tobacco: Never Used  . Alcohol Use: No  . Drug Use: No  . Sexually Active: Not on file   Other Topics Concern  . Not on file   Social History Narrative  . No narrative on file    REVIEW OF SYSTEMS: Constitutional:  No weight loss at home.  Gained weight last week and took extra diuretics per pre planned protocol advised by cardiology ENT:  No nose bleeds Pulm:  Chronic exertional dyspnes CV:  No chest pain.  GU:  No blood in urine, no dysuria GI:  Per HPI.  No dysphagia Heme:  Chronic anemia.    Transfusions:  none Neuro:  No headaches.  No visual field loss Derm:  No itching or rash Endocrine:  No excessive thirst, no sweats Immunization:  Not queried.  Travel:  None beyond home in [place]   PHYSICAL EXAM: Vital signs in last 24 hours: Temp:  [98.1 F (36.7 C)-99.2 F (37.3 C)] 99.1 F (37.3 C) (05/20 0400) Pulse Rate:  [70-98] 70 (05/20 0700) Resp:  [19-32] 26 (05/20 0700) BP: (106-191)/(44-138) 106/44 mmHg (05/20 0700) SpO2:  [93 %-100 %] 98 % (05/20 0700) Weight:  [69.5 kg (153 lb 3.5 oz)-72 kg (158 lb 11.7 oz)] 69.5 kg (153 lb 3.5 oz) (05/20 0500)  General: ill looking, tired but responsive, WF Head:  No facial edema  Eyes:  No icterus or conj pallor Ears:  Unable to assess hearing  Nose:  No discharge Mouth:  Moist, clear oral MM.  Neck:  No mass or JVD Lungs:  Clear B.  Unlabored breathing. Decreased breath sounds at bases Heart: RRR, harsh systolic murmur Abdomen:  Soft, NT, ND, no mass, no HSM, no bruits. Prior surgical incisions well-healed. No hernia .   Rectal: deferred   Musc/Skeltl: no joint erythema or swelling.  Extremities:  Slight pitting edema in feet  Neurologic:  Able to arouse her sufficiently, she barely speaks, but does answer questions.  Skin:  No jaundice, no sores, no rash Tattoos:  none Nodes:  No inguinal or cervical adenopathy.  Psych:  Alert and oriented  Intake/Output from previous day: 05/19 0701 - 05/20  0700 In: 1486.1 [P.O.:620; I.V.:866.1] Out: 2625 [Urine:2625] Intake/Output this shift:    LAB RESULTS:  Recent Labs  03/30/13 0500  WBC 14.4*  HGB 10.7*  HCT 34.1*  PLT 250   BMET Lab Results  Component Value Date   NA 139 03/30/2013   NA 139 03/29/2013   NA 142 03/28/2013   K 4.5 03/30/2013   K 3.3* 03/29/2013   K 3.5 03/28/2013   CL 107 03/30/2013   CL 106 03/29/2013   CL 107 03/28/2013   CO2 17* 03/30/2013   CO2 23 03/29/2013   CO2 25 03/28/2013   GLUCOSE 135* 03/30/2013   GLUCOSE 142* 03/29/2013   GLUCOSE 156* 03/28/2013   BUN 12 03/30/2013   BUN 12 03/29/2013   BUN 21 03/28/2013   CREATININE 0.94 03/30/2013   CREATININE 0.92 03/29/2013   CREATININE 1.22* 03/28/2013   CALCIUM 7.3* 03/30/2013   CALCIUM 7.1* 03/29/2013   CALCIUM 7.4* 03/28/2013   LFT No results found for this basename: PROT, ALBUMIN, AST, ALT, ALKPHOS, BILITOT, BILIDIR, IBILI,  in the last 72 hours PT/INR Lab Results  Component Value Date   INR 1.14 08/05/2012   INR 1.4* 10/25/2011   INR 1.9 10/17/2011    C-Diff No components found with this basename: cdiff   RADIOLOGY STUDIES: Dg Chest Port 1 View 03/29/2013   *RADIOLOGY REPORT*  Clinical Data: Cardiac arrest  PORTABLE CHEST - 1 VIEW  Comparison: 03/27/2013  Findings: Left sided pacing pad in place.  Left IJ approach central venous catheter tip terminates over the proximal SVC.  Evidence of CABG and occlusion device.  Moderate enlargement of the cardiomediastinal silhouette is stable with central vascular congestion.  Small pleural effusions are noted with obscuration of the retrocardiac space.  Curvilinear bibasilar atelectasis again noted.  IMPRESSION: No significant change as above.   Original Report Authenticated By: Christiana Pellant, M.D.   08/2011  CTA ABDOMEN AND PELVIS  Findings: Abdominal aorta demonstrates mild atherosclerosis of the  wall with calcification extending to the bifurcation. Negative for  aneurysm, dissection, occlusive process, or  retroperitoneal  hemorrhage. Celiac, SMA, single renal arteries, and IMA appear  patent. No significant mesenteric or renal vascular occlusive  process.  Aortic bifurcation, common, internal, and external iliac arteries  are all patent. No iliac occlusive process, aneurysm or  dissection. Common femoral, proximal profunda femoral, and  proximal superficial femoral arteries visualized are all patent.  Additional axial imaging: Postop changes of the stomach. The  gallbladder is either surgically absent or completely collapsed.  There is pneumobilia in the left hepatic lobe diffusely. Slight  dilatation of the proximal common bile duct measuring 12 mm, image  106. The distal CBD is also dilated measuring 13 mm, image 116.  Diffuse biliary prominence throughout the liver. No focal hepatic  abnormality identified. Portal vein is patent. Pancreas  demonstrates diffuse fatty replacement. Heterogeneous early  arterial phase enhancement of the spleen without a definite  abnormality. Adrenal glands are normal. Bilateral cortical  thinning/atrophy of both kidneys, more pronounced involving the  left kidney. Left lower pole renal cyst noted measuring 3.7 cm,  image 128.  Negative for bowel obstruction, dilatation, ileus, or free air. No  abdominal free fluid, fluid collection, hemorrhage, adenopathy, or  hernia.  Distal colon is stool-filled. Urinary bladder is unremarkable.  Dystrophic calcifications of the small uterus and also involving  the left ovary. No definite adnexal lesion  or mass. No pelvic  free fluid, fluid collection, hemorrhage, adenopathy, inguinal  abnormality or hernia. Degenerative changes of the spine. No  acute or abnormal osseous finding.  Review of the MIP images confirms the above findings.  IMPRESSION:  Negative for significant aortoiliac occlusive process. No evidence  of abdominal aortic aneurysm or dissection.  Postop changes of the stomach.  Mild diffuse biliary  prominence and left hepatic lobe pneumobilia.  This may be chronic, related to prior GI surgery, or related to  prior ERCP instrumentation.  Fatty replacement of the pancreas.  Renal cortical atrophy bilaterally, worse on the left.  3.7 cm left renal lower pole cyst.   ENDOSCOPIC STUDIES: Daughter says no EGD in past 5 years and no colonoscopy ever, no ERCP.  IMPRESSION: *  V fib arrest.  Coded with debrilalation 2023/04/04 and 5/17.  CPR/chest compressions 04-04-23.  *  Chronic nausea, worse now. MultifactoriaL:  Narcotics, post gastrectomy status, recent codes x 2, diabetes and possible diabetic gastroparesis. .  *  S/p partial gastrectomy 1979 for obstructing, not bleeding, PUD.  Takes daily PPI.  *  Chronic neck pain treated with chronic Lortab.  This almost certainly contributing to nausea problems.  However currently she is getting just one Lortab daily, norm is 3 to 4, so she may actually be experiencing withdrawal related nausea.  *  Abnormal biliary tree, pneumobilia, and non-vis'd GB on 2012 CTA.  LFTs normal at arrival to [facility] on 2023/04/04.  *  s/p bioprosthetic MVR, TV repair + Maze 09/2011.  Recurrent TR, pulmonary hypertension, but too frail for surgery.   PLAN: *  Abdominal ultrasound in AM:  Non-invasive, low risk way to reassess biliary tree, ascertain if GB still not visible.  *  Check LFTs and Lipase, though the LFTs may very well have elevated after recent resuscitation efforts.  *  TSH, U/A, Hgb A1C, C diff PCR *  Consider gastric emptying study, not now however.  *  Consider EGD *  Should we up the Lortab to 3 x daily *   Left heart cath today.  DDD and ICD planned for ? 5/21.     LOS: 4 days   Jennye Moccasin  03/30/2013, 9:15 AM Pager: [phone]  GI ATTENDING  History, laboratories, x-rays personally reviewed. Patient personally reviewed and examined. The patient's daughter and husband were present. Agree with history and physical as outlined. Pertinent parts  of my examination in bold. Complicated 72 year old with multiple medical problems as listed below. Asked to see regarding nausea. The patient states that she has only had nausea since being in the hospital. Her husband and daughter confirm this history is accurate. No vomiting. Some complaints of abdominal discomfort. Intermittent problems with loose stools, more chronically. The patient did not take Zofran before her first V. fib arrest, but did prior to the second. She has altered anatomy secondary ulcer disease. CT of the abdomen 2012 raises the question of biliary surgery. No gallbladder visualized and pneumobilia present. A number of possibilities exist for the etiology of her nausea including acute illness, chronic illness, polypharmacy, or primary GI disorders such as poor gastric emptying, gastric bezoar, Candida esophagitis, and GERD, ulcer disease, or biliary process. Liver tests on admission were unremarkable. No succussion splash on physical exam. Cardiology plans include catheterization today and ICD placement tomorrow. We will start with noninvasive evaluation including laboratories and ultrasound. Also, add PPI. She may benefit from diagnostic upper endoscopy after interventional cardiology procedures completed. I discussed this with  the patient and her family. GI will continue to follow closely. Thank you  Wilhemina Bonito. Eda Keys., M.D. [facility] Division of Gastroenterology

## 2013-03-30 NOTE — Progress Notes (Signed)
    Subjective:  Overnight, the patient noted nausea, unrelieved by reglan and compazine, though relieved by ativan, with resulting somnolence this morning.  The patient's HR remained in the 80's overnight.  Dopamine was stopped at 1600 yesterday, lidocaine stopped at 1800, with no recurrence of VT overnight.  Objective:  Vital Signs in the last 24 hours: Temp:  [98.1 F (36.7 C)-99.2 F (37.3 C)] 99.1 F (37.3 C) (05/20 0400) Pulse Rate:  [70-98] 70 (05/20 0700) Resp:  [19-32] 26 (05/20 0700) BP: (106-191)/(44-138) 106/44 mmHg (05/20 0700) SpO2:  [93 %-100 %] 98 % (05/20 0700) Weight:  [153 lb 3.5 oz (69.5 kg)-158 lb 11.7 oz (72 kg)] 153 lb 3.5 oz (69.5 kg) (05/20 0500)  Intake/Output from previous day: 05/19 0701 - 05/20 0700 In: 1486.1 [P.O.:620; I.V.:866.1] Out: 2625 [Urine:2625]  Physical Exam: General: lying in bed, somnolent but arousable HEENT: pupils equal round and reactive to light, oropharynx clear and non-erythematous  Neck: supple, no lymphadenopathy, no JVD Lungs: clear to ascultation bilaterally, normal work of respiration, no wheezes, rales, ronchi Heart: regular rate and rhythm, no murmurs, gallops, or rubs Abdomen: soft, non-tender, non-distended, normal bowel sounds Extremities: 1+ pitting presacral edema Neurologic: somnolent s/p ativan, but arousable, answers questions appropriately  Lab Results:  Recent Labs  03/30/13 0500  WBC 14.4*  HGB 10.7*  PLT 250    Recent Labs  03/29/13 0430 03/30/13 0500  NA 139 139  K 3.3* 4.5  CL 106 107  CO2 23 17*  GLUCOSE 142* 135*  BUN 12 12  CREATININE 0.92 0.94   No results found for this basename: TROPONINI, CK, MB,  in the last 72 hours  Cardiac Studies: Echocardiogram 03/27/13 - Left ventricle: The cavity size was normal. Wall thickness was normal. Systolic function was normal. The estimated ejection fraction was in the range of 55% to 60%. Wall motion was normal; there were no regional wall  motion abnormalities. - Ventricular septum: Septal motion showed abnormal function and dyssynergy. - Aortic valve: Trivial regurgitation. - Mitral valve: A bioprosthesis was present. Valve area by pressure half-time: 2.32cm^2. - Left atrium: The atrium was mildly dilated. - Right atrium: The atrium was mildly dilated. - Tricuspid valve: Prior procedures included surgical repair. Moderate-severe regurgitation. - Pulmonary arteries: Systolic pressure was mildly increased. PA peak pressure: 46mm Hg (S).  Tele: NSR, HR in 80's  Assessment/Plan:  The patient is a 72 yo woman, history of bioprosthetic MVR, tricuspid valve repair, pulm HTN, non-obstructive CAD (by cath 07/2011), presenting with vfib arrest.  # VF arrest/Long QT - the patient presented with vfib arrest, later with episodes of VT during her hospital course.  The etiology is thought to be related to hypokalemia and the patient's long QT (?congenital vs contributed to by zofran).  The patient's dopamine and lidocaine were discontinued yesterday at 1600 and 1800 respectively. -plan for L heart cath today -possible DDD ICD pending cath results, as early as Wednesday  # Hypokalemia - K this morning was 4.5, though specimen hemolyzed -recheck K and Mg, replete as indicated  # CHF - the patient's most recent EF is 55%, though echo from 1 month ago shows an EF of 35-40% at that time.  The patient appears euvolemic, with only minimal presacral edema.  S/p lasix yesterday.  # s/p bioprosthetic MVR, s/p TV repair # Sinus Bradycardia with Junctional Rhythm  Janalyn Harder, M.D. 03/30/2013, 7:29 AM

## 2013-03-30 NOTE — CV Procedure (Signed)
   Cardiac Catheterization Procedure Note  Name: [name] MRN: [id number] DOB: 04-Feb-1941  Procedure: Right Heart Cath, Left Heart Cath, Selective Coronary Angiography, LV angiography  Indication: 72 yo WF s/p MVR with bioprosthetic valve and TV annuloplasty. Presents with Vfib arrest. She has LV dysfunction with prior EF of 35-40%.   Procedural Details: The right groin was prepped, draped, and anesthetized with 1% lidocaine. Using the modified Seldinger technique a 5 French sheath was placed in the right femoral artery and a 7 French sheath was placed in the right femoral vein. A Swan-Ganz catheter was used for the right heart catheterization. Standard protocol was followed for recording of right heart pressures and sampling of oxygen saturations. Fick cardiac output was calculated. Standard Judkins catheters were used for selective coronary angiography and left ventriculography. There were no immediate procedural complications. The patient was transferred to the post catheterization recovery area for further monitoring.  Procedural Findings: Hemodynamics RA 14/19 with a mean of 13 mm Hg RV 56/6 mm Hg PA 61/21 mean of 35 mm Hg PCWP 20/18 mean of 17 mm Hg. LV 107/10 mm Hg AO 109/59 mean of 77 mm Hg  Mean MV gradient of 12 mm Hg with MV area of .85 cm2  No significant AV gradient.  Oxygen saturations: PA 48% AO 97%  Cardiac Output (Fick) 3.19 L/min  Cardiac Index (Fick) 1.87 L/min/m2   Coronary angiography: Coronary dominance: left  Left mainstem: Normal  Left anterior descending (LAD): 20-30% at takeoff of first diagonal  Left circumflex (LCx): 40% in mid vessel after OM2, large dominant vessel.  Right coronary artery (RCA): nondominant. normal  Left ventriculography: Left ventricular systolic function is abnormal, LVEF is estimated at 30-35%, There is severe hypokinesis of the mid to distal anterior and inferior walls and apex, there is no significant mitral  regurgitation   Final Conclusions:   1. Nonobstructive CAD with left dominant circulation 2. Severe LV dysfunction 3. Moderate mitral stenosis. (MV prosthesis) 4. Moderate pulmonary HTN.  Recommendations: Proceed with ICD placement. Cath data is at odds with Echo data. Will need to review.    Theron Arista [facility] 03/30/2013, 3:45 PM

## 2013-03-30 NOTE — Progress Notes (Addendum)
Pt wi complaints of nausea and ongoing but hopefully resolving diarrhea  Belly quiet  Rthyhm Sinus !! With recovered p waves One of our hypotheses is that in hte setting of forme fruste LQTS aggravated by QT prolongation in part 2/2 anti nausea meds Hence we need to find cause and treat it Will check 12 lead CAth today DDD-ICD in am GI consult for nausea  Will plan outpatient genetic testing

## 2013-03-31 ENCOUNTER — Encounter (HOSPITAL_COMMUNITY)
Admission: AD | Disposition: A | Discharge status: Home / Self Care | Payer: Self-pay | Source: Other Acute Inpatient Hospital | Attending: Internal Medicine

## 2013-03-31 DIAGNOSIS — I4901 Ventricular fibrillation: Secondary | ICD-10-CM

## 2013-03-31 HISTORY — PX: IMPLANTABLE CARDIOVERTER DEFIBRILLATOR IMPLANT: SHX5473

## 2013-03-31 LAB — COMPREHENSIVE METABOLIC PANEL
ALT: 8 U/L (ref 0–35)
BUN: 14 mg/dL (ref 6–23)
Calcium: 7.2 mg/dL — ABNORMAL LOW (ref 8.4–10.5)
GFR calc Af Amer: 69 mL/min — ABNORMAL LOW (ref >90)
Glucose, Bld: 143 mg/dL — ABNORMAL HIGH (ref 70–99)
Sodium: 144 mEq/L (ref 135–145)
Total Protein: 6.5 g/dL (ref 6.0–8.3)

## 2013-03-31 LAB — CBC
HCT: 33.5 % — ABNORMAL LOW (ref 36.0–46.0)
Hemoglobin: 10.6 g/dL — ABNORMAL LOW (ref 12.0–15.0)
RBC: 3.8 MIL/uL — ABNORMAL LOW (ref 3.87–5.11)
WBC: 12.9 10*3/uL — ABNORMAL HIGH (ref 4.0–10.5)

## 2013-03-31 LAB — CLOSTRIDIUM DIFFICILE BY PCR: Toxigenic C. Difficile by PCR: NEGATIVE

## 2013-03-31 SURGERY — IMPLANTABLE CARDIOVERTER DEFIBRILLATOR IMPLANT
Anesthesia: LOCAL

## 2013-03-31 MED ORDER — SIMETHICONE 80 MG PO CHEW
160.0000 mg | CHEWABLE_TABLET | Freq: Four times a day (QID) | ORAL | Status: DC | PRN
  Administered 2013-03-31 – 2013-04-03 (×4): 160 mg via ORAL
  Filled 2013-03-31 (×7): qty 2

## 2013-03-31 MED ORDER — LORAZEPAM 2 MG/ML IJ SOLN
INTRAMUSCULAR | Status: AC
  Filled 2013-03-31: qty 1

## 2013-03-31 MED ORDER — CEFAZOLIN SODIUM-DEXTROSE 2-3 GM-% IV SOLR
2.0000 g | Freq: Four times a day (QID) | INTRAVENOUS | Status: AC
  Administered 2013-03-31 – 2013-04-01 (×3): 2 g via INTRAVENOUS
  Filled 2013-03-31 (×3): qty 50

## 2013-03-31 MED ORDER — FENTANYL CITRATE 0.05 MG/ML IJ SOLN
INTRAMUSCULAR | Status: AC
  Filled 2013-03-31: qty 2

## 2013-03-31 MED ORDER — METOPROLOL TARTRATE 1 MG/ML IV SOLN
INTRAVENOUS | Status: AC
  Filled 2013-03-31: qty 5

## 2013-03-31 MED ORDER — ONDANSETRON HCL 4 MG/2ML IJ SOLN
4.0000 mg | Freq: Three times a day (TID) | INTRAMUSCULAR | Status: DC | PRN
  Administered 2013-03-31: 2 mg via INTRAVENOUS

## 2013-03-31 MED ORDER — SODIUM CHLORIDE 0.9 % IR SOLN
80.0000 mg | Status: AC
  Filled 2013-03-31: qty 2

## 2013-03-31 MED ORDER — MIDAZOLAM HCL 5 MG/5ML IJ SOLN
INTRAMUSCULAR | Status: AC
  Filled 2013-03-31: qty 5

## 2013-03-31 MED ORDER — PANTOPRAZOLE SODIUM 40 MG PO TBEC
40.0000 mg | DELAYED_RELEASE_TABLET | Freq: Every day | ORAL | Status: DC
  Administered 2013-03-31 – 2013-04-04 (×5): 40 mg via ORAL
  Filled 2013-03-31 (×5): qty 1

## 2013-03-31 MED ORDER — ACETAMINOPHEN 325 MG PO TABS: 325.0000 mg | ORAL_TABLET | ORAL | Status: DC | PRN

## 2013-03-31 MED ORDER — ONDANSETRON HCL 4 MG/2ML IJ SOLN
4.0000 mg | Freq: Four times a day (QID) | INTRAMUSCULAR | Status: DC | PRN
  Administered 2013-03-31 – 2013-04-01 (×2): 4 mg via INTRAVENOUS
  Filled 2013-03-31 (×2): qty 2

## 2013-03-31 MED ORDER — ONDANSETRON HCL 4 MG/2ML IJ SOLN
INTRAMUSCULAR | Status: AC
  Filled 2013-03-31: qty 2

## 2013-03-31 MED ORDER — LORAZEPAM 2 MG/ML IJ SOLN
2.0000 mg | Freq: Once | INTRAMUSCULAR | Status: AC
  Administered 2013-03-31: 2 mg via INTRAVENOUS

## 2013-03-31 MED ORDER — CEFAZOLIN SODIUM-DEXTROSE 2-3 GM-% IV SOLR
2.0000 g | INTRAVENOUS | Status: AC
  Filled 2013-03-31: qty 50

## 2013-03-31 MED ORDER — LIDOCAINE HCL (PF) 1 % IJ SOLN
INTRAMUSCULAR | Status: AC
  Filled 2013-03-31: qty 60

## 2013-03-31 MED ORDER — HEPARIN (PORCINE) IN NACL 2-0.9 UNIT/ML-% IJ SOLN
INTRAMUSCULAR | Status: AC
  Filled 2013-03-31: qty 500

## 2013-03-31 NOTE — Op Note (Signed)
Very difficult but successful DDD ICD implant via the right subclavian vein without immediate complication. [id number].

## 2013-03-31 NOTE — Progress Notes (Signed)
Orthopedic Tech Progress Note Patient Details:  [name] 1941-09-10 [id number]  Ortho Devices Type of Ortho Device: Arm sling Ortho Device/Splint Location: right arm Ortho Device/Splint Interventions: Application   [name] 03/31/2013, 1:32 PM

## 2013-03-31 NOTE — Progress Notes (Signed)
Subjective:  The patient continued to note nausea overnight, as well as diarrhea.  Work-up of nausea yesterday included a normal lipase level, and US showing an absent gallbladder but otherwise normal.  The patient underwent cardiac cath yesterday, which showed non-obstructive CAD, though an EF of 30-35% (compared to the EF of 55-60% observed on echo 3 days prior).  EKG this morning continues to show long QTc.  Objective:  Vital Signs in the last 24 hours: Temp:  [98.1 F (36.7 C)-99 F (37.2 C)] 98.1 F (36.7 C) (05/21 0400) Pulse Rate:  [68-128] 86 (05/21 0600) Resp:  [14-37] 22 (05/21 0600) BP: (112-152)/(45-80) 141/66 mmHg (05/21 0600) SpO2:  [95 %-100 %] 100 % (05/21 0600) Weight:  [151 lb 7.3 oz (68.7 kg)] 151 lb 7.3 oz (68.7 kg) (05/21 0430)  Intake/Output from previous day: 05/20 0701 - 05/21 0700 In: 488 [I.V.:488] Out: 2000 [Urine:2000]  Physical Exam: General: lying in bed, NAD HEENT: pupils equal round and reactive to light, oropharynx clear and non-erythematous  Neck: supple, no lymphadenopathy, no JVD Lungs: clear to ascultation bilaterally, normal work of respiration, no wheezes, rales, ronchi Heart: regular rate and rhythm, grade III/VI systolic murmur Abdomen: soft, non-tender, non-distended, normal bowel sounds Extremities: non-pitting presacral edema Neurologic: A&O x3, CN II-XII grossly intact, strength and sensation intact  Lab Results:  Recent Labs  03/30/13 0500  WBC 14.4*  HGB 10.7*  PLT 250    Recent Labs  03/29/13 0430 03/30/13 0500 03/30/13 1000  NA 139 139  --   K 3.3* 4.5 4.4  CL 106 107  --   CO2 23 17*  --   GLUCOSE 142* 135*  --   BUN 12 12  --   CREATININE 0.92 0.94  --    No results found for this basename: TROPONINI, CK, MB,  in the last 72 hours  Cardiac Studies: Echocardiogram 03/27/13 - Left ventricle: The cavity size was normal. Wall thickness was normal. Systolic function was normal. The estimated ejection  fraction was in the range of 55% to 60%. Wall motion was normal; there were no regional wall motion abnormalities. - Ventricular septum: Septal motion showed abnormal function and dyssynergy. - Aortic valve: Trivial regurgitation. - Mitral valve: A bioprosthesis was present. Valve area by pressure half-time: 2.32cm^2. - Left atrium: The atrium was mildly dilated. - Right atrium: The atrium was mildly dilated. - Tricuspid valve: Prior procedures included surgical repair. Moderate-severe regurgitation. - Pulmonary arteries: Systolic pressure was mildly increased. PA peak pressure: 46mm Hg (S).  Cardiac Catheterization 03/30/13 Hemodynamics  RA 14/19 with a mean of 13 mm Hg  RV 56/6 mm Hg  PA 61/21 mean of 35 mm Hg  PCWP 20/18 mean of 17 mm Hg.  LV 107/10 mm Hg  AO 109/59 mean of 77 mm Hg  Mean MV gradient of 12 mm Hg with MV area of .85 cm2  No significant AV gradient.  Oxygen saturations:  PA 48%  AO 97%  Cardiac Output (Fick) 3.19 L/min  Cardiac Index (Fick) 1.87 L/min/m2  Coronary angiography:  Coronary dominance: left  Left mainstem: Normal  Left anterior descending (LAD): 20-30% at takeoff of first diagonal  Left circumflex (LCx): 40% in mid vessel after OM2, large dominant vessel.  Right coronary artery (RCA): nondominant. normal  Left ventriculography: Left ventricular systolic function is abnormal, LVEF is estimated at 30-35%, There is severe hypokinesis of the mid to distal anterior and inferior walls and apex, there is no significant mitral regurgitation  Final Conclusions:  1. Nonobstructive CAD with left dominant circulation  2. Severe LV dysfunction  3. Moderate mitral stenosis. (MV prosthesis)  4. Moderate pulmonary HTN.  Recommendations: Proceed with ICD placement. Cath data is at odds with Echo data. Will need to review.   Tele: NSR, few PVC's  Assessment/Plan:  The patient is a 71 yo woman, history of bioprosthetic MVR, tricuspid valve repair, pulm HTN,  non-obstructive CAD (by cath 03/30/13), presenting with vfib arrest.  # VF arrest/Long QT - the patient presented with vfib arrest, later with episodes of VT during her hospital course.  The etiology is thought to be related to hypokalemia and the patient's long QT (?congenital vs contributed to by zofran).  The patient's dopamine and lidocaine were discontinued 5/19.  Cardiac cath 5/20 shows non-obstructive CAD.  EKG 5/21 shows persistent long QTc. -plan for DDD-ICD today  # Hypokalemia - the patient has had significant hypokalemia during this hospitalization. -daily BMET's, replete as needed  # CHF - echocardiogram 5/17 showed an EF of 55%, though cardiac cath 5/20 showed an EF of 30-35%.  Review of prior echos over the last 2 years reveals wide variation in EF from 30-55%.  The patient appears euvolemic, with only minimal presacral edema.   -continue lasix 40 IV BID  # Nausea  -GI consulted, appreciate recs -continue reglan, compazine -AVOID ZOFRAN as this can prolong QT  # Diarrhea - c diff pending # s/p bioprosthetic MVR, s/p TV repair # Sinus Bradycardia with Junctional Rhythm  Janalyn Harder, M.D. 03/31/2013, 7:11 AM  EP Patient seen and examined. Agree with above.  Will plan to proceed with ICD today. I have discussed the procedure with the patient and the risks/benefits/goals/expectations of the procedure have been discussed with the patient and she wishes to proceed.  Leonia Reeves.D.

## 2013-03-31 NOTE — Progress Notes (Signed)
[facility] Daily Rounding Note 03/31/2013, 8:27 AM  SUBJECTIVE:       Nausea and loose stools continue.  Pt able to tell me this had been a new issue for about 10 days.  Stools are not bloody.  Pt is regurgitating clear, sputum like material, not truly wretching/vomiting.  C Diff is negative.  Pt not aware of ever having GB removed, so ? If done at time of the gastrectomy.   OBJECTIVE:         Vital signs in last 24 hours:    Temp:  [98.1 F (36.7 C)-99 F (37.2 C)] 98.2 F (36.8 C) (05/21 0800) Pulse Rate:  [68-128] 96 (05/21 0800) Resp:  [14-37] 19 (05/21 0800) BP: (112-152)/(45-97) 146/97 mmHg (05/21 0800) SpO2:  [95 %-100 %] 100 % (05/21 0800) Weight:  [68.7 kg (151 lb 7.3 oz)] 68.7 kg (151 lb 7.3 oz) (05/21 0430) Last BM Date: 03/25/13 General: More alert this AM.  Looks pale and chronically ill.    Heart: RRR Chest: clear B, no cough, slight dyspnea with speech. Abdomen: soft, not tender or distended.  Extremities: no pedal edema, no cyanosis. Neuro/Psych:  Pleasant, appropriate,  Affect subdued.   Intake/Output from previous day: 05/20 0701 - 05/21 0700 In: 488 [I.V.:488] Out: 2000 [Urine:2000]  Intake/Output this shift: Total I/O In: 100 [I.V.:100] Out: 100 [Urine:100]  Lab Results:  Recent Labs  03/30/13 0500 03/31/13 0738  WBC 14.4* 12.9*  HGB 10.7* 10.6*  HCT 34.1* 33.5*  PLT 250 235   BMET  Recent Labs  03/29/13 0430 03/30/13 0500 03/30/13 1000 03/31/13 0738  NA 139 139  --  144  K 3.3* 4.5 4.4 3.9  CL 106 107  --  110  CO2 23 17*  --  17*  GLUCOSE 142* 135*  --  143*  BUN 12 12  --  14  CREATININE 0.92 0.94  --  0.95  CALCIUM 7.1* 7.3*  --  7.2*   LFT  Recent Labs  03/31/13 0738  PROT 6.5  ALBUMIN 3.0*  AST 14  ALT 8  ALKPHOS 81  BILITOT 0.5   TSH   3.21  Studies/Results: US Abdomen Complete 03/31/2013     Findings:  Gallbladder:  Not visualized, presumably due to prior cholecystectomy.  Common Bile Duct:  Within normal  limits in caliber. Measures 6-7 mm in diameter.  Liver: No focal mass lesion identified.  Within normal limits in parenchymal echogenicity.  IVC:  Appears normal.  Pancreas:  Suboptimal visualization due to overlying bowel gas.  Spleen:  Within normal limits in size and echotexture.  Right kidney:  Normal in size and parenchymal echogenicity.  No evidence of mass or hydronephrosis.  Left kidney:  Normal in size and parenchymal echogenicity. Parenchymal thinning, consistent with atrophy.  No evidence of mass or hydronephrosis.  3.8 cm cyst noted in the lower pole.  Abdominal Aorta:  No aneurysm identified.  IMPRESSION:  1.  Nonvisualization of gallbladder, presumably due to prior cholecystectomy.  No evidence of biliary dilatation. 2.  Left renal parenchymal atrophy and cyst. No evidence of hydronephrosis.   Original Report Authenticated By: Myles Rosenthal, M.D.     ASSESMENT: * V fib arrest. Coded with debrilalation 11-Apr-2023 and 5/17. CPR/chest compressions 11-Apr-2023. Long QTc intervals.  Sinus Brady and junctional rhythm.  * Nausea, loose stools.  C diff negative.  MultifactoriaL: Narcotics, post gastrectomy status, recent codes x 2, diabetes and possible diabetic gastroparesis. ? Viral GI illness ?  Hypoperfusion/ischemia?  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 Takes daily PPI.  * Chronic neck pain treated with chronic Lortab. Likely contributing to nausea. Current dosing of one Lortab daily, norm is 3 to 4, so be experiencing withdrawal related nausea.  * Absent GB on ultrasound, seems she has had cholecystectomy.  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 Recurrent TR, pulmonary hypertension, but too frail for surgery.  *  CHF.  LVEF worse by cath/echo at 30 to 35%.    *  NIDDM    PLAN: *  Add Protonix.  *  For ICD/DDD today.  *  Consider EGD in next couple of days.     LOS: 5 days   Jennye Moccasin  03/31/2013, 8:27 AM Pager: [phone] I have reviewed the  above note, examined the patient and agree with plan of treatment.She just returned from the cath lab. Nursing staff reports dyspepsia, belching etc, r/o outlet obstruction, post - gastrectomy or gastroparesis. Will plan to EGD when stable.  Willa [facility] Pager # [phone]

## 2013-03-31 NOTE — Op Note (Signed)
NAMESHARESA, KEMP NO.:  [id number]  MEDICAL RECORD NO.:  [id number]  LOCATION:  2928                         FACILITY:  [facility]  PHYSICIAN:  Doylene Canning. Ladona Ridgel, MD    DATE OF BIRTH:  Oct 11, 1941  DATE OF PROCEDURE:  03/31/2013 DATE OF DISCHARGE:                              OPERATIVE REPORT   PROCEDURE PERFORMED:  Insertion of a dual-chamber ICD by way of the right subclavian vein.  INTRODUCTION:  The patient is a 72 year old woman who presented to the hospital with ventricular fibrillation, diarrhea, and was found to have a prolonged QT interval.  Had mild hypokalemia.  Her potassium was repleted and her diarrhea was improved and she continued to have episodes of ventricular fibrillation despite medical therapy.  She also had profound bradycardia, required inotropes to help sustain her heart rate.  She is now referred for dual-chamber ICD insertion secondary to long QT syndrome with no reversible causes.  The beta-blocker was not used because of profound bradycardia at baseline.  PROCEDURE IN DETAIL:  After informed consent was obtained, the patient was taken to the diagnostic EP lab in a fasting state.  After usual preparation and draping, intravenous fentanyl and midazolam was given for sedation.  A 30 mL of lidocaine was infiltrated into the right infraclavicular region and a 7-cm incision was carried out over this region.  Electrocautery was utilized to dissect down to the fascial plane.  The right subclavian vein was then punctured and the Medtronic model 693558 cm active fixation defibrillation lead, serial number [id number] was advanced to the right ventricle and the Medtronic model 507645 cm active fixation pacing lead serial number [id number] was advanced to the right atrium.  It was very difficult to get the defibrillation lead across the tricuspid valve as the right atrium was very large and once we got the lead across, at least 50 sites were mapped  for placement of the defibrillation lead.  The R-waves were reduced throughout.  At the final site, the R-waves were approximately 5.5 mV, whereas almost all other sites were 2-3 mV.  With active fixation to the defibrillation lead, there was a modest injury.  A 10 V pacing did not stimulate the diaphragm.  With these satisfactory parameters, attention was then turned to the atrial lead.  The patient had a prior tricuspid valve repair and her right atrium was very large. As expected, the P-waves were reduced throughout typically 0.2 to 0.5. At the final site, the P-waves measured 1.3 mV.  The lead was actively fixed and there was a satisfactory injury current with active fixation of the lead.  A 10 V pacing did not stimulate the diaphragm.  With the atrial and ventricular leads in satisfactory position, they were secured to the subpectoral fascia with a figure-of-eight silk suture and the sewing sleeve was secured with silk suture.  Electrocautery was utilized to make a subcutaneous pocket.  Antibiotic irrigation was utilized to irrigate the pocket.  Electrocautery was utilized to assure hemostasis. The Medtronic Evera XT DR dual-chamber defibrillator, serial number [id number] was connected to the atrial and RV leads and placed back in the subcutaneous pocket.  The generator was secured with  silk suture. The pocket was irrigated with antibiotic irrigation.  The incision was closed with 2-0 and 3-0 Vicryl.  At this point, I scrubbed out of the case and supervised defibrillation threshold testing.  At this point, under my direct supervision, the patient was more deeply sedated with intravenous fentanyl and Versed.  VF was induced with a T- wave shock.  A 20-joule shock was delivered, which terminated ventricular fibrillation and restored sinus rhythm, with a shocking impedance of 59 ohms.  There were satisfactory sensing during induction. With all of the above, Benzoin and Steri-Strips were  painted on the skin.  A pressure dressing was applied.  The patient was returned to her room in satisfactory condition.  COMPLICATIONS:  There were no immediate procedure complications.  RESULTS:  This demonstrates successful implantation of a Medtronic dual- chamber ICD in a patient with recurrent ventricular fibrillation, long QT syndrome, not reversible and sinus bradycardia.     Doylene Canning. Ladona Ridgel, MD     GWT/MEDQ  D:  03/31/2013  T:  03/31/2013  Job:  [id number]

## 2013-04-01 ENCOUNTER — Inpatient Hospital Stay (HOSPITAL_COMMUNITY): Payer: Medicare Other

## 2013-04-01 DIAGNOSIS — K250 Acute gastric ulcer with hemorrhage: Secondary | ICD-10-CM

## 2013-04-01 DIAGNOSIS — I5021 Acute systolic (congestive) heart failure: Secondary | ICD-10-CM

## 2013-04-01 DIAGNOSIS — E876 Hypokalemia: Secondary | ICD-10-CM

## 2013-04-01 LAB — BASIC METABOLIC PANEL
CO2: 20 mEq/L (ref 19–32)
Calcium: 7.2 mg/dL — ABNORMAL LOW (ref 8.4–10.5)
Chloride: 107 mEq/L (ref 96–112)
Glucose, Bld: 143 mg/dL — ABNORMAL HIGH (ref 70–99)
Potassium: 3.4 mEq/L — ABNORMAL LOW (ref 3.5–5.1)
Sodium: 141 mEq/L (ref 135–145)

## 2013-04-01 LAB — CBC
Hemoglobin: 9.7 g/dL — ABNORMAL LOW (ref 12.0–15.0)
Platelets: 196 10*3/uL (ref 150–400)
RBC: 3.59 MIL/uL — ABNORMAL LOW (ref 3.87–5.11)
WBC: 13.1 10*3/uL — ABNORMAL HIGH (ref 4.0–10.5)

## 2013-04-01 NOTE — Progress Notes (Signed)
Pt ambulated about 100 feet in hallway; became tachycardia, max HR 145, upon resting in hallway, pt's HR returned to WNL quickly upon resting; pt stated she felt SOB, O2 sats WNL - 95%

## 2013-04-01 NOTE — Progress Notes (Addendum)
Subjective:  Ms. Fishburn has no complaints this AM. She is eager to get out of bed and move around some. She denies CP or SOB.   Objective:  Vital Signs in the last 24 hours: Temp:  [97.6 F (36.4 C)-98.6 F (37 C)] 98.4 F (36.9 C) (05/22 0400) Pulse Rate:  [76-96] 78 (05/22 0400) Resp:  [14-27] 14 (05/22 0400) BP: (130-156)/(58-97) 156/75 mmHg (05/22 0400) SpO2:  [94 %-100 %] 100 % (05/22 0400) Weight:  [151 lb 14.4 oz (68.9 kg)] 151 lb 14.4 oz (68.9 kg) (05/22 0601)  Intake/Output from previous day: 05/21 0701 - 05/22 0700 In: 1300 [P.O.:240; I.V.:1060] Out: 2050 [Urine:2050]  Physical Exam: General: Elderly appearing 72 year old female, lying in bed, NAD HEENT: [place], AT, EOMs intact, oropharynx clear and non-erythematous  Neck: Supple, no JVD Lungs: Clear to ascultation bilaterally, normal work of respiration, no wheezes, rales, ronchi Heart: Regular rate and rhythm, grade III/VI systolic murmur, no rub, S3 or S4 Abdomen: Soft, non-tender, non-distended, normal bowel sounds Extremities: No cyanosis, clubbing or edema Neurologic: Alert and oriented x3, no focal deficits Skin: Right upper chest/implant site intact with pressure dressing in place  Lab Results:  Recent Labs  03/31/13 0738 04/01/13 0416  WBC 12.9* 13.1*  HGB 10.6* 9.7*  PLT 235 196    Recent Labs  03/31/13 0738 04/01/13 0416  NA 144 141  K 3.9 3.4*  CL 110 107  CO2 17* 20  GLUCOSE 143* 143*  BUN 14 11  CREATININE 0.95 0.93    Cardiac Studies: Echocardiogram 03/27/13 - Left ventricle: The cavity size was normal. Wall thickness was normal. Systolic function was normal. The estimated ejection fraction was in the range of 55% to 60%. Wall motion was normal; there were no regional wall motion abnormalities. - Ventricular septum: Septal motion showed abnormal function and dyssynergy. - Aortic valve: Trivial regurgitation. - Mitral valve: A bioprosthesis was present. Valve area by pressure  half-time: 2.32cm^2. - Left atrium: The atrium was mildly dilated. - Right atrium: The atrium was mildly dilated. - Tricuspid valve: Prior procedures included surgical repair. Moderate-severe regurgitation. - Pulmonary arteries: Systolic pressure was mildly increased. PA peak pressure: 46mm Hg (S).  Cardiac Catheterization 03/30/13 Hemodynamics  RA 14/19 with a mean of 13 mm Hg  RV 56/6 mm Hg  PA 61/21 mean of 35 mm Hg  PCWP 20/18 mean of 17 mm Hg.  LV 107/10 mm Hg  AO 109/59 mean of 77 mm Hg  Mean MV gradient of 12 mm Hg with MV area of .85 cm2  No significant AV gradient.  Oxygen saturations:  PA 48%  AO 97%  Cardiac Output (Fick) 3.19 L/min  Cardiac Index (Fick) 1.87 L/min/m2  Coronary angiography:  Coronary dominance: left  Left mainstem: Normal  Left anterior descending (LAD): 20-30% at takeoff of first diagonal  Left circumflex (LCx): 40% in mid vessel after OM2, large dominant vessel.  Right coronary artery (RCA): nondominant. normal  Left ventriculography: Left ventricular systolic function is abnormal, LVEF is estimated at 30-35%, There is severe hypokinesis of the mid to distal anterior and inferior walls and apex, there is no significant mitral regurgitation  Final Conclusions:  1. Nonobstructive CAD with left dominant circulation  2. Severe LV dysfunction  3. Moderate mitral stenosis. (MV prosthesis)  4. Moderate pulmonary HTN.  Recommendations: Proceed with ICD placement. Cath data is at odds with echo data. Will need to review.   Telemetry: NSR, few PVC's Device interrogation: pending this  AM  Assessment/Plan:  1. VF arrest/Long QT - Cardiac cath 5/20 shows non-obstructive CAD.  ECGs shows persistent long QTc. Now s/p ICD implantation yesterday. Arranged outpatient [employer] testing. Device interrogation pending. CXR pending. 2. Hypokalemia - Continue daily BMET's and replete as needed. 3. Acute systolic HF - Echocardiogram 5/17 showed an EF of 55%, though cardiac  cath 5/20 showed an EF of 30-35%.  Review of prior echos over the last 2 years reveals wide variation in EF from 30-55%. Continue Lasix. 3. Nausea - Improving. GI consulted and appreciate recs. Continue reglan, compazine. Avoid Zofran as this can prolong QT 4. Diarrhea - C.diff negative EDMISTEN, BROOKE, P 04/01/2013, 7:24 AM  EP Attending  Patient seen and examined. Agree with above history, exam, assessment and plan. Will increase activity/ambulate today. Hopefully she will be ready for dc tomorrow. She is too weak for dc today.  [name].D.

## 2013-04-01 NOTE — Progress Notes (Signed)
[facility] Daily Rounding Note 04/01/2013, 8:27 AM  SUBJECTIVE:        She had loose stools a couple of days last week but nausea began after she got to [place].  Generally no stomach trouble, takes Protonix once daily Neck pain is controlled.  She feels a lot better  OBJECTIVE:         Vital signs in last 24 hours:    Temp:  [97.6 F (36.4 C)-98.7 F (37.1 C)] 98.7 F (37.1 C) (05/22 0725) Pulse Rate:  [76-95] 87 (05/22 0725) Resp:  [14-27] 20 (05/22 0725) BP: (130-161)/(58-79) 161/72 mmHg (05/22 0725) SpO2:  [94 %-100 %] 99 % (05/22 0725) Weight:  [68.9 kg (151 lb 14.4 oz)] 68.9 kg (151 lb 14.4 oz) (05/22 0601) Last BM Date: 03/31/13 General: much more alert.  Frail, osteoporotic looking. Weak.  Heart: Irregular, A fib on monitor.  ICD in place on upper right.  Chest: rales at bases, right > left Abdomen: soft, NT, ND, active BS.    Extremities: no pedal edema Neuro/Psych:  Pleasant,  Not confused.  Moves all 4 limbs.   Intake/Output from previous day: 05/21 0701 - 05/22 0700 In: 1300 [P.O.:240; I.V.:1060] Out: 2050 [Urine:2050]  Intake/Output this shift:    Lab Results:  Recent Labs  03/30/13 0500 03/31/13 0738 04/01/13 0416  WBC 14.4* 12.9* 13.1*  HGB 10.7* 10.6* 9.7*  HCT 34.1* 33.5* 31.6*  PLT 250 235 196   BMET  Recent Labs  03/30/13 0500 03/30/13 1000 03/31/13 0738 04/01/13 0416  NA 139  --  144 141  K 4.5 4.4 3.9 3.4*  CL 107  --  110 107  CO2 17*  --  17* 20  GLUCOSE 135*  --  143* 143*  BUN 12  --  14 11  CREATININE 0.94  --  0.95 0.93  CALCIUM 7.3*  --  7.2* 7.2*   LFT  Recent Labs  03/31/13 0738  PROT 6.5  ALBUMIN 3.0*  AST 14  ALT 8  ALKPHOS 81  BILITOT 0.5   Studies/Results: US Abdomen Complete 03/31/2013  IMPRESSION:  1.  Nonvisualization of gallbladder, presumably due to prior cholecystectomy.  No evidence of biliary dilatation. 2.  Left renal parenchymal atrophy and cyst. No evidence of hydronephrosis.   Original Report  Authenticated By: Myles Rosenthal, M.D.    ASSESMENT: * V fib arrest/long QT. Coded with defibrilalation 2023/04/08 and 5/17. CPR/chest compressions Apr 08, 2023. Sinus Brady and junctional rhythm.  S/P ICD/DDD 5/21  * Nausea, loose stools. C diff negative.  Nausea resolved.  Loose stools, but not frequent or copious, continue. 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 Takes daily PPI  Pt says consequential  H Pylori testing negative.  She used a lot of ASA for migraines at that time.  * Chronic neck pain treated with chronic Lortab. Likely contributing to nausea. Current dosing of one Lortab daily, norm is 3 to 4, so be experiencing withdrawal related nausea.  * Absent GB on ultrasound, seems she has had cholecystectomy. 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 Recurrent TR, pulmonary hypertension, but too frail for surgery.  * CHF. LVEF worse by cath/echo at 30 to 35%.  * NIDDM *  Anemia. Not surprising to see drop in Hgb post cath and ICD/DDD insertion.   PLAN: *  Advance diet, continue once daily PPI (takes Protonix at home).  Pt prefers not to pursue EGD unless absolutely necessary.  If no recurrent nausea, she does not need EGD.   LOS: 6 days   Jennye Moccasin  04/01/2013, 8:27 AM Pager: [phone]  I have reviewed the above note, examined the patient and agree with plan of treatment.Much improved but abdomen still distended and tympanitis. She needs to ambulate and continue Simethicone prn. No plans for EGD at this time.  Willa [facility] Pager # [phone]

## 2013-04-01 NOTE — Care Management Note (Addendum)
    Page 1 of 2   04/06/2013     9:33:11 AM   CARE MANAGEMENT NOTE 04/06/2013  Patient:  [name], [name]   Account Number:  [id number]  Date Initiated:  03/29/2013  Documentation initiated by:  Junius Creamer  Subjective/Objective Assessment:   adm w [place]     Action/Plan:   lives w husband, pcp dr ed Darius Bump   Anticipated DC Date:  04/04/2013   Anticipated DC Plan:  HOME W HOME HEALTH SERVICES      DC Planning Services  CM consult      [facility] Choice  HOME HEALTH   Choice offered to / List presented to:  C-1 Patient        HH arranged  HH-1 RN  HH-10 DISEASE MANAGEMENT      HH agency  [facility]   Status of service:   Medicare Important Message given?   (If response is "NO", the following Medicare IM given date fields will be blank) Date Medicare IM given:   Date Additional Medicare IM given:    Discharge Disposition:  HOME W HOME HEALTH SERVICES  Per UR Regulation:  Reviewed for med. necessity/level of care/duration of stay  If discussed at Long Length of Stay Meetings, dates discussed:   04/01/2013    Comments:  late entry:  5/27 0928 debbie [name] rn,bsn pt disch on 5/25. faxed dc summary to [facility] in [place].  5/22 1234p debbie [name] rn,bsn spoke w pt and husband. pt has hx of hhc w [facility] and does not want to use them again. agreeable to hhc w another agency. ref to lois at [facility] for hhrn. will need face to face and hhrn order at disch. will leave sticky note for md.

## 2013-04-02 DIAGNOSIS — I5021 Acute systolic (congestive) heart failure: Secondary | ICD-10-CM

## 2013-04-02 LAB — BASIC METABOLIC PANEL
BUN: 11 mg/dL (ref 6–23)
CO2: 20 mEq/L (ref 19–32)
Chloride: 103 mEq/L (ref 96–112)
GFR calc Af Amer: 74 mL/min — ABNORMAL LOW (ref >90)
GFR calc non Af Amer: 64 mL/min — ABNORMAL LOW (ref >90)

## 2013-04-02 LAB — CBC
Hemoglobin: 10.4 g/dL — ABNORMAL LOW (ref 12.0–15.0)
MCH: 27.2 pg (ref 26.0–34.0)
MCV: 86.6 fL (ref 78.0–100.0)
Platelets: 174 10*3/uL (ref 150–400)
RBC: 3.82 MIL/uL — ABNORMAL LOW (ref 3.87–5.11)
WBC: 12.4 10*3/uL — ABNORMAL HIGH (ref 4.0–10.5)

## 2013-04-02 MED ORDER — POTASSIUM CHLORIDE CRYS ER 20 MEQ PO TBCR
40.0000 meq | EXTENDED_RELEASE_TABLET | Freq: Two times a day (BID) | ORAL | Status: AC
  Administered 2013-04-02 (×2): 40 meq via ORAL
  Filled 2013-04-02 (×2): qty 2

## 2013-04-02 MED ORDER — FUROSEMIDE 40 MG PO TABS
40.0000 mg | ORAL_TABLET | Freq: Two times a day (BID) | ORAL | Status: DC
  Administered 2013-04-02 – 2013-04-04 (×5): 40 mg via ORAL
  Filled 2013-04-02 (×7): qty 1

## 2013-04-02 MED ORDER — METOPROLOL TARTRATE 25 MG PO TABS
25.0000 mg | ORAL_TABLET | Freq: Two times a day (BID) | ORAL | Status: DC
  Administered 2013-04-02 – 2013-04-04 (×5): 25 mg via ORAL
  Filled 2013-04-02 (×6): qty 1

## 2013-04-02 MED ORDER — POTASSIUM CHLORIDE 10 MEQ/100ML IV SOLN
10.0000 meq | INTRAVENOUS | Status: AC
  Administered 2013-04-02 (×2): 10 meq via INTRAVENOUS
  Filled 2013-04-02 (×2): qty 100

## 2013-04-02 NOTE — Progress Notes (Addendum)
Subjective:  [name] has no complaints this AM. She is eager to get out of bed and move around some. She denies CP, SOB or palpitations.   Objective:  Vital Signs in the last 24 hours: Temp:  [98.2 F (36.8 C)-99.1 F (37.3 C)] 98.2 F (36.8 C) (05/23 0400) Pulse Rate:  [86-105] 105 (05/23 0450) Resp:  [18-22] 21 (05/23 0450) BP: (138-163)/(61-75) 156/71 mmHg (05/23 0450) SpO2:  [95 %-98 %] 96 % (05/23 0450) Weight:  [151 lb 8 oz (68.72 kg)] 151 lb 8 oz (68.72 kg) (05/23 0500)  Intake/Output from previous day: 05/22 0701 - 05/23 0700 In: 1053 [P.O.:900; I.V.:53; IV Piggyback:100] Out: 2300 [Urine:2300]  Physical Exam: General: Elderly appearing 72 year old female, lying in bed, NAD HEENT: [place], AT, EOMs intact, oropharynx clear and non-erythematous  Neck: Supple, no JVD Lungs: Clear to ascultation bilaterally, normal work of respiration, no wheezes, rales, ronchi Heart: IRegular tachy rhythm, grade III/VI systolic murmur, no rub, S3 or S4 Abdomen: Soft, non-tender, non-distended, normal bowel sounds Extremities: No cyanosis, clubbing or edema Neurologic: Alert and oriented x3, no focal deficits Skin: Right upper chest/implant site intact with pressure dressing in place  Lab Results:  Recent Labs  04/01/13 0416 04/02/13 0425  WBC 13.1* 12.4*  HGB 9.7* 10.4*  PLT 196 174    Recent Labs  04/01/13 0416 04/02/13 0425  NA 141 138  K 3.4* 2.8*  CL 107 103  CO2 20 20  GLUCOSE 143* 133*  BUN 11 11  CREATININE 0.93 0.90    Cardiac Studies: Echocardiogram 03/27/13 - Left ventricle: The cavity size was normal. Wall thickness was normal. Systolic function was normal. The estimated ejection fraction was in the range of 55% to 60%. Wall motion was normal; there were no regional wall motion abnormalities. - Ventricular septum: Septal motion showed abnormal function and dyssynergy. - Aortic valve: Trivial regurgitation. - Mitral valve: A bioprosthesis was  present. Valve area by pressure half-time: 2.32cm^2. - Left atrium: The atrium was mildly dilated. - Right atrium: The atrium was mildly dilated. - Tricuspid valve: Prior procedures included surgical repair. Moderate-severe regurgitation. - Pulmonary arteries: Systolic pressure was mildly increased. PA peak pressure: 46mm Hg (S).  Cardiac Catheterization 03/30/13 Hemodynamics  RA 14/19 with a mean of 13 mm Hg  RV 56/6 mm Hg  PA 61/21 mean of 35 mm Hg  PCWP 20/18 mean of 17 mm Hg.  LV 107/10 mm Hg  AO 109/59 mean of 77 mm Hg  Mean MV gradient of 12 mm Hg with MV area of .85 cm2  No significant AV gradient.  Oxygen saturations:  PA 48%  AO 97%  Cardiac Output (Fick) 3.19 L/min  Cardiac Index (Fick) 1.87 L/min/m2  Coronary angiography:  Coronary dominance: left  Left mainstem: Normal  Left anterior descending (LAD): 20-30% at takeoff of first diagonal  Left circumflex (LCx): 40% in mid vessel after OM2, large dominant vessel.  Right coronary artery (RCA): nondominant. normal  Left ventriculography: Left ventricular systolic function is abnormal, LVEF is estimated at 30-35%, There is severe hypokinesis of the mid to distal anterior and inferior walls and apex, there is no significant mitral regurgitation  Final Conclusions:  1. Nonobstructive CAD with left dominant circulation  2. Severe LV dysfunction  3. Moderate mitral stenosis. (MV prosthesis)  4. Moderate pulmonary HTN.  Recommendations: Proceed with ICD placement. Cath data is at odds with echo data. Will need to review.    12-lead ECG: this AM shows  AF at 94 bpm, RBBB, prolonged QT Telemetry: atrial fibrillation Device interrogation: performed by industry yesterday and shows normal ICD function   Assessment/Plan:  1. VF arrest/Long QT - Cardiac cath 5/20 shows non-obstructive CAD.  ECGs shows persistent long QTc. Now s/p ICD implantation yesterday. Arranged outpatient [employer] testing.  2. Atrial fibrillation - h/o PAF.  Went back into AF early yesterday AM, was in SR around 5:30 AM. In last 24 hours, rate mostly <110. Not a good candidate for most AADs given baseline BBB and long QT. In terms of anticoagulation, given h/o rheumatic heart disease and MVR, only candidate for Coumadin however this was d/c'd in the past due to falls. She continues to be high risk for falls and remains a poor candidate for anticoagulation. Taking all of this into account, will continue rate control approach. Will add metoprolol back today. 3. Hypokalemia - Will replete; receiving 2 runs IV KCl currently; patient willing to try PO KCl in applesauce 4. Acute systolic HF - Echocardiogram 5/17 showed an EF of 55%, though cardiac cath 5/20 showed an EF of 30-35%.  Review of prior echos over the last 2 years reveals wide variation in EF from 30-55%. Continue Lasix. 5. Nausea - Improved. GI consulted and appreciate recs. Continue reglan, compazine. Avoid Zofran as this can prolong QT  Dr. Ladona Ridgel to see [name] Duff, PA-C 04/02/2013, 7:56 AM  EP Attending  Patient seen and examined. Agree with above history, exam and plan with modifications. She now has atrial fib with RVR, particularly with ambulation. She will need improved rate control prior to discharge. In addition, she has continued to have hypokalemia even though her vomiting has resolved. She will need careful avoidance of QT prolonging medications at discharge which I suspect will not be for an additional 48 hours unless she spontaneously goes back into nsr and her potassium stabilizes. She will need close followup as she is prone to hypokalemia.  Leonia Reeves.D.

## 2013-04-02 NOTE — Progress Notes (Signed)
Pt is doing well, up and down from bed to chair and to bedside commode. Pts HR rose to 131 while walking around the room, and decreased to 91 when pt sat down. o2 sats were 96% on RA.  The pt is however c/o anxiousness, and asking to have her xanax dose increased. Will pass along to oncoming RN.

## 2013-04-03 LAB — BASIC METABOLIC PANEL
GFR calc Af Amer: 68 mL/min — ABNORMAL LOW (ref >90)
GFR calc non Af Amer: 58 mL/min — ABNORMAL LOW (ref >90)
Glucose, Bld: 133 mg/dL — ABNORMAL HIGH (ref 70–99)
Potassium: 4 mEq/L (ref 3.5–5.1)
Sodium: 137 mEq/L (ref 135–145)

## 2013-04-03 LAB — CBC
Hemoglobin: 10.8 g/dL — ABNORMAL LOW (ref 12.0–15.0)
MCHC: 30.9 g/dL (ref 30.0–36.0)
Platelets: 130 10*3/uL — ABNORMAL LOW (ref 150–400)

## 2013-04-03 MED ORDER — ONDANSETRON HCL 4 MG/2ML IJ SOLN
4.0000 mg | Freq: Four times a day (QID) | INTRAMUSCULAR | Status: DC | PRN
  Administered 2013-04-03 – 2013-04-04 (×2): 4 mg via INTRAVENOUS
  Filled 2013-04-03 (×2): qty 2

## 2013-04-03 MED ORDER — POTASSIUM CHLORIDE CRYS ER 20 MEQ PO TBCR
40.0000 meq | EXTENDED_RELEASE_TABLET | Freq: Every day | ORAL | Status: DC
  Administered 2013-04-03: 40 meq via ORAL
  Filled 2013-04-03 (×2): qty 2

## 2013-04-03 NOTE — Progress Notes (Signed)
   SUBJECTIVE:  She says that she slept well.  No pain   PHYSICAL EXAM Filed Vitals:   04/02/13 1910 04/02/13 2000 04/03/13 0000 04/03/13 0500  BP: 159/86 136/57 144/66   Pulse: 76 84    Temp:   98.6 F (37 C)   TempSrc:   Oral   Resp: 25 19    Height:      Weight:    151 lb 0.2 oz (68.5 kg)  SpO2: 100% 98% 98%    General:  No distress Lungs:  Clear Heart:  RRR Abdomen:  Positive bowel sounds, no rebound no guarding Extremities:  Mild edema  LABS: Lab Results  Component Value Date   TROPONINI <0.30 03/27/2013   No results found for this or any previous visit (from the past 24 hour(s)).  Intake/Output Summary (Last 24 hours) at 04/03/13 0734 Last data filed at 04/02/13 2139  Gross per 24 hour  Intake   1063 ml  Output    200 ml  Net    863 ml    ASSESSMENT AND PLAN:  Torsades de pointes:  Status post ICD.    Hypokalemia:  BMET pending.  For now I will continue the current Lasix.   Atrial fib:  Now in atrial paced rhythm.    OK to transfer to floor.  Possibly discharge in the AM.     Rollene Rotunda 04/03/2013 7:34 AM

## 2013-04-04 LAB — BASIC METABOLIC PANEL WITH GFR
BUN: 21 mg/dL (ref 6–23)
CO2: 14 meq/L — ABNORMAL LOW (ref 19–32)
Calcium: 7.2 mg/dL — ABNORMAL LOW (ref 8.4–10.5)
Chloride: 106 meq/L (ref 96–112)
Creatinine, Ser: 1 mg/dL (ref 0.50–1.10)
GFR calc Af Amer: 65 mL/min — ABNORMAL LOW
GFR calc non Af Amer: 56 mL/min — ABNORMAL LOW
Glucose, Bld: 134 mg/dL — ABNORMAL HIGH (ref 70–99)
Potassium: 4.9 meq/L (ref 3.5–5.1)
Sodium: 137 meq/L (ref 135–145)

## 2013-04-04 LAB — CBC
Hemoglobin: 10.6 g/dL — ABNORMAL LOW (ref 12.0–15.0)
RBC: 3.84 MIL/uL — ABNORMAL LOW (ref 3.87–5.11)
WBC: 14.1 10*3/uL — ABNORMAL HIGH (ref 4.0–10.5)

## 2013-04-04 MED ORDER — FUROSEMIDE 40 MG PO TABS: 40.0000 mg | ORAL_TABLET | Freq: Two times a day (BID) | ORAL | Status: DC

## 2013-04-04 MED ORDER — METOPROLOL TARTRATE 25 MG PO TABS: 25.0000 mg | ORAL_TABLET | Freq: Two times a day (BID) | ORAL | Status: DC

## 2013-04-04 MED ORDER — SIMETHICONE 80 MG PO CHEW: 160.0000 mg | CHEWABLE_TABLET | Freq: Four times a day (QID) | ORAL | Status: DC | PRN

## 2013-04-04 MED ORDER — POTASSIUM CHLORIDE CRYS ER 20 MEQ PO TBCR: 40.0000 meq | EXTENDED_RELEASE_TABLET | Freq: Every day | ORAL | Status: DC

## 2013-04-04 NOTE — Progress Notes (Signed)
Discharge instructions and prescriptions given.  Patient given chf packet and taught about keeping track of her weight and when to contact the doctor.  No other questions asked.  Patient left via wheelchair to daughter waiting. Rubye Oaks

## 2013-04-04 NOTE — Progress Notes (Signed)
Patient ID: [name], female   DOB: 11-Oct-1941, 72 y.o.   MRN: [id number]   SUBJECTIVE:  NO complaints NO dyspnea or chest pain   PHYSICAL EXAM Filed Vitals:   04/03/13 1200 04/03/13 2100 04/04/13 0058 04/04/13 0458  BP: 123/71 118/72 116/76 131/80  Pulse: 70 76 73 77  Temp: 97.6 F (36.4 C) 97.8 F (36.6 C) 97.5 F (36.4 C) 97.4 F (36.3 C)  TempSrc: Oral Oral Oral Oral  Resp: 20 18 24 18   Height: 5\' 1"  (1.549 m)     Weight: 146 lb 1.6 oz (66.271 kg)   149 lb 8 oz (67.813 kg)  SpO2: 98% 99% 97% 96%   General:  No distress Lungs:  Clear Heart:  RRR Abdomen:  Positive bowel sounds, no rebound no guarding Extremities:  Mild edema AICD:  Site looks great under right clavicle  LABS: Lab Results  Component Value Date   TROPONINI <0.30 03/27/2013   Results for orders placed during the hospital encounter of 03/26/13 (from the past 24 hour(s))  CBC     Status: Abnormal   Collection Time    04/04/13  4:30 AM      Result Value Range   WBC 14.1 (*) 4.0 - 10.5 K/uL   RBC 3.84 (*) 3.87 - 5.11 MIL/uL   Hemoglobin 10.6 (*) 12.0 - 15.0 g/dL   HCT 13.0 (*) 86.5 - 78.4 %   MCV 85.4  78.0 - 100.0 fL   MCH 27.6  26.0 - 34.0 pg   MCHC 32.3  30.0 - 36.0 g/dL   RDW 69.6 (*) 29.5 - 28.4 %   Platelets 104 (*) 150 - 400 K/uL  BASIC METABOLIC PANEL     Status: Abnormal   Collection Time    04/04/13  4:30 AM      Result Value Range   Sodium 137  135 - 145 mEq/L   Potassium 4.9  3.5 - 5.1 mEq/L   Chloride 106  96 - 112 mEq/L   CO2 14 (*) 19 - 32 mEq/L   Glucose, Bld 134 (*) 70 - 99 mg/dL   BUN 21  6 - 23 mg/dL   Creatinine, Ser 1.32  0.50 - 1.10 mg/dL   Calcium 7.2 (*) 8.4 - 10.5 mg/dL   GFR calc non Af Amer 56 (*) >90 mL/min   GFR calc Af Amer 65 (*) >90 mL/min    Intake/Output Summary (Last 24 hours) at 04/04/13 0849 Last data filed at 04/04/13 0500  Gross per 24 hour  Intake    240 ml  Output    403 ml  Net   -163 ml    ASSESSMENT AND PLAN:  Torsades de pointes:   Status post ICD.    Hypokalemia:  Resolved BMET good this am  Atrial fib:  Now in atrial paced rhythm.    Discharge home     Charlton Haws 04/04/2013 8:49 AM

## 2013-04-04 NOTE — Discharge Summary (Signed)
Physician Discharge Summary  Patient ID: [name] MRN: [id number] DOB/AGE: [birth year] 72 y.o.  Admit date: 03/26/2013 Discharge date: 04/04/2013  Primary Discharge Diagnosis  1. VF arrest 2. Severe Hypokalemia with prolonged QT interval 3. S/P ICD Pacemaker 4. Severe Systolic Dysfunction  Secondary Discharge Diagnosis 1. Non-obstructive CAD (Cath 03/30/2013) 2. Diabetes 3. 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 PAH with biventricular failure  Significant Diagnostic Studies: 1. Cardiac Cath 03/30/2013   Coronary angiography:  Coronary dominance: left  Left mainstem: Normal  Left anterior descending (LAD): 20-30% at takeoff of first diagonal  Left circumflex (LCx): 40% in mid vessel after OM2, large dominant vessel.  Right coronary artery (RCA): nondominant. normal  Left ventriculography: Left ventricular systolic function is abnormal, LVEF is estimated at 30-35%, There is severe hypokinesis of the mid to distal anterior and inferior walls and apex, there is no significant mitral regurgitation  Final Conclusions:  1. Nonobstructive CAD with left dominant circulation  2. Severe LV dysfunction  3. Moderate mitral stenosis. (MV prosthesis)  4. Moderate pulmonary HTN.  2. S/P implantation of a Medtronic dual- chamber ICD serial number [id number] H 03/31/2013   Consults:  1. Lewayne Bunting MD-EP 2. Lina Sar MD-GI  Hospital Course: [name] is a medically complicated (please review PMH)  72 year old patient admitted on transfer from [place] on 03/26/2013 in the setting of V. fib arrest secondary to severe hypokalemia as a result of severe nausea and diarrhea and use of diuretics.. She is normally followed in the heart failure, PAH clinic per Dr. Gala Romney. Potassium was found to be 2.8. This was repleted and the patient remained in ICU on amiodarone drip, but she still lidocaine drip in the setting of prolonged QT interval. CVP  line was also inserted an IV fluid hydration provided.. The following day the patient had an other episode of V. tach, one self terminating, one defibrillation with CODE BLUE called. The patient received 120 J shock with return to normal sinus rhythm. This was related to stopping lidocaine momentarily to change infusion site.     The patient over the course of the next 2 days also had acute on chronic CHF with a 15 pound weight gain, and IV fluid hydration. The patient had also been placed on dopamine temporarily do to hypotension. Once the patient became stable, with electrolytes normalized, the patient was scheduled for cardiac catheterization. In the interim she was seen by, GI physicians on consultation secondary to intractable nausea vomiting and diarrhea. She started on a PPI, EGD was discussed but not planned during hospitalization as her symptoms resolved. Ultrasound of the abdomen did not demonstrate any acute abnormalities.  On 03/30/2013 a cardiac catheterization was completed by Dr. Peter Swaziland demonstrating nonobstructive CAD with severe systolic dysfunction with EF of 30-35%, severe hypokinesis of the mid to distal anterior inferior walls and apex. ICD implantation was planned and completed per Dr. Sharrell Ku on 03/31/2013. This was a Medtronic dual-chamber ICD. The patient had episodes of atrial fibrillation, with prior history of same. She has not started on anticoagulation secondary to frequent falls. She was restarted on metoprolol daily. Potassium was increased to 40 mEq daily. She remained in paced rhythm throughout the remainder of somatization.  Concerning heart failure symptoms, the patient's Demadex was discontinued and she was started on Lasix 40 mg twice a day. She remained on metolazone. Weight on admission 143 pounds, weight on discharge 149 pounds. With highest weight recorded during hospitalization of 157  pounds. Potassium on discharge 4.0, with a sodium of 137, creatinine 0.97.    The patient was seen and examined by Dr. Charlton Haws on day of discharge and found to be stable. She will be discharged home on medications as listed, with close followup in the office with Dr. Gala Romney, and pacemaker clinic. Post ICD pacemaker instructions are provided to the patient in writing and verbally.  Discharge Exam: Blood pressure 131/80, pulse 77, temperature 97.4 F (36.3 C), temperature source Oral, resp. rate 18, height 5\' 1"  (1.549 m), weight 149 lb 8 oz (67.813 kg), SpO2 96.00%.   Labs:   Lab Results  Component Value Date   WBC 14.1* 04/04/2013   HGB 10.6* 04/04/2013   HCT 32.8* 04/04/2013   MCV 85.4 04/04/2013   PLT 104* 04/04/2013    Recent Labs Lab 03/31/13 0738  04/04/13 0430  NA 144  < > 137  K 3.9  < > 4.9  CL 110  < > 106  CO2 17*  < > 14*  BUN 14  < > 21  CREATININE 0.95  < > 1.00  CALCIUM 7.2*  < > 7.2*  PROT 6.5  --   --   BILITOT 0.5  --   --   ALKPHOS 81  --   --   ALT 8  --   --   AST 14  --   --   GLUCOSE 143*  < > 134*  < > = values in this interval not displayed. Lab Results  Component Value Date   TROPONINI <0.30 03/27/2013        Radiology: Dg Chest 2 View  04/01/2013   *RADIOLOGY REPORT*  Clinical Data: Status post ICD-9 placement  CHEST - 2 VIEW  Comparison: Chest radiograph 03/29/2013  Findings: Interval placement left-sided pacemaker with two continuous leads.  Atrial appendage clip noted.  There is a left side central venous line unchanged.  Left basilar atelectasis is similar to prior.  Air space disease versus atelectasis in the right perihilar lung is unchanged.  No pneumothorax.  The chin does overlie the right apex.  IMPRESSION: . 1.  No evidence of complication following pacer placement. 2.  Stable exam with left basilar atelectasis and right perihilar atelectasis versus air space disease.   Original Report Authenticated By: Genevive Bi, M.D.   US Abdomen Complete  03/31/2013   *RADIOLOGY REPORT*  Clinical Data:  Nausea.   Personal history of breast carcinoma.  ABDOMINAL ULTRASOUND COMPLETE  Comparison:  None.  Findings:  Gallbladder:  Not visualized, presumably due to prior cholecystectomy.  Common Bile Duct:  Within normal limits in caliber. Measures 6-7 mm in diameter.  Liver: No focal mass lesion identified.  Within normal limits in parenchymal echogenicity.  IVC:  Appears normal.  Pancreas:  Suboptimal visualization due to overlying bowel gas.  Spleen:  Within normal limits in size and echotexture.  Right kidney:  Normal in size and parenchymal echogenicity.  No evidence of mass or hydronephrosis.  Left kidney:  Normal in size and parenchymal echogenicity. Parenchymal thinning, consistent with atrophy.  No evidence of mass or hydronephrosis.  3.8 cm cyst noted in the lower pole.  Abdominal Aorta:  No aneurysm identified.  IMPRESSION:  1.  Nonvisualization of gallbladder, presumably due to prior cholecystectomy.  No evidence of biliary dilatation. 2.  Left renal parenchymal atrophy and cyst. No evidence of hydronephrosis.   Original Report Authenticated By: Myles Rosenthal, M.D.   Dg Chest Port 1 View  03/29/2013   *  RADIOLOGY REPORT*  Clinical Data: Cardiac arrest  PORTABLE CHEST - 1 VIEW  Comparison: 03/27/2013  Findings: Left sided pacing pad in place.  Left IJ approach central venous catheter tip terminates over the proximal SVC.  Evidence of CABG and occlusion device.  Moderate enlargement of the cardiomediastinal silhouette is stable with central vascular congestion.  Small pleural effusions are noted with obscuration of the retrocardiac space.  Curvilinear bibasilar atelectasis again noted.  IMPRESSION: No significant change as above.   Original Report Authenticated By: Christiana Pellant, M.D.        FOLLOW UP PLANS AND APPOINTMENTS Discharge Orders   Future Appointments Provider Department Dept Phone   04/08/2013 2:30 PM [facility] [address] Main Office [place]   07/06/2013 9:30 AM Duke Salvia, MD [facility] Main Office [place]) [phone]   12/13/2013 11:00 AM Purcell Nails, MD [facility] [place] [phone]   Future Orders Complete By Expires     Diet - low sodium heart healthy  As directed     Discharge instructions  As directed     Comments:      Weigh daily. Wt gain of 3 lbs in 24 hours, 5 lbs in a week, please call Dr.Bensimhon's office. Avoid salt.    Increase activity slowly  As directed         Medication List    STOP taking these medications       torsemide 20 MG tablet  Commonly known as:  DEMADEX      TAKE these medications       furosemide 40 MG tablet  Commonly known as:  LASIX  Take 1 tablet (40 mg total) by mouth 2 (two) times daily.     hydrocodone-acetaminophen 7.5-650 MG per tablet  Commonly known as:  LORCET PLUS  Take 1 tablet by mouth every 6 (six) hours as needed for pain.     letrozole 2.5 MG tablet  Commonly known as:  FEMARA  Take 2.5 mg by mouth daily.     levothyroxine 137 MCG tablet  Commonly known as:  SYNTHROID, LEVOTHROID  Take 137 mcg by mouth daily.     lisinopril 5 MG tablet  Commonly known as:  PRINIVIL,ZESTRIL  Take 2.5 mg by mouth daily.     metFORMIN 1000 MG tablet  Commonly known as:  GLUCOPHAGE  Take 1,000 mg by mouth daily with supper.     metolazone 2.5 MG tablet  Commonly known as:  ZAROXOLYN  Take 2.5 mg by mouth daily as needed (Hold if weight drops below 138lbs).     metoprolol tartrate 25 MG tablet  Commonly known as:  LOPRESSOR  Take 1 tablet (25 mg total) by mouth 2 (two) times daily.     pantoprazole 40 MG tablet  Commonly known as:  PROTONIX  Take 40 mg by mouth every evening.     potassium chloride SA 20 MEQ tablet  Commonly known as:  K-DUR,KLOR-CON  Take 2 tablets (40 mEq total) by mouth daily.     rosuvastatin 5 MG tablet  Commonly known as:  CRESTOR  Take 5 mg by mouth daily.     simethicone 80 MG chewable tablet  Commonly known as:   MYLICON  Chew 2 tablets (160 mg total) by mouth 4 (four) times daily as needed for flatulence (prn distention and "gas").     TYLENOL PO  Take 2 tablets by mouth every 12 (twelve) hours as needed (pain).  VITAMIN B1-B12 IJ  Inject 1 mL into the skin every 30 (thirty) days.     zolpidem 5 MG tablet  Commonly known as:  AMBIEN  Take 5 mg by mouth at bedtime.           Follow-up Information   Follow up with [facility] On 04/08/2013. (At 2:30 PM for wound check)    Contact information:   [address] [place] [address] [phone]      Follow up with Sherryl Manges, MD On 07/06/2013. (At 9:30 AM)    Contact information:   [address] [place] [address] [phone]      Follow up with Arvilla Meres, MD. (Our office will call you for appt to be scheduled in 2-3 weeks if one not already scheduled.)    Contact information:   [address] [place] [address] [phone]         Time spent with patient to include physician time: 50 minutes Signed: Joni Reining 04/04/2013, 9:49 AM Co-Sign MD

## 2013-04-05 ENCOUNTER — Other Ambulatory Visit (HOSPITAL_COMMUNITY): Payer: Self-pay | Admitting: Adult Health

## 2013-04-07 ENCOUNTER — Other Ambulatory Visit (HOSPITAL_COMMUNITY): Payer: Self-pay | Admitting: *Deleted

## 2013-04-07 MED ORDER — METOLAZONE 2.5 MG PO TABS: 2.5000 mg | ORAL_TABLET | Freq: Every day | ORAL | Status: DC | PRN

## 2013-04-08 ENCOUNTER — Encounter: Payer: Self-pay | Admitting: Internal Medicine

## 2013-04-08 ENCOUNTER — Ambulatory Visit (INDEPENDENT_AMBULATORY_CARE_PROVIDER_SITE_OTHER): Payer: Medicare Other | Admitting: *Deleted

## 2013-04-08 DIAGNOSIS — I469 Cardiac arrest, cause unspecified: Secondary | ICD-10-CM

## 2013-04-08 DIAGNOSIS — I4891 Unspecified atrial fibrillation: Secondary | ICD-10-CM

## 2013-04-08 LAB — ICD DEVICE OBSERVATION
AL AMPLITUDE: 1.5 mv
AL THRESHOLD: 0.75 V
DEV-0020ICD: NEGATIVE
HV IMPEDENCE: 60 Ohm
RV LEAD AMPLITUDE: 5.4 mv
RV LEAD THRESHOLD: 0.5 V

## 2013-04-08 NOTE — Progress Notes (Signed)
Wound check defib in clinic  

## 2013-04-09 ENCOUNTER — Telehealth (HOSPITAL_COMMUNITY): Payer: Self-pay | Admitting: Adult Health

## 2013-04-09 NOTE — Telephone Encounter (Signed)
Spoke with her daughter regarding HH orders. She is concerned because she does not have an order for BMET.   I contacted  [facility]  [phone] and ordered BMET to be obtained Monday  faxed to HF clinic.   Allexus Ovens 4:05 PM

## 2013-05-06 ENCOUNTER — Encounter (HOSPITAL_COMMUNITY): Payer: Self-pay

## 2013-05-06 ENCOUNTER — Ambulatory Visit (HOSPITAL_COMMUNITY)
Admission: RE | Admit: 2013-05-06 | Discharge: 2013-05-06 | Disposition: A | Discharge status: Home / Self Care | Payer: Medicare Other | Source: Ambulatory Visit | Attending: Internal Medicine | Admitting: Internal Medicine

## 2013-05-06 VITALS — BP 130/64 | HR 77 | Wt 137.8 lb

## 2013-05-06 DIAGNOSIS — Z923 Personal history of irradiation: Secondary | ICD-10-CM | POA: Insufficient documentation

## 2013-05-06 DIAGNOSIS — Z952 Presence of prosthetic heart valve: Secondary | ICD-10-CM | POA: Insufficient documentation

## 2013-05-06 DIAGNOSIS — I5032 Chronic diastolic (congestive) heart failure: Secondary | ICD-10-CM

## 2013-05-06 DIAGNOSIS — Z8679 Personal history of other diseases of the circulatory system: Secondary | ICD-10-CM

## 2013-05-06 DIAGNOSIS — Z853 Personal history of malignant neoplasm of breast: Secondary | ICD-10-CM | POA: Insufficient documentation

## 2013-05-06 DIAGNOSIS — Z9889 Other specified postprocedural states: Secondary | ICD-10-CM

## 2013-05-06 DIAGNOSIS — I2789 Other specified pulmonary heart diseases: Secondary | ICD-10-CM

## 2013-05-06 DIAGNOSIS — E119 Type 2 diabetes mellitus without complications: Secondary | ICD-10-CM | POA: Insufficient documentation

## 2013-05-06 DIAGNOSIS — I469 Cardiac arrest, cause unspecified: Secondary | ICD-10-CM

## 2013-05-06 DIAGNOSIS — Z9010 Acquired absence of unspecified breast and nipple: Secondary | ICD-10-CM | POA: Insufficient documentation

## 2013-05-06 NOTE — Patient Instructions (Addendum)
Follow up in 6 weeks  Goal for weight 134-137 pounds  Do the following things EVERYDAY: 1) Weigh yourself in the morning before breakfast. Write it down and keep it in a log. 2) Take your medicines as prescribed 3) Eat low salt foods-Limit salt (sodium) to 2000 mg per day.  4) Stay as active as you can everyday 5) Limit all fluids for the day to less than 2 liters

## 2013-05-06 NOTE — Assessment & Plan Note (Signed)
Stable. Unable to tolerate sildenafil in past.

## 2013-05-06 NOTE — Assessment & Plan Note (Signed)
Remains in NSR. Not coumadin candidate due to recurrent falls.

## 2013-05-06 NOTE — Assessment & Plan Note (Addendum)
Overall she is doing surprisingly well. Volume status looks good. I reinforced need for daily weights and reviewed use of sliding scale diuretic with a dry weight in the 135 range. We have previously discussed the possibility of repeat TV repair but I do not think she would tolerate this well. Check labs through [facility] this week.

## 2013-05-06 NOTE — Assessment & Plan Note (Signed)
She is s/p ICD. No further events. Watch electrolytes.

## 2013-05-06 NOTE — Progress Notes (Signed)
Patient ID: [name], female   DOB: [birth year], 72 y.o.   MRN: [id number] PCP: Dr Darius Bump EP: Dr Ladona Ridgel  HPI: 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 Status post bioprosthetic mitral valve replacement, tricuspid valve repair, and Maze procedure on September 24, 2011.(pre-op cath minimal CAD) Coumadin stopped due to falls. CRI (Cr = 1.3) . 03/31/13 S/P DDD ICD.   Has not been able to tolerate Revatio in past.  08/12/12 RHC RA = 15 v= 21  RV = 64/3/13  PA = 59/16 (33)  PCW = 14  Fick cardiac output/index = 4.1/2.4  PVR = 7.8 Woods  FA sat = 98%  PA sat = 60%, 63%   02/17/13 Echo  RV dilated but normal systolic function.  LVEF 50%. Moderate to severe TR  Septal bounce RVSP 55-60. IVC smalll  03/27/13 Echo  EF 55-60%. Moderate to severe TR RVSP  46  Admitted to [facility] 03/26/13 through 04/04/24  had V fib arrest in setting of hypokalemia. Then had multiple episodes or recurrent VT despite correction of electrolyte abnormalities. Seen by Dr. Graciela Husbands and received ICD.   She returns for post hospital follow up with her daughter and husband. Overall she feels good. Completed PT yesterday. No ICD firing or events on interrogation, Weight down to 134 pounds at home. Compliant with medications. No orthopnea or PND  Followed by [facility]. [phone] Fax [phone])  ROS: All systems negative except as listed in HPI, PMH and Problem List.  Past Medical History  Diagnosis Date  . Pulmonary hypertension     s/p RHC 4/10 - PAP 86/29, wedge 21-23  . Gastric ulcer with hemorrhage   . Rheumatic fever     Childhood  . Coronary artery disease     Nonobstructive, LVEF 55%  . Mitral regurgitation     Moderate  . 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 Mitral stenosis with insufficiency, rheumatic   .  Hypertension   . Heart murmur   . Stroke     12/11 [place],LEFT SIDE WEAKNESS  . Shortness of breath     EXERTION,PT. STATES SHE HAS PULMONARY HTN  . Pneumonia 1990'S     SEVERAL EPISODES  . Asthma     PT. STATES MILD,USES INHALERS PRN,  . Asthma     PT. ON OXYGEN 2 L/[place]   . Diabetes mellitus   . Hypothyroidism   . Anemia, pernicious 1979  . Hepatitis     PT. STATES SHE HAD IT WHEN SHE WAS 14 YRS. OLD  . Blood transfusion 2008  . Arthritis   . GERD (gastroesophageal reflux disease)   . Fibromyalgia   . Squamous cell carcinoma     Left arm  . Breast cancer     2008 s/p chemo and radiation (last tx 4/09) s/p left mastectomy s/p 9 lymph node resection - [name], [facility]  . CHF (congestive heart failure) 2010    Current Outpatient Prescriptions  Medication Sig Dispense Refill  . Acetaminophen (TYLENOL PO) Take 2 tablets by mouth every 12 (twelve) hours as needed (pain).      . folic acid (FOLVITE) 1 MG tablet Take 1 mg by mouth 2 (two) times daily.      . furosemide (LASIX) 40 MG tablet Take 1 tablet (40 mg total) by mouth 2 (  two) times daily.  60 tablet  3  . hydrocodone-acetaminophen (LORCET PLUS) 7.5-650 MG per tablet Take 1 tablet by mouth every 6 (six) hours as needed for pain.      Marland Kitchen letrozole (FEMARA) 2.5 MG tablet Take 2.5 mg by mouth daily.      Marland Kitchen levothyroxine (SYNTHROID, LEVOTHROID) 137 MCG tablet Take 137 mcg by mouth daily.       Marland Kitchen lisinopril (PRINIVIL,ZESTRIL) 5 MG tablet Take 2.5 mg by mouth daily.      . metFORMIN (GLUCOPHAGE) 1000 MG tablet Take 1,000 mg by mouth daily with supper.      . metolazone (ZAROXOLYN) 2.5 MG tablet TAKE 1 TABLET (2.5 MG TOTAL) BY MOUTH AS NEEDED.  10 tablet  6  . metoprolol tartrate (LOPRESSOR) 25 MG tablet Take 1 tablet (25 mg total) by mouth 2 (two) times daily.  60 tablet  10  . pantoprazole (PROTONIX) 40 MG tablet Take 40 mg by mouth every evening.      . potassium chloride SA (K-DUR,KLOR-CON) 20 MEQ tablet Take 2  tablets (40 mEq total) by mouth daily.  60 tablet  10  . rosuvastatin (CRESTOR) 5 MG tablet Take 5 mg by mouth daily.      . simethicone (MYLICON) 80 MG chewable tablet Chew 2 tablets (160 mg total) by mouth 4 (four) times daily as needed for flatulence (prn distention and "gas").  30 tablet  1  . VITAMIN B1-B12 IJ Inject 1 mL into the skin every 30 (thirty) days.      Marland Kitchen zolpidem (AMBIEN) 5 MG tablet Take 5 mg by mouth at bedtime.        No current facility-administered medications for this encounter.     PHYSICAL EXAM: Filed Vitals:   05/06/13 1421  BP: 130/64  Pulse: 77  Weight: 137 lb 12.8 oz (62.506 kg)  SpO2: 100%    General:  Chronically ill appearing.  No acute distress.  No resp difficulty with her husband and daughter.   HEENT: normal Neck: supple. JVP to ear with +CV waves. Carotids 2+ bilaterally; no bruits. No lymphadenopathy or thryomegaly appreciated. Cor: PMI normal. regular rate & rhythm. No rubs, gallops or 3/6 TR. soft P2. 2/6 AS murmur Lungs:  clear Abdomen: soft, obese,  nontender, mildly distended.No bruits or masses. Good bowel sounds. Extremities: R and L fingers cyanotic. No clubbing, rash, tr-1+  LE edema.  Neuro: alert & orientedx3, cranial nerves grossly intact. Moves all 4 extremities w/o difficulty. Affect pleasant.     ASSESSMENT & PLAN:

## 2013-05-13 ENCOUNTER — Encounter: Payer: Self-pay | Admitting: Internal Medicine

## 2013-06-17 ENCOUNTER — Encounter (HOSPITAL_COMMUNITY): Payer: Self-pay

## 2013-06-17 ENCOUNTER — Ambulatory Visit (HOSPITAL_COMMUNITY)
Admission: RE | Admit: 2013-06-17 | Discharge: 2013-06-17 | Disposition: A | Discharge status: Home / Self Care | Payer: Medicare Other | Source: Ambulatory Visit | Attending: Internal Medicine | Admitting: Internal Medicine

## 2013-06-17 VITALS — BP 100/60 | HR 69 | Wt 145.4 lb

## 2013-06-17 DIAGNOSIS — I2789 Other specified pulmonary heart diseases: Secondary | ICD-10-CM | POA: Insufficient documentation

## 2013-06-17 DIAGNOSIS — I4891 Unspecified atrial fibrillation: Secondary | ICD-10-CM | POA: Insufficient documentation

## 2013-06-17 DIAGNOSIS — I079 Rheumatic tricuspid valve disease, unspecified: Secondary | ICD-10-CM | POA: Insufficient documentation

## 2013-06-17 DIAGNOSIS — Z952 Presence of prosthetic heart valve: Secondary | ICD-10-CM

## 2013-06-17 DIAGNOSIS — Z954 Presence of other heart-valve replacement: Secondary | ICD-10-CM

## 2013-06-17 DIAGNOSIS — I5032 Chronic diastolic (congestive) heart failure: Secondary | ICD-10-CM | POA: Insufficient documentation

## 2013-06-17 DIAGNOSIS — I071 Rheumatic tricuspid insufficiency: Secondary | ICD-10-CM

## 2013-06-17 MED ORDER — TORSEMIDE 20 MG PO TABS: 60.0000 mg | ORAL_TABLET | Freq: Every day | ORAL | Status: DC

## 2013-06-17 MED ORDER — SPIRONOLACTONE 25 MG PO TABS: 12.5000 mg | ORAL_TABLET | Freq: Every day | ORAL | Status: DC

## 2013-06-17 NOTE — Patient Instructions (Addendum)
Start taking Spironolactone 12.5 mg daily.  Stop taking lasix.  Start taking torsemide 60 mg daily.  Check BMET next week with PCP.  Follow up 3 weeks.

## 2013-06-17 NOTE — Progress Notes (Deleted)
Patient ID: [name], female   DOB: April 19, 1941, 72 y.o.   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 Status post bioprosthetic mitral valve replacement, tricuspid valve repair, and Maze procedure on September 24, 2011.(pre-op cath minimal CAD) Coumadin stopped due to falls. CRI (Cr = 1.3) . 03/31/13 S/P DDD ICD.   Has not been able to tolerate Revatio in past.  08/12/12 RHC RA = 15 v= 21  RV = 64/3/13  PA = 59/16 (33)  PCW = 14  Fick cardiac output/index = 4.1/2.4  PVR = 7.8 Woods  FA sat = 98%  PA sat = 60%, 63%   02/17/13 Echo  RV dilated but normal systolic function.  LVEF 50%. Moderate to severe TR  Septal bounce RVSP 55-60. IVC small  03/27/13 Echo  EF 55-60%. Moderate to severe TR RVSP  46  Admitted to [facility] 03/26/13 through 04/04/24  had V fib arrest in setting of hypokalemia. Then had multiple episodes or recurrent VT despite correction of electrolyte abnormalities. Seen by Dr. Graciela Husbands and received ICD.   Labs (7/14): Creatinine 1.15, K+4.0  Follow up: Feels fatigue. Weight at home 140-144. Has been taking an extra lasix pill for 3 weeks now (total 120 mg). Stopped taking K+ for 3 days d/t N/V/diarrhea. +orthopnea 4-5 pillows. SOB with minimal exertion. No longer followed by [facility]. Denies any palpitations.   ROS: All systems negative except as listed in HPI, PMH and Problem List.  Past Medical History  Diagnosis Date  . Pulmonary hypertension     s/p RHC 4/10 - PAP 86/29, wedge 21-23  . Gastric ulcer with hemorrhage   . Rheumatic fever     Childhood  . Coronary artery disease     Nonobstructive, LVEF 55%  . Mitral regurgitation     Moderate  . 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 Mitral stenosis with insufficiency, rheumatic   . Hypertension   .  Heart murmur   . Stroke     12/11 [name],LEFT SIDE WEAKNESS  . Shortness of breath     EXERTION,PT. STATES SHE HAS PULMONARY HTN  . Pneumonia 1990'S     SEVERAL EPISODES  . Asthma     PT. STATES MILD,USES INHALERS PRN,  . Asthma     PT. ON OXYGEN 2 L/[name]   . Diabetes mellitus   . Hypothyroidism   . Anemia, pernicious 1979  . Hepatitis     PT. STATES SHE HAD IT WHEN SHE WAS 14 YRS. OLD  . Blood transfusion 2008  . Arthritis   . GERD (gastroesophageal reflux disease)   . Fibromyalgia   . Squamous cell carcinoma     Left arm  . Breast cancer     2008 s/p chemo and radiation (last tx 4/09) s/p left mastectomy s/p 9 lymph node resection - [name], [name]  . CHF (congestive heart failure) 2010    Current Outpatient Prescriptions  Medication Sig Dispense Refill  . Acetaminophen (TYLENOL PO) Take 2 tablets by mouth every 12 (twelve) hours as needed (pain).      . folic acid (FOLVITE) 1 MG tablet Take 1 mg by mouth 2 (two) times daily.      . furosemide (LASIX) 40 MG tablet Take 1 tablet (40 mg total) by  mouth 2 (two) times daily.  60 tablet  3  . hydrocodone-acetaminophen (LORCET PLUS) 7.5-650 MG per tablet Take 1 tablet by mouth every 6 (six) hours as needed for pain.      Marland Kitchen letrozole (FEMARA) 2.5 MG tablet Take 2.5 mg by mouth daily.      Marland Kitchen levothyroxine (SYNTHROID, LEVOTHROID) 137 MCG tablet Take 137 mcg by mouth daily.       Marland Kitchen lisinopril (PRINIVIL,ZESTRIL) 5 MG tablet Take 2.5 mg by mouth daily.      . metFORMIN (GLUCOPHAGE) 1000 MG tablet Take 1,000 mg by mouth daily with supper.      . metolazone (ZAROXOLYN) 2.5 MG tablet TAKE 1 TABLET (2.5 MG TOTAL) BY MOUTH AS NEEDED.  10 tablet  6  . metoprolol tartrate (LOPRESSOR) 25 MG tablet Take 1 tablet (25 mg total) by mouth 2 (two) times daily.  60 tablet  10  . pantoprazole (PROTONIX) 40 MG tablet Take 40 mg by mouth every evening.      . potassium chloride SA (K-DUR,KLOR-CON) 20 MEQ tablet Take 2 tablets (40 mEq total)  by mouth daily.  60 tablet  10  . rosuvastatin (CRESTOR) 5 MG tablet Take 5 mg by mouth daily.      . simethicone (MYLICON) 80 MG chewable tablet Chew 2 tablets (160 mg total) by mouth 4 (four) times daily as needed for flatulence (prn distention and "gas").  30 tablet  1  . VITAMIN B1-B12 IJ Inject 1 mL into the skin every 30 (thirty) days.      Marland Kitchen zolpidem (AMBIEN) 5 MG tablet Take 5 mg by mouth at bedtime.        No current facility-administered medications for this encounter.   PHYSICAL EXAM: Filed Vitals:   06/17/13 1338  BP: 100/60  Pulse: 69  Weight: 145 lb 6.4 oz (65.953 kg)  SpO2: 98%    General:  Chronically ill appearing.  No acute distress.No resp difficulty; daughter present.  HEENT: normal Neck: supple. JVP to ear with +CV waves. Carotids 2+ bilaterally; no bruits. No lymphadenopathy or thryomegaly appreciated. Cor: PMI normal. regular rate & rhythm. No rubs, gallops or 3/6 TR. soft P2. 2/6 AS murmur Lungs:  clear Abdomen: soft, obese,  nontender, mildly distended.No bruits or masses. Good bowel sounds. Extremities: R and L fingers cyanotic. No clubbing, 3+ LE edema Neuro: alert & orientedx3, cranial nerves grossly intact. Moves all 4 extremities w/o difficulty. Affect pleasant.   ASSESSMENT & PLAN:  1) Chronic diastolic HF, EF 55-60% - NYHA III/IIIb symptoms and volume status markedly elevated. Up 8 lbs since last visit. - Will switch to torsemide 60 mg daily. - Will get BMET today   2) Hypokalemia - Previous hx of hypokalemia that led to VF arrest in the spring. With increase of torsemide will start Spiro 12.5 mg daily. Will check BMET today and then will recheck next week with PCP.  Ulla Potash B NP-C 6:03 PM

## 2013-06-17 NOTE — Progress Notes (Signed)
Patient ID: ARIENNA BENEGAS, female   DOB: [birth year], 72 y.o.   MRN: [id number] PCP: Dr Darius Bump EP: Dr Ladona Ridgel  HPI: Ms. 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 Status post bioprosthetic mitral valve replacement, tricuspid valve repair, and Maze procedure on September 24, 2011.(pre-op cath minimal CAD) Coumadin stopped due to falls. CRI (Cr = 1.3) . 03/31/13 S/P DDD ICD.   Has not been able to tolerate Revatio in past.  08/12/12 RHC RA = 15 v= 21  RV = 64/3/13  PA = 59/16 (33)  PCW = 14  Fick cardiac output/index = 4.1/2.4  PVR = 7.8 Woods  FA sat = 98%  PA sat = 60%, 63%   02/17/13 Echo  RV dilated but normal systolic function.  LVEF 50%. Moderate to severe TR  Septal bounce RVSP 55-60. IVC smalll  03/27/13 Echo  EF 55-60%. Moderate to severe TR RVSP  46  Admitted to [facility] 03/26/13 through 04/04/24  had V fib arrest in setting of hypokalemia. Then had multiple episodes or recurrent VT despite correction of electrolyte abnormalities. Seen by Dr. Graciela Husbands and received ICD.   Labs (7/14): Creatinine 1.15, K+4.0  Follow up: Feels fatigue. Weight at home up 7 pounds now 140-144. Has been taking an extra lasix pill for 3 weeks now (total 120 mg). Stopped taking K+ for 3 days d/t N/V/diarrhea. +orthopnea 4-5 pillows. SOB with minimal exertion. No longer followed by [facility]. Denies any palpitations.   ROS: All systems negative except as listed in HPI, PMH and Problem List.  Past Medical History  Diagnosis Date  . Pulmonary hypertension     s/p RHC 4/10 - PAP 86/29, wedge 21-23  . Gastric ulcer with hemorrhage   . Rheumatic fever     Childhood  . Coronary artery disease     Nonobstructive, LVEF 55%  . Mitral regurgitation     Moderate  . 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 Mitral stenosis with insufficiency, rheumatic   .  Hypertension   . Heart murmur   . Stroke     12/11 [place],LEFT SIDE WEAKNESS  . Shortness of breath     EXERTION,PT. STATES SHE HAS PULMONARY HTN  . Pneumonia 1990'S     SEVERAL EPISODES  . Asthma     PT. STATES MILD,USES INHALERS PRN,  . Asthma     PT. ON OXYGEN 2 L/[place]   . Diabetes mellitus   . Hypothyroidism   . Anemia, pernicious 1979  . Hepatitis     PT. STATES SHE HAD IT WHEN SHE WAS 14 YRS. OLD  . Blood transfusion 2008  . Arthritis   . GERD (gastroesophageal reflux disease)   . Fibromyalgia   . Squamous cell carcinoma     Left arm  . Breast cancer     2008 s/p chemo and radiation (last tx 4/09) s/p left mastectomy s/p 9 lymph node resection - [name], [facility]  . CHF (congestive heart failure) 2010    Current Outpatient Prescriptions  Medication Sig Dispense Refill  . Acetaminophen (TYLENOL PO) Take 2 tablets by mouth every 12 (twelve) hours as needed (pain).      . folic acid (FOLVITE) 1 MG tablet Take 1 mg by mouth 2 (two) times daily.      . furosemide (LASIX) 40 MG tablet Take 1 tablet (  40 mg total) by mouth 2 (two) times daily.  60 tablet  3  . hydrocodone-acetaminophen (LORCET PLUS) 7.5-650 MG per tablet Take 1 tablet by mouth every 6 (six) hours as needed for pain.      Marland Kitchen letrozole (FEMARA) 2.5 MG tablet Take 2.5 mg by mouth daily.      Marland Kitchen levothyroxine (SYNTHROID, LEVOTHROID) 137 MCG tablet Take 137 mcg by mouth daily.       Marland Kitchen lisinopril (PRINIVIL,ZESTRIL) 5 MG tablet Take 2.5 mg by mouth daily.      . metFORMIN (GLUCOPHAGE) 1000 MG tablet Take 1,000 mg by mouth daily with supper.      . metolazone (ZAROXOLYN) 2.5 MG tablet TAKE 1 TABLET (2.5 MG TOTAL) BY MOUTH AS NEEDED.  10 tablet  6  . metoprolol tartrate (LOPRESSOR) 25 MG tablet Take 1 tablet (25 mg total) by mouth 2 (two) times daily.  60 tablet  10  . pantoprazole (PROTONIX) 40 MG tablet Take 40 mg by mouth every evening.      . potassium chloride SA (K-DUR,KLOR-CON) 20 MEQ tablet Take 2  tablets (40 mEq total) by mouth daily.  60 tablet  10  . rosuvastatin (CRESTOR) 5 MG tablet Take 5 mg by mouth daily.      . simethicone (MYLICON) 80 MG chewable tablet Chew 2 tablets (160 mg total) by mouth 4 (four) times daily as needed for flatulence (prn distention and "gas").  30 tablet  1  . VITAMIN B1-B12 IJ Inject 1 mL into the skin every 30 (thirty) days.      Marland Kitchen zolpidem (AMBIEN) 5 MG tablet Take 5 mg by mouth at bedtime.        No current facility-administered medications for this encounter.   PHYSICAL EXAM: Filed Vitals:   06/17/13 1338  BP: 100/60  Pulse: 69  Weight: 145 lb 6.4 oz (65.953 kg)  SpO2: 98%    General:  Chronically ill appearing.  No acute distress.No resp difficulty; daughter present.  HEENT: normal Neck: supple. JVP to ear with +CV waves. Carotids 2+ bilaterally; no bruits. No lymphadenopathy or thryomegaly appreciated. Cor: PMI normal. regular rate & rhythm. No rubs, gallops or 3/6 TR. soft P2. 2/6 AS murmur Lungs:  clear Abdomen: soft, obese,  nontender, mildly distended.No bruits or masses. Good bowel sounds. Extremities: R and L fingers cyanotic. No clubbing, 3+ LE edema Neuro: alert & orientedx3, cranial nerves grossly intact. Moves all 4 extremities w/o difficulty. Affect pleasant.   ASSESSMENT & PLAN:  1) Chronic diastolic HF, EF 55-60% (R>L) - NYHA III/IIIb symptoms and volume status markedly elevated. Up 8 lbs since last visit. - Will switch to torsemide 60 mg daily. - Start spironolactone 12.5 mg daily. - Check BMET next week with PCP. 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 Cornelius Moras)  4) AF, s/p Maze        -off coumadin due to  falls  5) H/o CVA  6) H/o VT storm in setting of hypokalemia      --s/p ICD   Truman Hayward 5:53 PM

## 2013-06-22 NOTE — Addendum Note (Signed)
Encounter addended by: Aundria Rud, NP on: 06/22/2013  8:18 AM<BR>     Documentation filed: Notes Section

## 2013-07-05 ENCOUNTER — Encounter: Payer: Self-pay | Admitting: Internal Medicine

## 2013-07-05 ENCOUNTER — Other Ambulatory Visit: Payer: Self-pay | Admitting: Internal Medicine

## 2013-07-05 ENCOUNTER — Ambulatory Visit (INDEPENDENT_AMBULATORY_CARE_PROVIDER_SITE_OTHER): Payer: Medicare Other | Admitting: Internal Medicine

## 2013-07-05 VITALS — BP 121/61 | HR 69 | Ht 61.0 in | Wt 144.0 lb

## 2013-07-05 DIAGNOSIS — I4891 Unspecified atrial fibrillation: Secondary | ICD-10-CM

## 2013-07-05 DIAGNOSIS — I5032 Chronic diastolic (congestive) heart failure: Secondary | ICD-10-CM

## 2013-07-05 DIAGNOSIS — I5042 Chronic combined systolic (congestive) and diastolic (congestive) heart failure: Secondary | ICD-10-CM

## 2013-07-05 DIAGNOSIS — I4581 Long QT syndrome: Secondary | ICD-10-CM

## 2013-07-05 DIAGNOSIS — I469 Cardiac arrest, cause unspecified: Secondary | ICD-10-CM

## 2013-07-05 LAB — ICD DEVICE OBSERVATION
AL AMPLITUDE: 1.4 mv
ATRIAL PACING ICD: 72.2 pct
HV IMPEDENCE: 59 Ohm
RV LEAD AMPLITUDE: 6.1 mv
RV LEAD IMPEDENCE ICD: 399 Ohm
VENTRICULAR PACING ICD: 72.9 pct

## 2013-07-05 NOTE — Patient Instructions (Signed)
Continue all current medications. Allred - Your physician wants you to follow up in: 6 months.  You will receive a reminder letter in the mail one-two months in advance.  If you don't receive a letter, please call our office to schedule the follow up appointment

## 2013-07-05 NOTE — Progress Notes (Signed)
PCP: Alton Revere, MD Primary Cardiologist:  Diona Browner Also followed by Dr Steward Ros is a 72 y.o. female who presents today for routine electrophysiology followup.  Since her ICD implantation, the patient reports doing reasonably well.  She has had difficulty with swelling/ SOB for which she has been diuresed by Dr Gala Romney.  She reports that her weight is improving. Today, she denies symptoms of palpitations, chest pain, shortness of breath, dizziness, presyncope, syncope, or ICD shocks.  The patient is otherwise without complaint today.   Past Medical History  Diagnosis Date  . Pulmonary hypertension     s/p RHC 4/10 - PAP 86/29, wedge 21-23  . Gastric ulcer with hemorrhage   . Rheumatic fever     Childhood  . Coronary artery disease     Nonobstructive, LVEF 55%  . Mitral regurgitation     Moderate  . 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 Mitral stenosis with insufficiency, rheumatic   . Hypertension   . Heart murmur   . Stroke     12/11 [place],LEFT SIDE WEAKNESS  . Shortness of breath     EXERTION,PT. STATES SHE HAS PULMONARY HTN  . Pneumonia 1990'S     SEVERAL EPISODES  . Asthma     PT. STATES MILD,USES INHALERS PRN,  . Asthma     PT. ON OXYGEN 2 L/[place]   . Diabetes mellitus   . Hypothyroidism   . Anemia, pernicious 1979  . Hepatitis     PT. STATES SHE HAD IT WHEN SHE WAS 14 YRS. OLD  . Blood transfusion 2008  . Arthritis   . GERD (gastroesophageal reflux disease)   . Fibromyalgia   . Squamous cell carcinoma     Left arm  . Breast cancer     2008 s/p chemo and radiation (last tx 4/09) s/p left mastectomy s/p 9 lymph node resection - [name], [facility]  . CHF (congestive heart failure) 2010   Past Surgical History  Procedure Laterality Date  . Left mastectomy    . Partial gastrectomy    . Tonsillectomy    . Cardiac catheterization  07/2011  . Mastectomy  2008    LEFT  . Maze  09/24/2011    Procedure: MAZE (complete  biatrial lesion set using RF and cryo with clipping of LA appendage);  Surgeon: Purcell Nails, MD;  Location[facility] OR;  Service: Open Heart Surgery;  Laterality: N/A;  . Mitral valve replacement  09/24/2011    Procedure: MITRAL VALVE (MV) REPLACEMENT (#29mm Medtronic Mosaic bioprosthetic tissue valve);  Surgeon: Purcell Nails, MD;  Location: [facility] OR;  Service: Open Heart Surgery;  Laterality: N/A;  . Tricuspid valvuloplasty  09/24/2011    #30mm Edwards mc3 ring annuloplasty  . Cardiac defibrillator placement  03/31/13    MDT Lianne Moris DR implanted by Dr Ladona Ridgel for secondary prevention    Current Outpatient Prescriptions  Medication Sig Dispense Refill  . Acetaminophen (TYLENOL PO) Take 2 tablets by mouth every 12 (twelve) hours as needed (pain).      . folic acid (FOLVITE) 1 MG tablet Take 1 mg by mouth 2 (two) times daily.      . hydrocodone-acetaminophen (LORCET PLUS) 7.5-650 MG per tablet Take 1 tablet by mouth every 6 (six) hours as needed for pain.      Marland Kitchen letrozole (FEMARA) 2.5 MG tablet Take 2.5 mg by mouth daily.      Marland Kitchen levothyroxine (SYNTHROID, LEVOTHROID) 137 MCG tablet Take 137 mcg  by mouth daily.       Marland Kitchen lisinopril (PRINIVIL,ZESTRIL) 5 MG tablet Take 2.5 mg by mouth daily.      . metFORMIN (GLUCOPHAGE) 1000 MG tablet Take 1,000 mg by mouth daily with supper.      . metolazone (ZAROXOLYN) 2.5 MG tablet TAKE 1 TABLET (2.5 MG TOTAL) BY MOUTH AS NEEDED.  10 tablet  6  . metoprolol tartrate (LOPRESSOR) 25 MG tablet Take 1 tablet (25 mg total) by mouth 2 (two) times daily.  60 tablet  10  . mirtazapine (REMERON) 15 MG tablet Take 15 mg by mouth at bedtime.       . pantoprazole (PROTONIX) 40 MG tablet Take 40 mg by mouth every evening.      . potassium chloride SA (K-DUR,KLOR-CON) 20 MEQ tablet Take 2 tablets (40 mEq total) by mouth daily.  60 tablet  10  . rosuvastatin (CRESTOR) 5 MG tablet Take 5 mg by mouth daily.      . simethicone (MYLICON) 80 MG chewable tablet Chew 2 tablets (160 mg  total) by mouth 4 (four) times daily as needed for flatulence (prn distention and "gas").  30 tablet  1  . spironolactone (ALDACTONE) 25 MG tablet Take 0.5 tablets (12.5 mg total) by mouth daily.  30 tablet  6  . torsemide (DEMADEX) 20 MG tablet Take 3 tablets (60 mg total) by mouth daily.  90 tablet  6  . VITAMIN B1-B12 IJ Inject 1 mL into the skin every 30 (thirty) days.      Marland Kitchen zolpidem (AMBIEN) 5 MG tablet Take 5 mg by mouth at bedtime.        No current facility-administered medications for this visit.    Physical Exam: Filed Vitals:   07/05/13 1545  BP: 121/61  Pulse: 69  Height: 5\' 1"  (1.549 m)  Weight: 144 lb (65.318 kg)    GEN- The patient is well appearing, alert and oriented x 3 today.   Head- normocephalic, atraumatic Eyes-  Sclera clear, conjunctiva pink Ears- hearing intact Oropharynx- clear Lungs- Clear to ausculation bilaterally, normal work of breathing Chest- ICD pocket is well healed Heart- Regular rate and rhythm, no murmurs, rubs or gallops, PMI not laterally displaced GI- soft, NT, ND, + BS Extremities- no clubbing, cyanosis, +1 edema  ICD interrogation- reviewed in detail today,  See PACEART report  Assessment and Plan:  1.  Chronic systolic dysfunction euvolemic today Stable on an appropriate medical regimen  2. 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

## 2013-07-06 ENCOUNTER — Encounter: Payer: Medicare Other | Admitting: Internal Medicine

## 2013-07-09 ENCOUNTER — Encounter (HOSPITAL_COMMUNITY): Payer: Medicare Other

## 2013-08-10 ENCOUNTER — Ambulatory Visit (HOSPITAL_COMMUNITY): Payer: Medicare Other | Attending: Cardiology

## 2013-08-13 ENCOUNTER — Telehealth: Payer: Self-pay | Admitting: Internal Medicine

## 2013-08-13 NOTE — Telephone Encounter (Signed)
New problem    Mother/ daughter is checking on status of  Results  genetic testing.

## 2013-08-17 ENCOUNTER — Encounter: Payer: Self-pay | Admitting: Internal Medicine

## 2013-09-28 ENCOUNTER — Ambulatory Visit (HOSPITAL_COMMUNITY)
Admission: RE | Admit: 2013-09-28 | Discharge: 2013-09-28 | Disposition: A | Discharge status: Home / Self Care | Payer: Medicare Other | Source: Ambulatory Visit | Attending: Internal Medicine | Admitting: Internal Medicine

## 2013-09-28 ENCOUNTER — Encounter (HOSPITAL_COMMUNITY): Payer: Self-pay

## 2013-09-28 VITALS — BP 126/84 | HR 83 | Wt 155.4 lb

## 2013-09-28 DIAGNOSIS — I509 Heart failure, unspecified: Secondary | ICD-10-CM | POA: Insufficient documentation

## 2013-09-28 DIAGNOSIS — I099 Rheumatic heart disease, unspecified: Secondary | ICD-10-CM | POA: Insufficient documentation

## 2013-09-28 DIAGNOSIS — I4891 Unspecified atrial fibrillation: Secondary | ICD-10-CM

## 2013-09-28 DIAGNOSIS — Z8673 Personal history of transient ischemic attack (TIA), and cerebral infarction without residual deficits: Secondary | ICD-10-CM | POA: Insufficient documentation

## 2013-09-28 DIAGNOSIS — I5032 Chronic diastolic (congestive) heart failure: Secondary | ICD-10-CM

## 2013-09-28 DIAGNOSIS — R0609 Other forms of dyspnea: Secondary | ICD-10-CM

## 2013-09-28 DIAGNOSIS — R0600 Dyspnea, unspecified: Secondary | ICD-10-CM

## 2013-09-28 DIAGNOSIS — I052 Rheumatic mitral stenosis with insufficiency: Secondary | ICD-10-CM | POA: Insufficient documentation

## 2013-09-28 DIAGNOSIS — Z9889 Other specified postprocedural states: Secondary | ICD-10-CM

## 2013-09-28 DIAGNOSIS — I2789 Other specified pulmonary heart diseases: Secondary | ICD-10-CM | POA: Insufficient documentation

## 2013-09-28 DIAGNOSIS — I079 Rheumatic tricuspid valve disease, unspecified: Secondary | ICD-10-CM | POA: Insufficient documentation

## 2013-09-28 LAB — BASIC METABOLIC PANEL
BUN: 12 mg/dL (ref 6–23)
CO2: 20 mEq/L (ref 19–32)
Calcium: 8.3 mg/dL — ABNORMAL LOW (ref 8.4–10.5)
Creatinine, Ser: 1.02 mg/dL (ref 0.50–1.10)
GFR calc non Af Amer: 54 mL/min — ABNORMAL LOW (ref >90)
Glucose, Bld: 92 mg/dL (ref 70–99)

## 2013-09-28 MED ORDER — POTASSIUM CHLORIDE CRYS ER 20 MEQ PO TBCR: 40.0000 meq | EXTENDED_RELEASE_TABLET | Freq: Every day | ORAL | Status: DC

## 2013-09-28 MED ORDER — METOLAZONE 2.5 MG PO TABS: ORAL_TABLET | ORAL | Status: DC

## 2013-09-28 NOTE — Patient Instructions (Signed)
Take metolazone 2.5 mg today and Wednesday with an extra 20 meq of potassium chloride ( 1 tablet).  Start taking 2.5 mg metolazone every Monday. On the days you take metolazone take an extra 20 meq (1 tablet) of potassium chloride.   Recheck labs next week.  Follow up 2-3 weeks.  Will call with lab results.  Do the following things EVERYDAY: 1) Weigh yourself in the morning before breakfast. Write it down and keep it in a log. 2) Take your medicines as prescribed 3) Eat low salt foods-Limit salt (sodium) to 2000 mg per day.  4) Stay as active as you can everyday 5) Limit all fluids for the day to less than 2 liters 6)

## 2013-09-28 NOTE — Progress Notes (Signed)
Patient ID: [name], female   DOB: 17-Jul-1941, 72 y.o.   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 Status post bioprosthetic mitral valve replacement, tricuspid valve repair, and Maze procedure on September 24, 2011.(pre-op cath minimal CAD) Coumadin stopped due to falls. CRI (Cr = 1.3) . 03/31/13 S/P DDD ICD.   Has not been able to tolerate Revatio in past.  08/12/12 RHC RA = 15 v= 21  RV = 64/3/13  PA = 59/16 (33)  PCW = 14  Fick cardiac output/index = 4.1/2.4  PVR = 7.8 Woods  FA sat = 98%  PA sat = 60%, 63%   02/17/13 Echo  RV dilated but normal systolic function.  LVEF 50%. Moderate to severe TR  Septal bounce RVSP 55-60. IVC smalll  03/27/13 Echo  EF 55-60%. Moderate to severe TR RVSP  46  Admitted to [facility] 03/26/13 through 04/04/24  had V fib arrest in setting of hypokalemia. Then had multiple episodes or recurrent VT despite correction of electrolyte abnormalities. Seen by Dr. Graciela Husbands and received ICD.   Labs (7/14): Creatinine 1.15, K+4.0  Follow up: Last visit switched to torsemide 60 mg daily and started spironolactone 12.5 mg daily. Feeling better this week. Last week had episode of increased SOB and increased her torsemide to 80 mg daily. Reports since starting iron she is eating more. +orthopnea, minimal edema and SOB with minimal exertion. Taking medications as prescribed and following low salt diet. Was taken off Metformin d/t decrease in kidney function. Weight at home 150-153 lbs.   ROS: All systems negative except as listed in HPI, PMH and Problem List.  Past Medical History  Diagnosis Date  . Pulmonary hypertension     s/p RHC 4/10 - PAP 86/29, wedge 21-23  . Gastric ulcer with hemorrhage   . Rheumatic fever     Childhood  . Coronary artery disease      Nonobstructive, LVEF 55%  . Mitral regurgitation     Moderate  . 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 Mitral stenosis with insufficiency, rheumatic   . Hypertension   . Heart murmur   . Stroke     12/11 [name],LEFT SIDE WEAKNESS  . Shortness of breath     EXERTION,PT. STATES SHE HAS PULMONARY HTN  . Pneumonia 1990'S     SEVERAL EPISODES  . Asthma     PT. STATES MILD,USES INHALERS PRN,  . Asthma     PT. ON OXYGEN 2 L/[place]   . Diabetes mellitus   . Hypothyroidism   . Anemia, pernicious 1979  . Hepatitis     PT. STATES SHE HAD IT WHEN SHE WAS 14 YRS. OLD  . Blood transfusion 2008  . Arthritis   . GERD (gastroesophageal reflux disease)   . Fibromyalgia   . Squamous cell carcinoma     Left arm  . Breast cancer     2008 s/p chemo and radiation (last tx 4/09) s/p left mastectomy s/p 9 lymph node resection - [name][facility]  . CHF (congestive heart failure) 2010    Current Outpatient Prescriptions  Medication Sig Dispense Refill  . ferrous sulfate 325 (65 FE) MG tablet Take 325 mg by mouth daily with breakfast.      . folic acid (FOLVITE)  1 MG tablet Take 1 mg by mouth 2 (two) times daily.      . hydrocodone-acetaminophen (LORCET PLUS) 7.5-650 MG per tablet Take 1 tablet by mouth every 6 (six) hours as needed for pain.      Marland Kitchen letrozole (FEMARA) 2.5 MG tablet Take 2.5 mg by mouth daily.      Marland Kitchen levothyroxine (SYNTHROID, LEVOTHROID) 137 MCG tablet Take 137 mcg by mouth daily.       Marland Kitchen lisinopril (PRINIVIL,ZESTRIL) 5 MG tablet Take 2.5 mg by mouth daily.      Marland Kitchen LORazepam (ATIVAN) 2 MG tablet Take 2 mg by mouth at bedtime as needed for anxiety.      . metolazone (ZAROXOLYN) 2.5 MG tablet TAKE 1 TABLET (2.5 MG TOTAL) BY MOUTH AS NEEDED.  10 tablet  6  . metoprolol tartrate (LOPRESSOR) 25 MG tablet Take 1 tablet (25 mg total) by mouth 2 (two) times daily.  60 tablet  10  . mirtazapine (REMERON) 15 MG tablet Take 15 mg by mouth at bedtime.       .  pantoprazole (PROTONIX) 40 MG tablet Take 40 mg by mouth every evening.      . potassium chloride SA (K-DUR,KLOR-CON) 20 MEQ tablet Take 2 tablets (40 mEq total) by mouth daily.  60 tablet  10  . rosuvastatin (CRESTOR) 5 MG tablet Take 5 mg by mouth daily.      Marland Kitchen spironolactone (ALDACTONE) 25 MG tablet Take 0.5 tablets (12.5 mg total) by mouth daily.  30 tablet  6  . torsemide (DEMADEX) 20 MG tablet Take 3 tablets (60 mg total) by mouth daily.  90 tablet  6  . VITAMIN B1-B12 IJ Inject 1 mL into the skin every 30 (thirty) days.       No current facility-administered medications for this encounter.    Filed Vitals:   09/28/13 1144  BP: 126/84  Pulse: 83  Weight: 155 lb 6.4 oz (70.489 kg)  SpO2: 100%   PHYSICAL EXAM: General:  Chronically ill appearing.  No acute distress.No resp difficulty; daughter present.  HEENT: normal Neck: supple. JVP to ear with +CV waves. Carotids 2+ bilaterally; no bruits. No lymphadenopathy or thryomegaly appreciated. Cor: PMI normal. regular rate & rhythm. No rubs, gallops or 3/6 TR. soft P2. 2/6 AS murmur. +s3 Lungs:  clear Abdomen: soft, obese,  nontender, mildly distended.No bruits or masses. Good bowel sounds. Extremities: R and L fingers cyanotic. No clubbing, 2+ LE edema Neuro: alert & orientedx3, cranial nerves grossly intact. Moves all 4 extremities w/o difficulty. Affect pleasant.   ASSESSMENT & PLAN:  1) Chronic diastolic HF, EF 55-60% (R>L) - NYHA III/IIIb symptoms and volume status appears markedly elevated, but difficult to assess d/t TR. Weight is up another 10 lbs since last visit. Will give her 2.5 mg metolazone today and tomorrow along with an extra 20 meq KCL. She will then start metolazone 2.5 mg every Monday with an extra 20 meq of potassium. Will get BMET and pro-BNP today and recheck BMET next week. - Instructed to call if weight is not trending down or if it starts increasing.  - Reinforced the need and importance of daily weights, a  low sodium diet, and fluid restriction (less than 2 L a day). Instructed to call the HF clinic if weight increases more than 3 lbs overnight or 5 lbs in a week.  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 Will discuss with Dr. Gala Romney whether she  could benefit from ERA 3) Rheumatic heart disease with MS/MR/TR, CVA  -s/p bioprosthetic MVR, tricuspid valve repair, and Maze procedure on September 24, 2011 - recurrent TR but not felt to be candidate for repeat repair (has seen Dr. Cornelius Moras) 4) AF, s/p Maze -off coumadin due to falls   Follow up 2 -3 weeks.  Aundria Rud, NP-C 12:10 PM

## 2013-09-30 ENCOUNTER — Other Ambulatory Visit: Payer: Medicare Other

## 2013-10-06 ENCOUNTER — Encounter (HOSPITAL_COMMUNITY): Payer: Medicare Other

## 2013-10-11 ENCOUNTER — Encounter: Payer: Medicare Other | Admitting: *Deleted

## 2013-10-19 ENCOUNTER — Encounter (HOSPITAL_COMMUNITY): Payer: Medicare Other

## 2013-10-26 ENCOUNTER — Encounter: Payer: Self-pay | Admitting: *Deleted

## 2013-11-02 ENCOUNTER — Ambulatory Visit (HOSPITAL_COMMUNITY)
Admission: RE | Admit: 2013-11-02 | Discharge: 2013-11-02 | Disposition: A | Discharge status: Home / Self Care | Payer: Medicare Other | Source: Ambulatory Visit | Attending: Internal Medicine | Admitting: Internal Medicine

## 2013-11-02 ENCOUNTER — Encounter (HOSPITAL_COMMUNITY): Payer: Self-pay

## 2013-11-02 VITALS — BP 126/78 | HR 87 | Wt 158.0 lb

## 2013-11-02 DIAGNOSIS — I5042 Chronic combined systolic (congestive) and diastolic (congestive) heart failure: Secondary | ICD-10-CM

## 2013-11-02 DIAGNOSIS — I5032 Chronic diastolic (congestive) heart failure: Secondary | ICD-10-CM | POA: Insufficient documentation

## 2013-11-02 MED ORDER — METOLAZONE 2.5 MG PO TABS: ORAL_TABLET | ORAL | Status: DC

## 2013-11-02 NOTE — Patient Instructions (Signed)
Follow up in 2 weeks  Take Metolazone 2.5 mg today then take 2.5 mg Metolazone every Monday and Friday Along with an extra potassium  Do the following things EVERYDAY: 1) Weigh yourself in the morning before breakfast. Write it down and keep it in a log. 2) Take your medicines as prescribed 3) Eat low salt foods-Limit salt (sodium) to 2000 mg per day.  4) Stay as active as you can everyday 5) Limit all fluids for the day to less than 2 liters

## 2013-11-02 NOTE — Progress Notes (Signed)
Patient ID: [name], female   DOB: 11-25-1940, 72 y.o.   MRN: [id number]  PCP: Dr Darius Bump EP: Dr Ladona Ridgel  HPI: 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 Status post bioprosthetic mitral valve replacement, tricuspid valve repair, and Maze procedure on September 24, 2011.(pre-op cath minimal CAD) Coumadin stopped due to falls. CRI (Cr = 1.3) . 03/31/13 S/P DDD ICD.   Has not been able to tolerate Revatio in past.  08/12/12 RHC RA = 15 v= 21  RV = 64/3/13  PA = 59/16 (33)  PCW = 14  Fick cardiac output/index = 4.1/2.4  PVR = 7.8 Woods  FA sat = 98%  PA sat = 60%, 63%   02/17/13 Echo  RV dilated but normal systolic function.  LVEF 50%. Moderate to severe TR  Septal bounce RVSP 55-60. IVC smalll  03/27/13 Echo  EF 55-60%. Moderate to severe TR RVSP  46  Admitted to [facility] 03/26/13 through 04/04/24  had V fib arrest in setting of hypokalemia. Then had multiple episodes or recurrent VT despite correction of electrolyte abnormalities. Seen by Dr. Graciela Husbands and received ICD.   Labs (7/14): Creatinine 1.15, K+4.0 Labs 09/28/13 K 4.5 Creatinine 1.02   She returns for follow up with her daughter. Last visit she was instructed to take Metolazone for 2 days then every Monday. She also says she has only been taking torsemide 40 mg daily and not 60 mg as recommended. Says she did not take diuretics today. Weight at home trending up from 148 to 156 pounds.  Complains of dyspnea with exertion. Denies PND/Orthopnea.  Ambulates with a rolling walker. Appetite improved. Drinking < 2 liters of fluid.   ROS: All systems negative except as listed in HPI, PMH and Problem List.  Past Medical History  Diagnosis Date  . Pulmonary hypertension     s/p RHC 4/10 - PAP 86/29, wedge 21-23  . Gastric ulcer with hemorrhage   . Rheumatic fever     Childhood   . Coronary artery disease     Nonobstructive, LVEF 55%  . Mitral regurgitation     Moderate  . 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 Mitral stenosis with insufficiency, rheumatic   . Hypertension   . Heart murmur   . Stroke     12/11 [place],LEFT SIDE WEAKNESS  . Shortness of breath     EXERTION,PT. STATES SHE HAS PULMONARY HTN  . Pneumonia 1990'S     SEVERAL EPISODES  . Asthma     PT. STATES MILD,USES INHALERS PRN,  . Asthma     PT. ON OXYGEN 2 L/[place]   . Diabetes mellitus   . Hypothyroidism   . Anemia, pernicious 1979  . Hepatitis     PT. STATES SHE HAD IT WHEN SHE WAS 14 YRS. OLD  . Blood transfusion 2008  . Arthritis   . GERD (gastroesophageal reflux disease)   . Fibromyalgia   . Squamous cell carcinoma     Left arm  . Breast cancer     2008 s/p chemo and radiation (last tx 4/09) s/p left mastectomy s/p 9 lymph node resection - [name][facility]  . CHF (congestive heart failure) 2010    Current Outpatient Prescriptions  Medication Sig Dispense Refill  . ferrous sulfate 325 (65 FE) MG tablet Take 325 mg by mouth  daily with breakfast.      . folic acid (FOLVITE) 1 MG tablet Take 1 mg by mouth 2 (two) times daily.      . hydrocodone-acetaminophen (LORCET PLUS) 7.5-650 MG per tablet Take 1 tablet by mouth every 6 (six) hours as needed for pain.      Marland Kitchen letrozole (FEMARA) 2.5 MG tablet Take 2.5 mg by mouth daily.      Marland Kitchen levothyroxine (SYNTHROID, LEVOTHROID) 137 MCG tablet Take 137 mcg by mouth daily.       Marland Kitchen lisinopril (PRINIVIL,ZESTRIL) 5 MG tablet Take 2.5 mg by mouth daily.      Marland Kitchen LORazepam (ATIVAN) 2 MG tablet Take 2 mg by mouth at bedtime as needed for anxiety.      . metolazone (ZAROXOLYN) 2.5 MG tablet Take 2.5 mg every Monday and as directed. When you take metolazone take an extra 20 meq of potassium chloride.  10 tablet  3  . metoprolol tartrate (LOPRESSOR) 25 MG tablet Take 1 tablet (25 mg total) by mouth 2 (two) times daily.  60 tablet  10   . mirtazapine (REMERON) 15 MG tablet Take 15 mg by mouth at bedtime.       . pantoprazole (PROTONIX) 40 MG tablet Take 40 mg by mouth every evening.      . potassium chloride SA (K-DUR,KLOR-CON) 20 MEQ tablet Take 2 tablets (40 mEq total) by mouth daily. Take an extra 20 meq (1 tablet) on days you take metolazone.  75 tablet  10  . rosuvastatin (CRESTOR) 5 MG tablet Take 5 mg by mouth daily.      Marland Kitchen spironolactone (ALDACTONE) 25 MG tablet Take 0.5 tablets (12.5 mg total) by mouth daily.  30 tablet  6  . torsemide (DEMADEX) 20 MG tablet Take 3 tablets (60 mg total) by mouth daily.  90 tablet  6  . VITAMIN B1-B12 IJ Inject 1 mL into the skin every 30 (thirty) days.       No current facility-administered medications for this encounter.    Filed Vitals:   11/02/13 1505  BP: 126/78  Pulse: 87  Weight: 158 lb (71.668 kg)  SpO2: 93%   PHYSICAL EXAM: General:  Chronically ill appearing.  No acute distress.No resp difficulty; daughter present. Ambulated into the clinic with a walker.  HEENT: normal Neck: supple. JVP to ear with +CV waves. Carotids 2+ bilaterally; no bruits. No lymphadenopathy or thryomegaly appreciated. Cor: PMI normal. regular rate & rhythm. No rubs, gallops or 3/6 TR. soft P2. 2/6 AS murmur. +s3 Lungs:  clear Abdomen: soft, obese,  nontender, mildly distended.No bruits or masses. Good bowel sounds. Extremities: R and L fingers cyanotic. No clubbing, 2+ LE edema Neuro: alert & orientedx3, cranial nerves grossly intact. Moves all 4 extremities w/o difficulty. Affect pleasant.   ASSESSMENT & PLAN:  1) Chronic diastolic HF, EF 55-60% (R>L) - NYHA III/IIIb . Volume status elevated but she has not been taking torsemide as instructed.   Today she is instructed to take extra 2.5 Metolazone today with extra 20 meq of potassium followed by 2.5 mg Metolazone on Monday and Friday with extra 20 meq of potassium. Continue 60 mg torsemide daily.  - Reinforced the need and importance of  daily weights, a low sodium diet, and fluid restriction (less than 2 L a day). Instructed to call the HF clinic if weight increases more than 3 lbs overnight or 5 lbs in a week Check BMET and Pro BNP today .but unable to obtain due to  hemolysis. Will contact PCP to repeat BMET.    Follow up 2 -3 weeks to reassess volume status  CLEGG,AMY, NP-C 3:20 PM

## 2013-11-03 ENCOUNTER — Telehealth (HOSPITAL_COMMUNITY): Payer: Self-pay | Admitting: *Deleted

## 2013-11-03 NOTE — Telephone Encounter (Signed)
At appointment yesterday labs were drawn (bmet, pro bnp) however the lab called to let us know that the blood was hemolyzed and they could not run test, pt's daughter is aware and will take pt to pcp Dr Darius Bump on Fri for labs, order faxed to them at [phone]

## 2013-11-16 ENCOUNTER — Ambulatory Visit (HOSPITAL_COMMUNITY)
Admission: RE | Admit: 2013-11-16 | Discharge: 2013-11-16 | Disposition: A | Discharge status: Home / Self Care | Payer: Medicare Other | Source: Ambulatory Visit | Attending: Internal Medicine | Admitting: Internal Medicine

## 2013-11-16 VITALS — BP 112/80 | HR 85 | Wt 147.0 lb

## 2013-11-16 DIAGNOSIS — I509 Heart failure, unspecified: Secondary | ICD-10-CM

## 2013-11-16 DIAGNOSIS — I5032 Chronic diastolic (congestive) heart failure: Secondary | ICD-10-CM | POA: Insufficient documentation

## 2013-11-16 DIAGNOSIS — I5082 Biventricular heart failure: Secondary | ICD-10-CM

## 2013-11-16 LAB — BASIC METABOLIC PANEL
BUN: 49 mg/dL — ABNORMAL HIGH (ref 6–23)
CHLORIDE: 96 meq/L (ref 96–112)
CO2: 24 mEq/L (ref 19–32)
CREATININE: 1.22 mg/dL — AB (ref 0.50–1.10)
Calcium: 9.2 mg/dL (ref 8.4–10.5)
GFR calc non Af Amer: 44 mL/min — ABNORMAL LOW (ref >90)
GFR, EST AFRICAN AMERICAN: 51 mL/min — AB (ref >90)
Glucose, Bld: 142 mg/dL — ABNORMAL HIGH (ref 70–99)
POTASSIUM: 5.2 meq/L (ref 3.7–5.3)
SODIUM: 136 meq/L — AB (ref 137–147)

## 2013-11-16 NOTE — Progress Notes (Signed)
Patient ID: [name], female   DOB: 1941-07-21, 73 y.o.   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 Status post bioprosthetic mitral valve replacement, tricuspid valve repair, and Maze procedure on September 24, 2011.(pre-op cath minimal CAD) Coumadin stopped due to falls. CRI (Cr = 1.3) . 03/31/13 S/P DDD ICD. Has not been able to tolerate Revatio in past.  Admitted to [facility] 03/26/13 through 04/04/24  had V fib arrest in setting of hypokalemia. Then had multiple episodes or recurrent VT despite correction of electrolyte abnormalities. Seen by Dr. Caryl Comes and received ICD.   She returns for follow up with her daughter and husband.  Last visit she was instructed to take and additional Metolazone. She has been taking 60 mg torsemide as recommended. She has not had her diuretics today because of this appointment.  Weight at home has been trending down from 155 to 145 pounds. Overall she is feeling much better. Able to walk increased distances while shopping.  Denies PND. Sleeps on 2-3 pillows. Uses rolling walker.   08/12/12 RHC RA = 15 v= 21  RV = 64/3/13  PA = 59/16 (33)  PCW = 14  Fick cardiac output/index = 4.1/2.4  PVR = 7.8 Woods  FA sat = 98%  PA sat = 60%, 63%   02/17/13 Echo  RV dilated but normal systolic function.  LVEF 50%. Moderate to severe TR  Septal bounce RVSP 55-60. IVC small 03/27/13 Echo  EF 55-60%. Moderate to severe TR RVSP  46  Labs (7/14): Creatinine 1.15, K+4.0 Labs 09/28/13 K 4.5 Creatinine 1.02  Labs 11/16/12 K 5.2 Creatine 1.22 no diuretics this am.    ROS: All systems negative except as listed in HPI, PMH and Problem List.  Past Medical History  Diagnosis Date  . Pulmonary hypertension     s/p RHC 4/10 - PAP 86/29, wedge 21-23  . Gastric ulcer with  hemorrhage   . Rheumatic fever     Childhood  . Coronary artery disease     Nonobstructive, LVEF 55%  . Mitral regurgitation     Moderate  . 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 Mitral stenosis with insufficiency, rheumatic   . Hypertension   . Heart murmur   . Stroke     12/11 [place],LEFT SIDE WEAKNESS  . Shortness of breath     EXERTION,PT. STATES SHE HAS PULMONARY HTN  . Pneumonia 1990'S     SEVERAL EPISODES  . Asthma     PT. STATES MILD,USES INHALERS PRN,  . Asthma     PT. ON OXYGEN 2 L/[place]   . Diabetes mellitus   . Hypothyroidism   . Anemia, pernicious 1979  . Hepatitis     PT. STATES SHE HAD IT WHEN SHE WAS 14 YRS. OLD  . Blood transfusion 2008  . Arthritis   . GERD (gastroesophageal reflux disease)   . Fibromyalgia   . Squamous cell carcinoma     Left arm  . Breast cancer     2008 s/p chemo and radiation (last tx 4/09) s/p left mastectomy s/p 9 lymph node resection - [name], [facility]  . CHF (congestive heart failure) 2010    Current Outpatient Prescriptions  Medication Sig Dispense Refill  . ferrous sulfate 325 (65 FE)  MG tablet Take 325 mg by mouth daily with breakfast.      . folic acid (FOLVITE) 1 MG tablet Take 1 mg by mouth 2 (two) times daily.      . hydrocodone-acetaminophen (LORCET PLUS) 7.5-650 MG per tablet Take 1 tablet by mouth every 6 (six) hours as needed for pain.      Marland Kitchen letrozole (FEMARA) 2.5 MG tablet Take 2.5 mg by mouth daily.      Marland Kitchen levothyroxine (SYNTHROID, LEVOTHROID) 137 MCG tablet Take 137 mcg by mouth daily.       Marland Kitchen lisinopril (PRINIVIL,ZESTRIL) 5 MG tablet Take 2.5 mg by mouth daily.      Marland Kitchen LORazepam (ATIVAN) 2 MG tablet Take 2 mg by mouth at bedtime as needed for anxiety.      . metolazone (ZAROXOLYN) 2.5 MG tablet Take 2.5 mg every Monday and Friday. When you take metolazone take an extra 20 meq of potassium chloride.  10 tablet  3  . metoprolol tartrate (LOPRESSOR) 25 MG tablet Take 1 tablet (25 mg total) by  mouth 2 (two) times daily.  60 tablet  10  . mirtazapine (REMERON) 15 MG tablet Take 15 mg by mouth at bedtime.       . pantoprazole (PROTONIX) 40 MG tablet Take 40 mg by mouth every evening.      . potassium chloride SA (K-DUR,KLOR-CON) 20 MEQ tablet Take 2 tablets (40 mEq total) by mouth daily. Take an extra 20 meq (1 tablet) on days you take metolazone.  75 tablet  10  . rosuvastatin (CRESTOR) 5 MG tablet Take 5 mg by mouth daily.      Marland Kitchen spironolactone (ALDACTONE) 25 MG tablet Take 0.5 tablets (12.5 mg total) by mouth daily.  30 tablet  6  . torsemide (DEMADEX) 20 MG tablet Take 3 tablets (60 mg total) by mouth daily.  90 tablet  6  . VITAMIN B1-B12 IJ Inject 1 mL into the skin every 30 (thirty) days.       No current facility-administered medications for this encounter.    Filed Vitals:   11/16/13 1338  BP: 112/80  Pulse: 85  Weight: 147 lb (66.679 kg)  SpO2: 99%   PHYSICAL EXAM: General:  Chronically ill appearing.  No acute distress.No resp difficulty; daughter present. Ambulated into the clinic with a walker.  HEENT: normal Neck: supple. JVP ~10 with +CV waves. Carotids 2+ bilaterally; no bruits. No lymphadenopathy or thryomegaly appreciated. Cor: PMI normal. regular rate & rhythm. No rubs, gallops or 3/6 TR. soft P2. 2/6 AS murmur. +s3 Lungs:  clear Abdomen: soft, obese,  nontender, mildly distended.No bruits or masses. Good bowel sounds. Extremities: R and L fingers cyanotic. No clubbing, LE edema Neuro: alert & orientedx3, cranial nerves grossly intact. Moves all 4 extremities w/o difficulty. Affect pleasant.   ASSESSMENT & PLAN:  1) Chronic diastolic HF, EF 63-01% (R>L) - NYHA III/IIIb . Functional improvement. NYHA II. Volume status improved.  Goal for weight in  HF clinic is 142-145 pounds. Continue torsemide 60 mg daily and metolazone 2.5 mg twice a week.  - Reinforced the need and importance of daily weights, a low sodium diet, and fluid restriction (less than 2 L a  day). Instructed to call the HF clinic if weight increases more than 3 lbs overnight or 5 lbs in a week Check BMET--->K 5.2 Creatine 1.22 no diuretic this am.   Follow up in 1 month to reassess volume status   Danyel Tobey, NP-C 1:39 PM

## 2013-11-16 NOTE — Patient Instructions (Signed)
Follow up in 1 month   Do the following things EVERYDAY: 1) Weigh yourself in the morning before breakfast. Write it down and keep it in a log. 2) Take your medicines as prescribed 3) Eat low salt foods-Limit salt (sodium) to 2000 mg per day.  4) Stay as active as you can everyday 5) Limit all fluids for the day to less than 2 liters 

## 2013-12-08 ENCOUNTER — Encounter (HOSPITAL_COMMUNITY): Payer: Medicare Other

## 2013-12-10 ENCOUNTER — Encounter: Payer: Self-pay | Admitting: Internal Medicine

## 2013-12-13 ENCOUNTER — Ambulatory Visit: Payer: Medicare Other | Admitting: Thoracic Surgery (Cardiothoracic Vascular Surgery)

## 2014-01-07 ENCOUNTER — Other Ambulatory Visit (HOSPITAL_COMMUNITY): Payer: Self-pay

## 2014-01-07 ENCOUNTER — Telehealth: Payer: Self-pay | Admitting: *Deleted

## 2014-01-07 MED ORDER — POTASSIUM CHLORIDE CRYS ER 20 MEQ PO TBCR: 40.0000 meq | EXTENDED_RELEASE_TABLET | Freq: Every day | ORAL | Status: DC

## 2014-01-07 MED ORDER — METOPROLOL TARTRATE 25 MG PO TABS: 25.0000 mg | ORAL_TABLET | Freq: Two times a day (BID) | ORAL | Status: DC

## 2014-01-07 NOTE — Telephone Encounter (Signed)
Faxed rx for metoprolol tart 25mg  #[facility] f [phone] p [phone]

## 2014-01-17 ENCOUNTER — Other Ambulatory Visit (HOSPITAL_COMMUNITY): Payer: Self-pay

## 2014-01-18 ENCOUNTER — Encounter (HOSPITAL_COMMUNITY): Payer: Self-pay

## 2014-01-18 ENCOUNTER — Ambulatory Visit (HOSPITAL_COMMUNITY)
Admission: RE | Admit: 2014-01-18 | Discharge: 2014-01-18 | Disposition: A | Discharge status: Home / Self Care | Payer: Medicare Other | Source: Ambulatory Visit | Attending: Internal Medicine | Admitting: Internal Medicine

## 2014-01-18 VITALS — BP 123/64 | HR 77 | Resp 18 | Wt 150.2 lb

## 2014-01-18 DIAGNOSIS — K219 Gastro-esophageal reflux disease without esophagitis: Secondary | ICD-10-CM | POA: Insufficient documentation

## 2014-01-18 DIAGNOSIS — E876 Hypokalemia: Secondary | ICD-10-CM | POA: Insufficient documentation

## 2014-01-18 DIAGNOSIS — Z9010 Acquired absence of unspecified breast and nipple: Secondary | ICD-10-CM | POA: Insufficient documentation

## 2014-01-18 DIAGNOSIS — K277 Chronic peptic ulcer, site unspecified, without hemorrhage or perforation: Secondary | ICD-10-CM | POA: Insufficient documentation

## 2014-01-18 DIAGNOSIS — R011 Cardiac murmur, unspecified: Secondary | ICD-10-CM | POA: Insufficient documentation

## 2014-01-18 DIAGNOSIS — D649 Anemia, unspecified: Secondary | ICD-10-CM | POA: Insufficient documentation

## 2014-01-18 DIAGNOSIS — I10 Essential (primary) hypertension: Secondary | ICD-10-CM | POA: Insufficient documentation

## 2014-01-18 DIAGNOSIS — J45909 Unspecified asthma, uncomplicated: Secondary | ICD-10-CM | POA: Insufficient documentation

## 2014-01-18 DIAGNOSIS — Z954 Presence of other heart-valve replacement: Secondary | ICD-10-CM | POA: Insufficient documentation

## 2014-01-18 DIAGNOSIS — I2510 Atherosclerotic heart disease of native coronary artery without angina pectoris: Secondary | ICD-10-CM | POA: Insufficient documentation

## 2014-01-18 DIAGNOSIS — I5032 Chronic diastolic (congestive) heart failure: Secondary | ICD-10-CM

## 2014-01-18 DIAGNOSIS — I509 Heart failure, unspecified: Secondary | ICD-10-CM | POA: Insufficient documentation

## 2014-01-18 DIAGNOSIS — I059 Rheumatic mitral valve disease, unspecified: Secondary | ICD-10-CM | POA: Insufficient documentation

## 2014-01-18 DIAGNOSIS — I2789 Other specified pulmonary heart diseases: Secondary | ICD-10-CM | POA: Insufficient documentation

## 2014-01-18 DIAGNOSIS — Z853 Personal history of malignant neoplasm of breast: Secondary | ICD-10-CM | POA: Insufficient documentation

## 2014-01-18 DIAGNOSIS — I079 Rheumatic tricuspid valve disease, unspecified: Secondary | ICD-10-CM | POA: Insufficient documentation

## 2014-01-18 DIAGNOSIS — E039 Hypothyroidism, unspecified: Secondary | ICD-10-CM | POA: Insufficient documentation

## 2014-01-18 DIAGNOSIS — E119 Type 2 diabetes mellitus without complications: Secondary | ICD-10-CM | POA: Insufficient documentation

## 2014-01-18 DIAGNOSIS — IMO0001 Reserved for inherently not codable concepts without codable children: Secondary | ICD-10-CM | POA: Insufficient documentation

## 2014-01-18 NOTE — Progress Notes (Signed)
Patient ID: [name], female   DOB: [birth year], 73 y.o.   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 Status post bioprosthetic mitral valve replacement, tricuspid valve repair, and Maze procedure on September 24, 2011.(pre-op cath minimal CAD) Coumadin stopped due to falls. CRI (Cr = 1.3) . 03/31/13 S/P DDD ICD. Has not been able to tolerate Revatio in past.  Admitted to [facility] 03/26/13 through 04/04/24  had V fib arrest in setting of hypokalemia. Then had multiple episodes or recurrent VT despite correction of electrolyte abnormalities. Seen by Dr. Caryl Comes and received ICD.   She returns for follow up with her daughter and husband.  Last visit she was instructed to take metolazone twice a week in addition to 60 mg torsemide. She had gastritis with N/V/D in late February and she was admitted to [facility]. Diuretics were held and she was given IV fluids. She was discharged off diuretics. Last week Dr Jerolyn Center, PCP,  started torsemide 20 mg daily. Weight at home has been 146-151 pounds. Sleeps on 2-3 pillows. Uses rolling walker. Denies SOB/PND. Compliant with medications.   08/12/12 RHC RA = 15 v= 21  RV = 64/3/13  PA = 59/16 (33)  PCW = 14  Fick cardiac output/index = 4.1/2.4  PVR = 7.8 Woods  FA sat = 98%  PA sat = 60%, 63%   02/17/13 Echo  RV dilated but normal systolic function.  LVEF 50%. Moderate to severe TR  Septal bounce RVSP 55-60. IVC small 03/27/13 Echo  EF 55-60%. Moderate to severe TR RVSP  46  Labs (7/14): Creatinine 1.15, K+4.0 Labs 09/28/13 K 4.5 Creatinine 1.02  Labs 11/16/12 K 5.2 Creatine 1.22 no diuretics this am.    ROS: All systems negative except as listed in HPI, PMH and Problem List.  Past Medical History  Diagnosis Date  . Pulmonary  hypertension     s/p RHC 4/10 - PAP 86/29, wedge 21-23  . Gastric ulcer with hemorrhage   . Rheumatic fever     Childhood  . Coronary artery disease     Nonobstructive, LVEF 55%  . Mitral regurgitation     Moderate  . 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 Mitral stenosis with insufficiency, rheumatic   . Hypertension   . Heart murmur   . Stroke     12/11 [name],LEFT SIDE WEAKNESS  . Shortness of breath     EXERTION,PT. STATES SHE HAS PULMONARY HTN  . Pneumonia 1990'S     SEVERAL EPISODES  . Asthma     PT. STATES MILD,USES INHALERS PRN,  . Asthma     PT. ON OXYGEN 2 L/[place]   . Diabetes mellitus   . Hypothyroidism   . Anemia, pernicious 1979  . Hepatitis     PT. STATES SHE HAD IT WHEN SHE WAS 14 YRS. OLD  . Blood transfusion 2008  . Arthritis   . GERD (gastroesophageal reflux disease)   . Fibromyalgia   . Squamous cell carcinoma     Left arm  . Breast cancer     2008 s/p chemo and radiation (last tx 4/09) s/p left mastectomy s/p 9 lymph node resection - [name], [place]  . CHF (congestive heart failure) 2010    Current Outpatient Prescriptions  Medication  Sig Dispense Refill  . ferrous sulfate 325 (65 FE) MG tablet Take 325 mg by mouth daily with breakfast.      . folic acid (FOLVITE) 1 MG tablet Take 1 mg by mouth 2 (two) times daily.      . hydrocodone-acetaminophen (LORCET PLUS) 7.5-650 MG per tablet Take 1 tablet by mouth every 6 (six) hours as needed for pain.      Marland Kitchen letrozole (FEMARA) 2.5 MG tablet Take 2.5 mg by mouth daily.      Marland Kitchen levothyroxine (SYNTHROID, LEVOTHROID) 137 MCG tablet Take 137 mcg by mouth daily.       Marland Kitchen lisinopril (PRINIVIL,ZESTRIL) 5 MG tablet Take 2.5 mg by mouth daily.      Marland Kitchen LORazepam (ATIVAN) 2 MG tablet Take 2 mg by mouth at bedtime as needed for anxiety.      . metoprolol tartrate (LOPRESSOR) 25 MG tablet Take 1 tablet (25 mg total) by mouth 2 (two) times daily.  60 tablet  0  . mirtazapine (REMERON) 15 MG tablet  Take 15 mg by mouth at bedtime.       . pantoprazole (PROTONIX) 40 MG tablet Take 40 mg by mouth every evening.      . potassium chloride SA (K-DUR,KLOR-CON) 20 MEQ tablet Take 20 mEq by mouth daily.       . rosuvastatin (CRESTOR) 5 MG tablet Take 5 mg by mouth daily.      Marland Kitchen spironolactone (ALDACTONE) 25 MG tablet Take 0.5 tablets (12.5 mg total) by mouth daily.  30 tablet  6  . temazepam (RESTORIL) 30 MG capsule Take 30 mg by mouth at bedtime as needed for sleep.      Marland Kitchen VITAMIN B1-B12 IJ Inject 1 mL into the skin every 30 (thirty) days.      Marland Kitchen torsemide (DEMADEX) 20 MG tablet Take 20 mg by mouth daily.       No current facility-administered medications for this encounter.    Filed Vitals:   01/18/14 1037  BP: 123/64  Pulse: 77  Resp: 18  Weight: 150 lb 4 oz (68.153 kg)  SpO2: 98%   PHYSICAL EXAM: General:  Chronically ill appearing.  No acute distress.No resp difficulty; daughter present. Ambulated into the clinic with a walker.  HEENT: normal Neck: supple. JVP ~8-9 with +CV waves. Carotids 2+ bilaterally; no bruits. No lymphadenopathy or thryomegaly appreciated. Cor: PMI normal. regular rate & rhythm. No rubs, gallops or 3/6 TR. soft P2. 2/6 AS murmur. +s3 Lungs:  clear Abdomen: soft, obese,  nontender, mildly distended.No bruits or masses. Good bowel sounds. Extremities: R and L fingers cyanotic. No clubbing, LE edema 1+  Neuro: alert & orientedx3, cranial nerves grossly intact. Moves all 4 extremities w/o difficulty. Affect pleasant.   ASSESSMENT & PLAN:  1) Chronic diastolic HF, EF 51-88% (R>L) - NYHA II. She was hospitalized in late February for gastritis N/V/D. Diuretics cut back.  Today volume status mildly elevated but she has not had diuretics this am due to appointment.  Continue torsemide 20 mg in am and add 20 mg for 3 pound weight gain. Continue potassium 20 meq daily and add 20 meq if she takes extra torsemide.  - Reinforced the need and importance of daily weights,  a low sodium diet, and fluid restriction (less than 2 L a day). Instructed to call the HF clinic if weight increases more than 3 lbs overnight or 5 lbs in a week   Follow up in 1 month to reassess volume status.  CLEGG,AMY, NP-C 11:04 AM

## 2014-01-18 NOTE — Patient Instructions (Signed)
Follow up in 1 month  Take an 20 mg of torsemide if you gain 3 pounds in one day   If you take an extra torsemide take an extra 20 meq potassium  Do the following things EVERYDAY: 1) Weigh yourself in the morning before breakfast. Write it down and keep it in a log. 2) Take your medicines as prescribed 3) Eat low salt foods-Limit salt (sodium) to 2000 mg per day.  4) Stay as active as you can everyday 5) Limit all fluids for the day to less than 2 liters

## 2014-01-31 ENCOUNTER — Encounter: Payer: Self-pay | Admitting: *Deleted

## 2014-02-17 ENCOUNTER — Encounter (HOSPITAL_COMMUNITY): Payer: Self-pay

## 2014-02-17 ENCOUNTER — Ambulatory Visit (HOSPITAL_COMMUNITY)
Admission: RE | Admit: 2014-02-17 | Discharge: 2014-02-17 | Disposition: A | Discharge status: Home / Self Care | Payer: Medicare Other | Source: Ambulatory Visit | Attending: Internal Medicine | Admitting: Internal Medicine

## 2014-02-17 VITALS — BP 123/64 | HR 71 | Resp 18 | Wt 154.4 lb

## 2014-02-17 DIAGNOSIS — R079 Chest pain, unspecified: Secondary | ICD-10-CM | POA: Insufficient documentation

## 2014-02-17 DIAGNOSIS — I5032 Chronic diastolic (congestive) heart failure: Secondary | ICD-10-CM

## 2014-02-17 DIAGNOSIS — I4891 Unspecified atrial fibrillation: Secondary | ICD-10-CM

## 2014-02-17 DIAGNOSIS — I2510 Atherosclerotic heart disease of native coronary artery without angina pectoris: Secondary | ICD-10-CM

## 2014-02-17 NOTE — Patient Instructions (Signed)
Will schedule stress test.  Take an extra 20 mg torsemide and 20 meq of potassium in the afternoon if weight greater than 151 lbs.  Follow up 6 weeks  Call any issues  Do the following things EVERYDAY: 1) Weigh yourself in the morning before breakfast. Write it down and keep it in a log. 2) Take your medicines as prescribed 3) Eat low salt foods-Limit salt (sodium) to 2000 mg per day.  4) Stay as active as you can everyday 5) Limit all fluids for the day to less than 2 liters

## 2014-02-17 NOTE — Progress Notes (Addendum)
Patient ID: [name], female   DOB: May 08, 1941, 73 y.o.   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 Status post bioprosthetic mitral valve replacement, tricuspid valve repair, and Maze procedure on September 24, 2011.(pre-op cath minimal CAD) Coumadin stopped due to falls. CRI (Cr = 1.3) . 03/31/13 S/P DDD ICD. Has not been able to tolerate Revatio in past.  Admitted to [facility] 03/26/13 through 04/04/13  had V fib arrest in setting of hypokalemia. Then had multiple episodes or recurrent VT despite correction of electrolyte abnormalities. Seen by Dr. Caryl Comes and received ICD.   08/12/12 RHC RA = 15 v= 21  RV = 64/3/13  PA = 59/16 (33)  PCW = 14  Fick cardiac output/index = 4.1/2.4  PVR = 7.8 Woods  FA sat = 98%  PA sat = 60%, 63%   Follow up: Daughter present. Weight trending up 148-150 lbs. Eating more. Denies SOB, PND, or edema. +orthopnea. Now has night O2 oxygen saturations were dropping to 70s at night. +CP last Thursday with nausea and emesis. Pain was heavy and went to back shoulder blade, lasted less than 10 minutes. Following a low salt diet and drinking less than 2L a day. Denies palpitations.   02/17/13 Echo  RV dilated but normal systolic function.  LVEF 50%. Moderate to severe TR  Septal bounce RVSP 55-60. IVC small 03/27/13 Echo  EF 55-60%. Moderate to severe TR RVSP  46  Labs (7/14): Creatinine 1.15, K+4.0 Labs 09/28/13 K 4.5 Creatinine 1.02  Labs 11/16/12 K 5.2 Creatine 1.22 no diuretics this am.    ROS: All systems negative except as listed in HPI, PMH and Problem List.  Past Medical History  Diagnosis Date  . Pulmonary hypertension     s/p RHC 4/10 - PAP 86/29, wedge 21-23  . Gastric ulcer with hemorrhage   . Rheumatic fever     Childhood  . Coronary artery  disease     Nonobstructive, LVEF 55%  . Mitral regurgitation     Moderate  . 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 Mitral stenosis with insufficiency, rheumatic   . Hypertension   . Heart murmur   . Stroke     12/11 [name],LEFT SIDE WEAKNESS  . Shortness of breath     EXERTION,PT. STATES SHE HAS PULMONARY HTN  . Pneumonia 1990'S     SEVERAL EPISODES  . Asthma     PT. STATES MILD,USES INHALERS PRN,  . Asthma     PT. ON OXYGEN 2 L/[name]   . Diabetes mellitus   . Hypothyroidism   . Anemia, pernicious 1979  . Hepatitis     PT. STATES SHE HAD IT WHEN SHE WAS 14 YRS. OLD  . Blood transfusion 2008  . Arthritis   . GERD (gastroesophageal reflux disease)   . Fibromyalgia   . Squamous cell carcinoma     Left arm  . Breast cancer     2008 s/p chemo and radiation (last tx 4/09) s/p left mastectomy s/p 9 lymph node resection - [name], [place]  . CHF (congestive heart failure) 2010    Current Outpatient Prescriptions  Medication Sig Dispense Refill  . ferrous sulfate 325 (65 FE) MG tablet Take 325 mg by mouth daily with breakfast.      .  folic acid (FOLVITE) 1 MG tablet Take 1 mg by mouth 2 (two) times daily.      . hydrocodone-acetaminophen (LORCET PLUS) 7.5-650 MG per tablet Take 1 tablet by mouth every 6 (six) hours as needed for pain.      Marland Kitchen letrozole (FEMARA) 2.5 MG tablet Take 2.5 mg by mouth daily.      Marland Kitchen levothyroxine (SYNTHROID, LEVOTHROID) 137 MCG tablet Take 137 mcg by mouth daily.       Marland Kitchen lisinopril (PRINIVIL,ZESTRIL) 5 MG tablet Take 2.5 mg by mouth daily.      Marland Kitchen LORazepam (ATIVAN) 2 MG tablet Take 2 mg by mouth at bedtime as needed for anxiety.      . metoprolol tartrate (LOPRESSOR) 25 MG tablet Take 1 tablet (25 mg total) by mouth 2 (two) times daily.  60 tablet  0  . mirtazapine (REMERON) 15 MG tablet Take 15 mg by mouth at bedtime.       . nitroGLYCERIN (NITROSTAT) 0.3 MG SL tablet Place 0.3 mg under the tongue every 5 (five) minutes as needed  for chest pain.      . pantoprazole (PROTONIX) 40 MG tablet Take 40 mg by mouth every evening.      . potassium chloride SA (K-DUR,KLOR-CON) 20 MEQ tablet Take 20 mEq by mouth daily.       . rosuvastatin (CRESTOR) 5 MG tablet Take 5 mg by mouth daily.      Marland Kitchen spironolactone (ALDACTONE) 25 MG tablet Take 0.5 tablets (12.5 mg total) by mouth daily.  30 tablet  6  . temazepam (RESTORIL) 30 MG capsule Take 30 mg by mouth at bedtime as needed for sleep.      Marland Kitchen torsemide (DEMADEX) 20 MG tablet Take 20 mg by mouth daily.      Marland Kitchen VITAMIN B1-B12 IJ Inject 1 mL into the skin every 30 (thirty) days.       No current facility-administered medications for this encounter.    Filed Vitals:   02/17/14 1054  BP: 123/64  Pulse: 71  Resp: 18  Weight: 154 lb 6 oz (70.024 kg)  SpO2: 98%   PHYSICAL EXAM: General:  Chronically ill appearing. No acute distress.No resp difficulty; daughter present. Ambulated into the clinic with a walker.  HEENT: normal Neck: supple. JVP ~8-9 with +CV waves. Carotids 2+ bilaterally; no bruits. No lymphadenopathy or thryomegaly appreciated. Cor: PMI normal. regular rate & rhythm. No rubs, gallops or 3/6 TR. soft P2. 2/6 AS murmur. +s3 Lungs:  clear Abdomen: soft, obese,  nontender, mildly distended.No bruits or masses. Good bowel sounds. Extremities: R and L fingers cyanotic. No clubbing, LE edema 1+  Neuro: alert & orientedx3, cranial nerves grossly intact. Moves all 4 extremities w/o difficulty. Affect pleasant.   ASSESSMENT & PLAN:  1) Chronic diastolic HF: EF 69-48% (03/4626) (R>L)  - NYHA II symptoms and volume status stable. Will continue torsemide 20 mg daily with 20 meq of potassium. Instructed if weight at home over 151 lbs to take an afternoon torsemide with an extra potassium. - BP stable. - Reinforced the need and importance of daily weights, a low sodium diet, and fluid restriction (less than 2 L a day). Instructed to call the HF clinic if weight increases more  than 3 lbs overnight or 5 lbs in a week.  2) Chest Pain - Last cath 2012 with minimal CAD. Last week 2 episodes of CP with nausea and diaphoresis, no changes in EKG. - Will order Lexiscan myoview 3) Atrial Fibrillation -  A paced 70 bpm. Denies palpitations. No Coumadin with falls   Rande Brunt, NP-C 11:13 AM

## 2014-02-18 NOTE — Addendum Note (Signed)
Encounter addended by: Evalee Mutton, CCT on: 02/18/2014  8:50 AM<BR>     Documentation filed: Charges VN

## 2014-03-03 ENCOUNTER — Ambulatory Visit (HOSPITAL_COMMUNITY): Payer: Medicare Other | Attending: Internal Medicine | Admitting: Radiology

## 2014-03-03 VITALS — BP 125/73 | HR 71 | Ht 62.0 in | Wt 154.0 lb

## 2014-03-03 DIAGNOSIS — I5032 Chronic diastolic (congestive) heart failure: Secondary | ICD-10-CM | POA: Insufficient documentation

## 2014-03-03 DIAGNOSIS — R0602 Shortness of breath: Secondary | ICD-10-CM

## 2014-03-03 DIAGNOSIS — R110 Nausea: Secondary | ICD-10-CM | POA: Insufficient documentation

## 2014-03-03 DIAGNOSIS — R109 Unspecified abdominal pain: Secondary | ICD-10-CM | POA: Insufficient documentation

## 2014-03-03 DIAGNOSIS — R079 Chest pain, unspecified: Secondary | ICD-10-CM

## 2014-03-03 MED ORDER — TECHNETIUM TC 99M SESTAMIBI GENERIC - CARDIOLITE
30.0000 | Freq: Once | INTRAVENOUS | Status: AC | PRN
  Administered 2014-03-03: 30 via INTRAVENOUS

## 2014-03-03 MED ORDER — AMINOPHYLLINE 25 MG/ML IV SOLN
75.0000 mg | Freq: Once | INTRAVENOUS | Status: AC
  Administered 2014-03-03: 75 mg via INTRAVENOUS

## 2014-03-03 MED ORDER — TECHNETIUM TC 99M SESTAMIBI GENERIC - CARDIOLITE
10.0000 | Freq: Once | INTRAVENOUS | Status: AC | PRN
  Administered 2014-03-03: 10 via INTRAVENOUS

## 2014-03-03 MED ORDER — REGADENOSON 0.4 MG/5ML IV SOLN
0.4000 mg | Freq: Once | INTRAVENOUS | Status: AC
  Administered 2014-03-03: 0.4 mg via INTRAVENOUS

## 2014-03-03 NOTE — Progress Notes (Signed)
[place] [address] [place],  [address] [phone]    Cardiology Nuclear Med Study  [name] is a 73 y.o. female     MRN : [id number]     DOB: 1941/05/02  Procedure Date: 03/03/2014  Nuclear Med Background Indication for Stress Test:  Evaluation for Ischemia and [facility]  01-2014 in [place]- CP/dehydration History: AICD, AFIB, 7 yrs ago Myocardial Perfusion Imaging-Normal (in [place]), and 03-27-2013 Echo: EF=55-60% Cardiac Risk Factors: CVA, Family History - CAD, Hypertension, Lipids and NIDDM  Symptoms: Chest Pressure with/without exertion (last occurrence 3 or 4 days ago),DOE and SOB   Nuclear Pre-Procedure Caffeine/Decaff Intake:  None NPO After: 7:00am   Lungs:  clear O2 Sat: 98% on room air. IV 0.9% NS with Angio Cath:  24g  IV Site: R Hand  IV Started by:  Crissie Figures, RN  Chest Size (in):  36 Cup Size: B/Left Mastectomy  Height: 5\' 2"  (1.575 m)  Weight:  154 lb (69.854 kg)  BMI:  Body mass index is 28.16 kg/(m^2). Tech Comments:  Patient took Lopressor this am. Irven Baltimore, RN.    Nuclear Med Study 1 or 2 day study: 1 day  Stress Test Type:  Lexiscan  Reading MD: N/A  Order Authorizing Provider:  Glori Bickers, MD  Resting Radionuclide: Technetium 10m Sestamibi  Resting Radionuclide Dose: 11.0 mCi   Stress Radionuclide:  Technetium 13m Sestamibi  Stress Radionuclide Dose: 32.7 mCi           Stress Protocol Rest HR: 71 Stress HR: 75  Rest BP: 125/73 Stress BP: 126/59  Exercise Time (min): n/a METS: n/a   Predicted Max HR: 150 bpm % Max HR: 50 bpm Rate Pressure Product: 9450   Dose of Adenosine (mg):  n/a Dose of Lexiscan: 0.4 mg  Dose of Atropine (mg): n/a Dose of Dobutamine: n/a mcg/kg/min (at max HR)  Stress Test Technologist: Irven Baltimore, RN  Nuclear Technologist:  Charlton Amor, CNMT     Rest Procedure:  Myocardial perfusion imaging was performed at rest 45 minutes following the  intravenous administration of Technetium 49m Sestamibi. Rest ECG: NSR - Normal EKG and NSR, RBBB, LAFB  Stress Procedure:  The patient received IV Lexiscan 0.4 mg over 15-seconds.  Technetium 29m Sestamibi injected at 30-seconds. The patient complained of dizziness, nausea and stomach pain with Lexiscan. Quantitative spect images were obtained after a 45 minute delay. Aminophylline 75 mg IVP given 7 minutes post Lexiscan due to persistent Nausea, and stomach pain with quick improvement of symptoms.  Stress ECG: No significant ST segment change suggestive of ischemia.  QPS Raw Data Images:  Acquisition technically good; normal left ventricular size. Stress Images:  There is decreased uptake in the anterior and inferior walls. Rest Images:  There is decreased uptake in the anterior and inferior walls. Subtraction (SDS):  No evidence of ischemia. Transient Ischemic Dilatation (Normal <1.22):  0.96 Lung/Heart Ratio (Normal <0.45):  0.35  Quantitative Gated Spect Images QGS EDV:  65 ml QGS ESV:  26 ml  Impression Exercise Capacity:  Lexiscan with no exercise. BP Response:  Normal blood pressure response. Clinical Symptoms:  There is dyspnea. ECG Impression:  No significant ST segment change suggestive of ischemia. Comparison with Prior Nuclear Study: No images to compare  Overall Impression:  Low risk stress nuclear study with small, mild, fixed anterior and inferior defects consistent with soft tissue attenuation; no ischemia.  LV Ejection Fraction: 61%.  LV Wall Motion:  NL LV Function; NL Wall Motion  [name]

## 2014-03-28 ENCOUNTER — Telehealth (HOSPITAL_COMMUNITY): Payer: Self-pay | Admitting: Cardiology

## 2014-03-28 ENCOUNTER — Telehealth (HOSPITAL_COMMUNITY): Payer: Self-pay

## 2014-03-28 NOTE — Telephone Encounter (Signed)
Daughter called to report weight gain Pt did not report weight gain to daughter x 1 week  Weight normally 144 today weight 159 Decreased urine out put, increased edema Pt did not increase/double diuretics until 5/17 Should she take a metolazone?  Please advise  P.S. Would also like stress test results

## 2014-03-28 NOTE — Telephone Encounter (Signed)
Patient's daughter called back concerning patient's weight gain.  States her mother has gained 15-20 lbs over past 2-3 wks and has not told anyone about it until today.  Says patient started doubling her fluid pills (was taking torsemide 20 once daily, now has been taking 20 twice daily) and all the sudden is not able to pee.  Concerned for kidney function, put on schedule for tomorrow to be seen and have lab work done.  Instructed to take metolazone to see if that helps in the mean time.  Instructed to seek emergency services if patient continues to gain weight or show worsening s/s of CHF.  Aware and agreeable. Renee Pain

## 2014-03-29 ENCOUNTER — Encounter (HOSPITAL_COMMUNITY): Payer: Self-pay

## 2014-03-29 ENCOUNTER — Ambulatory Visit (HOSPITAL_COMMUNITY)
Admission: RE | Admit: 2014-03-29 | Discharge: 2014-03-29 | Disposition: A | Discharge status: Home / Self Care | Payer: Medicare Other | Source: Ambulatory Visit | Attending: Internal Medicine | Admitting: Internal Medicine

## 2014-03-29 VITALS — BP 112/62 | HR 71 | Wt 157.4 lb

## 2014-03-29 DIAGNOSIS — Z952 Presence of prosthetic heart valve: Secondary | ICD-10-CM

## 2014-03-29 DIAGNOSIS — I5032 Chronic diastolic (congestive) heart failure: Secondary | ICD-10-CM | POA: Insufficient documentation

## 2014-03-29 DIAGNOSIS — I079 Rheumatic tricuspid valve disease, unspecified: Secondary | ICD-10-CM

## 2014-03-29 DIAGNOSIS — Z954 Presence of other heart-valve replacement: Secondary | ICD-10-CM

## 2014-03-29 DIAGNOSIS — I509 Heart failure, unspecified: Secondary | ICD-10-CM

## 2014-03-29 DIAGNOSIS — I5082 Biventricular heart failure: Secondary | ICD-10-CM

## 2014-03-29 DIAGNOSIS — I071 Rheumatic tricuspid insufficiency: Secondary | ICD-10-CM

## 2014-03-29 LAB — BASIC METABOLIC PANEL
BUN: 20 mg/dL (ref 6–23)
CHLORIDE: 103 meq/L (ref 96–112)
CO2: 22 mEq/L (ref 19–32)
Calcium: 7.7 mg/dL — ABNORMAL LOW (ref 8.4–10.5)
Creatinine, Ser: 1.73 mg/dL — ABNORMAL HIGH (ref 0.50–1.10)
GFR calc Af Amer: 33 mL/min — ABNORMAL LOW (ref >90)
GFR, EST NON AFRICAN AMERICAN: 29 mL/min — AB (ref >90)
GLUCOSE: 122 mg/dL — AB (ref 70–99)
POTASSIUM: 5 meq/L (ref 3.7–5.3)
SODIUM: 139 meq/L (ref 137–147)

## 2014-03-29 LAB — PRO B NATRIURETIC PEPTIDE: Pro B Natriuretic peptide (BNP): 2862 pg/mL — ABNORMAL HIGH (ref 0–125)

## 2014-03-29 MED ORDER — POTASSIUM CHLORIDE CRYS ER 20 MEQ PO TBCR: EXTENDED_RELEASE_TABLET | ORAL | Status: DC

## 2014-03-29 MED ORDER — POTASSIUM CHLORIDE CRYS ER 20 MEQ PO TBCR: 20.0000 meq | EXTENDED_RELEASE_TABLET | Freq: Two times a day (BID) | ORAL | Status: DC

## 2014-03-29 MED ORDER — TORSEMIDE 20 MG PO TABS
40.0000 mg | ORAL_TABLET | Freq: Two times a day (BID) | ORAL | Status: DC
Start: 2014-03-29 — End: 2014-04-01

## 2014-03-29 NOTE — Patient Instructions (Addendum)
Follow up next week  Take 40 meq in am and 20 meq potassium pm.    Take 40 mg torsemide twice a day   Do the following things EVERYDAY: 1) Weigh yourself in the morning before breakfast. Write it down and keep it in a log. 2) Take your medicines as prescribed 3) Eat low salt foods-Limit salt (sodium) to 2000 mg per day.  4) Stay as active as you can everyday 5) Limit all fluids for the day to less than 2 liters

## 2014-03-29 NOTE — Progress Notes (Signed)
Patient ID: [name], female   DOB: [birth year], 73 y.o.   MRN: [id number]  PCP: Dr Jerolyn Center EP: Dr Lovena Le  HPI: [name] is a 73 y.o.female with h/o DM2, HTN, L breast CA s/p mastectomy/chemo/XRT 8/08, PUD s/p partial gastrectomy, rheumatic fever with MS/MR/TR with related PAH, CVA with transient R sided weakness 12/11, CKD. Status post bioprosthetic mitral valve replacement, tricuspid valve repair, and Maze procedure on September 24, 2011 (pre-op cath minimal CAD). Coumadin stopped due to falls. 03/31/13 S/P DDD ICD. Has not been able to tolerate Revatio in past.  Admitted to [facility] 03/26/13 through 04/04/13  had V fib arrest in setting of hypokalemia. Then had multiple episodes or recurrent VT despite correction of electrolyte abnormalities. Seen by Dr. Caryl [name] and received ICD.   08/12/12 RHC RA = 15 v= 21  RV = 64/3/13  PA = 59/16 (33)  PCW = 14  Fick cardiac output/index = 4.1/2.4  PVR = 7.8 Woods  FA sat = 98%  PA sat = 60%, 63%   She returns for follow up. She reports increased dyspnea on exertion. Weight at home trending up 144 to 159 pounds. Yesterday she was instructed to take a metolazone and an extra torsemide says she had poor response. +orthopnea sleeping on 3 pillows. She is taking percocet 4 times a day for back pain. Wearing  2 liters oxygen at night. Says she is taking all medications.   Optivol was checked today.  Fluid index was well above threshold and thoracic impedence was trending down.   Myoview 02/2014 -Low risk stress nuclear study with small, mild, fixed anterior and inferior defects consistent with soft tissue attenuation; no ischemia. LV Ejection Fraction: 61%. LV Wall Motion: NL LV Function; NL Wall Motion   02/17/13 Echo  RV dilated but normal systolic function.  LVEF 50%. Moderate to severe TR  Septal bounce RVSP 55-60. IVC small 03/27/13 Echo  EF 55-60%. Moderate to severe TR, PASP 46  Labs (7/14): Creatinine 1.15, K+4.0 Labs (09/28/13): K 4.5 Creatinine 1.02   Labs (11/16/12): K 5.2 Creatine 1.22  Labs (3/15): K 4.2, creatinine 1.18, LDL 103, HDL 34  ECG: Regular rhythm, ?NSR with low voltage P's, RBBB, LAFB  ROS: All systems negative except as listed in HPI, PMH and Problem List.  Past Medical History  Diagnosis Date  . Pulmonary hypertension     s/p RHC 4/10 - PAP 86/29, wedge 21-23  . Gastric ulcer with hemorrhage   . Rheumatic fever     Childhood  . Coronary artery disease     Nonobstructive, LVEF 55%  . Mitral regurgitation     Moderate  . 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 Mitral stenosis with insufficiency, rheumatic   . Hypertension   . Heart murmur   . Stroke     12/11 [name],LEFT SIDE WEAKNESS  . Shortness of breath     EXERTION,PT. STATES SHE HAS PULMONARY HTN  . Pneumonia 1990'S     SEVERAL EPISODES  . Asthma     PT. STATES MILD,USES INHALERS PRN,  . Asthma     PT. ON OXYGEN 2 L/[place]   . Diabetes mellitus   . Hypothyroidism   . Anemia, pernicious 1979  . Hepatitis     PT. STATES SHE HAD IT WHEN SHE WAS 14 YRS. OLD  . Blood transfusion 2008  . Arthritis   . GERD (gastroesophageal reflux disease)   . Fibromyalgia   . Squamous cell carcinoma  Left arm  . Breast cancer     2008 s/p chemo and radiation (last tx 4/09) s/p left mastectomy s/p 9 lymph node resection - [name], [name]  . CHF (congestive heart failure) 2010    Current Outpatient Prescriptions  Medication Sig Dispense Refill  . ferrous sulfate 325 (65 FE) MG tablet Take 325 mg by mouth daily with breakfast.      . folic acid (FOLVITE) 1 MG tablet Take 1 mg by mouth 2 (two) times daily.      . hydrocodone-acetaminophen (LORCET PLUS) 7.5-650 MG per tablet Take 1 tablet by mouth every 6 (six) hours as needed for pain.      Marland Kitchen letrozole (FEMARA) 2.5 MG tablet Take 2.5 mg by mouth daily.      Marland Kitchen levothyroxine (SYNTHROID, LEVOTHROID) 137 MCG tablet Take 137 mcg by mouth daily.       Marland Kitchen lisinopril (PRINIVIL,ZESTRIL) 5 MG tablet  Take 2.5 mg by mouth daily.      Marland Kitchen LORazepam (ATIVAN) 2 MG tablet Take 2 mg by mouth at bedtime as needed for anxiety.      . metoprolol tartrate (LOPRESSOR) 25 MG tablet Take 1 tablet (25 mg total) by mouth 2 (two) times daily.  60 tablet  0  . mirtazapine (REMERON) 15 MG tablet Take 15 mg by mouth at bedtime.       . nitroGLYCERIN (NITROSTAT) 0.3 MG SL tablet Place 0.3 mg under the tongue every 5 (five) minutes as needed for chest pain.      . pantoprazole (PROTONIX) 40 MG tablet Take 40 mg by mouth every evening.      . potassium chloride SA (K-DUR,KLOR-CON) 20 MEQ tablet Take 20 mEq by mouth daily.       . rosuvastatin (CRESTOR) 5 MG tablet Take 5 mg by mouth daily.      Marland Kitchen spironolactone (ALDACTONE) 25 MG tablet Take 0.5 tablets (12.5 mg total) by mouth daily.  30 tablet  6  . temazepam (RESTORIL) 30 MG capsule Take 30 mg by mouth at bedtime as needed for sleep.      Marland Kitchen torsemide (DEMADEX) 20 MG tablet Take 20 mg by mouth daily.      Marland Kitchen VITAMIN B1-B12 IJ Inject 1 mL into the skin every 30 (thirty) days.       No current facility-administered medications for this encounter.    Filed Vitals:   03/29/14 1540  BP: 112/62  Pulse: 71  Weight: 157 lb 6.4 oz (71.396 kg)  SpO2: 100%   PHYSICAL EXAM: General:  Chronically ill appearing. No acute distress.No resp difficulty; daughter present. Ambulated into the clinic with a walker.  HEENT: normal Neck: supple. JVP ~to jaw with +CV waves. Carotids 2+ bilaterally; no bruits. No lymphadenopathy or thryomegaly appreciated. Cor: PMI normal. regular rate & rhythm. No rubs, gallops or 3/6 TR. soft P2. 2/6 AS murmur. Right-sided S3.  Lungs:  clear Abdomen: soft, obese,  nontender, mildly distended.No bruits or masses. Good bowel sounds. Extremities: R and L fingers cyanotic. No clubbing, LE edema 2+ edema 1/2 to knees bilaterally. Neuro: alert & orientedx3, cranial nerves grossly intact. Moves all 4 extremities w/o difficulty. Affect pleasant.    ASSESSMENT & PLAN:  1) Chronic diastolic HF: EF 02-54% with moderate to severe TR (03/2013 echo).  I suspect that there is a prominent component of RV failure in the face of probably severe TR.  NYHA IIIb symptoms. Optivol showed fluid index well above threshold.  No VT/AF. Volume status elevated. -  Increase torsemide to 40 mg twice a day and bring her back next week to reassess. Increase potassium to 40 qam, 20 qpm.  - BMET/BNP today and in 1 week at followup.  - Reinforced the need and importance of daily weights, a low sodium diet, and fluid restriction (less than 2 L a day). Instructed to call the HF clinic if weight increases more than 3 lbs overnight or 5 lbs in a week.  2) Chest Pain: Atypical.  Last cath 2012 with minimal CAD.  3) Atrial Fibrillation: Paroxysmal, s/p Maze. She is not in atrial fibrillation today.  4) Tricuspid regurgitation: I am concerned that the patient could be developing RV failure in the setting of severe TR.  She will need to be set up for an echo at next appointment to reassess RV and TR.  She would be high-risk for re-do sternotomy.  However, if medical treatment becomes very difficult or RV looks bad, would consider referring back to CVTS for surgical evaluation.   5) Mitral valve disease: Patient had bioprosthetic MV replacement.  MV looked ok on last echo.   Follow up next week. Check BMET and pro bnp next week.    Amy Estrella Deeds, NP-C 3:41 PM  Patient seen with NP, agree with the above note.  I am concerned that she is developing worsening RV failure in the setting of possibly severe TR.  She is volume overloaded on exam with NYHA class IIIb symptoms.  I will have her increase torsemide to 40 mg bid as above. Follow labs closely.  Set up repeat echo at next appointment, will need to consider re-evaluation by CVTS if RV worse and TR severe.   Larey Dresser 03/29/2014

## 2014-03-30 ENCOUNTER — Encounter (HOSPITAL_COMMUNITY): Payer: Medicare Other

## 2014-03-31 ENCOUNTER — Observation Stay (HOSPITAL_COMMUNITY)
Admission: AD | Admit: 2014-03-31 | Discharge: 2014-04-02 | Disposition: A | Discharge status: Home / Self Care | Payer: Medicare Other | Source: Other Acute Inpatient Hospital | Attending: Internal Medicine | Admitting: Internal Medicine

## 2014-03-31 DIAGNOSIS — G35 Multiple sclerosis: Secondary | ICD-10-CM | POA: Insufficient documentation

## 2014-03-31 DIAGNOSIS — I2789 Other specified pulmonary heart diseases: Secondary | ICD-10-CM | POA: Insufficient documentation

## 2014-03-31 DIAGNOSIS — Z853 Personal history of malignant neoplasm of breast: Secondary | ICD-10-CM | POA: Insufficient documentation

## 2014-03-31 DIAGNOSIS — I2510 Atherosclerotic heart disease of native coronary artery without angina pectoris: Secondary | ICD-10-CM | POA: Insufficient documentation

## 2014-03-31 DIAGNOSIS — R079 Chest pain, unspecified: Secondary | ICD-10-CM

## 2014-03-31 DIAGNOSIS — R29898 Other symptoms and signs involving the musculoskeletal system: Secondary | ICD-10-CM | POA: Insufficient documentation

## 2014-03-31 DIAGNOSIS — Z923 Personal history of irradiation: Secondary | ICD-10-CM | POA: Insufficient documentation

## 2014-03-31 DIAGNOSIS — R011 Cardiac murmur, unspecified: Secondary | ICD-10-CM | POA: Insufficient documentation

## 2014-03-31 DIAGNOSIS — I5033 Acute on chronic diastolic (congestive) heart failure: Principal | ICD-10-CM | POA: Insufficient documentation

## 2014-03-31 DIAGNOSIS — I079 Rheumatic tricuspid valve disease, unspecified: Secondary | ICD-10-CM | POA: Insufficient documentation

## 2014-03-31 DIAGNOSIS — I10 Essential (primary) hypertension: Secondary | ICD-10-CM | POA: Insufficient documentation

## 2014-03-31 DIAGNOSIS — Z954 Presence of other heart-valve replacement: Secondary | ICD-10-CM | POA: Insufficient documentation

## 2014-03-31 DIAGNOSIS — IMO0001 Reserved for inherently not codable concepts without codable children: Secondary | ICD-10-CM | POA: Insufficient documentation

## 2014-03-31 DIAGNOSIS — J45909 Unspecified asthma, uncomplicated: Secondary | ICD-10-CM | POA: Insufficient documentation

## 2014-03-31 DIAGNOSIS — R51 Headache: Secondary | ICD-10-CM | POA: Insufficient documentation

## 2014-03-31 DIAGNOSIS — I5082 Biventricular heart failure: Secondary | ICD-10-CM | POA: Diagnosis present

## 2014-03-31 DIAGNOSIS — E039 Hypothyroidism, unspecified: Secondary | ICD-10-CM | POA: Insufficient documentation

## 2014-03-31 DIAGNOSIS — D649 Anemia, unspecified: Secondary | ICD-10-CM | POA: Insufficient documentation

## 2014-03-31 DIAGNOSIS — E876 Hypokalemia: Secondary | ICD-10-CM | POA: Insufficient documentation

## 2014-03-31 DIAGNOSIS — Z9221 Personal history of antineoplastic chemotherapy: Secondary | ICD-10-CM | POA: Insufficient documentation

## 2014-03-31 DIAGNOSIS — Z9010 Acquired absence of unspecified breast and nipple: Secondary | ICD-10-CM | POA: Insufficient documentation

## 2014-03-31 DIAGNOSIS — I469 Cardiac arrest, cause unspecified: Secondary | ICD-10-CM

## 2014-03-31 DIAGNOSIS — K219 Gastro-esophageal reflux disease without esophagitis: Secondary | ICD-10-CM | POA: Insufficient documentation

## 2014-03-31 DIAGNOSIS — Z95810 Presence of automatic (implantable) cardiac defibrillator: Secondary | ICD-10-CM | POA: Insufficient documentation

## 2014-03-31 DIAGNOSIS — Z9181 History of falling: Secondary | ICD-10-CM | POA: Insufficient documentation

## 2014-03-31 DIAGNOSIS — I059 Rheumatic mitral valve disease, unspecified: Secondary | ICD-10-CM | POA: Insufficient documentation

## 2014-03-31 DIAGNOSIS — Z85828 Personal history of other malignant neoplasm of skin: Secondary | ICD-10-CM | POA: Insufficient documentation

## 2014-03-31 DIAGNOSIS — I509 Heart failure, unspecified: Secondary | ICD-10-CM | POA: Insufficient documentation

## 2014-03-31 DIAGNOSIS — I4891 Unspecified atrial fibrillation: Secondary | ICD-10-CM

## 2014-03-31 DIAGNOSIS — I071 Rheumatic tricuspid insufficiency: Secondary | ICD-10-CM | POA: Diagnosis present

## 2014-03-31 DIAGNOSIS — R570 Cardiogenic shock: Secondary | ICD-10-CM

## 2014-03-31 DIAGNOSIS — I69998 Other sequelae following unspecified cerebrovascular disease: Secondary | ICD-10-CM | POA: Insufficient documentation

## 2014-03-31 DIAGNOSIS — E119 Type 2 diabetes mellitus without complications: Secondary | ICD-10-CM | POA: Insufficient documentation

## 2014-03-31 LAB — CBC
HCT: 32.2 % — ABNORMAL LOW (ref 36.0–46.0)
Hemoglobin: 10.5 g/dL — ABNORMAL LOW (ref 12.0–15.0)
MCH: 32.2 pg (ref 26.0–34.0)
MCHC: 32.6 g/dL (ref 30.0–36.0)
MCV: 98.8 fL (ref 78.0–100.0)
PLATELETS: 166 10*3/uL (ref 150–400)
RBC: 3.26 MIL/uL — ABNORMAL LOW (ref 3.87–5.11)
RDW: 13.7 % (ref 11.5–15.5)
WBC: 9.3 10*3/uL (ref 4.0–10.5)

## 2014-03-31 MED ORDER — ONDANSETRON HCL 4 MG/2ML IJ SOLN
4.0000 mg | Freq: Four times a day (QID) | INTRAMUSCULAR | Status: DC | PRN
  Administered 2014-03-31 – 2014-04-01 (×3): 4 mg via INTRAVENOUS
  Filled 2014-03-31 (×3): qty 2

## 2014-03-31 MED ORDER — PANTOPRAZOLE SODIUM 40 MG PO TBEC
40.0000 mg | DELAYED_RELEASE_TABLET | Freq: Every evening | ORAL | Status: DC
  Administered 2014-03-31 – 2014-04-01 (×2): 40 mg via ORAL
  Filled 2014-03-31 (×2): qty 1

## 2014-03-31 MED ORDER — HEPARIN SODIUM (PORCINE) 5000 UNIT/ML IJ SOLN
5000.0000 [IU] | Freq: Three times a day (TID) | INTRAMUSCULAR | Status: DC
  Administered 2014-03-31 – 2014-04-02 (×5): 5000 [IU] via SUBCUTANEOUS
  Filled 2014-03-31 (×7): qty 1

## 2014-03-31 MED ORDER — SODIUM CHLORIDE 0.9 % IV SOLN: 250.0000 mL | INTRAVENOUS | Status: DC | PRN

## 2014-03-31 MED ORDER — MIRTAZAPINE 15 MG PO TABS
15.0000 mg | ORAL_TABLET | Freq: Every day | ORAL | Status: DC
  Administered 2014-03-31 – 2014-04-01 (×2): 15 mg via ORAL
  Filled 2014-03-31 (×3): qty 1

## 2014-03-31 MED ORDER — SODIUM CHLORIDE 0.9 % IJ SOLN
3.0000 mL | Freq: Two times a day (BID) | INTRAMUSCULAR | Status: DC
  Administered 2014-03-31 – 2014-04-01 (×3): 3 mL via INTRAVENOUS

## 2014-03-31 MED ORDER — SPIRONOLACTONE 12.5 MG HALF TABLET
12.5000 mg | ORAL_TABLET | Freq: Every day | ORAL | Status: DC
  Administered 2014-04-01 – 2014-04-02 (×2): 12.5 mg via ORAL
  Filled 2014-03-31 (×2): qty 1

## 2014-03-31 MED ORDER — ACETAMINOPHEN 325 MG PO TABS: 650.0000 mg | ORAL_TABLET | ORAL | Status: DC | PRN

## 2014-03-31 MED ORDER — TEMAZEPAM 15 MG PO CAPS
30.0000 mg | ORAL_CAPSULE | Freq: Once | ORAL | Status: AC
  Administered 2014-04-01: 30 mg via ORAL
  Filled 2014-03-31: qty 2

## 2014-03-31 MED ORDER — FOLIC ACID 1 MG PO TABS
1.0000 mg | ORAL_TABLET | Freq: Two times a day (BID) | ORAL | Status: DC
  Administered 2014-03-31 – 2014-04-02 (×4): 1 mg via ORAL
  Filled 2014-03-31 (×5): qty 1

## 2014-03-31 MED ORDER — OXYCODONE-ACETAMINOPHEN 5-325 MG PO TABS
1.0000 | ORAL_TABLET | Freq: Four times a day (QID) | ORAL | Status: DC | PRN
  Administered 2014-03-31 – 2014-04-02 (×5): 1 via ORAL
  Filled 2014-03-31 (×5): qty 1

## 2014-03-31 MED ORDER — OXYCODONE-ACETAMINOPHEN 10-325 MG PO TABS: 1.0000 | ORAL_TABLET | Freq: Four times a day (QID) | ORAL | Status: DC | PRN

## 2014-03-31 MED ORDER — TORSEMIDE 20 MG PO TABS
40.0000 mg | ORAL_TABLET | Freq: Two times a day (BID) | ORAL | Status: DC
  Administered 2014-04-01: 40 mg via ORAL
  Filled 2014-03-31 (×3): qty 2

## 2014-03-31 MED ORDER — LORAZEPAM 1 MG PO TABS
2.0000 mg | ORAL_TABLET | Freq: Every evening | ORAL | Status: DC | PRN
  Administered 2014-03-31 – 2014-04-01 (×2): 2 mg via ORAL
  Filled 2014-03-31 (×2): qty 2

## 2014-03-31 MED ORDER — LISINOPRIL 2.5 MG PO TABS
2.5000 mg | ORAL_TABLET | Freq: Every day | ORAL | Status: DC
  Administered 2014-04-01 – 2014-04-02 (×2): 2.5 mg via ORAL
  Filled 2014-03-31 (×2): qty 1

## 2014-03-31 MED ORDER — METOPROLOL TARTRATE 25 MG PO TABS
25.0000 mg | ORAL_TABLET | Freq: Two times a day (BID) | ORAL | Status: DC
  Administered 2014-03-31 – 2014-04-02 (×4): 25 mg via ORAL
  Filled 2014-03-31 (×5): qty 1

## 2014-03-31 MED ORDER — OXYCODONE HCL 5 MG PO TABS
5.0000 mg | ORAL_TABLET | Freq: Four times a day (QID) | ORAL | Status: DC | PRN
  Administered 2014-04-01 – 2014-04-02 (×2): 5 mg via ORAL
  Filled 2014-03-31 (×3): qty 1

## 2014-03-31 MED ORDER — LEVOTHYROXINE SODIUM 137 MCG PO TABS
137.0000 ug | ORAL_TABLET | Freq: Every day | ORAL | Status: DC
  Administered 2014-04-01 – 2014-04-02 (×2): 137 ug via ORAL
  Filled 2014-03-31 (×3): qty 1

## 2014-03-31 MED ORDER — SODIUM CHLORIDE 0.9 % IJ SOLN: 3.0000 mL | INTRAMUSCULAR | Status: DC | PRN

## 2014-03-31 MED ORDER — FERROUS SULFATE 325 (65 FE) MG PO TABS
325.0000 mg | ORAL_TABLET | Freq: Every day | ORAL | Status: DC
  Filled 2014-03-31 (×2): qty 1

## 2014-03-31 MED ORDER — LETROZOLE 2.5 MG PO TABS
2.5000 mg | ORAL_TABLET | Freq: Every day | ORAL | Status: DC
  Administered 2014-04-01 – 2014-04-02 (×2): 2.5 mg via ORAL
  Filled 2014-03-31 (×2): qty 1

## 2014-03-31 NOTE — H&P (Signed)
Admission History and Physical     Patient ID: [name], MRN: [id number], DOB: June 12, 1941 73 y.o. 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 Status post bioprosthetic mitral valve replacement, tricuspid valve repair, and Maze procedure on September 24, 2011 (pre-op cath minimal CAD). Coumadin stopped due to falls. 03/31/13 S/P DDD ICD. Has not been able to tolerate Revatio in past.  Admitted to [facility] 03/26/13 through 04/04/13 had V fib arrest in setting of hypokalemia. Then had multiple episodes or recurrent VT despite correction of electrolyte abnormalities. Seen by Dr. Caryl Comes and received ICD.  08/12/12 RHC  RA = 15 v= 21  RV = 64/3/13  PA = 59/16 (33)  PCW = 14  Fick cardiac output/index = 4.1/2.4  PVR = 7.8 Woods  FA sat = 98%  PA sat = 60%, 63%   Was seen by Dr. Aundra Dubin 2 days ago with volume overload, torsemide was increased and she has had good response.  She says she has lost 10lbs.  Dr. Aundra Dubin was concerned for worsening R heart failure in the setting of severe TR and plan was for close follow up with repeat Echo.  Chest pain started last night at 2am.  She didn't sleep due to a bad headache.  Pain has continued for most of the day, described as a squeezing pain.  +N/V Presented to [facility].  EKG with RBBB and LAFB but no evidence of ischemia.    Labs: WBC 8.7 Hgb 11.1 PLT 200 Na 138 K 4.2 Co2 23 BUN 25 Cr 1.7 Trop I 1.3 CKMB 1.3   In ED was given ASA 324 and 1mg /kg of Lovenox.  Also methypred and morphine.  Chest pain is currently better but still present, asking for morphine.       Past  Medical History  Diagnosis Date  . Pulmonary hypertension     s/p RHC 4/10 - PAP 86/29, wedge 21-23  . Gastric ulcer with hemorrhage   . Rheumatic fever     Childhood  . Coronary artery disease     Nonobstructive, LVEF 55%  . Mitral regurgitation     Moderate  . 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 Mitral stenosis with insufficiency, rheumatic   . Hypertension   . Heart murmur   . Stroke     12/11 [place],LEFT SIDE WEAKNESS  . Shortness of breath     EXERTION,PT. STATES SHE HAS PULMONARY HTN  . Pneumonia 1990'S     SEVERAL EPISODES  . Asthma     PT. STATES MILD,USES INHALERS PRN,  . Asthma     PT. ON OXYGEN 2 L/[place]   . Diabetes mellitus   . Hypothyroidism   . Anemia, pernicious 1979  . Hepatitis     PT. STATES SHE HAD IT WHEN SHE WAS 14 YRS. OLD  . Blood transfusion 2008  . Arthritis   . GERD (gastroesophageal reflux disease)   . Fibromyalgia   . Squamous cell carcinoma     Left arm  . Breast cancer     2008 s/p chemo and radiation (last tx 4/09) s/p left mastectomy s/p 9 lymph node resection -  [name], [place]  . CHF (congestive heart failure) 2010     Past Surgical History  Procedure Laterality Date  . Left mastectomy    . Partial gastrectomy    . Tonsillectomy    . Cardiac catheterization  07/2011  . Mastectomy  2008    LEFT  . Maze  09/24/2011    Procedure: MAZE (complete biatrial lesion set using RF and cryo with clipping of LA appendage);  Surgeon: Rexene Alberts, MD;  Location: [place];  Service: Open Heart Surgery;  Laterality: N/A;  . Mitral valve replacement  09/24/2011    Procedure: MITRAL VALVE (MV) REPLACEMENT (#64mm Medtronic Mosaic bioprosthetic tissue valve);  Surgeon: Rexene Alberts, MD;  Location: [place];  Service: Open Heart Surgery;  Laterality: N/A;  . Tricuspid valvuloplasty  09/24/2011    #26mm Edwards mc3 ring annuloplasty  . Cardiac defibrillator placement  03/31/13    MDT Evalyn Casco DR implanted by Dr Lovena Le for  secondary prevention      Current Facility-Administered Medications  Medication Dose Route Frequency Provider Last Rate Last Dose  . 0.9 %  sodium chloride infusion  250 mL Intravenous PRN Stephani Police, MD      . acetaminophen (TYLENOL) tablet 650 mg  650 mg Oral Q4H PRN Stephani Police, MD      . Derrill Memo ON 04/01/2014] ferrous sulfate tablet 325 mg  325 mg Oral Q breakfast Stephani Police, MD      . folic acid (FOLVITE) tablet 1 mg  1 mg Oral BID Stephani Police, MD   1 mg at 03/31/14 2305  . heparin injection 5,000 Units  5,000 Units Subcutaneous 3 times per day Stephani Police, MD   5,000 Units at 03/31/14 2305  . [START ON 04/01/2014] letrozole [facility]) tablet 2.5 mg  2.5 mg Oral Daily Stephani Police, MD      . Derrill Memo ON 04/01/2014] levothyroxine (SYNTHROID, LEVOTHROID) tablet 137 mcg  137 mcg Oral QAC breakfast Stephani Police, MD      . Derrill Memo ON 04/01/2014] lisinopril (PRINIVIL,ZESTRIL) tablet 2.5 mg  2.5 mg Oral Daily Stephani Police, MD      . LORazepam (ATIVAN) tablet 2 mg  2 mg Oral QHS PRN Stephani Police, MD   2 mg at 03/31/14 2305  . metoprolol tartrate (LOPRESSOR) tablet 25 mg  25 mg Oral BID Stephani Police, MD   25 mg at 03/31/14 2305  . mirtazapine (REMERON) tablet 15 mg  15 mg Oral QHS Stephani Police, MD   15 mg at 03/31/14 2305  . ondansetron (ZOFRAN) injection 4 mg  4 mg Intravenous Q6H PRN Stephani Police, MD   4 mg at 03/31/14 2305  . oxyCODONE-acetaminophen (PERCOCET/ROXICET) 5-325 MG per tablet 1 tablet  1 tablet Oral Q6H PRN Jolaine Artist, MD   1 tablet at 03/31/14 2221   And  . oxyCODONE (Oxy IR/ROXICODONE) immediate release tablet 5 mg  5 mg Oral Q6H PRN Jolaine Artist, MD      . pantoprazole (PROTONIX) EC tablet 40 mg  40 mg Oral QPM Stephani Police, MD   40 mg at 03/31/14 2305  . sodium chloride 0.9 % injection 3 mL  3 mL Intravenous Q12H Stephani Police, MD   3 mL at 03/31/14 2306  . sodium chloride 0.9 % injection 3 mL  3 mL Intravenous PRN Stephani Police, MD      . Derrill Memo ON 04/01/2014]  spironolactone (ALDACTONE) tablet 12.5 mg  12.5 mg Oral Daily Stephani Police, MD      . [  START ON 04/01/2014] temazepam (RESTORIL) capsule 30 mg  30 mg Oral Once Jolaine Artist, MD      . Derrill Memo ON 04/01/2014] torsemide (DEMADEX) tablet 40 mg  40 mg Oral BID Stephani Police, MD          Allergies: Allergies  Allergen Reactions  . Codeine Nausea And Vomiting  . Butorphanol Tartrate     REACTION: migraines, nervous/agitated  . Lipitor [Atorvastatin Calcium] Diarrhea    nightmares  . Revatio [Sildenafil Citrate] Swelling     Social History:  The patient  reports that she has never smoked. She has never used smokeless tobacco. She reports that she does not drink alcohol or use illicit drugs.   Family History:  The patient's family history includes Colon cancer in her mother; Heart disease in her mother.   ROS:  Please see the history of present illness.   All other systems reviewed and negative.   Vital Signs: Blood pressure 120/67, pulse 87, temperature 99.1 F (37.3 C), temperature source Oral, resp. rate 18, height 5\' 2"  (1.575 m), weight 64.91 kg (143 lb 1.6 oz), SpO2 100.00%.  PHYSICAL EXAM: General:  Well nourished, well developed, in no acute distress HEENT: normal Lymph: no adenopathy Neck: JVP elevated past angle of jaw with large V waves. Endocrine:  No thryomegaly Vascular: No carotid bruits; FA pulses 2+ bilaterally without bruits Cardiac:  normal S1, S2; RRR; no murmur Lungs:  clear to auscultation bilaterally, no wheezing, rhonchi or rales Abd: soft, nontender, no hepatomegaly Ext: no edema Musculoskeletal:  No deformities, BUE and BLE strength normal and equal Skin: warm and dry Neuro:  CNs 2-12 intact, no focal abnormalities noted Psych:  Normal affect   EKG:   A-paced, RBBB with LAFB (stable)  Labs:   Lab Results  Component Value Date   WBC 14.1* 04/04/2013   HGB 10.6* 04/04/2013   HCT 32.8* 04/04/2013   MCV 85.4 04/04/2013   PLT 104* 04/04/2013    Recent  Labs Lab 03/29/14 1607  NA 139  K 5.0  CL 103  CO2 22  BUN 20  CREATININE 1.73*  CALCIUM 7.7*  GLUCOSE 122*   No results found for this basename: CKTOTAL, CKMB, TROPONINI,  in the last 72 hours No results found for this basename: CHOL, HDL, LDLCALC, TRIG    Radiology/Studies:  No results found.   ASSESSMENT AND PLAN:   1. Chest Pain - Will perform serial enzymes.  I think the likelihood for ACS is low and will hold on therapeutic anticoagulation. - asprin 81mg  - Echo ordered to reassess RV function - Her weight is down 13 lbs in 2 days.  Will hold her torsemide and reassess after echocardiogram.  2.  Severe TR with RV failure - Echo as above - continue lisinopril, metoprolol, spironolactone.    Signed,  Stephani Police, MD 03/31/2014, 3:41 AM

## 2014-04-01 DIAGNOSIS — I5033 Acute on chronic diastolic (congestive) heart failure: Principal | ICD-10-CM

## 2014-04-01 DIAGNOSIS — I369 Nonrheumatic tricuspid valve disorder, unspecified: Secondary | ICD-10-CM

## 2014-04-01 LAB — BASIC METABOLIC PANEL
BUN: 30 mg/dL — ABNORMAL HIGH (ref 6–23)
CALCIUM: 7.9 mg/dL — AB (ref 8.4–10.5)
CO2: 23 meq/L (ref 19–32)
CREATININE: 1.67 mg/dL — AB (ref 0.50–1.10)
Chloride: 101 mEq/L (ref 96–112)
GFR calc Af Amer: 35 mL/min — ABNORMAL LOW (ref >90)
GFR, EST NON AFRICAN AMERICAN: 30 mL/min — AB (ref >90)
Glucose, Bld: 191 mg/dL — ABNORMAL HIGH (ref 70–99)
Potassium: 4.3 mEq/L (ref 3.7–5.3)
Sodium: 140 mEq/L (ref 137–147)

## 2014-04-01 LAB — CK TOTAL AND CKMB (NOT AT ARMC)
CK, MB: 1.3 ng/mL (ref 0.3–4.0)
RELATIVE INDEX: 1.2 (ref 0.0–2.5)
Total CK: 112 U/L (ref 7–177)

## 2014-04-01 LAB — TROPONIN I: Troponin I: <0.3 ng/mL (ref <0.30)

## 2014-04-01 LAB — CREATININE, SERUM
Creatinine, Ser: 1.69 mg/dL — ABNORMAL HIGH (ref 0.50–1.10)
GFR calc non Af Amer: 30 mL/min — ABNORMAL LOW (ref >90)
GFR, EST AFRICAN AMERICAN: 34 mL/min — AB (ref >90)

## 2014-04-01 MED ORDER — ASPIRIN EC 81 MG PO TBEC
81.0000 mg | DELAYED_RELEASE_TABLET | Freq: Every day | ORAL | Status: DC
  Administered 2014-04-01 – 2014-04-02 (×2): 81 mg via ORAL
  Filled 2014-04-01 (×2): qty 1

## 2014-04-01 MED ORDER — FUROSEMIDE 10 MG/ML IJ SOLN
80.0000 mg | Freq: Two times a day (BID) | INTRAMUSCULAR | Status: DC
  Administered 2014-04-01: 80 mg via INTRAVENOUS
  Filled 2014-04-01 (×3): qty 8

## 2014-04-01 MED ORDER — FERROUS SULFATE 325 (65 FE) MG PO TABS
325.0000 mg | ORAL_TABLET | Freq: Every day | ORAL | Status: DC
  Administered 2014-04-01 – 2014-04-02 (×2): 325 mg via ORAL
  Filled 2014-04-01 (×3): qty 1

## 2014-04-01 MED ORDER — POTASSIUM CHLORIDE ER 10 MEQ PO TBCR
20.0000 meq | EXTENDED_RELEASE_TABLET | Freq: Two times a day (BID) | ORAL | Status: DC
  Administered 2014-04-01 – 2014-04-02 (×2): 20 meq via ORAL
  Filled 2014-04-01 (×3): qty 2

## 2014-04-01 NOTE — Evaluation (Signed)
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  Pt admitted 5/21 for chest pain which may be due to capsular distension of liver as pt with no signifcat CAD on pre-op cath, recent Myoview was fine , and CEs negative.   Clinical Impression  Patient evaluated by Physical Therapy with no further acute PT needs identified. All education has been completed and the patient has no further questions.  Pt ambulated in hallway with slight SOB however pt reports this is her baseline.  She states her SOB with activity has become worse and worse however she feels this will not improve due to her cardiac hx and she declines f/u therapy at this time.  See below for any follow-up Physial Therapy or equipment needs. PT is signing off. Thank you for this referral.     Follow Up Recommendations No PT follow up    Equipment Recommendations  None recommended by PT    Recommendations for Other Services       Precautions / Restrictions Precautions Precautions: Fall      Mobility  Bed Mobility Overal bed mobility: Modified Independent                Transfers Overall transfer level: Modified independent                  Ambulation/Gait Ambulation/Gait assistance: Modified independent (Device/Increase time);Supervision Ambulation Distance (Feet): 160 Feet Assistive device: Rolling walker (2 wheeled) Gait Pattern/deviations: Step-through pattern     General Gait Details: pt reports mild SOB, SpO2 97% room air, pt  feels mobility is her baseline, states SOB with activity is also her baseline  Stairs            Wheelchair Mobility    Modified Rankin (Stroke Patients Only)       Balance                                             Pertinent Vitals/Pain SpO2 100% at rest room air SpO2 97% room air during ambulation    Home Living Family/patient expects to be discharged to:: Private residence Living Arrangements: Spouse/significant other   Type of Home: House Home Access: Level entry     Home Layout: One level Home Equipment: Environmental consultant - 4 wheels      Prior Function Level of Independence: Independent with assistive device(s)         Comments: pt reports no assistive device in house however keeps rollator in car to use in community (states mostly to make her spouse and daughter happy)      Hand Dominance        Extremity/Trunk Assessment               Lower Extremity Assessment: Overall WFL for tasks assessed      Cervical / Trunk Assessment: Kyphotic;Other exceptions  Communication   Communication: No difficulties  Cognition Arousal/Alertness: Awake/alert Behavior During Therapy: WFL for tasks assessed/performed  Overall Cognitive Status: Within Functional Limits for tasks assessed                      General Comments      Exercises        Assessment/Plan    PT Assessment Patent does not need any further PT services  PT Diagnosis     PT Problem List    PT Treatment Interventions     PT Goals (Current goals can be found in the Care Plan section) Acute Rehab PT Goals PT Goal Formulation: No goals set, d/c therapy    Frequency     Barriers to discharge        Co-evaluation               End of Session   Activity Tolerance: Patient tolerated treatment well Patient left: in bed;with call bell/phone within reach      Functional Assessment Tool Used: clinical judgement Functional Limitation: Mobility:  Walking and moving around Mobility: Walking and Moving Around Current Status ([id number]): At least 1 percent but less than 20 percent impaired, limited or restricted Mobility: Walking and Moving Around Goal Status [phone]): 0 percent impaired, limited or restricted Mobility: Walking and Moving Around Discharge Status [phone]): 0 percent impaired, limited or restricted    Time: 1359-1411 PT Time Calculation (min): 12 min   Charges:   PT Evaluation $Initial PT Evaluation Tier I: 1 Procedure PT Treatments $Gait Training: 8-22 mins   PT G Codes:   Functional Assessment Tool Used: clinical judgement Functional Limitation: Mobility: Walking and moving around    [employer] 04/01/2014, 2:27 PM Carmelia Bake, PT, DPT 04/01/2014 Pager: [phone]

## 2014-04-01 NOTE — Progress Notes (Signed)
Advanced Heart Failure Rounding Note   Subjective:   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 Status post bioprosthetic mitral valve replacement, tricuspid valve repair, and Maze procedure on September 24, 2011 (pre-op cath minimal CAD). Coumadin stopped due to falls. 03/31/13 S/P DDD ICD. Has not been able to tolerate Revatio in past.   Myoview 02/2014 -Low risk stress nuclear study with small, mild, fixed anterior and inferior defects consistent with soft tissue attenuation; no ischemia. LV Ejection Fraction: 61%. LV Wall Motion: NL LV Function; NL Wall Motion    Admitted to [facility] 03/26/13 through 04/04/13 had V fib arrest in setting of hypokalemia. Then had multiple episodes or recurrent VT despite correction of electrolyte abnormalities. Seen by Dr. Caryl Comes and received ICD.   08/12/12 RHC  RA = 15 v= 21  RV = 64/3/13  PA = 59/16 (33)  PCW = 14  Fick cardiac output/index = 4.1/2.4  PVR = 7.8 [place]  FA sat = 98%  PA sat = 60%, 63%   Was seen by Dr. Aundra Dubin 2 days ago with volume overload, torsemide was increased and she has had good response. Weight at that time was 157 pounds. She says she has lost 10lbs. Dr. Aundra Dubin was concerned for worsening R heart failure in the setting of severe TR and plan was for close follow up with repeat Echo.  Yesterday she was admitted with CP N/V. Transferred from [place] to [facility]. CEs negative. Diuretics held.   Feels better today. Denies Dyspnea.    Pro BNP 2862  Creatinine 1.6>1.6    Objective:   Weight Range:  Vital Signs:   Temp:  [97.5 F (36.4 C)-99.1 F (37.3 C)] 97.5 F (36.4 C) (05/22 0648) Pulse Rate:  [69-87] 69 (05/22 0648) Resp:  [18] 18 (05/22 0648) BP: (105-120)/(43-67) 118/54 mmHg (05/22 0648) SpO2:  [99 %-100 %] 100 % (05/22 0648) Weight:  [143 lb 1.6 oz  (64.91 kg)-144 lb 3.2 oz (65.409 kg)] 144 lb 3.2 oz (65.409 kg) (05/22 0648) Last BM Date: 03/31/14  Weight change: Filed Weights   03/31/14 2100 04/01/14 0648  Weight: 143 lb 1.6 oz (64.91 kg) 144 lb 3.2 oz (65.409 kg)    Intake/Output:   Intake/Output Summary (Last 24 hours) at 04/01/14 0940 Last data filed at 04/01/14 0911  Gross per 24 hour  Intake    240 ml  Output    200 ml  Net     40 ml     Physical Exam: General:  Chronically ill appearing.  No resp difficulty HEENT: normal Neck: supple. JVP 10 + CV waves  . Carotids 2+ bilat; no bruits. No lymphadenopathy or thryomegaly appreciated. Cor: PMI nondisplaced. Regular rate & rhythm. No rubs, gallops. 3/6 TR. 2/6 AS Lungs: clear Abdomen: soft, nontender, +distended. No hepatosplenomegaly. No bruits or masses. Good bowel sounds. Extremities: no cyanosis, clubbing, rash, edema Neuro: alert & orientedx3, cranial nerves grossly intact. moves all 4 extremities w/o difficulty. Affect pleasant  Telemetry:   Labs: Basic Metabolic Panel:  Recent Labs Lab 03/29/14 1607 03/31/14 2346 04/01/14 0750  NA 139  --  140  K 5.0  --  4.3  CL 103  --  101  CO2 22  --  23  GLUCOSE 122*  --  191*  BUN 20  --  30*  CREATININE 1.73* 1.69* 1.67*  CALCIUM 7.7*  --  7.9*    Liver Function Tests: No results found for this basename: AST, ALT, ALKPHOS, BILITOT, PROT, ALBUMIN,  in the last 168 hours No results found for this basename: LIPASE, AMYLASE,  in the last 168 hours No results found for this basename: AMMONIA,  in the last 168 hours  CBC:  Recent Labs Lab 03/31/14 2346  WBC 9.3  HGB 10.5*  HCT 32.2*  MCV 98.8  PLT 166    Cardiac Enzymes: No results found for this basename: CKTOTAL, CKMB, CKMBINDEX, TROPONINI,  in the last 168 hours  BNP: BNP (last 3 results)  Recent Labs  09/28/13 1220 03/29/14 1607  PROBNP 1416.0* 2862.0*     Other results:    Imaging:  No results found.   Medications:      Scheduled Medications: . aspirin EC  81 mg Oral Daily  . ferrous sulfate  325 mg Oral Q breakfast  . folic acid  1 mg Oral BID  . heparin  5,000 Units Subcutaneous 3 times per day  . letrozole  2.5 mg Oral Daily  . levothyroxine  137 mcg Oral QAC breakfast  . lisinopril  2.5 mg Oral Daily  . metoprolol tartrate  25 mg Oral BID  . mirtazapine  15 mg Oral QHS  . pantoprazole  40 mg Oral QPM  . sodium chloride  3 mL Intravenous Q12H  . spironolactone  12.5 mg Oral Daily  . torsemide  40 mg Oral BID     Infusions:     PRN Medications:  sodium chloride, acetaminophen, LORazepam, ondansetron (ZOFRAN) IV, oxyCODONE, oxyCODONE-acetaminophen, sodium chloride   Assessment:  1. A/C Diastolic Heart Failure 2. Chest Pain 3. Nausea 4. A fib-  Paroxysmal, s/p Maze 5. TR  6. MVR- Had bioprosthetic MVR.  7/ H/O V fib arrest in the setting of hypokalemia. Has [employer] ICD.    Plan/Discussion:   Admitted with chest pain. CEs negative. Volume status stable. For now will hold diuretics until N/V resolved.Weight down 13 pounds from office visit earlier this week. Renal function ok. Check ECHO. Interrogate ICD   N/V ? Viral component. CXR was ok.  Abdomen nontender. Afebrile.   Length of Stay: 1 Amy D Clegg NP-C  04/01/2014, 9:40 AM Advanced Heart Failure Team Pager [phone] (M-F; 7a - 4p)  Please contact [facility] for night-coverage after hours (4p -7a ) and weekends on [email or web address]  Patient seen and examined with Darrick Grinder, NP. We discussed all aspects of the encounter. I agree with the assessment and plan as stated above.   Optivol interrogated with help of MDT rep. Volume status elevated.   She is improved but volume status remains elevated on exam and by Optivol. Will continue IV diuresis at least one more day. Suspect CP may be due to capsular distension of liver. No signifcat CAD on pre-op cath and recent Myoview was fine. CEs negative.   Hopefully she can be  discharged tomorrow with close f/u in HF clinic.   Shaune Pascal Bensimhon,MD 1:03 PM

## 2014-04-02 LAB — BASIC METABOLIC PANEL
BUN: 43 mg/dL — AB (ref 6–23)
CO2: 24 mEq/L (ref 19–32)
CREATININE: 2.07 mg/dL — AB (ref 0.50–1.10)
Calcium: 8.1 mg/dL — ABNORMAL LOW (ref 8.4–10.5)
Chloride: 102 mEq/L (ref 96–112)
GFR, EST AFRICAN AMERICAN: 27 mL/min — AB (ref >90)
GFR, EST NON AFRICAN AMERICAN: 23 mL/min — AB (ref >90)
GLUCOSE: 98 mg/dL (ref 70–99)
Potassium: 3.9 mEq/L (ref 3.7–5.3)
Sodium: 143 mEq/L (ref 137–147)

## 2014-04-02 NOTE — Progress Notes (Addendum)
Physician Discharge Summary  Patient ID: [name] MRN: [id number] DOB/AGE: 71-Sep-1942 71 y.o.  Admit date: 03/31/2014 Discharge date: 04/02/2014  Primary Discharge Diagnosis 1. Acute on Chronic Diastolic CHF 2. Chest Pain  Primary Cardiologist: Aundra Dubin  Significant Diagnostic Studies: 1.Echocardiogram 04/01/2014 Left ventricle: The cavity size was normal. Systolic function was mildly reduced. The estimated ejection fraction was in the range of 45% to 50%. Diffuse hypokinesis. - Mitral valve: A bioprosthesis was present. The prosthesis had a normal range of motion. The sewing ring appeared normal. - Left atrium: The atrium was mildly dilated. - Right ventricle: The cavity size was moderately dilated. Wall thickness was normal. - Right atrium: The atrium was mildly dilated. - Tricuspid valve: There was moderate regurgitation. - Pulmonary arteries: Systolic pressure was mildly to moderately increased. PA peak pressure: 48 mm Hg (S).   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 Status post bioprosthetic mitral valve replacement, tricuspid valve repair, and Maze procedure on September 24, 2011 (pre-op cath minimal CAD). Coumadin stopped due to falls. 03/31/13 S/P DDD ICD. Has not been able to tolerate Revatio in past.    She was seen by Dr. Aundra Dubin after experiencing volume overload. The patient's torsemide was increased and she had good response. At that time was 157 pounds. The patient had lost 10 pounds, but due to concern for worsening right heart failure in the setting of severe TR the patient was admitted for further evaluation with symptoms of chest pain and nausea and vomiting. She was [facility] from [facility] to [facility] on 03/31/2014.  Echocardiogram was  completed on 04/01/2014 results described above, EF was 45-50% with diffuse hypokinesis. Diuretics were held initially until  nausea and vomiting resolved. Optival was interrogated and found to be elevated initially. She was restarted on IV diuresis 80 mg twice a day. She was continued on spironolactone 12.5 mg daily. The patient's weight dropped 9 pounds from 143lbs to 134 pounds on discharge. She was transitioned back to by oral diuretic she was taking prior to admission, torsemide 20 mg BID.  She was seen and examined by Dr. Wynonia Lawman prior to discharge and found to be stable. The patient has a followup appointment with Dr. Aundra Dubin in 5 days, which she is encouraged to keep. Chest pain has been resolved. No further nausea and vomiting.    Discharge Exam: Blood pressure 117/68, pulse 71, temperature 97.6 F (36.4 C), temperature source Oral, resp. rate 18, height 5\' 2"  (1.575 m), weight 134 lb 11.2 oz (61.1 kg), SpO2 100.00%.     Lab Results  Component Value Date   WBC 9.3 03/31/2014   HGB 10.5* 03/31/2014   HCT 32.2* 03/31/2014   MCV 98.8 03/31/2014   PLT 166 03/31/2014     Recent Labs Lab 04/02/14 0542  NA 143  K 3.9  CL 102  CO2 24  BUN 43*  CREATININE 2.07*  CALCIUM 8.1*  GLUCOSE 98   Lab Results  Component Value Date   CKTOTAL 112 04/01/2014   CKMB 1.3 04/01/2014   TROPONINI <0.30 04/01/2014        Radiology: 03/04/2014 Impression  Exercise Capacity: Lexiscan with no exercise.  BP Response: Normal blood pressure response.  Clinical Symptoms: There is dyspnea.  ECG Impression: No significant ST segment change suggestive  of ischemia.  Comparison with Prior Nuclear Study: No images to compare  Overall Impression: Low risk stress nuclear study with small, mild, fixed anterior and inferior defects consistent with soft tissue attenuation; no ischemia.  LV Ejection Fraction: 61%. LV Wall Motion: NL LV Function; NL Wall Motion   FOLLOW UP PLANS AND APPOINTMENTS     Discharge  Instructions   Diet - low sodium heart healthy    Complete by:  As directed      Increase activity slowly    Complete by:  As directed             Medication List    STOP taking these medications       ondansetron 4 MG tablet  Commonly known as:  ZOFRAN      TAKE these medications       aspirin EC 81 MG tablet  Take 81 mg by mouth every evening.     cholecalciferol 1000 UNITS tablet  Commonly known as:  VITAMIN D  Take 1,000 Units by mouth daily.     cyanocobalamin 1000 MCG/ML injection  Commonly known as:  (VITAMIN B-12)  Inject 1,000 mcg into the muscle every 30 (thirty) days.     ferrous sulfate 325 (65 FE) MG tablet  Take 325 mg by mouth daily with breakfast.     folic acid 1 MG tablet  Commonly known as:  FOLVITE  Take 2 mg by mouth daily.     letrozole 2.5 MG tablet  Commonly known as:  FEMARA  Take 2.5 mg by mouth daily.     levothyroxine 125 MCG tablet  Commonly known as:  SYNTHROID, LEVOTHROID  Take 125 mcg by mouth daily before breakfast.     lisinopril 5 MG tablet  Commonly known as:  PRINIVIL,ZESTRIL  Take 2.5 mg by mouth daily.     loratadine 10 MG tablet  Commonly known as:  CLARITIN  Take 10 mg by mouth daily as needed for allergies.     LORazepam 2 MG tablet  Commonly known as:  ATIVAN  Take 2 mg by mouth at bedtime.     metoprolol tartrate 25 MG tablet  Commonly known as:  LOPRESSOR  Take 25 mg by mouth 2 (two) times daily.     mirtazapine 15 MG tablet  Commonly known as:  REMERON  Take 15 mg by mouth at bedtime.     nitroGLYCERIN 0.4 MG SL tablet  Commonly known as:  NITROSTAT  Place 0.4 mg under the tongue every 5 (five) minutes as needed for chest pain.     oxyCODONE-acetaminophen 10-325 MG per tablet  Commonly known as:  PERCOCET  Take 1 tablet by mouth every 6 (six) hours.     pantoprazole 40 MG tablet  Commonly known as:  PROTONIX  Take 40 mg by mouth every evening.     potassium chloride SA 20 MEQ tablet  Commonly  known as:  K-DUR,KLOR-CON  Take 20-40 mEq by mouth 2 (two) times daily. Takes 66meq in the morning and 21meq at night     simvastatin 10 MG tablet  Commonly known as:  ZOCOR  Take 10 mg by mouth at bedtime.     spironolactone 25 MG tablet  Commonly known as:  ALDACTONE  Take 12.5 mg by mouth daily.     temazepam 30 MG capsule  Commonly known as:  RESTORIL  Take 30 mg by mouth at bedtime.     torsemide 20 MG tablet  Commonly known as:  DEMADEX  Take 40 mg by mouth 2 (two) times daily.       Follow-up Information   Follow up with Loralie Champagne, MD. (Keep previously scheduled appt.)    Specialty:  Cardiology   Contact information:   [address] [place] [address] [phone]         Time spent with patient to include physician time:40 minutes. Signed: Lendon Colonel 04/02/2014, 10:15 AM Co-Sign MD   See prior progress note.  Stable for discharge.  Agree with above  W. Doristine Church MD [facility]

## 2014-04-02 NOTE — Progress Notes (Signed)
Subjective:  No recurrence of chest pain today and only complains of a headache at the present time.  Objective:  Vital Signs in the last 24 hours: BP 117/68  Pulse 71  Temp(Src) 97.6 F (36.4 C) (Oral)  Resp 18  Ht 5\' 2"  (1.575 m)  Wt 61.1 kg (134 lb 11.2 oz)  BMI 24.63 kg/m2  SpO2 100%  Physical Exam: Pleasant white female currently in no acute distress but complaining of headache Lungs:  Clear Cardiac:  Regular rhythm, normal S1 and S2, no S3, holosystolic blowing systolic murmur heard across the precordium Abdomen:  Soft, nontender, no masses Extremities:  Trace dema present  Intake/Output from previous day: 05/22 0701 - 05/23 0700 In: 960 [P.O.:960] Out: 2075 [Urine:2075]  Weight Filed Weights   03/31/14 2100 04/01/14 0648 04/02/14 0546  Weight: 64.91 kg (143 lb 1.6 oz) 65.409 kg (144 lb 3.2 oz) 61.1 kg (134 lb 11.2 oz)    Lab Results: Basic Metabolic Panel:  Recent Labs  04/01/14 0750 04/02/14 0542  NA 140 143  K 4.3 3.9  CL 101 102  CO2 23 24  GLUCOSE 191* 98  BUN 30* 43*  CREATININE 1.67* 2.07*   CBC:  Recent Labs  03/31/14 2346  WBC 9.3  HGB 10.5*  HCT 32.2*  MCV 98.8  PLT 166   Cardiac Enzymes:  Recent Labs  04/01/14 1313  CKTOTAL 112  CKMB 1.3  TROPONINI <0.30   Assessment/Plan:  Her chest pain is resolved and I think she can go home today. Her creatinine is up a little bit and I think that she can just resume her normal home dose of torsemide. She will be discharged today and will followup in the heart failure clinic on Wednesday.      Kerry Hough  MD [facility]  04/02/2014, 9:51 AM

## 2014-04-07 ENCOUNTER — Encounter (HOSPITAL_COMMUNITY): Payer: Medicare Other

## 2014-04-20 ENCOUNTER — Encounter: Payer: Self-pay | Admitting: Internal Medicine

## 2014-05-26 ENCOUNTER — Encounter: Payer: Medicare Other | Admitting: Internal Medicine

## 2014-05-31 ENCOUNTER — Encounter: Payer: Medicare Other | Admitting: Internal Medicine

## 2014-06-03 ENCOUNTER — Telehealth: Payer: Self-pay | Admitting: Cardiology

## 2014-06-03 ENCOUNTER — Encounter: Payer: Self-pay | Admitting: Cardiology

## 2014-06-03 NOTE — Telephone Encounter (Signed)
LMOVM for pt to send manual transmission from home. Pt overdue for remote transmission / device check.

## 2014-06-28 ENCOUNTER — Telehealth: Payer: Self-pay | Admitting: Cardiology

## 2014-06-28 NOTE — Telephone Encounter (Signed)
Left message with pt husband for pt to return call

## 2014-09-08 ENCOUNTER — Encounter (HOSPITAL_COMMUNITY): Payer: Medicare Other

## 2014-10-03 ENCOUNTER — Telehealth (HOSPITAL_COMMUNITY): Payer: Self-pay | Admitting: Vascular Surgery

## 2014-10-03 MED ORDER — AMOXICILLIN 500 MG PO TABS: 2000.0000 mg | ORAL_TABLET | ORAL | Status: DC | PRN

## 2014-10-03 NOTE — Telephone Encounter (Signed)
Pt with h/o MVR, spoke w/pt's daughter advised pt does need antibiotics, rx sent in

## 2014-10-03 NOTE — Telephone Encounter (Signed)
Pt crown fell out pt has to got to get it replace tomorrow ... Pt daughter wants to know if she needs an antibiotic before getting this done? PLEASE ADVISE

## 2014-10-04 ENCOUNTER — Encounter (HOSPITAL_COMMUNITY): Payer: Medicare Other

## 2014-10-12 ENCOUNTER — Inpatient Hospital Stay (HOSPITAL_COMMUNITY): Admission: RE | Admit: 2014-10-12 | Payer: Medicare Other | Source: Ambulatory Visit

## 2014-10-20 ENCOUNTER — Encounter (HOSPITAL_COMMUNITY): Payer: Self-pay | Admitting: Cardiology

## 2014-12-29 ENCOUNTER — Telehealth: Payer: Self-pay | Admitting: Internal Medicine

## 2014-12-29 NOTE — Telephone Encounter (Signed)
[id number] Status: Notification Sent [20]  Patient: [name],[name] Visit Type: PHYS DEFIB CK [1243]  Notification Date: 12/16/2014 Recall Date: 12/16/2014  Expiration Date 05/11/2015 Letter: RECALL LETTER [10530]  Audit Trail: User: Time: Status:   Buford Dresser [[id number]] 08/03/2013 12:15 PM New [10]   [System] 09/11/2013 11:01 PM Notification Sent [20]   Chanda Busing [[id number]] 03/02/2014 2:13 PM Notification Sent [20]   Merlene Laughter, LPN [[id number]] 2/77/8242 4:42 PM Scheduled/Linked [30]   Weston Anna [[id number]] 05/04/2014 4:16 PM Notification Sent [20]   Weston Anna [[id number]] 05/04/2014 4:16 PM Scheduled/Linked [30]   Merceda Elks Ashworth, CMA [[id number]] 05/30/2014 11:44 AM Notification Sent [20]   Chanda Busing [[id number]] 06/06/2014 4:04 PM Notification Sent [20]   Lynnda Child Slaughter [[id number]] 07/28/2014 10:54 AM Notification Sent [20]   Chanda Busing [[id number]] 09/09/2014 9:56 AM Notification Sent [20]   Weston Anna [[id number]] 12/29/2014 9:19 AM Notification Sent [20]   Scheduling Instructions  mdt icd/phys device check   Appointment Notes  mdt icd/phys device check   Communication History  User: Weston Anna Date/time: 12/29/14 9:19 AM  Comment:   Context: Manually Printed Recall    Phone number:  Phone Type:   Comm. type: Mail Call type:   Contact: Polivka,Gizzelle [name] Relation to patient: Self    User: Delfino Lovett T Date/time: 09/09/14 9:56 AM  Comment:   Context: Manually Printed Recall    Phone number:  Phone Type:   Comm. type: Mail Call type:   Contact: Hankins,Ryin [name] Relation to patient: Self    User: Delfino Lovett T Date/time: 07/28/14 10:54 AM  Comment:   Context: Manually Printed Recall    Phone number:  Phone Type:   Comm. type: Mail Call type:   Contact: Shark,Elynor [name] Relation to patient: Self    User: Delfino Lovett T Date/time: 06/06/14 4:04 PM  Comment:   Context: Manually Printed  Recall    Phone number:  Phone Type:   Comm. type: Mail Call type:   Contact: Olejniczak,Keerthi [name] Relation to patient: Self    User: Delfino Lovett T Date/time: 03/02/14 2:15 PM  Comment:   Context: [facility] Recall Report    Phone number: [phone] Phone Type: Home Phone  Comm. type: Telephone Call type: Outgoing  Contact: Calzadilla,Sonny Relation to patient: Emergency Contact    User: Chanda Busing Date/time: 03/02/14 2:14 PM  Comment:   Context: [facility] Recall Report Outcome: Left Message  Phone number: [phone] Phone Type: Home Phone  Comm. type: Telephone Call type: Outgoing  Contact: Maryjane Hurter Relation to patient: Self    User: Delfino Lovett T Date/time: 03/02/14 2:13 PM  Comment:   Context: Manually Printed Recall    Phone number:  Phone Type:   Comm. type: Mail Call type:   Contact: Schoff,Malva [name] Relation to patient: Self    User: EPIC, USER Date/time: 09/11/13 11:01 PM  Comment:   Context: Recall Letter Batch    Phone number:  Phone Type:   Comm. type: Mail Call type:   Contact: Plagge,Makayla [name] Relation to patient: Self

## 2015-02-09 ENCOUNTER — Telehealth: Payer: Self-pay

## 2015-02-09 NOTE — Telephone Encounter (Signed)
02/09/15 Disc received from [facility] and filed on shelf.Britt Bottom

## 2015-02-14 ENCOUNTER — Encounter: Payer: Self-pay | Admitting: *Deleted

## 2015-03-08 ENCOUNTER — Inpatient Hospital Stay (HOSPITAL_COMMUNITY)
Admission: AD | Admit: 2015-03-08 | Discharge: 2015-03-12 | DRG: 871 | Disposition: A | Discharge status: Home Health | Payer: Medicare Other | Source: Other Acute Inpatient Hospital | Attending: Internal Medicine | Admitting: Internal Medicine

## 2015-03-08 DIAGNOSIS — I5042 Chronic combined systolic (congestive) and diastolic (congestive) heart failure: Secondary | ICD-10-CM

## 2015-03-08 DIAGNOSIS — I5043 Acute on chronic combined systolic (congestive) and diastolic (congestive) heart failure: Secondary | ICD-10-CM | POA: Diagnosis present

## 2015-03-08 DIAGNOSIS — I471 Supraventricular tachycardia: Secondary | ICD-10-CM | POA: Diagnosis present

## 2015-03-08 DIAGNOSIS — A419 Sepsis, unspecified organism: Principal | ICD-10-CM | POA: Diagnosis present

## 2015-03-08 DIAGNOSIS — I482 Chronic atrial fibrillation: Secondary | ICD-10-CM | POA: Diagnosis present

## 2015-03-08 DIAGNOSIS — E876 Hypokalemia: Secondary | ICD-10-CM | POA: Diagnosis present

## 2015-03-08 DIAGNOSIS — I4891 Unspecified atrial fibrillation: Secondary | ICD-10-CM | POA: Diagnosis present

## 2015-03-08 DIAGNOSIS — Z09 Encounter for follow-up examination after completed treatment for conditions other than malignant neoplasm: Secondary | ICD-10-CM

## 2015-03-08 DIAGNOSIS — Z8673 Personal history of transient ischemic attack (TIA), and cerebral infarction without residual deficits: Secondary | ICD-10-CM | POA: Diagnosis not present

## 2015-03-08 DIAGNOSIS — E039 Hypothyroidism, unspecified: Secondary | ICD-10-CM | POA: Diagnosis present

## 2015-03-08 DIAGNOSIS — Z885 Allergy status to narcotic agent status: Secondary | ICD-10-CM

## 2015-03-08 DIAGNOSIS — Z8249 Family history of ischemic heart disease and other diseases of the circulatory system: Secondary | ICD-10-CM | POA: Diagnosis not present

## 2015-03-08 DIAGNOSIS — I10 Essential (primary) hypertension: Secondary | ICD-10-CM | POA: Diagnosis present

## 2015-03-08 DIAGNOSIS — I4581 Long QT syndrome: Secondary | ICD-10-CM | POA: Diagnosis not present

## 2015-03-08 DIAGNOSIS — I472 Ventricular tachycardia, unspecified: Secondary | ICD-10-CM | POA: Insufficient documentation

## 2015-03-08 DIAGNOSIS — F5104 Psychophysiologic insomnia: Secondary | ICD-10-CM | POA: Diagnosis present

## 2015-03-08 DIAGNOSIS — Z9221 Personal history of antineoplastic chemotherapy: Secondary | ICD-10-CM | POA: Diagnosis not present

## 2015-03-08 DIAGNOSIS — Z8679 Personal history of other diseases of the circulatory system: Secondary | ICD-10-CM

## 2015-03-08 DIAGNOSIS — I2510 Atherosclerotic heart disease of native coronary artery without angina pectoris: Secondary | ICD-10-CM | POA: Diagnosis present

## 2015-03-08 DIAGNOSIS — R7989 Other specified abnormal findings of blood chemistry: Secondary | ICD-10-CM | POA: Diagnosis not present

## 2015-03-08 DIAGNOSIS — R0989 Other specified symptoms and signs involving the circulatory and respiratory systems: Secondary | ICD-10-CM | POA: Diagnosis present

## 2015-03-08 DIAGNOSIS — E119 Type 2 diabetes mellitus without complications: Secondary | ICD-10-CM | POA: Diagnosis present

## 2015-03-08 DIAGNOSIS — Z888 Allergy status to other drugs, medicaments and biological substances status: Secondary | ICD-10-CM | POA: Diagnosis not present

## 2015-03-08 DIAGNOSIS — K219 Gastro-esophageal reflux disease without esophagitis: Secondary | ICD-10-CM | POA: Diagnosis present

## 2015-03-08 DIAGNOSIS — L03119 Cellulitis of unspecified part of limb: Secondary | ICD-10-CM | POA: Diagnosis not present

## 2015-03-08 DIAGNOSIS — M542 Cervicalgia: Secondary | ICD-10-CM | POA: Diagnosis present

## 2015-03-08 DIAGNOSIS — I071 Rheumatic tricuspid insufficiency: Secondary | ICD-10-CM | POA: Diagnosis present

## 2015-03-08 DIAGNOSIS — Z8701 Personal history of pneumonia (recurrent): Secondary | ICD-10-CM

## 2015-03-08 DIAGNOSIS — I5022 Chronic systolic (congestive) heart failure: Secondary | ICD-10-CM | POA: Diagnosis not present

## 2015-03-08 DIAGNOSIS — J45909 Unspecified asthma, uncomplicated: Secondary | ICD-10-CM | POA: Diagnosis present

## 2015-03-08 DIAGNOSIS — Z0389 Encounter for observation for other suspected diseases and conditions ruled out: Secondary | ICD-10-CM

## 2015-03-08 DIAGNOSIS — Z923 Personal history of irradiation: Secondary | ICD-10-CM | POA: Diagnosis not present

## 2015-03-08 DIAGNOSIS — Z9229 Personal history of other drug therapy: Secondary | ICD-10-CM | POA: Diagnosis not present

## 2015-03-08 DIAGNOSIS — Z903 Acquired absence of stomach [part of]: Secondary | ICD-10-CM | POA: Diagnosis present

## 2015-03-08 DIAGNOSIS — Z95810 Presence of automatic (implantable) cardiac defibrillator: Secondary | ICD-10-CM

## 2015-03-08 DIAGNOSIS — Z9012 Acquired absence of left breast and nipple: Secondary | ICD-10-CM | POA: Diagnosis present

## 2015-03-08 DIAGNOSIS — Z4502 Encounter for adjustment and management of automatic implantable cardiac defibrillator: Secondary | ICD-10-CM

## 2015-03-08 DIAGNOSIS — L03115 Cellulitis of right lower limb: Secondary | ICD-10-CM | POA: Diagnosis present

## 2015-03-08 DIAGNOSIS — I081 Rheumatic disorders of both mitral and tricuspid valves: Secondary | ICD-10-CM | POA: Diagnosis present

## 2015-03-08 DIAGNOSIS — L03116 Cellulitis of left lower limb: Secondary | ICD-10-CM | POA: Diagnosis present

## 2015-03-08 DIAGNOSIS — Z7982 Long term (current) use of aspirin: Secondary | ICD-10-CM | POA: Diagnosis not present

## 2015-03-08 DIAGNOSIS — Z79899 Other long term (current) drug therapy: Secondary | ICD-10-CM | POA: Diagnosis not present

## 2015-03-08 DIAGNOSIS — R000 Tachycardia, unspecified: Secondary | ICD-10-CM | POA: Insufficient documentation

## 2015-03-08 DIAGNOSIS — I272 Other secondary pulmonary hypertension: Secondary | ICD-10-CM | POA: Diagnosis present

## 2015-03-08 DIAGNOSIS — Z853 Personal history of malignant neoplasm of breast: Secondary | ICD-10-CM

## 2015-03-08 DIAGNOSIS — I5033 Acute on chronic diastolic (congestive) heart failure: Secondary | ICD-10-CM | POA: Diagnosis not present

## 2015-03-08 DIAGNOSIS — I509 Heart failure, unspecified: Secondary | ICD-10-CM | POA: Diagnosis not present

## 2015-03-08 DIAGNOSIS — Z952 Presence of prosthetic heart valve: Secondary | ICD-10-CM | POA: Diagnosis not present

## 2015-03-08 DIAGNOSIS — Z954 Presence of other heart-valve replacement: Secondary | ICD-10-CM

## 2015-03-08 DIAGNOSIS — M1990 Unspecified osteoarthritis, unspecified site: Secondary | ICD-10-CM | POA: Diagnosis present

## 2015-03-08 HISTORY — DX: Presence of automatic (implantable) cardiac defibrillator: Z95.810

## 2015-03-08 HISTORY — DX: Unspecified atrial fibrillation: I48.91

## 2015-03-08 HISTORY — DX: Personal history of other diseases of the circulatory system: Z86.79

## 2015-03-08 LAB — MRSA PCR SCREENING: MRSA by PCR: NEGATIVE

## 2015-03-08 MED ORDER — METOPROLOL TARTRATE 25 MG PO TABS
25.0000 mg | ORAL_TABLET | Freq: Two times a day (BID) | ORAL | Status: DC
  Administered 2015-03-08: 25 mg via ORAL
  Filled 2015-03-08: qty 1

## 2015-03-08 MED ORDER — SIMVASTATIN 10 MG PO TABS
10.0000 mg | ORAL_TABLET | Freq: Every day | ORAL | Status: DC
  Administered 2015-03-08 – 2015-03-11 (×4): 10 mg via ORAL
  Filled 2015-03-08 (×5): qty 1

## 2015-03-08 MED ORDER — SODIUM CHLORIDE 0.9 % IJ SOLN
3.0000 mL | Freq: Two times a day (BID) | INTRAMUSCULAR | Status: DC
  Administered 2015-03-08 – 2015-03-12 (×7): 3 mL via INTRAVENOUS

## 2015-03-08 MED ORDER — FERROUS SULFATE 325 (65 FE) MG PO TABS
325.0000 mg | ORAL_TABLET | Freq: Every day | ORAL | Status: DC
Start: 2015-03-09 — End: 2015-03-12
  Administered 2015-03-09 – 2015-03-12 (×4): 325 mg via ORAL
  Filled 2015-03-08 (×5): qty 1

## 2015-03-08 MED ORDER — OXYCODONE-ACETAMINOPHEN 5-325 MG PO TABS
1.0000 | ORAL_TABLET | ORAL | Status: DC | PRN
  Administered 2015-03-09 – 2015-03-12 (×7): 1 via ORAL
  Filled 2015-03-08 (×8): qty 1

## 2015-03-08 MED ORDER — NITROGLYCERIN 0.4 MG SL SUBL: 0.4000 mg | SUBLINGUAL_TABLET | SUBLINGUAL | Status: DC | PRN

## 2015-03-08 MED ORDER — HEPARIN SODIUM (PORCINE) 5000 UNIT/ML IJ SOLN
5000.0000 [IU] | Freq: Three times a day (TID) | INTRAMUSCULAR | Status: DC
  Administered 2015-03-09 – 2015-03-12 (×11): 5000 [IU] via SUBCUTANEOUS
  Filled 2015-03-08 (×15): qty 1

## 2015-03-08 MED ORDER — ASPIRIN EC 81 MG PO TBEC
81.0000 mg | DELAYED_RELEASE_TABLET | Freq: Every evening | ORAL | Status: DC
  Administered 2015-03-08 – 2015-03-11 (×4): 81 mg via ORAL
  Filled 2015-03-08 (×5): qty 1

## 2015-03-08 MED ORDER — LEVOTHYROXINE SODIUM 125 MCG PO TABS
125.0000 ug | ORAL_TABLET | Freq: Every day | ORAL | Status: DC
  Administered 2015-03-09 – 2015-03-12 (×4): 125 ug via ORAL
  Filled 2015-03-08 (×5): qty 1

## 2015-03-08 MED ORDER — MORPHINE SULFATE 2 MG/ML IJ SOLN
2.0000 mg | INTRAMUSCULAR | Status: DC | PRN
  Administered 2015-03-08 – 2015-03-12 (×2): 2 mg via INTRAVENOUS
  Filled 2015-03-08 (×2): qty 1

## 2015-03-08 MED ORDER — TEMAZEPAM 15 MG PO CAPS
30.0000 mg | ORAL_CAPSULE | Freq: Every day | ORAL | Status: DC
  Administered 2015-03-09 – 2015-03-11 (×4): 30 mg via ORAL
  Filled 2015-03-08: qty 4
  Filled 2015-03-08 (×2): qty 2
  Filled 2015-03-08: qty 4

## 2015-03-08 MED ORDER — ONDANSETRON HCL 4 MG/2ML IJ SOLN: 4.0000 mg | Freq: Four times a day (QID) | INTRAMUSCULAR | Status: DC | PRN

## 2015-03-08 MED ORDER — CLINDAMYCIN PHOSPHATE 600 MG/50ML IV SOLN
600.0000 mg | Freq: Three times a day (TID) | INTRAVENOUS | Status: DC
  Filled 2015-03-08 (×2): qty 50

## 2015-03-08 MED ORDER — ACETAMINOPHEN 325 MG PO TABS: 650.0000 mg | ORAL_TABLET | Freq: Four times a day (QID) | ORAL | Status: DC | PRN

## 2015-03-08 MED ORDER — LORAZEPAM 0.5 MG PO TABS: 0.5000 mg | ORAL_TABLET | Freq: Four times a day (QID) | ORAL | Status: DC | PRN

## 2015-03-08 MED ORDER — LETROZOLE 2.5 MG PO TABS
2.5000 mg | ORAL_TABLET | Freq: Every day | ORAL | Status: DC
  Administered 2015-03-08 – 2015-03-12 (×5): 2.5 mg via ORAL
  Filled 2015-03-08 (×5): qty 1

## 2015-03-08 MED ORDER — CEFEPIME HCL 1 G IJ SOLR
1.0000 g | INTRAMUSCULAR | Status: DC
  Administered 2015-03-09 – 2015-03-10 (×3): 1 g via INTRAVENOUS
  Filled 2015-03-08 (×6): qty 1

## 2015-03-08 MED ORDER — ACETAMINOPHEN 650 MG RE SUPP: 650.0000 mg | Freq: Four times a day (QID) | RECTAL | Status: DC | PRN

## 2015-03-08 MED ORDER — FOLIC ACID 1 MG PO TABS
2.0000 mg | ORAL_TABLET | Freq: Every day | ORAL | Status: DC
  Administered 2015-03-08 – 2015-03-12 (×5): 2 mg via ORAL
  Filled 2015-03-08 (×5): qty 2

## 2015-03-08 MED ORDER — TORSEMIDE 20 MG PO TABS
40.0000 mg | ORAL_TABLET | Freq: Two times a day (BID) | ORAL | Status: DC
  Administered 2015-03-08 – 2015-03-12 (×8): 40 mg via ORAL
  Filled 2015-03-08 (×9): qty 2

## 2015-03-08 MED ORDER — OXYCODONE-ACETAMINOPHEN 10-325 MG PO TABS: 1.0000 | ORAL_TABLET | ORAL | Status: DC | PRN

## 2015-03-08 MED ORDER — LISINOPRIL 5 MG PO TABS
5.0000 mg | ORAL_TABLET | Freq: Every day | ORAL | Status: DC
  Administered 2015-03-08 – 2015-03-12 (×5): 5 mg via ORAL
  Filled 2015-03-08 (×5): qty 1

## 2015-03-08 MED ORDER — SENNOSIDES-DOCUSATE SODIUM 8.6-50 MG PO TABS
1.0000 | ORAL_TABLET | Freq: Two times a day (BID) | ORAL | Status: DC | PRN
  Administered 2015-03-12: 1 via ORAL
  Filled 2015-03-08 (×2): qty 1

## 2015-03-08 MED ORDER — OXYCODONE HCL 5 MG PO TABS
5.0000 mg | ORAL_TABLET | ORAL | Status: DC | PRN
  Administered 2015-03-10 – 2015-03-12 (×5): 5 mg via ORAL
  Filled 2015-03-08 (×6): qty 1

## 2015-03-08 MED ORDER — ONDANSETRON HCL 4 MG PO TABS: 4.0000 mg | ORAL_TABLET | Freq: Four times a day (QID) | ORAL | Status: DC | PRN

## 2015-03-08 MED ORDER — VANCOMYCIN HCL IN DEXTROSE 1-5 GM/200ML-% IV SOLN
1000.0000 mg | INTRAVENOUS | Status: DC
  Administered 2015-03-09 – 2015-03-11 (×3): 1000 mg via INTRAVENOUS
  Filled 2015-03-08 (×5): qty 200

## 2015-03-08 NOTE — Progress Notes (Signed)
ANTIBIOTIC CONSULT NOTE - INITIAL  Pharmacy Consult for Vancomycin and Cefepime Indication: Sepsis  Allergies  Allergen Reactions  . Codeine Nausea And Vomiting  . Butorphanol Tartrate     REACTION: migraines, nervous/agitated  . Lipitor [Atorvastatin Calcium] Diarrhea    nightmares  . Revatio [Sildenafil Citrate] Swelling    Vital Signs: Temp: 99.2 F (37.3 C) (04/27 2030) Temp Source: Oral (04/27 2030) BP: 103/47 mmHg (04/27 2030) Pulse Rate: 72 (04/27 2030)  Labs: No results for input(s): WBC, HGB, PLT, LABCREA, CREATININE in the last 72 hours. CrCl cannot be calculated (Unknown ideal weight.). No results for input(s): VANCOTROUGH, VANCOPEAK, VANCORANDOM, GENTTROUGH, GENTPEAK, GENTRANDOM, TOBRATROUGH, TOBRAPEAK, TOBRARND, AMIKACINPEAK, AMIKACINTROU, AMIKACIN in the last 72 hours.   Microbiology: No results found for this or any previous visit (from the past 720 hour(s)).  Medical History: Past Medical History  Diagnosis Date  . Pulmonary hypertension     s/p RHC 4/10 - PAP 86/29, wedge 21-23  . Gastric ulcer with hemorrhage   . Rheumatic fever     Childhood  . Coronary artery disease     Nonobstructive, LVEF 55%  . Mitral regurgitation     Moderate  . 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 Mitral stenosis with insufficiency, rheumatic   . Hypertension   . Heart murmur   . Stroke     12/11 [place],LEFT SIDE WEAKNESS  . Shortness of breath     EXERTION,PT. STATES SHE HAS PULMONARY HTN  . Pneumonia 1990'S     SEVERAL EPISODES  . Asthma     PT. STATES MILD,USES INHALERS PRN,  . Asthma     PT. ON OXYGEN 2 L/[employer]   . Diabetes mellitus   . Hypothyroidism   . Anemia, pernicious 1979  . Hepatitis     PT. STATES SHE HAD IT WHEN SHE WAS 14 YRS. OLD  . Blood transfusion 2008  . Arthritis   . GERD (gastroesophageal reflux disease)   . Fibromyalgia   . Squamous cell carcinoma     Left arm  . Breast cancer     2008 s/p chemo and radiation (last  tx 4/09) s/p left mastectomy s/p 9 lymph node resection - [name], [place]  . CHF (congestive heart failure) 2010    Assessment: 74 year old female admitted with leukocytosis, tachycardia, hypoxia and fever to begin vancomycin and cefepime for sepsis. She transferred from outside hospital where she only received Levaquin.  Scr = 1.31  Goal of Therapy:  Vancomycin trough level 15-20 mcg/ml  Plan:  Cefepime 1 gram iv Q 24 hours Vancomycin 1 gram iv Q 24 hours Follow up Scr, cultures, fever trend, progress  Thank you. Anette Guarneri, PharmD [phone]  03/08/2015,10:06 PM

## 2015-03-08 NOTE — H&P (Signed)
[facility] History and Physical  Patient: [name]  MRN: [id number]  DOB: September 12, 1941  DOS: the patient was seen and examined on 03/08/2015 PCP: Burton Apley, MD  Referring physician: [facility] Chief Complaint: Chest congestion  HPI: [name] is a 74 y.o. female with Past medical history of chronic combined CHF, hypothyroidism, anemia, GERD, breast cancer status post chemotherapy and radiation, pulmonary hypertension, GI bleed history, coronary artery disease.  The patient presented today with a one-week complaint of chest congestion. She mentions that she has started having complaints of cough with sputum production associated with progressively worsening shortness of breath and fatigue. She denies any chest pain chest heaviness chest tightness. She has poor oral intake with nausea occasionally. She denies any vomiting or abdominal pain. She does not have any bowel movement in the last 2 days she is passing gas though. She denies any burning urination. She has bilateral leg swelling which is chronic but they have been red and she has developed some rash there for which her PCP gave her some cream. She has significant pain in her legs since last 2 days and has been having difficulty walking due to the same. With this complaint she was presented in the ER, in the ER she was initially treated as presumed pneumonia. While in the ER she started having complaints of right-sided chest pain and later on file that her ICD fired. On monitor her heart rate was 150, tracings are not available to assess whether it was regular or irregular. Patient was given magnesium after this finding. After discussing the EKG with patient's cardiologist the patient was brought here for further workup. At the time of my evaluation patient only complaint was neck pain which is chronic for her as well as bilateral leg pain.  She has received following treatments in the ER. IV  levoflox 500 milligrams, IV heparin 4000 Aspirin 325 Magnesium IV 1gm  The patient is coming from home. And at her baseline independent for most of her ADL.  Review of Systems: as mentioned in the history of present illness.  A comprehensive review of the other systems is negative.  Past Medical History  Diagnosis Date  . Pulmonary hypertension     s/p RHC 4/10 - PAP 86/29, wedge 21-23  . Gastric ulcer with hemorrhage   . Rheumatic fever     Childhood  . Coronary artery disease     Nonobstructive, LVEF 55%  . Mitral regurgitation     Moderate  . 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 Mitral stenosis with insufficiency, rheumatic   . Hypertension   . Heart murmur   . Stroke     12/11 [place],LEFT SIDE WEAKNESS  . Shortness of breath     EXERTION,PT. STATES SHE HAS PULMONARY HTN  . Pneumonia 1990'S     SEVERAL EPISODES  . Asthma     PT. STATES MILD,USES INHALERS PRN,  . Asthma     PT. ON OXYGEN 2 L/[place]   . Diabetes mellitus   . Hypothyroidism   . Anemia, pernicious 1979  . Hepatitis     PT. STATES SHE HAD IT WHEN SHE WAS 14 YRS. OLD  . Blood transfusion 2008  . Arthritis   . GERD (gastroesophageal reflux disease)   . Fibromyalgia   . Squamous cell carcinoma     Left arm  . Breast cancer     2008 s/p chemo and radiation (last tx 4/09) s/p left mastectomy  s/p 9 lymph node resection - [name], [place]  . CHF (congestive heart failure) 2010   Past Surgical History  Procedure Laterality Date  . Left mastectomy    . Partial gastrectomy    . Tonsillectomy    . Cardiac catheterization  07/2011  . Mastectomy  2008    LEFT  . Maze  09/24/2011    Procedure: MAZE (complete biatrial lesion set using RF and cryo with clipping of LA appendage);  Surgeon: Rexene Alberts, MD;  Location: [place];  Service: Open Heart Surgery;  Laterality: N/A;  . Mitral valve replacement  09/24/2011    Procedure: MITRAL VALVE (MV) REPLACEMENT (#58m Medtronic Mosaic  bioprosthetic tissue valve);  Surgeon: CRexene Alberts MD;  [place]  Service: Open Heart Surgery;  Laterality: N/A;  . Tricuspid valvuloplasty  09/24/2011    #220mEdwards mc3 ring annuloplasty  . Cardiac defibrillator placement  03/31/13    MDT EvEvalyn CascoR implanted by Dr TaLovena Leor secondary prevention  . Left and right heart catheterization with coronary angiogram N/A 03/30/2013    Procedure: LEFT AND RIGHT HEART CATHETERIZATION WITH CORONARY ANGIOGRAM;  Surgeon: Peter M JoMartiniqueMD;  [facility];  Service: Cardiovascular;  Laterality: N/A;  . Implantable cardioverter defibrillator implant N/A 03/31/2013    Procedure: IMPLANTABLE CARDIOVERTER DEFIBRILLATOR IMPLANT;  Surgeon: GrEvans LanceMD;  [facility];  Service: Cardiovascular;  Laterality: N/A;   Social History:  reports that she has never smoked. She has never used smokeless tobacco. She reports that she does not drink alcohol or use illicit drugs.  Allergies  Allergen Reactions  . Codeine Nausea And Vomiting  . Butorphanol Tartrate     REACTION: migraines, nervous/agitated  . Lipitor [Atorvastatin Calcium] Diarrhea    nightmares  . Revatio [Sildenafil Citrate] Swelling    Family History  Problem Relation Age of Onset  . Heart disease Mother   . Colon cancer Mother     Prior to Admission medications   Medication Sig Start Date End Date Taking? Authorizing Provider  amoxicillin (AMOXIL) 500 MG tablet Take 4 tablets (2,000 mg total) by mouth as needed. Take 1 hour prior to dental work 10/03/14   DaJolaine ArtistMD  aspirin EC 81 MG tablet Take 81 mg by mouth every evening.    Historical Provider, MD  cholecalciferol (VITAMIN D) 1000 UNITS tablet Take 1,000 Units by mouth daily.    Historical Provider, MD  cyanocobalamin (,VITAMIN B-12,) 1000 MCG/ML injection Inject 1,000 mcg into the muscle every 30 (thirty) days.    Historical Provider, MD  ferrous sulfate 325 (65 FE) MG tablet Take 325 mg by  mouth daily with breakfast.    Historical Provider, MD  folic acid (FOLVITE) 1 MG tablet Take 2 mg by mouth daily.     Historical Provider, MD  letrozole (FEMARA) 2.5 MG tablet Take 2.5 mg by mouth daily. 10/10/11   Wayne E Gold, PA-C  levothyroxine (SYNTHROID, LEVOTHROID) 125 MCG tablet Take 125 mcg by mouth daily before breakfast.    Historical Provider, MD  lisinopril (PRINIVIL,ZESTRIL) 5 MG tablet Take 2.5 mg by mouth daily.    Historical Provider, MD  loratadine (CLARITIN) 10 MG tablet Take 10 mg by mouth daily as needed for allergies.    Historical Provider, MD  LORazepam (ATIVAN) 2 MG tablet Take 2 mg by mouth at bedtime.     Historical Provider, MD  metoprolol tartrate (LOPRESSOR) 25 MG tablet Take 25 mg by mouth  2 (two) times daily.    Historical Provider, MD  mirtazapine (REMERON) 15 MG tablet Take 15 mg by mouth at bedtime.  06/19/13   Historical Provider, MD  nitroGLYCERIN (NITROSTAT) 0.4 MG SL tablet Place 0.4 mg under the tongue every 5 (five) minutes as needed for chest pain.    Historical Provider, MD  oxyCODONE-acetaminophen (PERCOCET) 10-325 MG per tablet Take 1 tablet by mouth every 6 (six) hours.     Historical Provider, MD  pantoprazole (PROTONIX) 40 MG tablet Take 40 mg by mouth every evening. 10/10/11   Wayne E Gold, PA-C  potassium chloride SA (K-DUR,KLOR-CON) 20 MEQ tablet Take 20-40 mEq by mouth 2 (two) times daily. Takes 76mq in the morning and 216m at night    Historical Provider, MD  simvastatin (ZOCOR) 10 MG tablet Take 10 mg by mouth at bedtime.    Historical Provider, MD  spironolactone (ALDACTONE) 25 MG tablet Take 12.5 mg by mouth daily.    Historical Provider, MD  temazepam (RESTORIL) 30 MG capsule Take 30 mg by mouth at bedtime.     Historical Provider, MD  torsemide (DEMADEX) 20 MG tablet Take 40 mg by mouth 2 (two) times daily.    Historical Provider, MD    Physical Exam: There were no vitals filed for this visit.  General: Alert, Awake and Oriented to  Time, Place and Person. Appear in moderate distress Eyes: PERRL ENT: Oral Mucosa clear moist. Neck: no JVD Cardiovascular: S1 and S2 Present, aortic systolic Murmur, Peripheral Pulses Present Respiratory: Bilateral Air entry equal and Decreased,  Clear to Auscultation, no Crackles, no wheezes Abdomen: Bowel Sound present, Soft and non tender Skin: Bilateral lower extremity redness as well as diffuse macular Rash nonblanching Extremities: Bilateral Pedal edema, bilateral calf tenderness Neurologic: Grossly no focal neuro deficit.  Labs on Admission:   Her labwork in the other facility was as following Wbc 27.8 Hb 12.2 plt 226, Ck mb 0.5 Sodium 137 Potassium 3.7 Chloride 97 Bicarbonate 27 Creatinine 1.31 Glucose 128 Albumin 3.8 Total bilirubin 3 ast 46 Alt 29 cpk 27 inr 1.3 Troponin 0.05 Magnesium 1.4  Radiological Exams on Admission: Her chest x-ray today and the other facility showed no acute abnormality and no evidence of pneumonia.  EKG: Independently reviewed. normal sinus rhythm, nonspecific ST and T waves changes, occasional PVC noted, unifocal.  Assessment/Plan Principal Problem:   Sepsis Active Problems:   Paroxysmal SVT (supraventricular tachycardia)   Atrial fibrillation   S/P mitral valve replacement   Chronic combined systolic and diastolic heart failure   Tricuspid valve regurgitation   Cellulitis   ICD (implantable cardioverter-defibrillator) discharge   1. Sepsis The patient is presenting with significant leukocytosis with tachycardia, hypoxia, and fever with evidence of infection in her legs as cellulitis. She does have chest congestion but does not appear to have any significant pneumonia based on chest x-ray as well as on clinical examination. She may have some bronchitis or sinusitis. She already has blood cultures obtained in the other facility, and has received levofloxacin, I will obtain her blood cultures again. Check ESR CRP CPK elevated  lactic acid levels. She can have ICD infection as well valvular infections. Needs to closely monitor her blood cultures. I would cover her broadly empirically with vancomycin and cefepime.  2. ICD discharge. The patient mentions that she had a feeling of ICD firing. Her magnesium level was low. It has been replaced. I will recheck lab works. Cardiology has been consulted. EKG does not show any evidence  of acute ischemia. Will follow further recommendation from cardiology. Currently continuing her beta blocker as well as lisinopril. Will replace electrolytes as needed.  3. Chronic arthritis with neck pain. Continuing Percocet at home doses.  4. Chronic insomnia. Continuing home medications.  5. Chronic combined systolic and diastolic heart failure. Currently the patient clinically does not appear to be volume overloaded. Will follow cardiology recommendation. Currently continuing home medications.  Advance goals of care discussion: Full code as per my discussion with patient, patient's husband with her power of attorney   Consults: I discussed with Dr. Saunders Revel from cardiology.  DVT Prophylaxis: subcutaneous Heparin Nutrition: Nothing by mouth after midnight  Disposition: Admitted as inpatient, step-down unit.  Author: Berle Mull, MD [facility] Pager: [phone] 03/08/2015  If 7PM-7AM, please contact night-coverage [email or web address] Password [email or web address]

## 2015-03-09 ENCOUNTER — Inpatient Hospital Stay (HOSPITAL_COMMUNITY): Payer: Medicare Other

## 2015-03-09 DIAGNOSIS — I509 Heart failure, unspecified: Secondary | ICD-10-CM | POA: Diagnosis not present

## 2015-03-09 DIAGNOSIS — I472 Ventricular tachycardia: Secondary | ICD-10-CM

## 2015-03-09 LAB — CBC WITH DIFFERENTIAL/PLATELET
Basophils Absolute: 0 10*3/uL (ref 0.0–0.1)
Basophils Absolute: 0 10*3/uL (ref 0.0–0.1)
Basophils Relative: 0 % (ref 0–1)
Basophils Relative: 0 % (ref 0–1)
EOS ABS: 0.2 10*3/uL (ref 0.0–0.7)
EOS PCT: 1 % (ref 0–5)
Eosinophils Absolute: 0.2 10*3/uL (ref 0.0–0.7)
Eosinophils Relative: 1 % (ref 0–5)
HCT: 32.2 % — ABNORMAL LOW (ref 36.0–46.0)
HEMATOCRIT: 35.4 % — AB (ref 36.0–46.0)
Hemoglobin: 10.3 g/dL — ABNORMAL LOW (ref 12.0–15.0)
Hemoglobin: 11.2 g/dL — ABNORMAL LOW (ref 12.0–15.0)
LYMPHS ABS: 2.3 10*3/uL (ref 0.7–4.0)
LYMPHS PCT: 12 % (ref 12–46)
Lymphocytes Relative: 10 % — ABNORMAL LOW (ref 12–46)
Lymphs Abs: 2.4 10*3/uL (ref 0.7–4.0)
MCH: 28.8 pg (ref 26.0–34.0)
MCH: 29.3 pg (ref 26.0–34.0)
MCHC: 32 g/dL (ref 30.0–36.0)
MCHC: 31.6 g/dL (ref 30.0–36.0)
MCV: 91 fL (ref 78.0–100.0)
MCV: 91.5 fL (ref 78.0–100.0)
MONO ABS: 1 10*3/uL (ref 0.1–1.0)
MONOS PCT: 4 % (ref 3–12)
MONOS PCT: 4 % (ref 3–12)
Monocytes Absolute: 0.7 10*3/uL (ref 0.1–1.0)
NEUTROS PCT: 83 % — AB (ref 43–77)
NEUTROS PCT: 85 % — AB (ref 43–77)
Neutro Abs: 16.8 10*3/uL — ABNORMAL HIGH (ref 1.7–7.7)
Neutro Abs: 19.8 10*3/uL — ABNORMAL HIGH (ref 1.7–7.7)
Platelets: 153 10*3/uL (ref 150–400)
Platelets: 166 10*3/uL (ref 150–400)
RBC: 3.52 MIL/uL — AB (ref 3.87–5.11)
RBC: 3.89 MIL/uL (ref 3.87–5.11)
RDW: 16.7 % — ABNORMAL HIGH (ref 11.5–15.5)
RDW: 16.9 % — ABNORMAL HIGH (ref 11.5–15.5)
WBC: 20 10*3/uL — AB (ref 4.0–10.5)
WBC: 23.3 10*3/uL — ABNORMAL HIGH (ref 4.0–10.5)

## 2015-03-09 LAB — COMPREHENSIVE METABOLIC PANEL
ALBUMIN: 3.1 g/dL — AB (ref 3.5–5.2)
ALK PHOS: 71 U/L (ref 39–117)
ALK PHOS: 80 U/L (ref 39–117)
ALT: 26 U/L (ref 0–35)
ALT: 20 U/L (ref 0–35)
ANION GAP: 14 (ref 5–15)
AST: 32 U/L (ref 0–37)
AST: 27 U/L (ref 0–37)
Albumin: 2.7 g/dL — ABNORMAL LOW (ref 3.5–5.2)
Anion gap: 12 (ref 5–15)
BILIRUBIN TOTAL: 2.7 mg/dL — AB (ref 0.3–1.2)
BUN: 31 mg/dL — AB (ref 6–23)
BUN: 30 mg/dL — AB (ref 6–23)
CALCIUM: 8.6 mg/dL (ref 8.4–10.5)
CHLORIDE: 100 mmol/L (ref 96–112)
CO2: 24 mmol/L (ref 19–32)
CO2: 24 mmol/L (ref 19–32)
Calcium: 9.1 mg/dL (ref 8.4–10.5)
Chloride: 99 mmol/L (ref 96–112)
Creatinine, Ser: 1.35 mg/dL — ABNORMAL HIGH (ref 0.50–1.10)
Creatinine, Ser: 1.37 mg/dL — ABNORMAL HIGH (ref 0.50–1.10)
GFR calc Af Amer: 45 mL/min — ABNORMAL LOW (ref >90)
GFR calc Af Amer: 44 mL/min — ABNORMAL LOW (ref >90)
GFR calc non Af Amer: 38 mL/min — ABNORMAL LOW (ref >90)
GFR, EST NON AFRICAN AMERICAN: 38 mL/min — AB (ref >90)
GLUCOSE: 122 mg/dL — AB (ref 70–99)
Glucose, Bld: 194 mg/dL — ABNORMAL HIGH (ref 70–99)
POTASSIUM: 3.1 mmol/L — AB (ref 3.5–5.1)
Potassium: 3.4 mmol/L — ABNORMAL LOW (ref 3.5–5.1)
SODIUM: 135 mmol/L (ref 135–145)
Sodium: 138 mmol/L (ref 135–145)
TOTAL PROTEIN: 5.9 g/dL — AB (ref 6.0–8.3)
Total Bilirubin: 2.6 mg/dL — ABNORMAL HIGH (ref 0.3–1.2)
Total Protein: 6.7 g/dL (ref 6.0–8.3)

## 2015-03-09 LAB — URINALYSIS, ROUTINE W REFLEX MICROSCOPIC
Glucose, UA: NEGATIVE mg/dL
Ketones, ur: NEGATIVE mg/dL
NITRITE: NEGATIVE
PH: 5.5 (ref 5.0–8.0)
Protein, ur: 30 mg/dL — AB
SPECIFIC GRAVITY, URINE: 1.016 (ref 1.005–1.030)
Urobilinogen, UA: 1 mg/dL (ref 0.0–1.0)

## 2015-03-09 LAB — CK TOTAL AND CKMB (NOT AT ARMC)
CK TOTAL: 19 U/L (ref 7–177)
CK, MB: 2 ng/mL (ref 0.3–4.0)
RELATIVE INDEX: INVALID (ref 0.0–2.5)

## 2015-03-09 LAB — URINE MICROSCOPIC-ADD ON

## 2015-03-09 LAB — TROPONIN I
TROPONIN I: 0.1 ng/mL — AB (ref <0.031)
Troponin I: 0.07 ng/mL — ABNORMAL HIGH (ref <0.031)
Troponin I: 0.06 ng/mL — ABNORMAL HIGH (ref <0.031)

## 2015-03-09 LAB — BRAIN NATRIURETIC PEPTIDE: B Natriuretic Peptide: 698.3 pg/mL — ABNORMAL HIGH (ref 0.0–100.0)

## 2015-03-09 LAB — MAGNESIUM
MAGNESIUM: 1.6 mg/dL (ref 1.5–2.5)
Magnesium: 1.6 mg/dL (ref 1.5–2.5)

## 2015-03-09 LAB — PHOSPHORUS
PHOSPHORUS: 2.3 mg/dL (ref 2.3–4.6)
PHOSPHORUS: 2.2 mg/dL — AB (ref 2.3–4.6)

## 2015-03-09 LAB — PROTIME-INR
INR: 1.53 — ABNORMAL HIGH (ref 0.00–1.49)
INR: 1.7 — ABNORMAL HIGH (ref 0.00–1.49)
PROTHROMBIN TIME: 18.5 s — AB (ref 11.6–15.2)
Prothrombin Time: 20.1 seconds — ABNORMAL HIGH (ref 11.6–15.2)

## 2015-03-09 LAB — C-REACTIVE PROTEIN: CRP: 19.9 mg/dL — AB (ref <0.60)

## 2015-03-09 LAB — TSH: TSH: 1.879 u[IU]/mL (ref 0.350–4.500)

## 2015-03-09 LAB — SEDIMENTATION RATE: SED RATE: 38 mm/h — AB (ref 0–22)

## 2015-03-09 MED ORDER — MAGNESIUM SULFATE 2 GM/50ML IV SOLN
2.0000 g | Freq: Once | INTRAVENOUS | Status: AC
  Administered 2015-03-09: 2 g via INTRAVENOUS
  Filled 2015-03-09: qty 50

## 2015-03-09 MED ORDER — POTASSIUM CHLORIDE ER 10 MEQ PO TBCR
40.0000 meq | EXTENDED_RELEASE_TABLET | Freq: Once | ORAL | Status: AC
  Administered 2015-03-09: 40 meq via ORAL
  Filled 2015-03-09: qty 4

## 2015-03-09 MED ORDER — POTASSIUM CHLORIDE CRYS ER 20 MEQ PO TBCR
40.0000 meq | EXTENDED_RELEASE_TABLET | Freq: Once | ORAL | Status: AC
  Administered 2015-03-09: 40 meq via ORAL
  Filled 2015-03-09: qty 2

## 2015-03-09 NOTE — Progress Notes (Signed)
*  Preliminary Results* Bilateral lower extremity venous duplex completed. Visualized veins of bilateral lower extremities are negative for deep vein thrombosis. There is no evidence of Baker's cyst bilaterally.  03/09/2015  Maudry Mayhew, RVT, RDCS, RDMS

## 2015-03-09 NOTE — Progress Notes (Signed)
  Echocardiogram 2D Echocardiogram has been performed.  [name] 03/09/2015, 11:22 AM

## 2015-03-09 NOTE — Consult Note (Signed)
Cardiology Consultation Note  Patient ID: [name], MRN: [id number], DOB/AGE: Aug 16, 1941 74 y.o. Admit date: 03/08/2015   Date of Consult: 03/09/2015 Primary Physician: Burton Apley, MD Primary Cardiologist: Dr. 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  The patient reports that she was diagnosed with a "infection in my chest" one week ago and was given a 5 day course of antibiotics (patient unclear what the antibiotic was but presumably azithromycin) .  She notes that the antibiotic gave her diarrhea.  Despite that, she notes swelling in her legs and generalized fatigue, that became significantly more pronounced yesterday Mar 12, 2023, 03/08/15).  This prompted her to go to the [facility] ED, where she received at least one ICD shock while on the gurney.  She had felt some preceding lightheadedness but denies palpitations.  She also has not had any chest pain .  She has stable orthopnea, sleeping at about 45.  She has actually lost about 4 pounds in the last week despite having worsening leg swelling and exertional dyspnea.  She reports being compliant with her medications.    Past Medical History  Diagnosis Date  . Pulmonary hypertension     s/p RHC 4/10 - PAP 86/29, wedge 21-23  . Gastric ulcer with hemorrhage   . Rheumatic fever     Childhood  . Coronary artery disease    Nonobstructive, LVEF 55%  . Mitral regurgitation     Moderate  . 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 Mitral stenosis with insufficiency, rheumatic   . Hypertension   . Heart murmur   . Stroke     12/11 [place],LEFT SIDE WEAKNESS  . Shortness of breath     EXERTION,PT. STATES SHE HAS PULMONARY HTN  . Pneumonia 1990'S     SEVERAL EPISODES  . Asthma     PT. STATES MILD,USES INHALERS PRN,  . Asthma     PT. ON OXYGEN 2 L/[place]   . Diabetes mellitus   . Hypothyroidism   . Anemia, pernicious 1979  . Hepatitis     PT. STATES SHE HAD IT WHEN SHE WAS 14 YRS. OLD  . Blood transfusion 2008  . Arthritis   . GERD (gastroesophageal reflux disease)   . Fibromyalgia   . Squamous cell carcinoma     Left arm  . Breast cancer     2008 s/p chemo and radiation (last tx 4/09) s/p left mastectomy s/p 9 lymph node resection - [name], [place]  . CHF (congestive heart failure) 2010      Most Recent Cardiac Studies:  Device interrogation (03/08/15): 3 episodes of VF, the first of which was self terminated.  Second episode was successfully terminated with ATP.  The third episode failed ATP 1 and received 30 J 1.  She is also had 15 episodes of nonsustained ventricular tachycardia lasting up to 4 seconds with maximal ventricular rate of 234 bpm.    2-D echo (04/01/14): Normal LV size with mildly reduced function (LVEF 45-50%) , bioprosthetic  mitral valve with normal parameters.  Mild left atrial dilation.  Moderately dilated RV with normal contraction.  Mild right atrial dilation.  Moderate tricuspid regurgitation and RVSP of 48 mmHg.  LHC (03/30/13):  Nonobstructive CAD with 20-30% mid LAD disease and 40% mid LCx stenosis.  LVEF 30-35% .    RHC (03/30/13):  RA 14/19 with a mean of 13 mm Hg RV 56/6 mm Hg PA 61/21 mean of 35 mm Hg PCWP 20/18 mean of 17 mm Hg. LV 107/10 mm Hg AO 109/59 mean of 77 mm Hg  Mean MV gradient of 12 mm Hg with MV area of .85 cm2  No significant AV  gradient.  Oxygen saturations: PA 48% AO 97%  Cardiac Output (Fick) 3.19 L/min  Cardiac Index (Fick) 1.87 L/min/m2   Surgical History:  Past Surgical History  Procedure Laterality Date  . Left mastectomy    . Partial gastrectomy    . Tonsillectomy    . Cardiac catheterization  07/2011  . Mastectomy  2008    LEFT  . Maze  09/24/2011    Procedure: MAZE (complete biatrial lesion set using RF and cryo with clipping of LA appendage);  Surgeon: Rexene Alberts, MD;  Location: [place];  Service: Open Heart Surgery;  Laterality: N/A;  . Mitral valve replacement  09/24/2011    Procedure: MITRAL VALVE (MV) REPLACEMENT (#68mm Medtronic Mosaic bioprosthetic tissue valve);  Surgeon: Rexene Alberts, MD;  Location: [place];  Service: Open Heart Surgery;  Laterality: N/A;  . Tricuspid valvuloplasty  09/24/2011    #64mm Edwards mc3 ring annuloplasty  . Cardiac defibrillator placement  03/31/13    MDT Evalyn Casco DR implanted by Dr Lovena Le for secondary prevention  . Left and right heart catheterization with coronary angiogram N/A 03/30/2013    Procedure: LEFT AND RIGHT HEART CATHETERIZATION WITH CORONARY ANGIOGRAM;  Surgeon: Peter M Martinique, MD;  Location: [facility] CATH LAB;  Service: Cardiovascular;  Laterality: N/A;  . Implantable cardioverter defibrillator implant N/A 03/31/2013    Procedure: IMPLANTABLE CARDIOVERTER DEFIBRILLATOR IMPLANT;  Surgeon: Evans Lance, MD;  Location: [facility] CATH LAB;  Service: Cardiovascular;  Laterality: N/A;     Home Meds: Prior to Admission medications   Medication Sig Start Date Brighid Koch Date Taking? Authorizing Provider  aspirin EC 81 MG tablet Take 81 mg by mouth every evening.   Yes Historical Provider, MD  ferrous sulfate 325 (65 FE) MG tablet Take 325 mg by mouth daily with breakfast.   Yes Historical Provider, MD  folic acid (FOLVITE) 1 MG tablet Take 2 mg by mouth daily.    Yes Historical Provider, MD  letrozole (FEMARA) 2.5 MG tablet Take 2.5 mg by mouth daily. 10/10/11   Yes Wayne E Gold, PA-C  levothyroxine (SYNTHROID, LEVOTHROID) 125 MCG tablet Take 125 mcg by mouth daily before breakfast.   Yes Historical Provider, MD  lisinopril (PRINIVIL,ZESTRIL) 5 MG tablet Take 5 mg by mouth daily.    Yes Historical Provider, MD  LORazepam (ATIVAN) 0.5 MG tablet Take 0.5-1 mg by mouth every 6 (six) hours as needed (muscle spasm).   Yes Historical Provider, MD  metoprolol tartrate (LOPRESSOR) 25 MG tablet Take 25 mg by mouth 2 (two) times daily.   Yes Historical Provider, MD  oxyCODONE-acetaminophen (PERCOCET) 10-325 MG per tablet Take 1 tablet by mouth every 6 (six) hours.    Yes Historical Provider, MD  potassium chloride SA (K-DUR,KLOR-CON) 20 MEQ tablet Take 20-40 mEq by mouth 2 (two) times daily. Takes 4meq in the  morning and 81meq at night   Yes Historical Provider, MD  simvastatin (ZOCOR) 10 MG tablet Take 10 mg by mouth at bedtime.   Yes Historical Provider, MD  temazepam (RESTORIL) 30 MG capsule Take 30 mg by mouth at bedtime.    Yes Historical Provider, MD  torsemide (DEMADEX) 20 MG tablet Take 40 mg by mouth 2 (two) times daily.   Yes Historical Provider, MD  cholecalciferol (VITAMIN D) 1000 UNITS tablet Take 1,000 Units by mouth daily.    Historical Provider, MD  cyanocobalamin (,VITAMIN B-12,) 1000 MCG/ML injection Inject 1,000 mcg into the muscle every 30 (thirty) days.    Historical Provider, MD  loratadine (CLARITIN) 10 MG tablet Take 10 mg by mouth daily as needed for allergies.    Historical Provider, MD  mirtazapine (REMERON) 15 MG tablet Take 15 mg by mouth at bedtime.  06/19/13   Historical Provider, MD  nitroGLYCERIN (NITROSTAT) 0.4 MG SL tablet Place 0.4 mg under the tongue every 5 (five) minutes as needed for chest pain.    Historical Provider, MD    Inpatient Medications:  . aspirin EC  81 mg Oral QPM  . ceFEPime (MAXIPIME) IV  1 g Intravenous Q24H  . ferrous sulfate  325 mg Oral Q breakfast  . folic acid  2 mg Oral Daily  . heparin  5,000 Units  Subcutaneous 3 times per day  . letrozole  2.5 mg Oral Daily  . levothyroxine  125 mcg Oral QAC breakfast  . lisinopril  5 mg Oral Daily  . magnesium sulfate 1 - 4 g bolus IVPB  2 g Intravenous Once  . metoprolol tartrate  25 mg Oral BID  . potassium chloride  40 mEq Oral Once  . simvastatin  10 mg Oral QHS  . sodium chloride  3 mL Intravenous Q12H  . temazepam  30 mg Oral QHS  . torsemide  40 mg Oral BID  . vancomycin  1,000 mg Intravenous Q24H      Allergies:  Allergies  Allergen Reactions  . Codeine Nausea And Vomiting  . Butorphanol Tartrate     REACTION: migraines, nervous/agitated  . Lipitor [Atorvastatin Calcium] Diarrhea    nightmares  . Revatio [Sildenafil Citrate] Swelling    History   Social History  . Marital Status: Married    Spouse Name: N/A  . Number of Children: 2  . Years of Education: N/A   Occupational History  . Unemployed     High school education   Social History Main Topics  . Smoking status: Never Smoker   . Smokeless tobacco: Never Used  . Alcohol Use: No  . Drug Use: No  . Sexual Activity: Not on file   Other Topics Concern  . Not on file   Social History Narrative     Family History  Problem Relation Age of Onset  . Heart disease Mother   . Colon cancer Mother      Review of Systems: Patient has had subjective fever and chills .  Otherwise, a 12 system review of systems was performed and was negative except as noted in the history of present illness.    Labs:  Recent Labs  03/08/15 2333  CKTOTAL 19  CKMB 2.0  TROPONINI 0.10*   Lab Results  Component Value Date   WBC 23.3* 03/08/2015   HGB 11.2* 03/08/2015   HCT 35.4* 03/08/2015   MCV 91.0 03/08/2015   PLT 166 03/08/2015    Recent Labs Lab 03/08/15 2333  NA  138  K 3.4*  CL 100  CO2 24  BUN 30*  CREATININE 1.35*  CALCIUM 9.1  PROT 6.7  BILITOT 2.7*  ALKPHOS 80  ALT 26  AST 32  GLUCOSE 122*   No results found for: CHOL, HDL, LDLCALC, TRIG Lab  Results  Component Value Date   DDIMER 0.94* 09/24/2011    Radiology/Studies:  No results found.  Wt Readings from Last 3 Encounters:  04/02/14 61.1 kg (134 lb 11.2 oz)  03/03/14 69.854 kg (154 lb)  11/16/13 66.679 kg (147 lb)    EKG:  normal sinus rhythm with right bundle branch block and occasional PACs.  Poor R-wave progression.  Inferior Q waves.  Prolonged QT.  Physical Exam: Blood pressure 114/49, pulse 69, temperature 99.6 F (37.6 C), temperature source Oral, resp. rate 21, SpO2 100 %. General:  elderly woman, lying comfortably in bed.  Head: Normocephalic, atraumatic, sclera non-icteric, no xanthomas, nares are without discharge. No conjunctival pallor  Neck: Negative for carotid bruJVP mildly elevated at approximately 10-12 cm. Lungs: faint bibasilar crackles with otherwise normal breath sounds.  Normal work of breathing.  Heart: RRR with S1 S2. 2/6 holosystolic murmur loudest at the left lower sternal border. Abdomen: Soft, non-tender, non-distended with normoactive bowel sounds. No hepatomegaly. No rebound/guarding. No obvious abdominal masses. Msk:  Strength and tone appear normal for age. Extremities: No clubbing or cyanosis. No edema.  Distal pedal pulses are 2+ and equal bilaterally. Neuro: Alert and oriented X 3. No facial asymmetry. No focal deficit. Moves all extremities spontaneously. Psych:  Responds to questions appropriately with a normal affect.    Assessment and Plan:  74 year old woman with complex cardiac history including rheumatic heart disease with pulmonary hypertension, mildly reduced LV function, and prior VT/VF status post ICD , who presents for further evaluation of ICD shocks in the setting of worsening fatigue/generalized weakness.  The patient appears somewhat volume overloaded with lower extremity edema, bibasilar rales, and mildly elevated JVP.  She also has a leukocytosis which is concerning for possible infectious process.  Furthermore, she  has had multiple episodes of tachycardia arrhythmias concerning for VT/VF, 2 of which required therapy from her device.    ICD shocks: These appear to be appropriate treatments by her device.  Given her history and EKG findings (including prolonged QT), I'm concerned that she may have had torsades.  This is further strengthened by her recent antibiotic use, which may of been a QT prolonging agents such as azithromycin or a fluoroquinolone , as well as her magnesium and potassium deficiency.  At this time, we recommend aggressive repletion of her electrolytes.  The patient should be maintained on telemetry.  We will be careful with aggressive diuresis until her electrolytes have been adequately repleted. -  Replete potassium and magnesium to be greater than 4.0 and 2.0, respectively - Maintain telemetry monitoring  - Avoid all CT prolonging medications, including Zofran - Obtained daily 12-lead EKGs for QT assessment.   - Consider discontinuing beta blocker for the time being, as slower heart rates may increase the patient's propensity for torsades.    Decompensated heart failure: patient is volume overloaded.  Device interrogation also demonstrates evidence of fluid retention based on OptiVol findings.  -  reasonable to continue torsemide 40 mg twice a day, though may need to switch to IV Lasix to initiate diuresis.  Would aim for goal of net -1 L in 24 hours. - Hold metoprolol for the time being setting of acute decompensated heart failure  and aforementioned issues with QT prolongation. - Continue lisinopril, as blood pressure allows.  Leukocytosis: Unclear infectious etiology but agree with empiric treatment. - Further evaluation and empiric treatment per hospitalist service.  Troponin elevation: This is nonspecific in the setting of the patient's decompensated heart failure and ICD shocks for VT/VF.  Prior catheterizations have shown nonobstructive CAD, and I do not believe this reflects an acute  coronary syndrome event.   Signed, Layten Aiken A. MD 03/09/2015, 1:36 AM Pager: [phone]

## 2015-03-09 NOTE — Progress Notes (Signed)
Pt finger nails are blue and cap refill is greater than 3 sec. Baltazar Najjar, NP, was notified.    Thorne Wirz. RN

## 2015-03-09 NOTE — Progress Notes (Signed)
[facility] PROGRESS NOTE  [name]:[id number] DOB: 02-17-1941 DOA: 03/08/2015 PCP: Burton Apley, MD  Assessment/Plan: 1. Sepsis from cellulitis of the lower extremities/ questionable pneumonia: Admitted to step down, started on broad spectrum antibiotics. BLOOD cultures ordered and negative so far. Continue with broad spectrum antibiotics.   Hypotension: Improved.    Hypothyroidism: TSH within normal limits.  Resume synthroid.    Hypokalemia: Replete as needed.  Repeat in am.    Hypomagnesemia: Replete as needed.   Acute on chronic systolic and diastolic heart failure: Mild fluid overload on exam.  Elevated BNP.  Started on diuretics.  Monitor intake and output, daily weights.  Appreciate cardiology recommendations.    Elevated troponins: probably from demand ischemia from sepsis and heart failure. She denies any chest pain.   ICD discharge: evaluated by cardiology and recommendations given.   Code Status: full code.  Family Communication: none at bedside.  Disposition Plan: pending PT eval   Consultants:  cardiology  Procedures:  Venous duplex  Echocardiogram.  Antibiotics:  Vancomycin   Cefepime.   HPI/Subjective: She wants to eat, she was made NPO from ED.   Objective: Filed Vitals:   03/09/15 1652  BP: 123/37  Pulse: 74  Temp: 98.3 F (36.8 C)  Resp: 17    Intake/Output Summary (Last 24 hours) at 03/09/15 1727 Last data filed at 03/09/15 1500  Gross per 24 hour  Intake    900 ml  Output   2300 ml  Net  -1400 ml   Filed Weights   03/09/15 0432 03/09/15 1300  Weight: 70.5 kg (155 lb 6.8 oz) 66.2 kg (145 lb 15.1 oz)    Exam:   General:  Alert afebrile comfortable  Cardiovascular: s1s2  Respiratory: clear to auscultation, no wheezing or rhonchi  Abdomen: soft non tender non distended bowel sounds heard  Musculoskeletal: bilateral  Cellulitis of the lower extremities.   Data Reviewed: Basic Metabolic  Panel:  Recent Labs Lab 03/08/15 2333 03/09/15 0323 03/09/15 0335  NA 138 135  --   K 3.4* 3.1*  --   CL 100 99  --   CO2 24 24  --   GLUCOSE 122* 194*  --   BUN 30* 31*  --   CREATININE 1.35* 1.37*  --   CALCIUM 9.1 8.6  --   MG 1.6  --  1.6  PHOS 2.2*  --  2.3   Liver Function Tests:  Recent Labs Lab 03/08/15 2333 03/09/15 0323  AST 32 27  ALT 26 20  ALKPHOS 80 71  BILITOT 2.7* 2.6*  PROT 6.7 5.9*  ALBUMIN 3.1* 2.7*   No results for input(s): LIPASE, AMYLASE in the last 168 hours. No results for input(s): AMMONIA in the last 168 hours. CBC:  Recent Labs Lab 03/08/15 2333 03/09/15 0323  WBC 23.3* 20.0*  NEUTROABS 19.8* 16.8*  HGB 11.2* 10.3*  HCT 35.4* 32.2*  MCV 91.0 91.5  PLT 166 153   Cardiac Enzymes:  Recent Labs Lab 03/08/15 2333 03/09/15 0259 03/09/15 0929  CKTOTAL 19  --   --   CKMB 2.0  --   --   TROPONINI 0.10* 0.07* 0.06*   BNP (last 3 results)  Recent Labs  03/08/15 2333  BNP 698.3*    ProBNP (last 3 results)  Recent Labs  03/29/14 1607  PROBNP 2862.0*    CBG: No results for input(s): GLUCAP in the last 168 hours.  Recent Results (from the past 240 hour(s))  MRSA PCR Screening  Status: None   Collection Time: 03/08/15  8:37 PM  Result Value Ref Range Status   MRSA by PCR NEGATIVE NEGATIVE Final    Comment:        The GeneXpert MRSA Assay (FDA approved for NASAL specimens only), is one component of a comprehensive MRSA colonization surveillance program. It is not intended to diagnose MRSA infection nor to guide or monitor treatment for MRSA infections.   Culture, blood (routine x 2)     Status: None (Preliminary result)   Collection Time: 03/08/15 11:20 PM  Result Value Ref Range Status   Specimen Description BLOOD RIGHT ANTECUBITAL  Final   Special Requests   Final    BOTTLES DRAWN AEROBIC AND ANAEROBIC 5CC AER 1CC ANA   Culture PENDING  Incomplete   Report Status PENDING  Incomplete     Studies: Dg  Chest Port 1 View  03/09/2015   CLINICAL DATA:  Follow-up cough  EXAM: PORTABLE CHEST - 1 VIEW  COMPARISON:  04/01/2013  FINDINGS: Cardiomegaly persists. Low volumes. Left jugular venous catheter removed. Right subclavian AICD stable.  Left basilar airspace disease improved. Right upper lobe airspace disease improved. Bilateral lower lung linear atelectasis persists. No pneumothorax. Pleural parenchymal opacity at the left apex is likely chronic.  IMPRESSION: Improved bilateral airspace disease.   Electronically Signed   By: Marybelle Killings M.D.   On: 03/09/2015 12:16    Scheduled Meds: . aspirin EC  81 mg Oral QPM  . ceFEPime (MAXIPIME) IV  1 g Intravenous Q24H  . ferrous sulfate  325 mg Oral Q breakfast  . folic acid  2 mg Oral Daily  . heparin  5,000 Units Subcutaneous 3 times per day  . letrozole  2.5 mg Oral Daily  . levothyroxine  125 mcg Oral QAC breakfast  . lisinopril  5 mg Oral Daily  . simvastatin  10 mg Oral QHS  . sodium chloride  3 mL Intravenous Q12H  . temazepam  30 mg Oral QHS  . torsemide  40 mg Oral BID  . vancomycin  1,000 mg Intravenous Q24H   Continuous Infusions:   Principal Problem:   Sepsis Active Problems:   Paroxysmal SVT (supraventricular tachycardia)   Atrial fibrillation   S/P mitral valve replacement   Chronic combined systolic and diastolic heart failure   Tricuspid valve regurgitation   Cellulitis   ICD (implantable cardioverter-defibrillator) discharge    Time spent: 25 minutes.     [facility] Pager [phone]. If 7PM-7AM, please contact night-coverage at [email or web address], password [facility] 03/09/2015, 5:27 PM  LOS: 1 day

## 2015-03-10 ENCOUNTER — Encounter (HOSPITAL_COMMUNITY): Payer: Self-pay | Admitting: Nurse Practitioner

## 2015-03-10 DIAGNOSIS — Z8679 Personal history of other diseases of the circulatory system: Secondary | ICD-10-CM

## 2015-03-10 DIAGNOSIS — I4581 Long QT syndrome: Secondary | ICD-10-CM

## 2015-03-10 DIAGNOSIS — I472 Ventricular tachycardia, unspecified: Secondary | ICD-10-CM | POA: Insufficient documentation

## 2015-03-10 DIAGNOSIS — R7989 Other specified abnormal findings of blood chemistry: Secondary | ICD-10-CM

## 2015-03-10 DIAGNOSIS — I5022 Chronic systolic (congestive) heart failure: Secondary | ICD-10-CM

## 2015-03-10 LAB — BASIC METABOLIC PANEL
Anion gap: 11 (ref 5–15)
BUN: 30 mg/dL — ABNORMAL HIGH (ref 6–23)
CO2: 26 mmol/L (ref 19–32)
Calcium: 9.1 mg/dL (ref 8.4–10.5)
Chloride: 103 mmol/L (ref 96–112)
Creatinine, Ser: 1.36 mg/dL — ABNORMAL HIGH (ref 0.50–1.10)
GFR calc non Af Amer: 38 mL/min — ABNORMAL LOW (ref >90)
GFR, EST AFRICAN AMERICAN: 44 mL/min — AB (ref >90)
GLUCOSE: 131 mg/dL — AB (ref 70–99)
POTASSIUM: 4 mmol/L (ref 3.5–5.1)
Sodium: 140 mmol/L (ref 135–145)

## 2015-03-10 LAB — CBC
HCT: 31.7 % — ABNORMAL LOW (ref 36.0–46.0)
Hemoglobin: 10.3 g/dL — ABNORMAL LOW (ref 12.0–15.0)
MCH: 29.3 pg (ref 26.0–34.0)
MCHC: 32.5 g/dL (ref 30.0–36.0)
MCV: 90.3 fL (ref 78.0–100.0)
Platelets: 156 10*3/uL (ref 150–400)
RBC: 3.51 MIL/uL — AB (ref 3.87–5.11)
RDW: 16.9 % — ABNORMAL HIGH (ref 11.5–15.5)
WBC: 12.2 10*3/uL — AB (ref 4.0–10.5)

## 2015-03-10 LAB — MAGNESIUM: MAGNESIUM: 2.4 mg/dL (ref 1.5–2.5)

## 2015-03-10 MED ORDER — SPIRONOLACTONE 12.5 MG HALF TABLET
12.5000 mg | ORAL_TABLET | Freq: Every day | ORAL | Status: DC
  Administered 2015-03-10 – 2015-03-12 (×3): 12.5 mg via ORAL
  Filled 2015-03-10 (×3): qty 1

## 2015-03-10 MED ORDER — WHITE PETROLATUM GEL
Status: AC
  Administered 2015-03-10: 0.2
  Filled 2015-03-10: qty 1

## 2015-03-10 MED ORDER — METOPROLOL TARTRATE 25 MG PO TABS
25.0000 mg | ORAL_TABLET | Freq: Two times a day (BID) | ORAL | Status: DC
  Administered 2015-03-10 – 2015-03-12 (×5): 25 mg via ORAL
  Filled 2015-03-10 (×8): qty 1

## 2015-03-10 NOTE — Progress Notes (Signed)
Received patient from [facility], oriented to unit and staff. Patient is alert and oriented, not in any distress, on O2 at @2LPM ,VSS. Cardiology in and requested patient to be transferred to cardiac unit.

## 2015-03-10 NOTE — Consult Note (Signed)
ELECTROPHYSIOLOGY CONSULT NOTE    Patient ID: [name] MRN: [id number], DOB/AGE: 1941-09-06 74 y.o.  Admit date: 03/08/2015 Date of Consult: 03/10/2015  Primary Physician: Burton Apley, MD Primary Cardiologist: [place] Electrophysiologist: [place]  Reason for Consultation: ICD shock  HPI:  [name] is a 74 y.o. female with a past medical history significant for rheumatic heart disease with subsequent MVR and TV repair, atrial fibrillation s/p MAZE, pulmonary hypertension, and TdP arrest in 03/2013 with subsequent MDT dual chamber ICD implanted.  The patient was seen in the office once post implant but has not responded to phone calls or certified letters to follow up since 06/2013.  She was transferred to [facility] from [place] yesterday after ICD shocks in the setting of generalized fatigue and weakness.  Device interrogation demonstrated 3 episodes in VF zone.  One of which terminated with ATP, 1 required HV shock, and 1 self terminated. Episodes are all polymorphic and are preceded by frequent ventricular ectopy.  Otherwise device interrogation is notable for activity level of <1 hour per day, Optivol elevation, and flat histograms.   She had recently been treated for URI with Zithromax. She reports generalized weakness and fatigue.  WBC is elevated, Tmax over last 24 hours is 99.  She denies chest pain, palpitations, syncope.  She did have dizziness just before her ICD shock.   EP has been asked to evaluate for treatment options.   Past Medical History  Diagnosis Date  . Pulmonary hypertension     s/p RHC 4/10 - PAP 86/29, wedge 21-23  . Gastric ulcer with hemorrhage   . Rheumatic fever     Childhood  . Coronary artery disease     Nonobstructive, LVEF 55%  . Mitral regurgitation     a. s/p bioprosthetic MVR  . Tricuspid regurgitation     a. s/p TV repair  . Hypertension   . Stroke     a. 10/2010 with residual L sided weakness  . Asthma     PT. ON OXYGEN 2 L/[place]     . Diabetes mellitus   . Hypothyroidism   . Anemia, pernicious 1979  . Hepatitis     PT. STATES SHE HAD IT WHEN SHE WAS 14 YRS. OLD  . Arthritis   . GERD (gastroesophageal reflux disease)   . Fibromyalgia   . Squamous cell carcinoma     Left arm  . Breast cancer     2008 s/p chemo and radiation (last tx 4/09) s/p left mastectomy s/p 9 lymph node resection - [name], [place]  . Atrial fibrillation     a. s/p MAZE     Surgical History:  Past Surgical History  Procedure Laterality Date  . Partial gastrectomy    . Tonsillectomy    . Mastectomy  2008    LEFT  . Maze  09/24/2011    Procedure: MAZE (complete biatrial lesion set using RF and cryo with clipping of LA appendage);  Surgeon: Rexene Alberts, MD;  Location: [place];  Service: Open Heart Surgery;  Laterality: N/A;  . Mitral valve replacement  09/24/2011    Procedure: MITRAL VALVE (MV) REPLACEMENT (#28mm Medtronic Mosaic bioprosthetic tissue valve);  Surgeon: Rexene Alberts, MD;  Location: [place];  Service: Open Heart Surgery;  Laterality: N/A;  . Tricuspid valvuloplasty  09/24/2011    #19mm Edwards mc3 ring annuloplasty  . Left and right heart catheterization with coronary angiogram N/A 03/30/2013    Procedure: LEFT AND RIGHT HEART CATHETERIZATION WITH CORONARY ANGIOGRAM;  Surgeon: Peter M Martinique, MD;  Location: [facility] CATH LAB;  Service: Cardiovascular;  Laterality: N/A;  . Implantable cardioverter defibrillator implant N/A 03/31/2013    MDT Evalyn Casco DR implanted by Dr Lovena Le for secondary prevention     Prescriptions prior to admission  Medication Sig Dispense Refill Last Dose  . albuterol (PROVENTIL HFA;VENTOLIN HFA) 108 (90 BASE) MCG/ACT inhaler Inhale 1 puff into the lungs every 6 (six) hours as needed for wheezing or shortness of breath.   03/07/2015  . aspirin EC 81 MG tablet Take 81 mg by mouth every evening.   03/07/2015  . cholecalciferol (VITAMIN D) 1000 UNITS tablet Take 1,000 Units by mouth daily.   03/07/2015  .  cyanocobalamin (,VITAMIN B-12,) 1000 MCG/ML injection Inject 1,000 mcg into the muscle every 30 (thirty) days.   march  . ferrous sulfate 325 (65 FE) MG tablet Take 325 mg by mouth daily with breakfast.   4/00/8676  . folic acid (FOLVITE) 1 MG tablet Take 2 mg by mouth daily.    03/07/2015  . levothyroxine (SYNTHROID, LEVOTHROID) 125 MCG tablet Take 125 mcg by mouth daily before breakfast.   03/07/2015  . lisinopril (PRINIVIL,ZESTRIL) 5 MG tablet Take 5 mg by mouth daily.    03/07/2015  . LORazepam (ATIVAN) 0.5 MG tablet Take 0.5-1 mg by mouth at bedtime as needed for sleep (muscle spasm).    03/07/2015  . metoprolol tartrate (LOPRESSOR) 25 MG tablet Take 25 mg by mouth 2 (two) times daily.   03/07/2015 at 2100  . oxyCODONE-acetaminophen (PERCOCET) 10-325 MG per tablet Take 1 tablet by mouth every 6 (six) hours.    03/07/2015  . potassium chloride SA (K-DUR,KLOR-CON) 20 MEQ tablet Take 20-40 mEq by mouth 2 (two) times daily. Takes 36meq in the morning and 64meq at night   03/07/2015  . simvastatin (ZOCOR) 10 MG tablet Take 10 mg by mouth at bedtime.   03/07/2015  . temazepam (RESTORIL) 30 MG capsule Take 30 mg by mouth at bedtime.    03/07/2015  . thyroid (ARMOUR) 120 MG tablet Take 120 mg by mouth daily before breakfast.   03/07/2015  . torsemide (DEMADEX) 20 MG tablet Take 20 mg by mouth 2 (two) times daily.    03/07/2015  . mirtazapine (REMERON) 15 MG tablet Take 15 mg by mouth at bedtime.    03/07/2015  . nitroGLYCERIN (NITROSTAT) 0.4 MG SL tablet Place 0.4 mg under the tongue every 5 (five) minutes as needed for chest pain.   rescue  . [DISCONTINUED] amoxicillin (AMOXIL) 500 MG tablet Take 4 tablets (2,000 mg total) by mouth as needed. Take 1 hour prior to dental work 4 tablet 4   . [DISCONTINUED] LORazepam (ATIVAN) 2 MG tablet Take 2 mg by mouth at bedtime.    03/31/2014 at Unknown time  . [DISCONTINUED] pantoprazole (PROTONIX) 40 MG tablet Take 40 mg by mouth every evening.   03/31/2014 at Unknown time  .  [DISCONTINUED] spironolactone (ALDACTONE) 25 MG tablet Take 12.5 mg by mouth daily.   03/31/2014 at Unknown time    Inpatient Medications:  . aspirin EC  81 mg Oral QPM  . ceFEPime (MAXIPIME) IV  1 g Intravenous Q24H  . ferrous sulfate  325 mg Oral Q breakfast  . folic acid  2 mg Oral Daily  . heparin  5,000 Units Subcutaneous 3 times per day  . letrozole  2.5 mg Oral Daily  . levothyroxine  125 mcg Oral QAC breakfast  . lisinopril  5 mg Oral  Daily  . simvastatin  10 mg Oral QHS  . sodium chloride  3 mL Intravenous Q12H  . temazepam  30 mg Oral QHS  . torsemide  40 mg Oral BID  . vancomycin  1,000 mg Intravenous Q24H    Allergies:  Allergies  Allergen Reactions  . Codeine Nausea And Vomiting  . Butorphanol Tartrate     REACTION: migraines, nervous/agitated  . Lipitor [Atorvastatin Calcium] Diarrhea    nightmares  . Revatio [Sildenafil Citrate] Swelling    History   Social History  . Marital Status: Married    Spouse Name: N/A  . Number of Children: 2  . Years of Education: N/A   Occupational History  . Unemployed     High school education   Social History Main Topics  . Smoking status: Never Smoker   . Smokeless tobacco: Never Used  . Alcohol Use: No  . Drug Use: No  . Sexual Activity: Not on file   Other Topics Concern  . Not on file   Social History Narrative     Family History  Problem Relation Age of Onset  . Heart disease Mother   . Colon cancer Mother      Review of Systems: All other systems reviewed and are otherwise negative except as noted above.  Physical Exam: Filed Vitals:   03/10/15 0400 03/10/15 0600 03/10/15 0756 03/10/15 0920  BP: 113/55 104/56 96/38 123/57  Pulse: 74 71 70   Temp: 98.1 F (36.7 C)  98.2 F (36.8 C)   TempSrc: Oral  Oral   Resp: 18 17 11    Height:      Weight: 152 lb 1.9 oz (69 kg)     SpO2: 100% 100% 97%     GEN- The patient is elderly and chronically ill appearing, alert and oriented x 3 today.   HEENT:  normocephalic, atraumatic; sclera clear, conjunctiva pink; hearing intact; oropharynx clear; neck supple  Lymph- no cervical lymphadenopathy Lungs- Clear to ausculation bilaterally, normal work of breathing.  No wheezes, rales, rhonchi Heart- Regular rate and rhythm, 2/6 systolic murmur GI- soft, non-tender, non-distended, bowel sounds present  Extremities- no clubbing, cyanosis, or edema; DP/PT/radial pulses 2+ bilaterally MS- no significant deformity or atrophy Skin- warm and dry, no rash or lesion Psych- euthymic mood, full affect Neuro- strength and sensation are intact  Labs:   Lab Results  Component Value Date   WBC 12.2* 03/10/2015   HGB 10.3* 03/10/2015   HCT 31.7* 03/10/2015   MCV 90.3 03/10/2015   PLT 156 03/10/2015     Recent Labs Lab 03/09/15 0323 03/10/15 0218  NA 135 140  K 3.1* 4.0  CL 99 103  CO2 24 26  BUN 31* 30*  CREATININE 1.37* 1.36*  CALCIUM 8.6 9.1  PROT 5.9*  --   BILITOT 2.6*  --   ALKPHOS 71  --   ALT 20  --   AST 27  --   GLUCOSE 194* 131*      Radiology/Studies: Dg Chest Port 1 View 03/09/2015   CLINICAL DATA:  Follow-up cough  EXAM: PORTABLE CHEST - 1 VIEW  COMPARISON:  04/01/2013  FINDINGS: Cardiomegaly persists. Low volumes. Left jugular venous catheter removed. Right subclavian AICD stable.  Left basilar airspace disease improved. Right upper lobe airspace disease improved. Bilateral lower lung linear atelectasis persists. No pneumothorax. Pleural parenchymal opacity at the left apex is likely chronic.  IMPRESSION: Improved bilateral airspace disease.   Electronically Signed   By: Marybelle Killings  M.D.   On: 03/09/2015 12:16    RNH:AFBXU rhythm, RBBB, PAC's, poor R wave progression, QTc 573  TELEMETRY: AP/VS, occasional PVC's  DEVICE HISTORY: MDT dual chamber ICD implanted 03/2013 for secondary prevention  Assessment/Plan: 1.  VT/VF in the setting of prolonged QT interval The patient has had appropriate ICD therapy for PMVT.  She has  known TdP with prolonged QT interval.  Recent antibiotic use and hypokalemia likely contributed to ventricular arrhythmias. Avoid QT prolonging medications Keep K >3.9, Mg >1.8 The patient has a dual chamber device and bradycardia is not an issue, will resume home dose of Metoprolol No driving x6 months (pt aware) The patient has been lost to follow up since 06/2014 despite multiple attempts including certified letters to have her re-establish.  She is aware of the need for regular device follow up and would like to be seen in our [place] office. Will schedule follow up with Dr Rayann Heman  2.  Acute on chronic systolic heart failure Management per AHF team  3.  Leukocytosis Blood cultures negative to date  4.  Elevated troponin Trend flat and in setting of heart failure and ICD shocks is likely demand ischemia No ischemic work up planned at this time   Signed, Chanetta Marshall, NP 03/10/2015   With recurrent TdP with zithromax And hypokalemia   The latter is now restored,  Will need to check Mg Needs to check all drugs with [email or web address] website Will follow ECG until normalized Will transfer to cardiac telemetry  Reviewed with patient   She will need this REVIEWED AGAIN and AGAIN and AGAIN   10:00 AM

## 2015-03-10 NOTE — Progress Notes (Signed)
Report given to [facility]. Admittiing nurse called.

## 2015-03-10 NOTE — Progress Notes (Signed)
[facility] PROGRESS NOTE  [name] XTK:[id number] DOB: 04/26/1941 DOA: 03/08/2015 PCP: Burton Apley, MD  Assessment/Plan: 1. Sepsis from cellulitis of the lower extremities/ questionable pneumonia: Admitted to step down, started on broad spectrum antibiotics. BLOOD cultures ordered and negative so far. Continue with broad spectrum antibiotics.   Hypotension: Improved.    Hypothyroidism: TSH within normal limits.  Resume synthroid.    Hypokalemia: Replete as needed.  Repeat in am  Is normal.    Hypomagnesemia: Replete as needed and repeat level is normal.   Acute on chronic systolic and diastolic heart failure: euvolemic.  Elevated BNP.  Started on diuretics.  Monitor intake and output, daily weights.  Appreciate cardiology recommendations.    Elevated troponins: probably from demand ischemia from sepsis and heart failure. She denies any chest pain.   ICD discharge: evaluated by cardiology and recommendations given.   Code Status: full code.  Family Communication: none at bedside.  Disposition Plan: pending PT eval, possible d/c in am    Consultants:  cardiology  Procedures:  Venous duplex  Echocardiogram.  Antibiotics:  Vancomycin   Cefepime.   HPI/Subjective: She is feeling better. No new complaints. Leg pain better.   Objective: Filed Vitals:   03/10/15 1420  BP: 119/58  Pulse: 69  Temp: 98.8 F (37.1 C)  Resp: 17    Intake/Output Summary (Last 24 hours) at 03/10/15 1733 Last data filed at 03/10/15 1559  Gross per 24 hour  Intake   1482 ml  Output   3450 ml  Net  -1968 ml   Filed Weights   03/09/15 0432 03/09/15 1300 03/10/15 0400  Weight: 70.5 kg (155 lb 6.8 oz) 66.2 kg (145 lb 15.1 oz) 69 kg (152 lb 1.9 oz)    Exam:   General:  Alert afebrile comfortable  Cardiovascular: s1s2  Respiratory: clear to auscultation, no wheezing or rhonchi  Abdomen: soft non tender non distended bowel sounds heard  Musculoskeletal:  bilateral  Cellulitis of the lower extremities.   Data Reviewed: Basic Metabolic Panel:  Recent Labs Lab 03/08/15 2333 03/09/15 0323 03/09/15 0335 03/10/15 0218  NA 138 135  --  140  K 3.4* 3.1*  --  4.0  CL 100 99  --  103  CO2 24 24  --  26  GLUCOSE 122* 194*  --  131*  BUN 30* 31*  --  30*  CREATININE 1.35* 1.37*  --  1.36*  CALCIUM 9.1 8.6  --  9.1  MG 1.6  --  1.6 2.4  PHOS 2.2*  --  2.3  --    Liver Function Tests:  Recent Labs Lab 03/08/15 2333 03/09/15 0323  AST 32 27  ALT 26 20  ALKPHOS 80 71  BILITOT 2.7* 2.6*  PROT 6.7 5.9*  ALBUMIN 3.1* 2.7*   No results for input(s): LIPASE, AMYLASE in the last 168 hours. No results for input(s): AMMONIA in the last 168 hours. CBC:  Recent Labs Lab 03/08/15 2333 03/09/15 0323 03/10/15 0218  WBC 23.3* 20.0* 12.2*  NEUTROABS 19.8* 16.8*  --   HGB 11.2* 10.3* 10.3*  HCT 35.4* 32.2* 31.7*  MCV 91.0 91.5 90.3  PLT 166 153 156   Cardiac Enzymes:  Recent Labs Lab 03/08/15 2333 03/09/15 0259 03/09/15 0929  CKTOTAL 19  --   --   CKMB 2.0  --   --   TROPONINI 0.10* 0.07* 0.06*   BNP (last 3 results)  Recent Labs  03/08/15 2333  BNP 698.3*  ProBNP (last 3 results)  Recent Labs  03/29/14 1607  PROBNP 2862.0*    CBG: No results for input(s): GLUCAP in the last 168 hours.  Recent Results (from the past 240 hour(s))  MRSA PCR Screening     Status: None   Collection Time: 03/08/15  8:37 PM  Result Value Ref Range Status   MRSA by PCR NEGATIVE NEGATIVE Final    Comment:        The GeneXpert MRSA Assay (FDA approved for NASAL specimens only), is one component of a comprehensive MRSA colonization surveillance program. It is not intended to diagnose MRSA infection nor to guide or monitor treatment for MRSA infections.   Culture, blood (routine x 2)     Status: None (Preliminary result)   Collection Time: 03/08/15 11:20 PM  Result Value Ref Range Status   Specimen Description BLOOD RIGHT  ANTECUBITAL  Final   Special Requests   Final    BOTTLES DRAWN AEROBIC AND ANAEROBIC 5CC AER 1CC ANA   Culture   Final           BLOOD CULTURE RECEIVED NO GROWTH TO DATE CULTURE WILL BE HELD FOR 5 DAYS BEFORE ISSUING A FINAL NEGATIVE REPORT Performed at [facility]    Report Status PENDING  Incomplete  Culture, blood (routine x 2)     Status: None (Preliminary result)   Collection Time: 03/08/15 11:33 PM  Result Value Ref Range Status   Specimen Description BLOOD BLOOD RIGHT FOREARM  Final   Special Requests BOTTLES DRAWN AEROBIC AND ANAEROBIC 3CC  Final   Culture   Final           BLOOD CULTURE RECEIVED NO GROWTH TO DATE CULTURE WILL BE HELD FOR 5 DAYS BEFORE ISSUING A FINAL NEGATIVE REPORT Performed at [facility]    Report Status PENDING  Incomplete     Studies: Dg Chest Port 1 View  03/09/2015   CLINICAL DATA:  Follow-up cough  EXAM: PORTABLE CHEST - 1 VIEW  COMPARISON:  04/01/2013  FINDINGS: Cardiomegaly persists. Low volumes. Left jugular venous catheter removed. Right subclavian AICD stable.  Left basilar airspace disease improved. Right upper lobe airspace disease improved. Bilateral lower lung linear atelectasis persists. No pneumothorax. Pleural parenchymal opacity at the left apex is likely chronic.  IMPRESSION: Improved bilateral airspace disease.   Electronically Signed   By: Marybelle Killings M.D.   On: 03/09/2015 12:16    Scheduled Meds: . aspirin EC  81 mg Oral QPM  . ceFEPime (MAXIPIME) IV  1 g Intravenous Q24H  . ferrous sulfate  325 mg Oral Q breakfast  . folic acid  2 mg Oral Daily  . heparin  5,000 Units Subcutaneous 3 times per day  . letrozole  2.5 mg Oral Daily  . levothyroxine  125 mcg Oral QAC breakfast  . lisinopril  5 mg Oral Daily  . metoprolol tartrate  25 mg Oral BID  . simvastatin  10 mg Oral QHS  . sodium chloride  3 mL Intravenous Q12H  . spironolactone  12.5 mg Oral Daily  . temazepam  30 mg Oral QHS  . torsemide  40 mg Oral BID   . 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     [facility] Pager [phone]. If 7PM-7AM, please contact night-coverage at [email or web address], password [facility] 03/10/2015, 5:33 PM  LOS: 2 days

## 2015-03-10 NOTE — Discharge Instructions (Signed)
No driving x6 months Make all of your doctors aware that you have QT prolongation and that all medications should be checked to be sure they do not prolong your QT interval

## 2015-03-10 NOTE — Progress Notes (Signed)
Advanced Heart Failure Rounding Note   Subjective:   Admitted after ICD shock--> VT and decompensated heart failure.  EP consulted this am. Metoprolol resumed.   Denies SOB/Orthopnea.    Blood cultures -NGTD  K 4.0  Creatinine 1.36  Mag 2.4    Objective:   Weight Range:  Vital Signs:   Temp:  [97.6 F (36.4 C)-99.4 F (37.4 C)] 98.2 F (36.8 C) (04/29 0756) Pulse Rate:  [70-79] 70 (04/29 0756) Resp:  [11-19] 11 (04/29 0756) BP: (96-124)/(37-79) 96/38 mmHg (04/29 0756) SpO2:  [97 %-100 %] 97 % (04/29 0756) Weight:  [145 lb 15.1 oz (66.2 kg)-152 lb 1.9 oz (69 kg)] 152 lb 1.9 oz (69 kg) (04/29 0400) Last BM Date: 03/09/15  Weight change: Filed Weights   03/09/15 0432 03/09/15 1300 03/10/15 0400  Weight: 155 lb 6.8 oz (70.5 kg) 145 lb 15.1 oz (66.2 kg) 152 lb 1.9 oz (69 kg)    Intake/Output:   Intake/Output Summary (Last 24 hours) at 03/10/15 0800 Last data filed at 03/10/15 0600  Gross per 24 hour  Intake   1380 ml  Output   4500 ml  Net  -3120 ml     Physical Exam: General:  Well appearing. No resp difficulty. Sitting in chair.  HEENT: normal Neck: supple. JVP 7-8  . Carotids 2+ bilat; no bruits. No lymphadenopathy or thryomegaly appreciated. Cor: PMI nondisplaced. Regular rate & rhythm. No rubs, gallops or murmurs. Lungs: clear Abdomen: soft, nontender, nondistended. No hepatosplenomegaly. No bruits or masses. Good bowel sounds. Extremities: no cyanosis, clubbing, rash, no lower extremity edema.  Neuro: alert & orientedx3, cranial nerves grossly intact. moves all 4 extremities w/o difficulty. Affect pleasant   Labs: Basic Metabolic Panel:  Recent Labs Lab 03/08/15 2333 03/09/15 0323 03/09/15 0335 03/10/15 0218  NA 138 135  --  140  K 3.4* 3.1*  --  4.0  CL 100 99  --  103  CO2 24 24  --  26  GLUCOSE 122* 194*  --  131*  BUN 30* 31*  --  30*  CREATININE 1.35* 1.37*  --  1.36*  CALCIUM 9.1 8.6  --  9.1  MG 1.6  --  1.6 2.4  PHOS 2.2*  --  2.3   --     Liver Function Tests:  Recent Labs Lab 03/08/15 2333 03/09/15 0323  AST 32 27  ALT 26 20  ALKPHOS 80 71  BILITOT 2.7* 2.6*  PROT 6.7 5.9*  ALBUMIN 3.1* 2.7*   No results for input(s): LIPASE, AMYLASE in the last 168 hours. No results for input(s): AMMONIA in the last 168 hours.  CBC:  Recent Labs Lab 03/08/15 2333 03/09/15 0323 03/10/15 0218  WBC 23.3* 20.0* 12.2*  NEUTROABS 19.8* 16.8*  --   HGB 11.2* 10.3* 10.3*  HCT 35.4* 32.2* 31.7*  MCV 91.0 91.5 90.3  PLT 166 153 156    Cardiac Enzymes:  Recent Labs Lab 03/08/15 2333 03/09/15 0259 03/09/15 0929  CKTOTAL 19  --   --   CKMB 2.0  --   --   TROPONINI 0.10* 0.07* 0.06*    BNP: BNP (last 3 results)  Recent Labs  03/08/15 2333  BNP 698.3*    ProBNP (last 3 results)  Recent Labs  03/29/14 1607  PROBNP 2862.0*      Other results:  Imaging: Dg Chest Port 1 View  03/09/2015   CLINICAL DATA:  Follow-up cough  EXAM: PORTABLE CHEST - 1 VIEW  COMPARISON:  04/01/2013  FINDINGS: Cardiomegaly persists. Low volumes. Left jugular venous catheter removed. Right subclavian AICD stable.  Left basilar airspace disease improved. Right upper lobe airspace disease improved. Bilateral lower lung linear atelectasis persists. No pneumothorax. Pleural parenchymal opacity at the left apex is likely chronic.  IMPRESSION: Improved bilateral airspace disease.   Electronically Signed   By: Marybelle Killings M.D.   On: 03/09/2015 12:16      Medications:     Scheduled Medications: . aspirin EC  81 mg Oral QPM  . ceFEPime (MAXIPIME) IV  1 g Intravenous Q24H  . ferrous sulfate  325 mg Oral Q breakfast  . folic acid  2 mg Oral Daily  . heparin  5,000 Units Subcutaneous 3 times per day  . letrozole  2.5 mg Oral Daily  . levothyroxine  125 mcg Oral QAC breakfast  . lisinopril  5 mg Oral Daily  . simvastatin  10 mg Oral QHS  . sodium chloride  3 mL Intravenous Q12H  . temazepam  30 mg Oral QHS  . torsemide  40 mg  Oral BID  . vancomycin  1,000 mg Intravenous Q24H     Infusions:     PRN Medications:  acetaminophen **OR** acetaminophen, LORazepam, morphine injection, nitroGLYCERIN, oxyCODONE-acetaminophen **AND** oxyCODONE, senna-docusate   Assessment:  1. ICD Shocks--VT VF  2. Chronic diastolic CHF with prominent RV failure: Echo (4/16) with EF 55-60%, moderately dilated and moderately dysfunctional right ventricle.  Moderate TR, PA systolic pressure 63 mmHg.  3. Troponin Elevated 4. Leukocytosis- Blood cultures - NGTD, remains on cefepime 5. Chronic atrial fibrillation   Plan/Discussion:    K and magnesium ok today. EP evaluated . BB restarted. Need to keep K >3.9  and Mag > 2.0   Volume status ok. Continue torsemide at current dose. Resume BB. Continue lisinopril 5 mg daily. Add 12.5 mg spiro daily. Check BMET in am .    Consult cardiac rehab.   Will set up follow up in HF clinic.   Length of Stay: 2   CLEGG,AMY  NP-C   03/10/2015, 8:00 AM  Advanced Heart Failure Team Pager [phone] (M-F; [place])  Please contact [facility] for night-coverage after hours (4p -7a ) and weekends on [email or web address]  Patient seen with NP, agree with the above note.  She is stable today.  Labs ok.  Seen by EP, suspect TdP due to hypokalemia + azithromycin.  Asked patient to check with cardiology when antibiotics are prescribed.  Volume status looks ok.   From cardiac standpoint, I think she can go home on current medications with addition of KCl 40 daily.  She is on cefepime for ?cellulitis per [facility].   Loralie Champagne 03/10/2015 3:04 PM

## 2015-03-10 NOTE — Progress Notes (Signed)
Patient is being transferred to [facility] via wheelchair by staff. No c/o pain. VSS. Report was given to [place], [place]

## 2015-03-11 DIAGNOSIS — I5033 Acute on chronic diastolic (congestive) heart failure: Secondary | ICD-10-CM

## 2015-03-11 DIAGNOSIS — Z9229 Personal history of other drug therapy: Secondary | ICD-10-CM

## 2015-03-11 LAB — BASIC METABOLIC PANEL
Anion gap: 9 (ref 5–15)
BUN: 28 mg/dL — AB (ref 6–23)
CHLORIDE: 98 mmol/L (ref 96–112)
CO2: 34 mmol/L — AB (ref 19–32)
CREATININE: 1.31 mg/dL — AB (ref 0.50–1.10)
Calcium: 9.3 mg/dL (ref 8.4–10.5)
GFR calc Af Amer: 46 mL/min — ABNORMAL LOW (ref >90)
GFR calc non Af Amer: 40 mL/min — ABNORMAL LOW (ref >90)
Glucose, Bld: 143 mg/dL — ABNORMAL HIGH (ref 70–99)
Potassium: 3.7 mmol/L (ref 3.5–5.1)
Sodium: 141 mmol/L (ref 135–145)

## 2015-03-11 LAB — GLUCOSE, CAPILLARY: GLUCOSE-CAPILLARY: 141 mg/dL — AB (ref 70–99)

## 2015-03-11 LAB — MAGNESIUM: MAGNESIUM: 1.7 mg/dL (ref 1.5–2.5)

## 2015-03-11 MED ORDER — DOXYCYCLINE HYCLATE 100 MG PO TABS
100.0000 mg | ORAL_TABLET | Freq: Two times a day (BID) | ORAL | Status: DC
  Administered 2015-03-11 – 2015-03-12 (×3): 100 mg via ORAL
  Filled 2015-03-11 (×4): qty 1

## 2015-03-11 MED ORDER — POTASSIUM CHLORIDE CRYS ER 20 MEQ PO TBCR
40.0000 meq | EXTENDED_RELEASE_TABLET | Freq: Once | ORAL | Status: AC
  Administered 2015-03-11: 40 meq via ORAL
  Filled 2015-03-11: qty 2

## 2015-03-11 MED ORDER — POTASSIUM CHLORIDE CRYS ER 20 MEQ PO TBCR
40.0000 meq | EXTENDED_RELEASE_TABLET | Freq: Every day | ORAL | Status: DC
Start: 2015-03-12 — End: 2015-03-12
  Administered 2015-03-12: 40 meq via ORAL
  Filled 2015-03-11: qty 2

## 2015-03-11 MED ORDER — MAGNESIUM SULFATE 2 GM/50ML IV SOLN
2.0000 g | Freq: Once | INTRAVENOUS | Status: AC
  Administered 2015-03-11: 2 g via INTRAVENOUS
  Filled 2015-03-11: qty 50

## 2015-03-11 NOTE — Progress Notes (Signed)
PT recommends HH PT and shower chair; notified MD.  Rowe Pavy, RN

## 2015-03-11 NOTE — Progress Notes (Signed)
Subjective:    No complaints  Objective:   Temp:  [98.1 F (36.7 C)-98.8 F (37.1 C)] 98.1 F (36.7 C) (04/30 0339) Pulse Rate:  [69-71] 70 (04/30 0953) Resp:  [17-18] 18 (04/30 0339) BP: (110-119)/(58-66) 110/62 mmHg (04/30 0953) SpO2:  [100 %] 100 % (04/30 0339) Weight:  [143 lb 15.4 oz (65.3 kg)] 143 lb 15.4 oz (65.3 kg) (04/30 0339) Last BM Date: 03/10/15  Filed Weights   03/09/15 1300 03/10/15 0400 03/11/15 0339  Weight: 145 lb 15.1 oz (66.2 kg) 152 lb 1.9 oz (69 kg) 143 lb 15.4 oz (65.3 kg)    Intake/Output Summary (Last 24 hours) at 03/11/15 1255 Last data filed at 03/11/15 0937  Gross per 24 hour  Intake    684 ml  Output   1850 ml  Net  -1166 ml    Telemetry: A paced V-sensed  Exam:  General: NAD   Resp: CTAB  Cardiac: RRR, 3/6 systolic murmur at apex  GI: abdomen soft, NT, ND  MSK: no LE edema  Neuro: no focal deficits  Psych: appropriate affect  Lab Results:  Basic Metabolic Panel:  Recent Labs Lab 03/09/15 0323 03/09/15 0335 03/10/15 0218 03/11/15 0300  NA 135  --  140 141  K 3.1*  --  4.0 3.7  CL 99  --  103 98  CO2 24  --  26 34*  GLUCOSE 194*  --  131* 143*  BUN 31*  --  30* 28*  CREATININE 1.37*  --  1.36* 1.31*  CALCIUM 8.6  --  9.1 9.3  MG  --  1.6 2.4 1.7    Liver Function Tests:  Recent Labs Lab 03/08/15 2333 03/09/15 0323  AST 32 27  ALT 26 20  ALKPHOS 80 71  BILITOT 2.7* 2.6*  PROT 6.7 5.9*  ALBUMIN 3.1* 2.7*    CBC:  Recent Labs Lab 03/08/15 2333 03/09/15 0323 03/10/15 0218  WBC 23.3* 20.0* 12.2*  HGB 11.2* 10.3* 10.3*  HCT 35.4* 32.2* 31.7*  MCV 91.0 91.5 90.3  PLT 166 153 156    Cardiac Enzymes:  Recent Labs Lab 03/08/15 2333 03/09/15 0259 03/09/15 0929  CKTOTAL 19  --   --   CKMB 2.0  --   --   TROPONINI 0.10* 0.07* 0.06*    BNP:  Recent Labs  03/29/14 1607  PROBNP 2862.0*    Coagulation:  Recent Labs Lab 03/08/15 2333 03/09/15 0323  INR 1.53* 1.70*     ECG:   Medications:   Scheduled Medications: . aspirin EC  81 mg Oral QPM  . ceFEPime (MAXIPIME) IV  1 g Intravenous Q24H  . ferrous sulfate  325 mg Oral Q breakfast  . folic acid  2 mg Oral Daily  . heparin  5,000 Units Subcutaneous 3 times per day  . letrozole  2.5 mg Oral Daily  . levothyroxine  125 mcg Oral QAC breakfast  . lisinopril  5 mg Oral Daily  . metoprolol tartrate  25 mg Oral BID  . simvastatin  10 mg Oral QHS  . sodium chloride  3 mL Intravenous Q12H  . spironolactone  12.5 mg Oral Daily  . temazepam  30 mg Oral QHS  . torsemide  40 mg Oral BID  . vancomycin  1,000 mg Intravenous Q24H     Infusions:     PRN Medications:  acetaminophen **OR** acetaminophen, LORazepam, morphine injection, nitroGLYCERIN, oxyCODONE-acetaminophen **AND** oxyCODONE, senna-docusate     Assessment/Plan    1. VT/VF  -  EP note reviewed, patient received appropriate ICD shock for PMVT, she has hx of prolonged QT and Torsades. Suspected related to hypokalemia and potentially abx - no driving x 6 months per ER recs - will need close f/u with Dr Rayann Heman in [place] at discharge - K 3.7 today, she received KCl 58mEq x1. Mg 1.7, she has received magnesium IV x 2 grams   2. Chronic systolic HF 07/1915 echo LVEF 55-60%, elevated LA pressures - euvolemic, continue current meds   3. Sepsis  - management per primary team    Carlyle Dolly, M.D.

## 2015-03-11 NOTE — Progress Notes (Addendum)
[facility] PROGRESS NOTE  [name] ZSW:[id number] DOB: 04-Jan-1941 DOA: 03/08/2015 PCP: Burton Apley, MD  Assessment/Plan: 1. Sepsis from cellulitis of the lower extremities/ questionable pneumonia: Admitted to step down, started on broad spectrum antibiotics. BLOOD cultures ordered and negative so far.her cellulitis is improving, d/c iv antibiotics and watch her on oral doxycycline over the next 24 hours.    Hypotension: Improved.   Leukocytosis: probably from the sepsis from cellulitis. Improving, repeat cbc in am.    Hypothyroidism: TSH within normal limits.  Resume synthroid.    Hypokalemia: Replete as needed.  Repeat in am  Is normal.    Hypomagnesemia: Replete as needed and repeat level is normal.   Acute on chronic systolic and diastolic heart failure: euvolemic.  Elevated BNP.  Started on diuretics.  Monitor intake and output, daily weights.  Appreciate cardiology recommendations.    Elevated troponins: probably from demand ischemia from sepsis and heart failure. She denies any chest pain.   ICD discharge: evaluated by cardiology and recommendations given.     Code Status: full code.  Family Communication: none at bedside.  Disposition Plan: d/c home in am. With home health PT.    Consultants:  cardiology  Procedures:  Venous duplex  Echocardiogram.  Antibiotics:  Vancomycin   Cefepime.   HPI/Subjective: She is feeling better. No new complaints. Leg pain better.   Objective: Filed Vitals:   03/11/15 1455  BP: 112/43  Pulse: 70  Temp: 98.5 F (36.9 C)  Resp: 18    Intake/Output Summary (Last 24 hours) at 03/11/15 1709 Last data filed at 03/11/15 1318  Gross per 24 hour  Intake    702 ml  Output   2150 ml  Net  -1448 ml   Filed Weights   03/09/15 1300 03/10/15 0400 03/11/15 0339  Weight: 66.2 kg (145 lb 15.1 oz) 69 kg (152 lb 1.9 oz) 65.3 kg (143 lb 15.4 oz)    Exam:   General:  Alert afebrile  comfortable  Cardiovascular: s1s2  Respiratory: clear to auscultation, no wheezing or rhonchi  Abdomen: soft non tender non distended bowel sounds heard  Musculoskeletal: bilateral  Cellulitis of the lower extremities.   Data Reviewed: Basic Metabolic Panel:  Recent Labs Lab 03/08/15 2333 03/09/15 0323 03/09/15 0335 03/10/15 0218 03/11/15 0300  NA 138 135  --  140 141  K 3.4* 3.1*  --  4.0 3.7  CL 100 99  --  103 98  CO2 24 24  --  26 34*  GLUCOSE 122* 194*  --  131* 143*  BUN 30* 31*  --  30* 28*  CREATININE 1.35* 1.37*  --  1.36* 1.31*  CALCIUM 9.1 8.6  --  9.1 9.3  MG 1.6  --  1.6 2.4 1.7  PHOS 2.2*  --  2.3  --   --    Liver Function Tests:  Recent Labs Lab 03/08/15 2333 03/09/15 0323  AST 32 27  ALT 26 20  ALKPHOS 80 71  BILITOT 2.7* 2.6*  PROT 6.7 5.9*  ALBUMIN 3.1* 2.7*   No results for input(s): LIPASE, AMYLASE in the last 168 hours. No results for input(s): AMMONIA in the last 168 hours. CBC:  Recent Labs Lab 03/08/15 2333 03/09/15 0323 03/10/15 0218  WBC 23.3* 20.0* 12.2*  NEUTROABS 19.8* 16.8*  --   HGB 11.2* 10.3* 10.3*  HCT 35.4* 32.2* 31.7*  MCV 91.0 91.5 90.3  PLT 166 153 156   Cardiac Enzymes:  Recent Labs Lab 03/08/15 2333  03/09/15 0259 03/09/15 0929  CKTOTAL 19  --   --   CKMB 2.0  --   --   TROPONINI 0.10* 0.07* 0.06*   BNP (last 3 results)  Recent Labs  03/08/15 2333  BNP 698.3*    ProBNP (last 3 results)  Recent Labs  03/29/14 1607  PROBNP 2862.0*    CBG: No results for input(s): GLUCAP in the last 168 hours.  Recent Results (from the past 240 hour(s))  MRSA PCR Screening     Status: None   Collection Time: 03/08/15  8:37 PM  Result Value Ref Range Status   MRSA by PCR NEGATIVE NEGATIVE Final    Comment:        The GeneXpert MRSA Assay (FDA approved for NASAL specimens only), is one component of a comprehensive MRSA colonization surveillance program. It is not intended to diagnose MRSA infection  nor to guide or monitor treatment for MRSA infections.   Culture, blood (routine x 2)     Status: None (Preliminary result)   Collection Time: 03/08/15 11:20 PM  Result Value Ref Range Status   Specimen Description BLOOD RIGHT ANTECUBITAL  Final   Special Requests   Final    BOTTLES DRAWN AEROBIC AND ANAEROBIC 5CC AER 1CC ANA   Culture   Final           BLOOD CULTURE RECEIVED NO GROWTH TO DATE CULTURE WILL BE HELD FOR 5 DAYS BEFORE ISSUING A FINAL NEGATIVE REPORT Performed at [facility]    Report Status PENDING  Incomplete  Culture, blood (routine x 2)     Status: None (Preliminary result)   Collection Time: 03/08/15 11:33 PM  Result Value Ref Range Status   Specimen Description BLOOD BLOOD RIGHT FOREARM  Final   Special Requests BOTTLES DRAWN AEROBIC AND ANAEROBIC 3CC  Final   Culture   Final           BLOOD CULTURE RECEIVED NO GROWTH TO DATE CULTURE WILL BE HELD FOR 5 DAYS BEFORE ISSUING A FINAL NEGATIVE REPORT Performed at [facility]    Report Status PENDING  Incomplete     Studies: No results found.  Scheduled Meds: . aspirin EC  81 mg Oral QPM  . doxycycline  100 mg Oral Q12H  . ferrous sulfate  325 mg Oral Q breakfast  . folic acid  2 mg Oral Daily  . heparin  5,000 Units Subcutaneous 3 times per day  . letrozole  2.5 mg Oral Daily  . levothyroxine  125 mcg Oral QAC breakfast  . lisinopril  5 mg Oral Daily  . metoprolol tartrate  25 mg Oral BID  . [START ON 03/12/2015] potassium chloride  40 mEq Oral Daily  . simvastatin  10 mg Oral QHS  . sodium chloride  3 mL Intravenous Q12H  . spironolactone  12.5 mg Oral Daily  . temazepam  30 mg Oral QHS  . 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     [facility] Pager [phone]. If 7PM-7AM, please contact night-coverage at  [email or web address], password [facility] 03/11/2015, 5:09 PM  LOS: 3 days

## 2015-03-11 NOTE — Evaluation (Signed)
Physical Therapy Evaluation Patient Details Name: [name] MRN: [id number] DOB: 08/05/1941 Today's Date: 03/11/2015   History of Present Illness  Pt is a 74 y/o female with a PMH of chronic combined CHF, hypothyroidism, anemia, GERD, brest cancer s/p chemo and radiation, pulmonary HTN, GI bleed hx, and CAD. Pt presents with chest congestion and progressive SOB. Pt was admitted with pneumonia.  Clinical Impression  Pt admitted with above diagnosis. Pt currently with functional limitations due to the deficits listed below (see PT Problem List). At the time of PT eval pt was able to perform transfers and ambulation at a supervision to min assist level. Recommended that pt continue to use the rollator at home until she progresses with HHPT. Pt will benefit from skilled PT to increase their independence and safety with mobility to allow discharge to the venue listed below.       Follow Up Recommendations Home health PT;Supervision for mobility/OOB    Equipment Recommendations  Other (comment) (Tub bench)    Recommendations for Other Services       Precautions / Restrictions Precautions Precautions: Fall Restrictions Weight Bearing Restrictions: No      Mobility  Bed Mobility Overal bed mobility: Needs Assistance Bed Mobility: Supine to Sit     Supine to sit: Supervision     General bed mobility comments: Pt was able to transition to EOB with no physical assistance. Increased time and use of bed rails requierd.   Transfers Overall transfer level: Needs assistance Equipment used: None Transfers: Sit to/from [facility] Sit to Stand: Supervision Stand pivot transfers: Supervision       General transfer comment: Pt was able to power-up to full standing and take pivotal steps around to the [facility]. Pt performed peri-care independently.   Ambulation/Gait Ambulation/Gait assistance: Min assist Ambulation Distance (Feet): 150 Feet Assistive device: 1 person  hand held assist Gait Pattern/deviations: Step-through pattern;Decreased stride length;Trunk flexed Gait velocity: Decreased Gait velocity interpretation: Below normal speed for age/gender General Gait Details: Pt wanted to try ambulating without the RW, and required occasional min assist for balance and support. Did better holding onto the IV pole, however will continue to require the rollator at home.   Stairs            Wheelchair Mobility    Modified Rankin (Stroke Patients Only)       Balance Overall balance assessment: Needs assistance Sitting-balance support: Feet supported;No upper extremity supported Sitting balance-Leahy Scale: Good     Standing balance support: Single extremity supported Standing balance-Leahy Scale: Fair                               Pertinent Vitals/Pain Pain Assessment: 0-10 Pain Score: 5  Pain Location: L sideo f neck Pain Descriptors / Indicators: Aching Pain Intervention(s): Limited activity within patient's tolerance;Monitored during session;Repositioned    Home Living Family/patient expects to be discharged to:: Private residence Living Arrangements: Spouse/significant other Available Help at Discharge: Family;Available 24 hours/day Type of Home: House Home Access: Level entry     Home Layout: One level Home Equipment: Walker - 4 wheels      Prior Function Level of Independence: Independent with assistive device(s)         Comments: Uses the rollator in the community. The past few days have been using it in the house as well.      Hand Dominance   Dominant Hand: Right  Extremity/Trunk Assessment   Upper Extremity Assessment: Defer to OT evaluation           Lower Extremity Assessment: Generalized weakness      Cervical / Trunk Assessment: Kyphotic;Other exceptions  Communication   Communication: No difficulties  Cognition Arousal/Alertness: Awake/alert Behavior During Therapy: WFL for  tasks assessed/performed Overall Cognitive Status: Within Functional Limits for tasks assessed                      General Comments      Exercises        Assessment/Plan    PT Assessment Patient needs continued PT services  PT Diagnosis Difficulty walking;Generalized weakness   PT Problem List Decreased strength;Decreased range of motion;Decreased activity tolerance;Decreased balance;Decreased mobility;Decreased knowledge of use of DME;Decreased safety awareness;Decreased knowledge of precautions;Cardiopulmonary status limiting activity  PT Treatment Interventions DME instruction;Gait training;Stair training;Functional mobility training;Therapeutic activities;Therapeutic exercise;Neuromuscular re-education;Patient/family education   PT Goals (Current goals can be found in the Care Plan section) Acute Rehab PT Goals Patient Stated Goal: Return to PLOF PT Goal Formulation: With patient Time For Goal Achievement: 03/18/15 Potential to Achieve Goals: Good    Frequency Min 3X/week   Barriers to discharge        Co-evaluation               End of Session Equipment Utilized During Treatment: Gait belt Activity Tolerance: Patient tolerated treatment well Patient left: in chair;with call bell/phone within reach;with chair alarm set Nurse Communication: Mobility status         Time: 9798-9211 PT Time Calculation (min) (ACUTE ONLY): 27 min   Charges:   PT Evaluation $Initial PT Evaluation Tier I: 1 Procedure PT Treatments $Gait Training: 8-22 mins   PT G Codes:        Rolinda Roan 03-31-15, 11:58 AM  Rolinda Roan, PT, DPT Acute Rehabilitation Services Pager: [phone]

## 2015-03-12 LAB — GLUCOSE, CAPILLARY
GLUCOSE-CAPILLARY: 147 mg/dL — AB (ref 70–99)
GLUCOSE-CAPILLARY: 103 mg/dL — AB (ref 70–99)

## 2015-03-12 LAB — CBC
HCT: 36.8 % (ref 36.0–46.0)
Hemoglobin: 11.6 g/dL — ABNORMAL LOW (ref 12.0–15.0)
MCH: 28.9 pg (ref 26.0–34.0)
MCHC: 31.5 g/dL (ref 30.0–36.0)
MCV: 91.8 fL (ref 78.0–100.0)
PLATELETS: 226 10*3/uL (ref 150–400)
RBC: 4.01 MIL/uL (ref 3.87–5.11)
RDW: 16.7 % — AB (ref 11.5–15.5)
WBC: 13 10*3/uL — AB (ref 4.0–10.5)

## 2015-03-12 LAB — BASIC METABOLIC PANEL
ANION GAP: 13 (ref 5–15)
BUN: 25 mg/dL — ABNORMAL HIGH (ref 6–20)
CALCIUM: 9.9 mg/dL (ref 8.9–10.3)
CHLORIDE: 94 mmol/L — AB (ref 101–111)
CO2: 31 mmol/L (ref 22–32)
Creatinine, Ser: 1.34 mg/dL — ABNORMAL HIGH (ref 0.44–1.00)
GFR calc non Af Amer: 39 mL/min — ABNORMAL LOW (ref >60)
GFR, EST AFRICAN AMERICAN: 45 mL/min — AB (ref >60)
Glucose, Bld: 158 mg/dL — ABNORMAL HIGH (ref 70–99)
Potassium: 3.8 mmol/L (ref 3.5–5.1)
Sodium: 138 mmol/L (ref 135–145)

## 2015-03-12 LAB — MAGNESIUM: Magnesium: 1.8 mg/dL (ref 1.7–2.4)

## 2015-03-12 MED ORDER — AMOXICILLIN-POT CLAVULANATE 875-125 MG PO TABS: 1.0000 | ORAL_TABLET | Freq: Two times a day (BID) | ORAL | Status: DC

## 2015-03-12 MED ORDER — POTASSIUM CHLORIDE CRYS ER 20 MEQ PO TBCR: 40.0000 meq | EXTENDED_RELEASE_TABLET | Freq: Every day | ORAL | Status: DC

## 2015-03-12 MED ORDER — TORSEMIDE 20 MG PO TABS: 40.0000 mg | ORAL_TABLET | Freq: Two times a day (BID) | ORAL | Status: DC

## 2015-03-12 MED ORDER — SPIRONOLACTONE 25 MG PO TABS: 12.5000 mg | ORAL_TABLET | Freq: Every day | ORAL | Status: DC

## 2015-03-12 NOTE — Care Management Note (Signed)
    Page 1 of 2   03/13/2015     11:36:14 AM CARE MANAGEMENT NOTE 03/13/2015  Patient:  [name], [name]   Account Number:  [id number]  Date Initiated:  03/12/2015  Documentation initiated by:  [facility]  Subjective/Objective Assessment:   adm: Referring physician: [facility]  Chief Complaint: Chest congestion     Action/Plan:   discharge planning   Anticipated DC Date:  03/12/2015   Anticipated DC Plan:  [place]  CM consult      [facility] Choice  HOME HEALTH   Choice offered to / List presented to:  C-1 Patient        [place] arranged  HH-1 RN  [facility]   Status of service:  Completed, signed off Medicare Important Message given?   (If response is "NO", the following Medicare IM given date fields will be blank) Date Medicare IM given:   Medicare IM given by:   Date Additional Medicare IM given:   Additional Medicare IM given by:    Discharge Disposition:  [place]  Per UR Regulation:  Reviewed for med. necessity/level of care/duration of stay  If discussed at [place] of Stay Meetings, dates discussed:    Comments:  03/13/15- [place] RN, BSN [phone] F/u Call made to [facility] regarding referral- spoke with [facility]- referral was received and no further info needed.  03/12/15 14:00 Cm received call from RN pt to be discharged with [facility].  CM met with pt to offer choice of home health agency.  Pt chooses [facility] [place] and fax: [phone].  CM reached voicemail of [name] but was referred to on -call RN number where I received fax beep.  CM has faxed the facesheet, F2F, orders, H&P and last progress note to fax number with request of referral.  [name], BSN, Cm [phone]

## 2015-03-12 NOTE — Progress Notes (Signed)
Patient remains stable on her chronic CHF medication regimen. Telemetry reviewed, no arrhythmias noted. No new recommendations from cardiac standpoint today. Sepsis management per priamry team. Keep K at 4 and Mg at 2, avoid QT prolonging medications.   Zandra Abts MD

## 2015-03-12 NOTE — Discharge Summary (Signed)
Physician Discharge Summary  [name] SWF:[id number] DOB: 01-May-1941 DOA: 03/08/2015  PCP: Burton Apley, MD  Admit date: 03/08/2015 Discharge date: 03/12/2015  Time spent: 30 minutes  Recommendations for Outpatient Follow-up:  1. Follow up with PCP in one week.  2. Follow up with cardiology as recommended.  3. Please follow upw ith cbc and BMP in one week.   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   Filed Weights   03/10/15 0400 03/11/15 0339 03/12/15 0358  Weight: 69 kg (152 lb 1.9 oz) 65.3 kg (143 lb 15.4 oz) 63.368 kg (139 lb 11.2 oz)    History of present illness:  74 year old lady admitted for sepsis and ICD discharge  Hospital Course:  1. Sepsis from cellulitis of the lower extremities/ questionable pneumonia: Admitted to step down, started on broad spectrum antibiotics. BLOOD cultures ordered and negative so far.her cellulitis is improving, d/c iv antibiotics and discharged her on oral antibiotics to complete the course. She has been afebrile and her leucocytosis is improving.    Hypotension: Improved.   Leukocytosis: probably from the sepsis from cellulitis. Improving, repeat cbc in one week at PCP office.   Hypothyroidism: TSH within normal limits.  Resume synthroid.    Hypokalemia: Replete as needed.  Repeat in am Is normal.    Hypomagnesemia: Replete as needed and repeat level is normal.   Acute on chronic systolic and diastolic heart failure: euvolemic. Elevated BNP.  Started on diuretics.   Changed her diuretic torsemide from 20 mg bid to 40 mg bid, added on spironolactone and potassium 40 meq daily.  Please keep potassium greater than 4 and mag greater than 2. Follow up with cardiology as recommended.  Please avoid all QT prolonging medications.    Elevated troponins: probably from demand ischemia from sepsis and heart failure. She denies any chest pain.   ICD discharge: evaluated by cardiology and recommendations given.   Procedures:  none  Consultations:  cardiology  Discharge Exam: Filed Vitals:   03/12/15 0359  BP: 132/54  Pulse: 72  Temp: 98.9 F (37.2 C)  Resp: 16    General: alert afebrile comfortable Cardiovascular: s1s2 Respiratory: ctab  Discharge Instructions   Discharge Instructions    Diet - low sodium heart healthy    Complete by:  As directed      Discharge instructions    Complete by:  As directed   Follow up with cardiology as recommended. Please do not drive for 6 months.          Current Discharge Medication List    START taking these medications   Details  amoxicillin-clavulanate (AUGMENTIN) 875-125 MG per tablet Take 1 tablet by mouth 2 (two) times daily. Qty: 8 tablet, Refills: 0    spironolactone (ALDACTONE) 25 MG tablet Take 0.5 tablets (12.5 mg total) by mouth daily. Qty: 30 tablet, Refills: 0      CONTINUE these medications which have CHANGED   Details  potassium chloride SA (K-DUR,KLOR-CON) 20 MEQ tablet Take 2 tablets (40 mEq total) by mouth daily. Takes 52meq in the morning and 15meq at night  Qty: 30 tablet, Refills: 1      CONTINUE these medications which have NOT CHANGED   Details  albuterol (PROVENTIL HFA;VENTOLIN HFA) 108 (90 BASE) MCG/ACT inhaler Inhale 1 puff into the lungs every 6 (six) hours as needed for wheezing or shortness of breath.    aspirin EC 81 MG tablet Take 81 mg by mouth every evening.    cholecalciferol (VITAMIN D) 1000 UNITS tablet Take 1,000 Units by mouth daily.     cyanocobalamin (,VITAMIN B-12,) 1000 MCG/ML injection Inject 1,000 mcg into the muscle every 30 (thirty) days.    ferrous sulfate 325 (65 FE) MG tablet Take 325 mg by mouth daily with breakfast.    folic acid (FOLVITE) 1 MG tablet Take 2 mg by mouth daily.     levothyroxine (SYNTHROID, LEVOTHROID) 125 MCG tablet Take 125 mcg by mouth daily before breakfast.    lisinopril (PRINIVIL,ZESTRIL) 5 MG tablet Take 5 mg by mouth daily.     LORazepam (ATIVAN) 0.5 MG tablet Take 0.5-1 mg by mouth at bedtime as needed for sleep (muscle spasm).     metoprolol tartrate (LOPRESSOR) 25 MG tablet Take 25 mg by mouth 2 (two) times daily.    oxyCODONE-acetaminophen (PERCOCET) 10-325 MG per tablet Take 1 tablet by mouth every 6 (six) hours.     simvastatin (ZOCOR) 10 MG tablet Take 10 mg by mouth at bedtime.    temazepam (RESTORIL) 30 MG capsule Take 30 mg by mouth at bedtime.     thyroid (ARMOUR) 120 MG tablet Take 120 mg by mouth daily before breakfast.    torsemide (DEMADEX) 20 MG tablet Take 20 mg by mouth 2 (two) times daily.     mirtazapine (REMERON) 15 MG tablet Take 15 mg by mouth at bedtime.     nitroGLYCERIN (NITROSTAT) 0.4 MG SL tablet Place 0.4 mg under the tongue every 5 (five) minutes as needed for chest pain.      STOP taking these medications     letrozole (FEMARA) 2.5 MG tablet        Allergies  Allergen Reactions  . Codeine Nausea And Vomiting  . Butorphanol Tartrate     REACTION: migraines, nervous/agitated  . Lipitor [Atorvastatin Calcium] Diarrhea    nightmares  . Revatio [Sildenafil Citrate] Swelling   Follow-up Information    Follow up with Glori Bickers, MD On 03/20/2015.   Specialty:  Cardiology   Why:  at 0840 am in the [facility]--gate code 0080--please bring all medications to appt   Contact information:   [place] [address] [phone]       Follow up with Thompson Grayer, MD On 03/24/2015.    Specialty:  Cardiology   Why:  at 9:15AM   Contact information:   [place] [address] [phone]       Follow up with ELLER,EDWARD, MD. Schedule an appointment as soon as possible for a visit in 1 week.   Specialty:  Family Medicine   Contact information:   [address] [place] [address] [phone]        The results of significant diagnostics from this hospitalization (including imaging, microbiology, ancillary and laboratory) are listed below for reference.    Significant Diagnostic Studies: Dg Chest Port 1 View  03/09/2015   CLINICAL DATA:  Follow-up cough  EXAM: PORTABLE CHEST - 1 VIEW  COMPARISON:  04/01/2013  FINDINGS: Cardiomegaly persists. Low volumes. Left jugular venous catheter removed. Right  subclavian AICD stable.  Left basilar airspace disease improved. Right upper lobe airspace disease improved. Bilateral lower lung linear atelectasis persists. No pneumothorax. Pleural parenchymal opacity at the left apex is likely chronic.  IMPRESSION: Improved bilateral airspace disease.   Electronically Signed   By: Marybelle Killings M.D.   On: 03/09/2015 12:16    Microbiology: Recent Results (from the past 240 hour(s))  MRSA PCR Screening     Status: None   Collection Time: 03/08/15  8:37 PM  Result Value Ref Range Status   MRSA by PCR NEGATIVE NEGATIVE Final    Comment:        The GeneXpert MRSA Assay (FDA approved for NASAL specimens only), is one component of a comprehensive MRSA colonization surveillance program. It is not intended to diagnose MRSA infection nor to guide or monitor treatment for MRSA infections.   Culture, blood (routine x 2)     Status: None (Preliminary result)   Collection Time: 03/08/15 11:20 PM  Result Value Ref Range Status   Specimen Description BLOOD RIGHT ANTECUBITAL  Final   Special Requests   Final    BOTTLES DRAWN AEROBIC AND ANAEROBIC 5CC AER 1CC ANA   Culture   Final           BLOOD CULTURE  RECEIVED NO GROWTH TO DATE CULTURE WILL BE HELD FOR 5 DAYS BEFORE ISSUING A FINAL NEGATIVE REPORT Performed at [facility]    Report Status PENDING  Incomplete  Culture, blood (routine x 2)     Status: None (Preliminary result)   Collection Time: 03/08/15 11:33 PM  Result Value Ref Range Status   Specimen Description BLOOD BLOOD RIGHT FOREARM  Final   Special Requests BOTTLES DRAWN AEROBIC AND ANAEROBIC 3CC  Final   Culture   Final           BLOOD CULTURE RECEIVED NO GROWTH TO DATE CULTURE WILL BE HELD FOR 5 DAYS BEFORE ISSUING A FINAL NEGATIVE REPORT Performed at [facility]    Report Status PENDING  Incomplete     Labs: Basic Metabolic Panel:  Recent Labs Lab 03/08/15 2333 03/09/15 0323 03/09/15 0335 03/10/15 0218 03/11/15 0300 03/12/15 0415  NA 138 135  --  140 141 138  K 3.4* 3.1*  --  4.0 3.7 3.8  CL 100 99  --  103 98 94*  CO2 24 24  --  26 34* 31  GLUCOSE 122* 194*  --  131* 143* 158*  BUN 30* 31*  --  30* 28* 25*  CREATININE 1.35* 1.37*  --  1.36* 1.31* 1.34*  CALCIUM 9.1 8.6  --  9.1 9.3 9.9  MG 1.6  --  1.6 2.4 1.7 1.8  PHOS 2.2*  --  2.3  --   --   --    Liver Function Tests:  Recent Labs Lab 03/08/15 2333 03/09/15 0323  AST 32 27  ALT 26 20  ALKPHOS 80 71  BILITOT 2.7* 2.6*  PROT 6.7 5.9*  ALBUMIN 3.1* 2.7*   No results for input(s): LIPASE, AMYLASE in the last 168 hours. No results for input(s): AMMONIA in the last 168 hours. CBC:  Recent Labs Lab 03/08/15 2333 03/09/15 0323 03/10/15 0218 03/12/15 0415  WBC 23.3* 20.0* 12.2* 13.0*  NEUTROABS 19.8* 16.8*  --   --   HGB 11.2* 10.3* 10.3* 11.6*  HCT 35.4* 32.2* 31.7* 36.8  MCV 91.0 91.5 90.3 91.8  PLT 166 153 156 226   Cardiac Enzymes:  Recent Labs  Lab 03/08/15 2333 03/09/15 0259 03/09/15 0929  CKTOTAL 19  --   --   CKMB 2.0  --   --   TROPONINI 0.10* 0.07* 0.06*   BNP: BNP (last 3 results)  Recent Labs  03/08/15 2333  BNP 698.3*    ProBNP (last 3  results)  Recent Labs  03/29/14 1607  PROBNP 2862.0*    CBG:  Recent Labs Lab 03/11/15 2111 03/12/15 0600  GLUCAP 141* 147*       Signed:  Lyle Niblett  [facility] 03/12/2015, 11:49 AM    u

## 2015-03-15 LAB — CULTURE, BLOOD (ROUTINE X 2)
CULTURE: NO GROWTH
CULTURE: NO GROWTH

## 2015-03-20 ENCOUNTER — Inpatient Hospital Stay (HOSPITAL_COMMUNITY): Admit: 2015-03-20 | Payer: Medicare Other

## 2015-03-24 ENCOUNTER — Encounter: Payer: Self-pay | Admitting: Internal Medicine

## 2015-03-24 ENCOUNTER — Encounter: Payer: Medicare Other | Admitting: Internal Medicine

## 2015-03-28 ENCOUNTER — Ambulatory Visit (HOSPITAL_COMMUNITY)
Admission: RE | Admit: 2015-03-28 | Discharge: 2015-03-28 | Disposition: A | Discharge status: Home / Self Care | Payer: Medicare Other | Source: Ambulatory Visit | Attending: Cardiology | Admitting: Cardiology

## 2015-03-28 VITALS — BP 110/68 | Wt 147.8 lb

## 2015-03-28 DIAGNOSIS — N189 Chronic kidney disease, unspecified: Secondary | ICD-10-CM | POA: Diagnosis not present

## 2015-03-28 DIAGNOSIS — M797 Fibromyalgia: Secondary | ICD-10-CM | POA: Diagnosis not present

## 2015-03-28 DIAGNOSIS — Z953 Presence of xenogenic heart valve: Secondary | ICD-10-CM | POA: Insufficient documentation

## 2015-03-28 DIAGNOSIS — I129 Hypertensive chronic kidney disease with stage 1 through stage 4 chronic kidney disease, or unspecified chronic kidney disease: Secondary | ICD-10-CM | POA: Insufficient documentation

## 2015-03-28 DIAGNOSIS — E1122 Type 2 diabetes mellitus with diabetic chronic kidney disease: Secondary | ICD-10-CM | POA: Diagnosis not present

## 2015-03-28 DIAGNOSIS — Z7982 Long term (current) use of aspirin: Secondary | ICD-10-CM | POA: Insufficient documentation

## 2015-03-28 DIAGNOSIS — I472 Ventricular tachycardia, unspecified: Secondary | ICD-10-CM

## 2015-03-28 DIAGNOSIS — Z954 Presence of other heart-valve replacement: Secondary | ICD-10-CM | POA: Diagnosis not present

## 2015-03-28 DIAGNOSIS — J45909 Unspecified asthma, uncomplicated: Secondary | ICD-10-CM | POA: Insufficient documentation

## 2015-03-28 DIAGNOSIS — I071 Rheumatic tricuspid insufficiency: Secondary | ICD-10-CM

## 2015-03-28 DIAGNOSIS — Z853 Personal history of malignant neoplasm of breast: Secondary | ICD-10-CM | POA: Diagnosis not present

## 2015-03-28 DIAGNOSIS — R0789 Other chest pain: Secondary | ICD-10-CM | POA: Diagnosis not present

## 2015-03-28 DIAGNOSIS — K219 Gastro-esophageal reflux disease without esophagitis: Secondary | ICD-10-CM | POA: Insufficient documentation

## 2015-03-28 DIAGNOSIS — E039 Hypothyroidism, unspecified: Secondary | ICD-10-CM | POA: Diagnosis not present

## 2015-03-28 DIAGNOSIS — Z79899 Other long term (current) drug therapy: Secondary | ICD-10-CM | POA: Insufficient documentation

## 2015-03-28 DIAGNOSIS — I480 Paroxysmal atrial fibrillation: Secondary | ICD-10-CM

## 2015-03-28 DIAGNOSIS — Z9221 Personal history of antineoplastic chemotherapy: Secondary | ICD-10-CM | POA: Insufficient documentation

## 2015-03-28 DIAGNOSIS — Z923 Personal history of irradiation: Secondary | ICD-10-CM | POA: Insufficient documentation

## 2015-03-28 DIAGNOSIS — I69354 Hemiplegia and hemiparesis following cerebral infarction affecting left non-dominant side: Secondary | ICD-10-CM | POA: Diagnosis not present

## 2015-03-28 DIAGNOSIS — I2510 Atherosclerotic heart disease of native coronary artery without angina pectoris: Secondary | ICD-10-CM | POA: Insufficient documentation

## 2015-03-28 DIAGNOSIS — Z952 Presence of prosthetic heart valve: Secondary | ICD-10-CM

## 2015-03-28 DIAGNOSIS — I5033 Acute on chronic diastolic (congestive) heart failure: Secondary | ICD-10-CM

## 2015-03-28 DIAGNOSIS — I5032 Chronic diastolic (congestive) heart failure: Secondary | ICD-10-CM | POA: Diagnosis present

## 2015-03-28 LAB — BASIC METABOLIC PANEL
Anion gap: 10 (ref 5–15)
BUN: 40 mg/dL — ABNORMAL HIGH (ref 6–20)
CO2: 26 mmol/L (ref 22–32)
CREATININE: 3.04 mg/dL — AB (ref 0.44–1.00)
Calcium: 10.7 mg/dL — ABNORMAL HIGH (ref 8.9–10.3)
Chloride: 99 mmol/L — ABNORMAL LOW (ref 101–111)
GFR, EST AFRICAN AMERICAN: 17 mL/min — AB (ref >60)
GFR, EST NON AFRICAN AMERICAN: 14 mL/min — AB (ref >60)
Glucose, Bld: 100 mg/dL — ABNORMAL HIGH (ref 65–99)
Potassium: 4.8 mmol/L (ref 3.5–5.1)
Sodium: 135 mmol/L (ref 135–145)

## 2015-03-28 LAB — BRAIN NATRIURETIC PEPTIDE: B NATRIURETIC PEPTIDE 5: 220.7 pg/mL — AB (ref 0.0–100.0)

## 2015-03-28 LAB — MAGNESIUM: Magnesium: 2.2 mg/dL (ref 1.7–2.4)

## 2015-03-28 MED ORDER — SPIRONOLACTONE 25 MG PO TABS: 25.0000 mg | ORAL_TABLET | Freq: Every day | ORAL | Status: DC

## 2015-03-28 MED ORDER — MAGNESIUM OXIDE 400 MG PO TABS: 400.0000 mg | ORAL_TABLET | Freq: Every day | ORAL | Status: DC

## 2015-03-28 MED ORDER — POTASSIUM CHLORIDE CRYS ER 20 MEQ PO TBCR: 40.0000 meq | EXTENDED_RELEASE_TABLET | Freq: Two times a day (BID) | ORAL | Status: DC

## 2015-03-28 MED ORDER — TORSEMIDE 20 MG PO TABS: 40.0000 mg | ORAL_TABLET | Freq: Two times a day (BID) | ORAL | Status: DC

## 2015-03-28 NOTE — Patient Instructions (Signed)
Increase Torsemide to 60 mg (3 tabs) every AM and 40 mg (2 tabs) every PM for 2 DAYS ONLY, THEN decrease to 40 mg (2 tabs) Twice daily   Increase Potassium to 40 meq (2 tabs) Twice daily   Increase Spironolactone to 25 mg (1 tab) daily  Start Mag-Ox 400 mg (1 tab) daily  Labs today  Your physician recommends that you schedule a follow-up appointment in: 1 week with labs

## 2015-03-29 NOTE — Progress Notes (Signed)
Patient ID: [name], female   DOB: 11-Sep-1941, 74 y.o.   MRN: [id number]  PCP: Dr Jerolyn Center EP: Dr Lovena Le  HPI: [name] is a 74 yo female with h/o DM2, HTN, L breast CA s/p mastectomy/chemo/XRT 8/08, PUD s/p partial gastrectomy, rheumatic fever with MS/MR/TR with PAH, CVA with transient R sided weakness 12/11, CKD. Status post bioprosthetic mitral valve replacement, tricuspid valve repair, and Maze procedure on September 24, 2011 (pre-op cath minimal CAD). Coumadin stopped due to falls. 03/31/13 S/P DDD ICD. Has not been able to tolerate Revatio in past.  Admitted to [facility] 03/26/13 through 04/04/13  had V fib arrest in setting of hypokalemia. Then had multiple episodes or recurrent VT despite correction of electrolyte abnormalities. Seen by Dr. Caryl Comes and received ICD.   08/12/12 RHC RA = 15 v= 21  RV = 64/3/13  PA = 59/16 (33)  PCW = 14  Fick cardiac output/index = 4.1/2.4  PVR = 7.8 [place]  FA sat = 98%  PA sat = 60%, 63%   02/17/13 Echo  RV dilated but normal systolic function.  LVEF 50%. Moderate to severe TR  Septal bounce RVSP 55-60. IVC small 03/27/13 Echo  EF 55-60%. Moderate to severe TR, PASP 46  Myoview 02/2014 -Low risk stress nuclear study with small, mild, fixed anterior and inferior defects consistent with soft tissue attenuation; no ischemia. LV Ejection Fraction: 61%. LV Wall Motion: NL LV Function; NL Wall Motion   She was admitted in 4/16 after ICD discharge.  She had been put on azithromycin for bronchitis and had polymorphic VT (also hypokalemic).  She does have a history of prolonged QT interval.    Echo (4/16) with EF 55-60%, normal bioprosthetic mitral valve, no aortic stenosis, moderately dilated RV with moderately decreased systolic function, moderate TR, PA systolic pressure 63 mmHg.  She returns for followup.  No further ICD discharges.  She uses a walker when outside the house.  She is short of breath after walking about 50 feet.  This is stable.  Chronic orthopnea. No  lightheadedness or syncope.  No recent falls.  She is in NSR today.  She has had mild nonexertional aching pain across her chest for about 2 years.  This has not changed.   Optivol: fluid index < threshold but impedance trending down.    ECG: NSR, RBBB, LVH, QTc 573 msec  Labs (7/14): Creatinine 1.15, K+4.0 Labs (09/28/13): K 4.5 Creatinine 1.02  Labs (11/16/12): K 5.2 Creatine 1.22  Labs (3/15): K 4.2, creatinine 1.18, LDL 103, HDL 34 Labs (5/16): K 3.8, creatinine 1.34, HCT 36.8, Mg 1.8  ROS: All systems negative except as listed in HPI, PMH and Problem List.  Past Medical History  Diagnosis Date  . Pulmonary hypertension     s/p RHC 4/10 - PAP 86/29, wedge 21-23  . Gastric ulcer with hemorrhage   . Rheumatic fever     Childhood  . Coronary artery disease     Nonobstructive, LVEF 55%  . Mitral regurgitation     a. s/p bioprosthetic MVR  . Tricuspid regurgitation     a. s/p TV repair  . Hypertension   . Stroke     a. 10/2010 with residual L sided weakness  . Asthma     PT. ON OXYGEN 2 L/[place]   . Diabetes mellitus   . Hypothyroidism   . Anemia, pernicious 1979  . Hepatitis     PT. STATES SHE HAD IT WHEN SHE WAS 14 YRS. OLD  .  Arthritis   . GERD (gastroesophageal reflux disease)   . Fibromyalgia   . Squamous cell carcinoma     Left arm  . Breast cancer     2008 s/p chemo and radiation (last tx 4/09) s/p left mastectomy s/p 9 lymph node resection - [name], [place] [facility]  . Atrial fibrillation     a. s/p MAZE  . 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 AICD (automatic cardioverter/defibrillator) present     Current Outpatient Prescriptions  Medication Sig Dispense Refill  . albuterol (PROVENTIL HFA;VENTOLIN HFA) 108 (90 BASE) MCG/ACT inhaler Inhale 1 puff into the lungs every 6 (six) hours as needed for wheezing or shortness of breath.    Marland Kitchen aspirin EC 81 MG tablet Take 81 mg by mouth every evening.    . cholecalciferol (VITAMIN D)  1000 UNITS tablet Take 1,000 Units by mouth daily.    . cyanocobalamin (,VITAMIN B-12,) 1000 MCG/ML injection Inject 1,000 mcg into the muscle every 30 (thirty) days.    . ferrous sulfate 325 (65 FE) MG tablet Take 325 mg by mouth daily with breakfast.    . folic acid (FOLVITE) 1 MG tablet Take 2 mg by mouth daily.     Marland Kitchen levothyroxine (SYNTHROID, LEVOTHROID) 125 MCG tablet Take 125 mcg by mouth daily before breakfast.    . lisinopril (PRINIVIL,ZESTRIL) 5 MG tablet Take 2.5 mg by mouth daily.    Marland Kitchen LORazepam (ATIVAN) 0.5 MG tablet Take 0.5-1 mg by mouth at bedtime as needed for sleep (muscle spasm).     . metoprolol tartrate (LOPRESSOR) 25 MG tablet Take 25 mg by mouth 2 (two) times daily.    . mirtazapine (REMERON) 15 MG tablet Take 15 mg by mouth at bedtime.     . nitroGLYCERIN (NITROSTAT) 0.4 MG SL tablet Place 0.4 mg under the tongue every 5 (five) minutes as needed for chest pain.    Marland Kitchen oxyCODONE-acetaminophen (PERCOCET) 10-325 MG per tablet Take 1 tablet by mouth every 6 (six) hours.     . potassium chloride SA (K-DUR,KLOR-CON) 20 MEQ tablet Take 2 tablets (40 mEq total) by mouth 2 (two) times daily. 120 tablet 3  . simvastatin (ZOCOR) 10 MG tablet Take 10 mg by mouth at bedtime.    Marland Kitchen spironolactone (ALDACTONE) 25 MG tablet Take 1 tablet (25 mg total) by mouth daily. 30 tablet 6  . temazepam (RESTORIL) 30 MG capsule Take 30 mg by mouth at bedtime.     Marland Kitchen thyroid (ARMOUR) 120 MG tablet Take 120 mg by mouth daily before breakfast.    . torsemide (DEMADEX) 20 MG tablet Take 2 tablets (40 mg total) by mouth 2 (two) times daily. 120 tablet 3  . magnesium oxide (MAG-OX) 400 MG tablet Take 1 tablet (400 mg total) by mouth daily. 30 tablet 3   No current facility-administered medications for this encounter.    Filed Vitals:   03/28/15 1448  BP: 110/68  Weight: 147 lb 12.8 oz (67.042 kg)   PHYSICAL EXAM: General:  Chronically ill appearing. No acute distress.No resp difficulty; daughter present.  Ambulated into the clinic with a walker.  HEENT: normal Neck: supple. JVP 14 cm with +CV waves. Carotids 2+ bilaterally; no bruits. No lymphadenopathy or thryomegaly appreciated. Cor: PMI normal. Regular rate & rhythm. No rubs, gallops.  3/6 HSM LLSB.   Lungs:  clear Abdomen: soft, obese,  nontender, mild-moderately distended.No bruits or masses. Good bowel sounds. Extremities: R and L fingers cyanotic. No clubbing.  1+ edema 1/2 to knees bilaterally. Neuro: alert & orientedx3, cranial nerves grossly intact. Moves all 4 extremities w/o difficulty. Affect pleasant.   ASSESSMENT & PLAN:  1) Chronic diastolic HF: EF 13-08% with moderate RV dysfunction and moderate to severe TR (moderate by 4/16 echo).  I suspect that there is a prominent component of RV failure in the face of significant TR.  NYHA III symptoms. Exam suggests volume overload but may be difficult to estimate PCWP in the face of significant TR.  Optivol fluid index is < threshold but thoracic impedance is starting to head dwn .  - Increase torsemide to 60 qam/40 qpm x 2 days then continue torsemide at 40 mg bid.   - May need RHC if volume overload does not turn around quickly.    - BMET/BNP today and in 1 week at followup.  - Reinforced the need and importance of daily weights, a low sodium diet, and fluid restriction (less than 2 L a day). Instructed to call the HF clinic if weight increases more than 3 lbs overnight or 5 lbs in a week.  2) Chest Pain: Atypical.  Last cath 2012 with minimal CAD.  3) Atrial Fibrillation: Paroxysmal, s/p Maze. She is not in atrial fibrillation today.  She has not been anticoagulated due to fall risk.   4) Tricuspid regurgitation: Moderate on last echo in 4/16.  Will need to review to make sure that this is not worse.   5) Mitral valve disease: Patient had bioprosthetic MV replacement.  MV looked ok on last echo.  6) VT: Recent polymorphic VT in setting of prolonged QT interval.  Continue Coreg.  She  needs to be careful with new medications and avoid meds that prolong the QT interval.  Will follow K and Mg.   Follow up next week. Check BMET next week.    Loralie Champagne  03/29/2015

## 2015-04-04 ENCOUNTER — Inpatient Hospital Stay (HOSPITAL_COMMUNITY)
Admission: AD | Admit: 2015-04-04 | Discharge: 2015-04-07 | DRG: 871 | Disposition: A | Discharge status: Home / Self Care | Payer: Medicare Other | Source: Other Acute Inpatient Hospital | Attending: Family Medicine | Admitting: Family Medicine

## 2015-04-04 ENCOUNTER — Encounter (HOSPITAL_COMMUNITY): Payer: Self-pay | Admitting: General Practice

## 2015-04-04 ENCOUNTER — Inpatient Hospital Stay (HOSPITAL_COMMUNITY): Payer: Medicare Other

## 2015-04-04 ENCOUNTER — Inpatient Hospital Stay (HOSPITAL_COMMUNITY): Admission: RE | Admit: 2015-04-04 | Payer: Medicare Other | Source: Ambulatory Visit

## 2015-04-04 DIAGNOSIS — I071 Rheumatic tricuspid insufficiency: Secondary | ICD-10-CM | POA: Diagnosis not present

## 2015-04-04 DIAGNOSIS — Z953 Presence of xenogenic heart valve: Secondary | ICD-10-CM

## 2015-04-04 DIAGNOSIS — Z888 Allergy status to other drugs, medicaments and biological substances status: Secondary | ICD-10-CM | POA: Diagnosis not present

## 2015-04-04 DIAGNOSIS — D510 Vitamin B12 deficiency anemia due to intrinsic factor deficiency: Secondary | ICD-10-CM | POA: Diagnosis not present

## 2015-04-04 DIAGNOSIS — I340 Nonrheumatic mitral (valve) insufficiency: Secondary | ICD-10-CM | POA: Diagnosis not present

## 2015-04-04 DIAGNOSIS — N179 Acute kidney failure, unspecified: Secondary | ICD-10-CM | POA: Diagnosis present

## 2015-04-04 DIAGNOSIS — Z95810 Presence of automatic (implantable) cardiac defibrillator: Secondary | ICD-10-CM

## 2015-04-04 DIAGNOSIS — Z9221 Personal history of antineoplastic chemotherapy: Secondary | ICD-10-CM | POA: Diagnosis not present

## 2015-04-04 DIAGNOSIS — I2510 Atherosclerotic heart disease of native coronary artery without angina pectoris: Secondary | ICD-10-CM | POA: Diagnosis present

## 2015-04-04 DIAGNOSIS — G43909 Migraine, unspecified, not intractable, without status migrainosus: Secondary | ICD-10-CM | POA: Diagnosis not present

## 2015-04-04 DIAGNOSIS — Z8711 Personal history of peptic ulcer disease: Secondary | ICD-10-CM | POA: Diagnosis not present

## 2015-04-04 DIAGNOSIS — L03115 Cellulitis of right lower limb: Secondary | ICD-10-CM | POA: Diagnosis not present

## 2015-04-04 DIAGNOSIS — E785 Hyperlipidemia, unspecified: Secondary | ICD-10-CM | POA: Diagnosis present

## 2015-04-04 DIAGNOSIS — I4510 Unspecified right bundle-branch block: Secondary | ICD-10-CM | POA: Diagnosis present

## 2015-04-04 DIAGNOSIS — Z853 Personal history of malignant neoplasm of breast: Secondary | ICD-10-CM | POA: Diagnosis not present

## 2015-04-04 DIAGNOSIS — N184 Chronic kidney disease, stage 4 (severe): Secondary | ICD-10-CM | POA: Diagnosis not present

## 2015-04-04 DIAGNOSIS — K219 Gastro-esophageal reflux disease without esophagitis: Secondary | ICD-10-CM | POA: Diagnosis not present

## 2015-04-04 DIAGNOSIS — E039 Hypothyroidism, unspecified: Secondary | ICD-10-CM | POA: Diagnosis present

## 2015-04-04 DIAGNOSIS — I272 Other secondary pulmonary hypertension: Secondary | ICD-10-CM | POA: Diagnosis present

## 2015-04-04 DIAGNOSIS — I209 Angina pectoris, unspecified: Secondary | ICD-10-CM | POA: Diagnosis not present

## 2015-04-04 DIAGNOSIS — I5043 Acute on chronic combined systolic (congestive) and diastolic (congestive) heart failure: Secondary | ICD-10-CM | POA: Diagnosis not present

## 2015-04-04 DIAGNOSIS — Z885 Allergy status to narcotic agent status: Secondary | ICD-10-CM

## 2015-04-04 DIAGNOSIS — L03119 Cellulitis of unspecified part of limb: Secondary | ICD-10-CM

## 2015-04-04 DIAGNOSIS — I5042 Chronic combined systolic (congestive) and diastolic (congestive) heart failure: Secondary | ICD-10-CM | POA: Diagnosis present

## 2015-04-04 DIAGNOSIS — Z923 Personal history of irradiation: Secondary | ICD-10-CM | POA: Diagnosis not present

## 2015-04-04 DIAGNOSIS — R079 Chest pain, unspecified: Secondary | ICD-10-CM | POA: Diagnosis present

## 2015-04-04 DIAGNOSIS — E119 Type 2 diabetes mellitus without complications: Secondary | ICD-10-CM | POA: Diagnosis present

## 2015-04-04 DIAGNOSIS — I099 Rheumatic heart disease, unspecified: Secondary | ICD-10-CM | POA: Diagnosis not present

## 2015-04-04 DIAGNOSIS — I129 Hypertensive chronic kidney disease with stage 1 through stage 4 chronic kidney disease, or unspecified chronic kidney disease: Secondary | ICD-10-CM | POA: Diagnosis not present

## 2015-04-04 DIAGNOSIS — Z903 Acquired absence of stomach [part of]: Secondary | ICD-10-CM | POA: Diagnosis not present

## 2015-04-04 DIAGNOSIS — I480 Paroxysmal atrial fibrillation: Secondary | ICD-10-CM | POA: Diagnosis present

## 2015-04-04 DIAGNOSIS — A419 Sepsis, unspecified organism: Principal | ICD-10-CM | POA: Diagnosis present

## 2015-04-04 DIAGNOSIS — M79606 Pain in leg, unspecified: Secondary | ICD-10-CM | POA: Diagnosis present

## 2015-04-04 DIAGNOSIS — I270 Primary pulmonary hypertension: Secondary | ICD-10-CM

## 2015-04-04 DIAGNOSIS — M1990 Unspecified osteoarthritis, unspecified site: Secondary | ICD-10-CM | POA: Diagnosis present

## 2015-04-04 DIAGNOSIS — L03116 Cellulitis of left lower limb: Secondary | ICD-10-CM | POA: Diagnosis not present

## 2015-04-04 DIAGNOSIS — Z8673 Personal history of transient ischemic attack (TIA), and cerebral infarction without residual deficits: Secondary | ICD-10-CM | POA: Diagnosis not present

## 2015-04-04 DIAGNOSIS — Z952 Presence of prosthetic heart valve: Secondary | ICD-10-CM

## 2015-04-04 DIAGNOSIS — Z7982 Long term (current) use of aspirin: Secondary | ICD-10-CM | POA: Diagnosis not present

## 2015-04-04 DIAGNOSIS — E876 Hypokalemia: Secondary | ICD-10-CM | POA: Diagnosis not present

## 2015-04-04 DIAGNOSIS — J45909 Unspecified asthma, uncomplicated: Secondary | ICD-10-CM | POA: Diagnosis present

## 2015-04-04 LAB — CBC WITH DIFFERENTIAL/PLATELET
BASOS ABS: 0 10*3/uL (ref 0.0–0.1)
BASOS PCT: 0 % (ref 0–1)
EOS ABS: 0 10*3/uL (ref 0.0–0.7)
Eosinophils Relative: 0 % (ref 0–5)
HCT: 30.8 % — ABNORMAL LOW (ref 36.0–46.0)
Hemoglobin: 9.8 g/dL — ABNORMAL LOW (ref 12.0–15.0)
LYMPHS ABS: 1.4 10*3/uL (ref 0.7–4.0)
Lymphocytes Relative: 9 % — ABNORMAL LOW (ref 12–46)
MCH: 28.8 pg (ref 26.0–34.0)
MCHC: 31.8 g/dL (ref 30.0–36.0)
MCV: 90.6 fL (ref 78.0–100.0)
Monocytes Absolute: 1 10*3/uL (ref 0.1–1.0)
Monocytes Relative: 7 % (ref 3–12)
Neutro Abs: 12.6 10*3/uL — ABNORMAL HIGH (ref 1.7–7.7)
Neutrophils Relative %: 84 % — ABNORMAL HIGH (ref 43–77)
Platelets: 152 10*3/uL (ref 150–400)
RBC: 3.4 MIL/uL — ABNORMAL LOW (ref 3.87–5.11)
RDW: 17 % — ABNORMAL HIGH (ref 11.5–15.5)
WBC: 15 10*3/uL — ABNORMAL HIGH (ref 4.0–10.5)

## 2015-04-04 LAB — GLUCOSE, CAPILLARY: Glucose-Capillary: 107 mg/dL — ABNORMAL HIGH (ref 65–99)

## 2015-04-04 LAB — COMPREHENSIVE METABOLIC PANEL
ALT: 10 U/L — ABNORMAL LOW (ref 14–54)
ANION GAP: 10 (ref 5–15)
AST: 21 U/L (ref 15–41)
Albumin: 3 g/dL — ABNORMAL LOW (ref 3.5–5.0)
Alkaline Phosphatase: 73 U/L (ref 38–126)
BUN: 21 mg/dL — ABNORMAL HIGH (ref 6–20)
CHLORIDE: 102 mmol/L (ref 101–111)
CO2: 20 mmol/L — AB (ref 22–32)
Calcium: 9.3 mg/dL (ref 8.9–10.3)
Creatinine, Ser: 1.81 mg/dL — ABNORMAL HIGH (ref 0.44–1.00)
GFR calc Af Amer: 31 mL/min — ABNORMAL LOW (ref >60)
GFR, EST NON AFRICAN AMERICAN: 27 mL/min — AB (ref >60)
GLUCOSE: 139 mg/dL — AB (ref 65–99)
Potassium: 4.2 mmol/L (ref 3.5–5.1)
SODIUM: 132 mmol/L — AB (ref 135–145)
Total Bilirubin: 1 mg/dL (ref 0.3–1.2)
Total Protein: 7 g/dL (ref 6.5–8.1)

## 2015-04-04 LAB — PROCALCITONIN: Procalcitonin: 0.5 ng/mL

## 2015-04-04 LAB — TROPONIN I
Troponin I: 0.06 ng/mL — ABNORMAL HIGH (ref <0.031)
Troponin I: 0.03 ng/mL (ref <0.031)

## 2015-04-04 LAB — APTT: APTT: 23 s — AB (ref 24–37)

## 2015-04-04 LAB — LACTIC ACID, PLASMA
LACTIC ACID, VENOUS: 1.5 mmol/L (ref 0.5–2.0)
Lactic Acid, Venous: 1.6 mmol/L (ref 0.5–2.0)

## 2015-04-04 LAB — PROTIME-INR
INR: 1.32 (ref 0.00–1.49)
PROTHROMBIN TIME: 16.5 s — AB (ref 11.6–15.2)

## 2015-04-04 MED ORDER — ALBUTEROL SULFATE (2.5 MG/3ML) 0.083% IN NEBU: 3.0000 mL | INHALATION_SOLUTION | Freq: Four times a day (QID) | RESPIRATORY_TRACT | Status: DC | PRN

## 2015-04-04 MED ORDER — HEPARIN SODIUM (PORCINE) 5000 UNIT/ML IJ SOLN
5000.0000 [IU] | Freq: Three times a day (TID) | INTRAMUSCULAR | Status: DC
  Administered 2015-04-04 – 2015-04-07 (×8): 5000 [IU] via SUBCUTANEOUS
  Filled 2015-04-04 (×10): qty 1

## 2015-04-04 MED ORDER — VANCOMYCIN HCL 10 G IV SOLR
1250.0000 mg | Freq: Once | INTRAVENOUS | Status: AC
  Administered 2015-04-04: 1250 mg via INTRAVENOUS
  Filled 2015-04-04: qty 1250

## 2015-04-04 MED ORDER — MIRTAZAPINE 15 MG PO TABS
15.0000 mg | ORAL_TABLET | Freq: Every day | ORAL | Status: DC
  Administered 2015-04-04 – 2015-04-06 (×3): 15 mg via ORAL
  Filled 2015-04-04 (×4): qty 1

## 2015-04-04 MED ORDER — SODIUM CHLORIDE 0.9 % IJ SOLN
3.0000 mL | Freq: Two times a day (BID) | INTRAMUSCULAR | Status: DC
  Administered 2015-04-04 – 2015-04-07 (×6): 3 mL via INTRAVENOUS

## 2015-04-04 MED ORDER — FOLIC ACID 1 MG PO TABS
2.0000 mg | ORAL_TABLET | Freq: Every day | ORAL | Status: DC
  Administered 2015-04-04 – 2015-04-07 (×4): 2 mg via ORAL
  Filled 2015-04-04 (×4): qty 2

## 2015-04-04 MED ORDER — OXYCODONE-ACETAMINOPHEN 5-325 MG PO TABS
1.0000 | ORAL_TABLET | Freq: Four times a day (QID) | ORAL | Status: DC | PRN
  Administered 2015-04-04 – 2015-04-06 (×3): 1 via ORAL
  Filled 2015-04-04 (×3): qty 1

## 2015-04-04 MED ORDER — METOPROLOL TARTRATE 25 MG PO TABS
25.0000 mg | ORAL_TABLET | Freq: Two times a day (BID) | ORAL | Status: DC
  Administered 2015-04-04 – 2015-04-07 (×6): 25 mg via ORAL
  Filled 2015-04-04 (×7): qty 1

## 2015-04-04 MED ORDER — LORAZEPAM 1 MG PO TABS: 1.0000 mg | ORAL_TABLET | Freq: Every evening | ORAL | Status: DC | PRN

## 2015-04-04 MED ORDER — OXYCODONE-ACETAMINOPHEN 10-325 MG PO TABS: 1.0000 | ORAL_TABLET | Freq: Four times a day (QID) | ORAL | Status: DC | PRN

## 2015-04-04 MED ORDER — TEMAZEPAM 15 MG PO CAPS
30.0000 mg | ORAL_CAPSULE | Freq: Every day | ORAL | Status: DC
  Administered 2015-04-04 – 2015-04-06 (×3): 30 mg via ORAL
  Filled 2015-04-04 (×3): qty 2

## 2015-04-04 MED ORDER — POLYETHYLENE GLYCOL 3350 17 G PO PACK
17.0000 g | PACK | Freq: Every day | ORAL | Status: DC | PRN
  Filled 2015-04-04: qty 1

## 2015-04-04 MED ORDER — ACETAMINOPHEN 650 MG RE SUPP: 650.0000 mg | Freq: Four times a day (QID) | RECTAL | Status: DC | PRN

## 2015-04-04 MED ORDER — SACCHAROMYCES BOULARDII 250 MG PO CAPS
250.0000 mg | ORAL_CAPSULE | Freq: Two times a day (BID) | ORAL | Status: DC
  Administered 2015-04-04 – 2015-04-07 (×7): 250 mg via ORAL
  Filled 2015-04-04 (×8): qty 1

## 2015-04-04 MED ORDER — ACETAMINOPHEN 325 MG PO TABS: 650.0000 mg | ORAL_TABLET | Freq: Four times a day (QID) | ORAL | Status: DC | PRN

## 2015-04-04 MED ORDER — FERROUS SULFATE 325 (65 FE) MG PO TABS
325.0000 mg | ORAL_TABLET | Freq: Every day | ORAL | Status: DC
  Administered 2015-04-04 – 2015-04-07 (×4): 325 mg via ORAL
  Filled 2015-04-04 (×6): qty 1

## 2015-04-04 MED ORDER — LEVOTHYROXINE SODIUM 125 MCG PO TABS
125.0000 ug | ORAL_TABLET | Freq: Every day | ORAL | Status: DC
  Administered 2015-04-05 – 2015-04-07 (×3): 125 ug via ORAL
  Filled 2015-04-04 (×4): qty 1

## 2015-04-04 MED ORDER — PIPERACILLIN-TAZOBACTAM 3.375 G IVPB 30 MIN
3.3750 g | Freq: Once | INTRAVENOUS | Status: AC
  Administered 2015-04-04: 3.375 g via INTRAVENOUS
  Filled 2015-04-04: qty 50

## 2015-04-04 MED ORDER — ROSUVASTATIN CALCIUM 20 MG PO TABS
20.0000 mg | ORAL_TABLET | Freq: Every day | ORAL | Status: DC
  Administered 2015-04-04 – 2015-04-06 (×3): 20 mg via ORAL
  Filled 2015-04-04 (×4): qty 1

## 2015-04-04 MED ORDER — ASPIRIN EC 81 MG PO TBEC
81.0000 mg | DELAYED_RELEASE_TABLET | Freq: Every evening | ORAL | Status: DC
  Administered 2015-04-04 – 2015-04-06 (×3): 81 mg via ORAL
  Filled 2015-04-04 (×4): qty 1

## 2015-04-04 MED ORDER — PROMETHAZINE HCL 25 MG PO TABS: 12.5000 mg | ORAL_TABLET | Freq: Four times a day (QID) | ORAL | Status: DC | PRN

## 2015-04-04 MED ORDER — OXYCODONE HCL 5 MG PO TABS
5.0000 mg | ORAL_TABLET | Freq: Four times a day (QID) | ORAL | Status: DC | PRN
  Administered 2015-04-04 – 2015-04-06 (×3): 5 mg via ORAL
  Filled 2015-04-04 (×3): qty 1

## 2015-04-04 MED ORDER — PIPERACILLIN-TAZOBACTAM 3.375 G IVPB
3.3750 g | Freq: Three times a day (TID) | INTRAVENOUS | Status: DC
  Administered 2015-04-04 – 2015-04-05 (×3): 3.375 g via INTRAVENOUS
  Filled 2015-04-04 (×4): qty 50

## 2015-04-04 MED ORDER — ALUM & MAG HYDROXIDE-SIMETH 200-200-20 MG/5ML PO SUSP: 30.0000 mL | Freq: Four times a day (QID) | ORAL | Status: DC | PRN

## 2015-04-04 MED ORDER — NITROGLYCERIN 0.4 MG SL SUBL: 0.4000 mg | SUBLINGUAL_TABLET | SUBLINGUAL | Status: DC | PRN

## 2015-04-04 NOTE — H&P (Addendum)
[facility] History and Physical  [name] AVW:[id number] DOB: 08-08-1941 DOA: 04/04/2015  Referring physician:  [facility] PCP:  Burton Apley, MD   Chief Complaint:  Leg pain and swelling, chest pain  HPI:  The patient is a 74 y.o. year-old female with history of chronic combined systolic and diastolic heart failure, hypothyroidism, anemia, GERD, breast cancer status post chemotherapy, radiation, pulmonary hypertension, GI bleed, coronary artery disease who presents with leg pain and swelling and chest pain.  She was admitted from 4/27 through 5/1due to sepsis from cellulitisand acute on chronic systolic and diastolic heart failure exacerbation.during that admission she was initially treated with vancomycin and cefepime and she was discharged with Augmentin.  Her course was complicated by elevated troponins which were felt to be due to demand ischemia secondary to sepsis and heart failure.  When she was discharged her torsemide was increased from 20 mg twice a day to 40 mg twice a day and spironolactone was added to her regimen.  Since discharge, she was told to hold her diuretics twice.  Most recently she saw Dr. Aundra Dubin on 5/18 who recommended that she increase her diabetics briefly for a couple of days and then resume her previous dosing. Her labs, however, demonstrated an elevated creatinine up to 3 and she was told to stop her torsemide and her spironolactone until she followed up with them in clinic this week. She denies any weight gain, or extremity swelling, shortness of breath. Her weight at the time of discharge was 139 pounds and currently is 145 pounds. Yesterday, she developed a high fever 202.9 Fahrenheit, lower extremity pain and swelling. She noticed some redness mostly on the right ankle and top of the right foot. There was also a little redness and warmth to the left foot. She has some mild nausea with vomiting, diarrhea, dysuria, increased cough. She presented to the  [facility] emergency department where she was found to have a fever of 101.5 Fahrenheit.  Her labs there were notable for a white blood cell count of 16.5, hemoglobin 11.2, creatinine 1.91, BNP 359. Blood cultures were obtained(. She was given a dose of vancomycin. She states she gets intermittent 6 out of 10 left chest tightness usually with exertion that is associated with some shortness of breath and occasionally some nausea. She denies any associated lightheadedness, dizziness diaphoresis. Episodes last for a couple of minutes and then resolve with rest. She had an episode of similar pain in the emergency department yesterday that resolved with nitroglycerin and morphine.  Troponin was negative.  ECG demonstrated NSR with RBBB with mild ST segment elevation in inferior leads and deep T waves in anterior leads, stable from prior.  Of note she was given of thousand liter bolus of IV fluids in the ER.   Review of Systems:  General:  positive fevers, chills, denies weight loss or gain HEENT:  Denies changes to hearing and vision, rhinorrhea, sinus congestion, sore throat CV:  Per history of present illness PULM:  Denies wheezing, cough.   GI:  Denies constipation, diarrhea.   GU:  Denies dysuria, frequency, urgency ENDO:  Denies polyuria, polydipsia.   HEME:  Denies hematemesis, blood in stools, melena, abnormal bruising or bleeding.  LYMPH:  Denies lymphadenopathy.   MSK:  6/10 pain in bilateral feet   DERM:  Denies skin ulcer.  Per HPI NEURO:  Denies focal numbness, weakness, slurred speech, confusion, facial droop that are new PSYCH:  Denies anxiety and depression.    Past Medical History  Diagnosis Date  . Pulmonary hypertension     s/p RHC 4/10 - PAP 86/29, wedge 21-23  . Gastric ulcer with hemorrhage   . Rheumatic fever     Childhood  . Coronary artery disease     Nonobstructive, LVEF 55%  . Mitral regurgitation     a. s/p bioprosthetic MVR  . Tricuspid regurgitation     a. s/p  TV repair  . Hypertension   . Stroke     a. 10/2010 with residual L sided weakness  . Asthma     PT. ON OXYGEN 2 L/[place]   . Diabetes mellitus   . Hypothyroidism   . Anemia, pernicious 1979  . Hepatitis     PT. STATES SHE HAD IT WHEN SHE WAS 14 YRS. OLD  . Arthritis   . GERD (gastroesophageal reflux disease)   . Fibromyalgia   . Squamous cell carcinoma     Left arm  . Breast cancer     2008 s/p chemo and radiation (last tx 4/09) s/p left mastectomy s/p 9 lymph node resection - [name], [place]  . Atrial fibrillation     a. s/p MAZE  . 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 AICD (automatic cardioverter/defibrillator) present    Past Surgical History  Procedure Laterality Date  . Partial gastrectomy    . Tonsillectomy    . Mastectomy  2008    LEFT  . Maze  09/24/2011    Procedure: MAZE (complete biatrial lesion set using RF and cryo with clipping of LA appendage);  Surgeon: Rexene Alberts, MD;  Location: [place];  Service: Open Heart Surgery;  Laterality: N/A;  . Mitral valve replacement  09/24/2011    Procedure: MITRAL VALVE (MV) REPLACEMENT (#54mm Medtronic Mosaic bioprosthetic tissue valve);  Surgeon: Rexene Alberts, MD;  Location: [place];  Service: Open Heart Surgery;  Laterality: N/A;  . Tricuspid valvuloplasty  09/24/2011    #25mm Edwards mc3 ring annuloplasty  . Left and right heart catheterization with coronary angiogram N/A 03/30/2013    Procedure: LEFT AND RIGHT HEART CATHETERIZATION WITH CORONARY ANGIOGRAM;  Surgeon: Peter M Martinique, MD;  Location: [facility] CATH LAB;  Service: Cardiovascular;  Laterality: N/A;  . Implantable cardioverter defibrillator implant N/A 03/31/2013    MDT Evalyn Casco DR implanted by Dr Lovena Le for secondary prevention   Social History:  reports that she has never smoked. She has never used smokeless tobacco. She reports that she does not drink alcohol or use illicit drugs.  Allergies  Allergen Reactions  .  Codeine Nausea And Vomiting  . Butorphanol Tartrate     REACTION: migraines, nervous/agitated  . Keflex [Cephalexin]     Listed as allergy at [facility], but tolerated cefepime here.  . Levofloxacin     Avoid QTc prolonging medications  . Lipitor [Atorvastatin Calcium] Diarrhea    nightmares  . Revatio [Sildenafil Citrate] Swelling  . Zofran [Ondansetron Hcl]     Avoid QTc prolonging medications    Family History  Problem Relation Age of Onset  . Heart disease Mother   . Colon cancer Mother      Prior to Admission medications   Medication Sig Start Date End Date Taking? Authorizing Provider  albuterol (PROVENTIL HFA;VENTOLIN HFA) 108 (90 BASE) MCG/ACT inhaler Inhale 1 puff into the lungs every 6 (six) hours as needed for wheezing or shortness of breath.    Historical Provider, MD  aspirin EC 81 MG tablet Take 81 mg by mouth  every evening.    Historical Provider, MD  cholecalciferol (VITAMIN D) 1000 UNITS tablet Take 1,000 Units by mouth daily.    Historical Provider, MD  cyanocobalamin (,VITAMIN B-12,) 1000 MCG/ML injection Inject 1,000 mcg into the muscle every 30 (thirty) days.    Historical Provider, MD  ferrous sulfate 325 (65 FE) MG tablet Take 325 mg by mouth daily with breakfast.    Historical Provider, MD  folic acid (FOLVITE) 1 MG tablet Take 2 mg by mouth daily.     Historical Provider, MD  levothyroxine (SYNTHROID, LEVOTHROID) 125 MCG tablet Take 125 mcg by mouth daily before breakfast.    Historical Provider, MD  lisinopril (PRINIVIL,ZESTRIL) 5 MG tablet Take 2.5 mg by mouth daily.    Historical Provider, MD  LORazepam (ATIVAN) 0.5 MG tablet Take 0.5-1 mg by mouth at bedtime as needed for sleep (muscle spasm).     Historical Provider, MD  magnesium oxide (MAG-OX) 400 MG tablet Take 1 tablet (400 mg total) by mouth daily. 03/28/15   Larey Dresser, MD  metoprolol tartrate (LOPRESSOR) 25 MG tablet Take 25 mg by mouth 2 (two) times daily.    Historical Provider, MD   mirtazapine (REMERON) 15 MG tablet Take 15 mg by mouth at bedtime.  06/19/13   Historical Provider, MD  nitroGLYCERIN (NITROSTAT) 0.4 MG SL tablet Place 0.4 mg under the tongue every 5 (five) minutes as needed for chest pain.    Historical Provider, MD  oxyCODONE-acetaminophen (PERCOCET) 10-325 MG per tablet Take 1 tablet by mouth every 6 (six) hours.     Historical Provider, MD  potassium chloride SA (K-DUR,KLOR-CON) 20 MEQ tablet Take 2 tablets (40 mEq total) by mouth 2 (two) times daily. 03/28/15   Larey Dresser, MD  simvastatin (ZOCOR) 10 MG tablet Take 10 mg by mouth at bedtime.    Historical Provider, MD  spironolactone (ALDACTONE) 25 MG tablet Take 1 tablet (25 mg total) by mouth daily. 03/28/15   Larey Dresser, MD  temazepam (RESTORIL) 30 MG capsule Take 30 mg by mouth at bedtime.     Historical Provider, MD  thyroid (ARMOUR) 120 MG tablet Take 120 mg by mouth daily before breakfast.    Historical Provider, MD  torsemide (DEMADEX) 20 MG tablet Take 2 tablets (40 mg total) by mouth 2 (two) times daily. 03/28/15   Larey Dresser, MD   Physical Exam: Filed Vitals:   04/04/15 2725 04/04/15 0653  BP: 101/53   Pulse: 70   Temp: 99.2 F (37.3 C)   TempSrc: Oral   Resp: 16   Height:  5\' 2"  (1.575 m)  Weight:  65.817 kg (145 lb 1.6 oz)  SpO2: 96%      General:  Thin female, no acute distress, lying in bed and able to sit up without assistance, and notable kyphosis  Eyes:  PERRL, anicteric, non-injected.  ENT:  Nares clear.  OP clear, non-erythematous without plaques or exudates.  MMM.  Neck:  Supple without TM or JVD.    Lymph:  No cervical, supraclavicular, or submandibular LAD.  Cardiovascular:  RRR, difficult to hear S1 or S2 and she has a low pitched rumbling heart murmur heard throughout the precordium loudest at the right sternal border.  2+ pulses, warm extremities  Respiratory:  CTA bilaterally without increased WOB.  Abdomen:  NABS.  Soft, ND/NT.    Skin:  Warmth  and a light pink erythema superimposed on dark red discoloration with discrete border on bilateral feet.  Musculoskeletal:  Normal bulk and tone.  1+ slow pitting bilateral lower extremity edema  Psychiatric:  A & O x 4.  Appropriate affect.  Neurologic:  CN 3-12 intact.  5/5 strength.  Sensation intact.  Labs on Admission:  Basic Metabolic Panel:  Recent Labs Lab 03/28/15 1600  NA 135  K 4.8  CL 99*  CO2 26  GLUCOSE 100*  BUN 40*  CREATININE 3.04*  CALCIUM 10.7*  MG 2.2   Liver Function Tests: No results for input(s): AST, ALT, ALKPHOS, BILITOT, PROT, ALBUMIN in the last 168 hours. No results for input(s): LIPASE, AMYLASE in the last 168 hours. No results for input(s): AMMONIA in the last 168 hours. CBC: No results for input(s): WBC, NEUTROABS, HGB, HCT, MCV, PLT in the last 168 hours. Cardiac Enzymes: No results for input(s): CKTOTAL, CKMB, CKMBINDEX, TROPONINI in the last 168 hours.  BNP (last 3 results)  Recent Labs  03/08/15 2333 03/28/15 1600  BNP 698.3* 220.7*    ProBNP (last 3 results) No results for input(s): PROBNP in the last 8760 hours.  CBG: No results for input(s): GLUCAP in the last 168 hours.  Radiological Exams on Admission: No results found.  EKG: Independently reviewed.  NSR with RBBB with mild ST segment elevation in inferior leads and deep T waves in anterior leads, stable from prior  Assessment/Plan Principal Problem:   Sepsis Active Problems:   CORONARY ATHEROSCLEROSIS NATIVE CORONARY ARTERY   Pulmonary hypertension   S/P MVR (mitral valve replacement)   Chronic combined systolic and diastolic heart failure   Chest pain   Cellulitis  ---  Sepsis (leukocytosis and fever) secondary to bilateral lower extremity cellulitis -  Continue vancomycin and add zosyn -  Please refer not to prescribe fluoroquinolones at discharge secondary to prolonged QTC -  Judicious use of IV fluids in the setting of pulmonary hypertension and chronic  systolic and diastolic heart failure -  Follow-up blood cultures from outside hospital  Chronic systolic and diastolic CHF and pulmonary hypertension s/p porcine MVR 2/2 rheumatic fever.  Pulmonary arterial pressures of 86/29 and wedge pressures of 21-23 on right heart cath from 4/10.  Not tolerant of revatio.  Recent ECHO with peak PA pressure of 63 mmHg.  ECHO from 03/09/2015 demonstrated an ejection fraction of 55-60% (previous ejection fraction of 26%) with diastolic dysfunction. She has a bioprosthetic mitral valve which was functioning normally.  Diuretics recently held secondary to acute kidney injury (cr 3).  Her creatinine is down to 1.9, however she has gained 6 pounds.   -  Daily weights and strict I/O -  Hold ACEI 2/2 AKI -  continue beta blocker -  Continue to hold torsemide and spironolactone -  2 gm sodium diet with 1.2L fluid restriction -  According to Dr. Claris Gladden last note, he was recommending a possible repeat right heart cath soon -  Heart failure team consulted  CAD/hypertension/hyperlipidemia with episode of chest pain at rest in ER -  Telemetry -  Cycle troponins and EKGs -  Continue aspirin, beta blocker -  Increase to high-dose statin, crestor (not tolerant of lipitor)  VF/T arrest in 2014 initially thought to be due to severe hypokalemia and prolonged QTc and paroxsymal atrial fibrillation status post maze, not on A/C due to anemia and recurrent GIB -  Status post AICD placement -  Avoid QTc prolonging medications  Acute on chronic kidney disease stage 3 likely due to diuretics.  Baseline creatinine is 1.3. Peak creatinine 3 on 5/17.   -  Strict I/O and daily weights -  Renally dose medications -  Minimize nephrotoxins and avoid ACEI/ARB  Diabetes mellitus type 2 in remission, not on home medications, diet controlled -  A1c  -  Daily CBG and start SSI if persistently elevated > 140  Pernicious anemia and iron deficiency anemia -  Continue vitamin B12 and iron  supplementation  Hypothyroidism with normal TSH about one month ago, continue thyroid supplementation  History of stroke with transient left-sided weakness and history of breast cancer s/p mastectomy and XRT now in remission  Diet:  Diabetic/healthy heart Access:  PIV IVF:  off Proph:  heparin  Code Status: Full Family Communication: patient alone Disposition Plan: Admit to telemetry  Time spent: 60 min Janece Canterbury [facility] Pager [phone]  If 7PM-7AM, please contact night-coverage [email or web address] Password [facility] 04/04/2015, 8:24 AM

## 2015-04-04 NOTE — Consult Note (Signed)
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  She is status post bioprosthetic mitral valve replacement, tricuspid valve repair, and Maze procedure on September 24, 2011 (pre-op cath minimal CAD). She has had secondary PAH but was not been able to tolerate Revatio in past.  08/12/12 RHC RA = 15 v= 21  RV = 64/3/13  PA = 59/16 (33)  PCW = 14  Fick cardiac output/index = 4.1/2.4  PVR = 7.8 Woods  FA sat = 98%  PA sat = 60%, 63%   Myoview 02/2014 -Low risk stress nuclear study with small, mild, fixed anterior and inferior defects consistent with soft tissue attenuation; no ischemia. LV Ejection Fraction: 61%. LV Wall Motion: NL LV Function; NL Wall Motion  Echo (4/16) with EF 55-60%, normal bioprosthetic mitral valve, no aortic stenosis, moderately dilated RV with moderately decreased systolic function, moderate TR, PA systolic pressure 63 mmHg.  Admitted earlier this month with LE cellulitis/sepsis. Treated with abx. While in hospital had recurrent VT/VF due to hypokalemia with ICD firing. When she was discharged her torsemide was increased from 20 mg twice a day to 40 mg twice a day and spironolactone was added to her regimen. Since discharge, she was told to hold her diuretics twice. Most recently she saw Dr. Aundra Dubin on 5/18 who recommended that she increase her toresmide briefly for a couple of days and then resume her previous dosing. Her labs, however, demonstrated an elevated creatinine up to 3 and she was told to stop her torsemide and her spironolactone for a few days. Her weight at the time of discharge was 139 pounds and currently is 145 pounds. Yesterday, she developed a high fever  102.9 , lower extremity pain and swelling. She noticed some redness mostly on the right ankle and top of the right foot. There was also a little redness and warmth to the left foot. She has some mild nausea with vomiting, diarrhea, dysuria, increased cough. She presented to the [facility] emergency department where she was found to have a fever of 101.5 Fahrenheit. Her labs there were notable for a white blood cell count of 16.5, hemoglobin 11.2, creatinine 1.91, BNP 359. Blood cultures were obtained. She was given a dose of vancomycin.  Currently feels ok. Has mild chronic dyspnea. No orthopnea or PND.  Review of Systems: [y] = yes, [ ]  = no   General: Weight gain [ ] ; Weight loss [ ] ; Anorexia [ ] ; Fatigue [ y]; Fever Blue.Reese ]; Chills [ ] ; Weakness [ ]   Cardiac: Chest pain/pressure [ ] ; Resting SOB [ ] ; Exertional SOB Blue.Reese ]; Orthopnea [ ] ; Pedal Edema Blue.Reese ]; Palpitations [ ] ; Syncope [ ] ; Presyncope [ ] ; Paroxysmal nocturnal dyspnea[ ]   Pulmonary: Cough [ ] ; Wheezing[ ] ; Hemoptysis[ ] ; Sputum [ ] ; Snoring [ ]   GI: Vomiting[ ] ; Dysphagia[ ] ; Melena[ ] ; Hematochezia [ ] ; Heartburn[ ] ; Abdominal pain [ ] ; Constipation [ ] ; Diarrhea [ ] ; BRBPR [ ]   GU: Hematuria[ ] ; Dysuria [ ] ; Nocturia[ ]   Vascular: Pain in legs with walking Blue.Reese ]; Pain in feet with lying flat Blue.Reese ]; Non-healing sores [ ] ; Stroke [ ] ; TIA [ ] ; Slurred speech [ ] ;  Neuro: Headaches[ ] ; Vertigo[ ] ; Seizures[ ] ; Paresthesias[ ] ;Blurred vision [ ] ; Diplopia [ ] ; Vision changes [ ]   Ortho/Skin: Arthritis Blue.Reese ]; Joint pain Blue.Reese ]; Muscle pain [ ] ; Joint swelling [ ] ; Back Pain Blue.Reese ]; Rash Blue.Reese ]  Psych: Depression[ ] ; Anxiety[ ]   Heme: Bleeding problems [ ] ; Clotting disorders [ ] ; Anemia [ ]   Endocrine: Diabetes Blue.Reese ]; Thyroid dysfunction[ ]   Home Medications Prior to Admission medications   Medication Sig Start Date End Date Taking? Authorizing Provider  albuterol (PROVENTIL HFA;VENTOLIN HFA) 108 (90 BASE) MCG/ACT inhaler Inhale 1 puff into the  lungs every 6 (six) hours as needed for wheezing or shortness of breath.    Historical Provider, MD  aspirin EC 81 MG tablet Take 81 mg by mouth every evening.    Historical Provider, MD  cholecalciferol (VITAMIN D) 1000 UNITS tablet Take 1,000 Units by mouth daily.    Historical Provider, MD  cyanocobalamin (,VITAMIN B-12,) 1000 MCG/ML injection Inject 1,000 mcg into the muscle every 30 (thirty) days.    Historical Provider, MD  ferrous sulfate 325 (65 FE) MG tablet Take 325 mg by mouth daily with breakfast.    Historical Provider, MD  folic acid (FOLVITE) 1 MG tablet Take 2 mg by mouth daily.     Historical Provider, MD  levothyroxine (SYNTHROID, LEVOTHROID) 125 MCG tablet Take 125 mcg by mouth daily before breakfast.    Historical Provider, MD  lisinopril (PRINIVIL,ZESTRIL) 5 MG tablet Take 2.5 mg by mouth daily.    Historical Provider, MD  LORazepam (ATIVAN) 0.5 MG tablet Take 0.5-1 mg by mouth at bedtime as needed for sleep (muscle spasm).     Historical Provider, MD  magnesium oxide (MAG-OX) 400 MG tablet Take 1 tablet (400 mg total) by mouth daily. 03/28/15   Larey Dresser, MD  metoprolol tartrate (LOPRESSOR) 25 MG tablet Take 25 mg by mouth 2 (two) times daily.    Historical Provider, MD  mirtazapine (REMERON) 15 MG tablet Take 15 mg by mouth at bedtime.  06/19/13   Historical Provider, MD  nitroGLYCERIN (NITROSTAT) 0.4 MG SL tablet Place 0.4 mg under the tongue every 5 (five) minutes as needed for chest pain.    Historical Provider, MD  oxyCODONE-acetaminophen (PERCOCET) 10-325 MG per tablet Take 1 tablet by mouth every 6 (six) hours.     Historical Provider, MD  potassium chloride SA (K-DUR,KLOR-CON) 20 MEQ tablet Take 2 tablets (40 mEq total) by mouth 2 (two) times daily. 03/28/15   Larey Dresser, MD  simvastatin (ZOCOR) 10 MG tablet Take 10 mg by mouth at bedtime.    Historical Provider, MD  spironolactone (ALDACTONE) 25 MG tablet Take 1 tablet (25 mg total) by mouth daily. 03/28/15    Larey Dresser, MD  temazepam (RESTORIL) 30 MG capsule Take 30 mg by mouth at bedtime.     Historical Provider, MD  torsemide (DEMADEX) 20 MG tablet Take 2 tablets (40 mg total) by mouth 2 (two) times daily. 03/28/15   Larey Dresser, MD    Past Medical History: Past Medical History  Diagnosis Date  . Pulmonary hypertension     s/p RHC 4/10 - PAP 86/29, wedge 21-23  . Gastric ulcer with hemorrhage   . Rheumatic fever     Childhood  . Coronary artery disease     Nonobstructive, LVEF 55%  . Mitral regurgitation     a. s/p bioprosthetic MVR  . Tricuspid regurgitation     a. s/p TV  repair  . Hypertension   . Stroke     a. 10/2010 with residual L sided weakness  . Asthma     PT. ON OXYGEN 2 L/   . Diabetes mellitus   . Hypothyroidism   . Anemia, pernicious 1979  . Hepatitis     PT. STATES SHE HAD IT WHEN SHE WAS 14 YRS. OLD  . Arthritis   . GERD (gastroesophageal reflux disease)   . Fibromyalgia   . Squamous cell carcinoma     Left arm  . Breast cancer     2008 s/p chemo and radiation (last tx 4/09) s/p left mastectomy s/p 9 lymph node resection - [name], [place]  . Atrial fibrillation     a. s/p MAZE  . 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 AICD (automatic cardioverter/defibrillator) present     Past Surgical History: Past Surgical History  Procedure Laterality Date  . Partial gastrectomy    . Tonsillectomy    . Mastectomy  2008    LEFT  . Maze  09/24/2011    Procedure: MAZE (complete biatrial lesion set using RF and cryo with clipping of LA appendage);  Surgeon: Rexene Alberts, MD;  Location: [facility];  Service: Open Heart Surgery;  Laterality: N/A;  . Mitral valve replacement  09/24/2011    Procedure: MITRAL VALVE (MV) REPLACEMENT (#66mm Medtronic Mosaic bioprosthetic tissue valve);  Surgeon: Rexene Alberts, MD;  Location: [facility];  Service: Open Heart Surgery;  Laterality: N/A;  . Tricuspid valvuloplasty  09/24/2011     #53mm Edwards mc3 ring annuloplasty  . Left and right heart catheterization with coronary angiogram N/A 03/30/2013    Procedure: LEFT AND RIGHT HEART CATHETERIZATION WITH CORONARY ANGIOGRAM;  Surgeon: Peter M Martinique, MD;  Location: [facility] CATH LAB;  Service: Cardiovascular;  Laterality: N/A;  . Implantable cardioverter defibrillator implant N/A 03/31/2013    MDT Evalyn Casco DR implanted by Dr Lovena Le for secondary prevention    Family History: Family History  Problem Relation Age of Onset  . Heart disease Mother   . Colon cancer Mother     Social History: History   Social History  . Marital Status: Married    Spouse Name: N/A  . Number of Children: 2  . Years of Education: N/A   Occupational History  . Unemployed     High school education   Social History Main Topics  . Smoking status: Never Smoker   . Smokeless tobacco: Never Used  . Alcohol Use: No  . Drug Use: No  . Sexual Activity: Not on file   Other Topics Concern  . Not on file   Social History Narrative    Allergies:  Allergies  Allergen Reactions  . Codeine Nausea And Vomiting  . Butorphanol Tartrate     REACTION: migraines, nervous/agitated  . Keflex [Cephalexin]     Listed as allergy at [facility], but tolerated cefepime here.  . Levofloxacin     Avoid QTc prolonging medications  . Lipitor [Atorvastatin Calcium] Diarrhea    nightmares  . Revatio [Sildenafil Citrate] Swelling  . Zofran [Ondansetron Hcl]     Avoid QTc prolonging medications    Objective:    Vital Signs:   Temp:  [99.2 F (37.3 C)] 99.2 F (37.3 C) (05/24 0643) Pulse Rate:  [70] 70 (05/24 0643) Resp:  [16] 16 (05/24 0643) BP: (101)/(53) 101/53 mmHg (05/24 0643) SpO2:  [96 %] 96 % (05/24 0643) Weight:  [65.817 kg (145  lb 1.6 oz)] 65.817 kg (145 lb 1.6 oz) (05/24 0653) Last BM Date: 04/03/15  Weight change: Filed Weights   04/04/15 0653  Weight: 65.817 kg (145 lb 1.6 oz)    Intake/Output:  No intake or output data in the 24  hours ending 04/04/15 0848   Physical Exam: General: Chronically ill appearing. No acute distress.No resp difficulty.  HEENT: normal Neck: supple. JVP to ear with prominent +CV waves. Carotids 2+ bilaterally; no bruits. No lymphadenopathy or thryomegaly appreciated. Cor: PMI normal. Regular rate & rhythm. No rubs, gallops. 3/6 HSM LLSB.  Lungs: clear Abdomen: soft,  nontender, mild-moderately distended.No bruits or masses. Good bowel sounds. Extremities:. No clubbing. tr edema  Cellulitic rash both LEs Neuro: alert & orientedx3, cranial nerves grossly intact. Moves all 4 extremities w/o difficulty. Affect pleasant.  Telemetry: SR  Labs: Basic Metabolic Panel:  Recent Labs Lab 03/28/15 1600  NA 135  K 4.8  CL 99*  CO2 26  GLUCOSE 100*  BUN 40*  CREATININE 3.04*  CALCIUM 10.7*  MG 2.2    Liver Function Tests: No results for input(s): AST, ALT, ALKPHOS, BILITOT, PROT, ALBUMIN in the last 168 hours. No results for input(s): LIPASE, AMYLASE in the last 168 hours. No results for input(s): AMMONIA in the last 168 hours.  CBC: No results for input(s): WBC, NEUTROABS, HGB, HCT, MCV, PLT in the last 168 hours.  Cardiac Enzymes: No results for input(s): CKTOTAL, CKMB, CKMBINDEX, TROPONINI in the last 168 hours.  BNP: BNP (last 3 results)  Recent Labs  03/08/15 2333 03/28/15 1600  BNP 698.3* 220.7*    ProBNP (last 3 results) No results for input(s): PROBNP in the last 8760 hours.   CBG: No results for input(s): GLUCAP in the last 168 hours.  Coagulation Studies: No results for input(s): LABPROT, INR in the last 72 hours.  Other results: EKG: pending  Imaging:  No results found.   Medications:     Current Medications: . aspirin EC  81 mg Oral QPM  . [START ON 04/05/2015] ferrous sulfate  325 mg Oral Q breakfast  . folic acid  2 mg Oral Daily  . heparin  5,000 Units Subcutaneous 3 times per day  . [START ON 04/05/2015] levothyroxine  125 mcg Oral  QAC breakfast  . metoprolol tartrate  25 mg Oral BID  . mirtazapine  15 mg Oral QHS  . piperacillin-tazobactam  3.375 g Intravenous Once  . rosuvastatin  20 mg Oral q1800  . saccharomyces boulardii  250 mg Oral BID  . sodium chloride  3 mL Intravenous Q12H  . temazepam  30 mg Oral QHS  . vancomycin  1,250 mg Intravenous Once     Infusions:      Assessment:   1. Bilateral LE cellulitis with sepsis 2. Chronic diastolic heart failure 3. A/C real failure, stage 4 - improving 4. H/ot VT/VF with QT prolongation and hypokalemia 5. PAF - no coumadin due to falls 6. Rheumatic heart disease with secondary PAH   Plan/Discussion:     Main issue is recurrent cellulitis and possible sepsis. Volume status does not look too bad. Agree with holding abx at least one more day then can restart gently. Be very careful with electrolytes given VT/VF. Not candidate for [facility] with falls.   No role for RHC at this point. Can reassess at later point.   We Will follow.   D/W Dr. Sheran Fava.   Length of Stay: 0  Glori Bickers MD 04/04/2015, 8:48 AM  Advanced  Heart Failure Team Pager [phone] (M-F; [place])  Please contact [facility] for night-coverage after hours (4p -7a ) and weekends on [email or web address]

## 2015-04-04 NOTE — Progress Notes (Addendum)
ANTIBIOTIC CONSULT NOTE - INITIAL  Pharmacy Consult for vancomycin and zosyn Indication: rule out sepsis, cellulitis  Allergies  Allergen Reactions  . Codeine Nausea And Vomiting  . Butorphanol Tartrate     REACTION: migraines, nervous/agitated  . Keflex [Cephalexin]     Listed as allergy at [facility], but tolerated cefepime here.  . Levofloxacin     Avoid QTc prolonging medications  . Lipitor [Atorvastatin Calcium] Diarrhea    nightmares  . Revatio [Sildenafil Citrate] Swelling  . Zofran [Ondansetron Hcl]     Avoid QTc prolonging medications    Patient Measurements: Height: 5\' 2"  (157.5 cm) Weight: 145 lb 1.6 oz (65.817 kg) (a scale) IBW/kg (Calculated) : 50.1   Vital Signs: Temp: 99.2 F (37.3 C) (05/24 0643) Temp Source: Oral (05/24 0643) BP: 101/53 mmHg (05/24 0643) Pulse Rate: 70 (05/24 0643) Intake/Output from previous day:   Intake/Output from this shift:    Labs: No results for input(s): WBC, HGB, PLT, LABCREA, CREATININE in the last 72 hours. Estimated Creatinine Clearance: 15.1 mL/min (by C-G formula based on Cr of 3.04). No results for input(s): VANCOTROUGH, VANCOPEAK, VANCORANDOM, GENTTROUGH, GENTPEAK, GENTRANDOM, TOBRATROUGH, TOBRAPEAK, TOBRARND, AMIKACINPEAK, AMIKACINTROU, AMIKACIN in the last 72 hours.   Microbiology: Recent Results (from the past 720 hour(s))  MRSA PCR Screening     Status: None   Collection Time: 03/08/15  8:37 PM  Result Value Ref Range Status   MRSA by PCR NEGATIVE NEGATIVE Final    Comment:        The GeneXpert MRSA Assay (FDA approved for NASAL specimens only), is one component of a comprehensive MRSA colonization surveillance program. It is not intended to diagnose MRSA infection nor to guide or monitor treatment for MRSA infections.   Culture, blood (routine x 2)     Status: None   Collection Time: 03/08/15 11:20 PM  Result Value Ref Range Status   Specimen Description BLOOD RIGHT ANTECUBITAL  Final   Special  Requests   Final    BOTTLES DRAWN AEROBIC AND ANAEROBIC 5CC AER 1CC ANA   Culture   Final    NO GROWTH 5 DAYS Performed at [facility]    Report Status 03/15/2015 FINAL  Final  Culture, blood (routine x 2)     Status: None   Collection Time: 03/08/15 11:33 PM  Result Value Ref Range Status   Specimen Description BLOOD BLOOD RIGHT FOREARM  Final   Special Requests BOTTLES DRAWN AEROBIC AND ANAEROBIC 3CC  Final   Culture   Final    NO GROWTH 5 DAYS Performed at [facility]    Report Status 03/15/2015 FINAL  Final    Medical History: Past Medical History  Diagnosis Date  . Pulmonary hypertension     s/p RHC 4/10 - PAP 86/29, wedge 21-23  . Gastric ulcer with hemorrhage   . Rheumatic fever     Childhood  . Coronary artery disease     Nonobstructive, LVEF 55%  . Mitral regurgitation     a. s/p bioprosthetic MVR  . Tricuspid regurgitation     a. s/p TV repair  . Hypertension   . Stroke     a. 10/2010 with residual L sided weakness  . Asthma     PT. ON OXYGEN 2 L/[place]   . Diabetes mellitus   . Hypothyroidism   . Anemia, pernicious 1979  . Hepatitis     PT. STATES SHE HAD IT WHEN SHE WAS 14 YRS. OLD  . Arthritis   .  GERD (gastroesophageal reflux disease)   . Fibromyalgia   . Squamous cell carcinoma     Left arm  . Breast cancer     2008 s/p chemo and radiation (last tx 4/09) s/p left mastectomy s/p 9 lymph node resection - [name], [place]  . Atrial fibrillation     a. s/p MAZE  . 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 AICD (automatic cardioverter/defibrillator) present     Medications:  Prescriptions prior to admission  Medication Sig Dispense Refill Last Dose  . albuterol (PROVENTIL HFA;VENTOLIN HFA) 108 (90 BASE) MCG/ACT inhaler Inhale 1 puff into the lungs every 6 (six) hours as needed for wheezing or shortness of breath.   Taking  . aspirin EC 81 MG tablet Take 81 mg by mouth every evening.   Taking   . cholecalciferol (VITAMIN D) 1000 UNITS tablet Take 1,000 Units by mouth daily.   Taking  . cyanocobalamin (,VITAMIN B-12,) 1000 MCG/ML injection Inject 1,000 mcg into the muscle every 30 (thirty) days.   Taking  . ferrous sulfate 325 (65 FE) MG tablet Take 325 mg by mouth daily with breakfast.   Taking  . folic acid (FOLVITE) 1 MG tablet Take 2 mg by mouth daily.    Taking  . levothyroxine (SYNTHROID, LEVOTHROID) 125 MCG tablet Take 125 mcg by mouth daily before breakfast.   Taking  . lisinopril (PRINIVIL,ZESTRIL) 5 MG tablet Take 2.5 mg by mouth daily.   Taking  . LORazepam (ATIVAN) 0.5 MG tablet Take 0.5-1 mg by mouth at bedtime as needed for sleep (muscle spasm).    Taking  . magnesium oxide (MAG-OX) 400 MG tablet Take 1 tablet (400 mg total) by mouth daily. 30 tablet 3   . metoprolol tartrate (LOPRESSOR) 25 MG tablet Take 25 mg by mouth 2 (two) times daily.   Taking  . mirtazapine (REMERON) 15 MG tablet Take 15 mg by mouth at bedtime.    Taking  . nitroGLYCERIN (NITROSTAT) 0.4 MG SL tablet Place 0.4 mg under the tongue every 5 (five) minutes as needed for chest pain.   Taking  . oxyCODONE-acetaminophen (PERCOCET) 10-325 MG per tablet Take 1 tablet by mouth every 6 (six) hours.    Taking  . potassium chloride SA (K-DUR,KLOR-CON) 20 MEQ tablet Take 2 tablets (40 mEq total) by mouth 2 (two) times daily. 120 tablet 3   . simvastatin (ZOCOR) 10 MG tablet Take 10 mg by mouth at bedtime.   Taking  . spironolactone (ALDACTONE) 25 MG tablet Take 1 tablet (25 mg total) by mouth daily. 30 tablet 6   . temazepam (RESTORIL) 30 MG capsule Take 30 mg by mouth at bedtime.    Taking  . torsemide (DEMADEX) 20 MG tablet Take 2 tablets (40 mg total) by mouth 2 (two) times daily. 120 tablet 3    Assessment: 74 yo lady to start broad spectrum antibiotics for sepsis, cellulitis.  Her CrCl ~ 25 ml/min. She received 1gm of vanc at [facility] and ~ 6 hours later 1250 mg of vanc here. Will need to assess clearance  of vanc before giving another dose.  Goal of Therapy:  Vancomycin trough level 15-20 mcg/ml  Plan:  Zosyn 3.375 gm IV q8 hours Random vanc level tomorrow. Monitor renal function, cultures and clinical course  Thanks for allowing pharmacy to be a part of this patient's care.  Excell Seltzer, PharmD Clinical Pharmacist, [phone]  04/04/2015,8:28 AM

## 2015-04-05 DIAGNOSIS — I2510 Atherosclerotic heart disease of native coronary artery without angina pectoris: Secondary | ICD-10-CM

## 2015-04-05 DIAGNOSIS — Z954 Presence of other heart-valve replacement: Secondary | ICD-10-CM

## 2015-04-05 LAB — BASIC METABOLIC PANEL
ANION GAP: 10 (ref 5–15)
BUN: 24 mg/dL — ABNORMAL HIGH (ref 6–20)
CHLORIDE: 106 mmol/L (ref 101–111)
CO2: 22 mmol/L (ref 22–32)
Calcium: 9.4 mg/dL (ref 8.9–10.3)
Creatinine, Ser: 1.79 mg/dL — ABNORMAL HIGH (ref 0.44–1.00)
GFR, EST AFRICAN AMERICAN: 32 mL/min — AB (ref >60)
GFR, EST NON AFRICAN AMERICAN: 27 mL/min — AB (ref >60)
Glucose, Bld: 118 mg/dL — ABNORMAL HIGH (ref 65–99)
Potassium: 3.8 mmol/L (ref 3.5–5.1)
Sodium: 138 mmol/L (ref 135–145)

## 2015-04-05 LAB — CBC
HCT: 30.6 % — ABNORMAL LOW (ref 36.0–46.0)
HEMOGLOBIN: 9.5 g/dL — AB (ref 12.0–15.0)
MCH: 28.5 pg (ref 26.0–34.0)
MCHC: 31 g/dL (ref 30.0–36.0)
MCV: 91.9 fL (ref 78.0–100.0)
PLATELETS: 137 10*3/uL — AB (ref 150–400)
RBC: 3.33 MIL/uL — ABNORMAL LOW (ref 3.87–5.11)
RDW: 17 % — AB (ref 11.5–15.5)
WBC: 8.8 10*3/uL (ref 4.0–10.5)

## 2015-04-05 LAB — GLUCOSE, CAPILLARY
GLUCOSE-CAPILLARY: 100 mg/dL — AB (ref 65–99)
GLUCOSE-CAPILLARY: 113 mg/dL — AB (ref 65–99)
Glucose-Capillary: 89 mg/dL (ref 65–99)

## 2015-04-05 LAB — VANCOMYCIN, RANDOM: Vancomycin Rm: 23 ug/mL

## 2015-04-05 LAB — HEMOGLOBIN A1C
Hgb A1c MFr Bld: 6.8 % — ABNORMAL HIGH (ref 4.8–5.6)
MEAN PLASMA GLUCOSE: 148 mg/dL

## 2015-04-05 MED ORDER — AMOXICILLIN-POT CLAVULANATE ER 1000-62.5 MG PO TB12
2.0000 | ORAL_TABLET | Freq: Two times a day (BID) | ORAL | Status: DC
  Administered 2015-04-05 – 2015-04-06 (×2): 2 via ORAL
  Filled 2015-04-05 (×3): qty 2

## 2015-04-05 NOTE — Care Management Note (Signed)
Case Management Note  Patient Details  Name: [name] MRN: [id number] Date of Birth: [birth year]  Subjective/Objective:      Admitted with sepsis            Action/Plan: CM following for DCP; possibly need [place] services at discharge  Expected Discharge Date:     04/10/2015             Expected Discharge Plan:   home  Status of Service:   In progress   Sherrilyn Rist [phone] 04/05/2015, 10:00 AM

## 2015-04-05 NOTE — Progress Notes (Signed)
Advanced Heart Failure Rounding Note  PCP: Burton Apley, MD Primary Cardiologist: Domenic Polite, Clarence Dunsmore  Pt profile: [name] is a 74 yo female with h/o DM2, HTN, L breast CA s/p mastectomy/chemo/XRT 8/08, PUD s/p partial gastrectomy, rheumatic fever with MS/MR/TR , CVA with transient R sided weakness 12/11, CKD, recurrent VT/VF s/p ICD and PAF s/p MAZE. (not on coumadin due to falls).   Subjective:    Feeling ok breathing wise.  Lying flat without difficulty.  Denies CP or PND.   Objective:    Vital Signs:   Temp:  [97.5 F (36.4 C)-98.7 F (37.1 C)] 97.5 F (36.4 C) (05/25 0513) Pulse Rate:  [68-70] 70 (05/25 0513) Resp:  [16-18] 18 (05/25 0513) BP: (94-108)/(46-62) 94/62 mmHg (05/25 0513) SpO2:  [96 %-98 %] 96 % (05/25 0513) Weight:  [144 lb 12.8 oz (65.681 kg)] 144 lb 12.8 oz (65.681 kg) (05/25 0513) Last BM Date: 04/03/15  Weight change: Filed Weights   04/04/15 0653 04/05/15 0513  Weight: 145 lb 1.6 oz (65.817 kg) 144 lb 12.8 oz (65.681 kg)    Intake/Output:   Intake/Output Summary (Last 24 hours) at 04/05/15 0756 Last data filed at 04/05/15 0526  Gross per 24 hour  Intake   1334 ml  Output    850 ml  Net    484 ml     Physical Exam: General: Pleasant, NAD, lying flat. Chronically ill appearing. Neuro: Alert and oriented X 3. Moves all extremities spontaneously. Psych: Normal affect. HEENT: Normal Neck: Supple. JVP prominent with + CV waves. Carotids 2+. No bruits. No lymphadenopathy or thyromegaly appreciated Lungs: Resp regular and unlabored, scattered rales Heart: PMI normal. RRR. No rubs, gallops. 3/6 HSM heard best at LLSB Abdomen: Soft, non-tender, non-distended, BS + x 4.  Extremities: No clubbing or cyanosis. DP/PT/Radials 2+ and equal bilaterally. Trace edema. Cellulitic rash bilateral LE.   Telemetry: Atrial pacing @ 70 bpm  Labs: CBC  Recent Labs  04/04/15 0925 04/05/15 0459  WBC 15.0* 8.8  NEUTROABS 12.6*  --   HGB 9.8*  9.5*  HCT 30.8* 30.6*  MCV 90.6 91.9  PLT 152 323*   Basic Metabolic Panel  Recent Labs  04/04/15 0925 04/05/15 0459  NA 132* 138  K 4.2 3.8  CL 102 106  CO2 20* 22  GLUCOSE 139* 118*  BUN 21* 24*  CALCIUM 9.3 9.4   Liver Function Tests  Recent Labs  04/04/15 0925  AST 21  ALT 10*  ALKPHOS 73  BILITOT 1.0  PROT 7.0  ALBUMIN 3.0*    Cardiac Enzymes  Recent Labs  04/04/15 0925 04/04/15 1443 04/04/15 2024  TROPONINI 0.06* 0.03 <0.03    BNP: BNP (last 3 results)  Recent Labs  03/08/15 2333 03/28/15 1600  BNP 698.3* 220.7*   Hemoglobin A1C  Recent Labs  04/04/15 0925  HGBA1C 6.8*    Other results:     Imaging/Studies:  Dg Chest Port 1 View  04/04/2015   CLINICAL DATA:  Shortness of breath for 1 day.  Sepsis.  EXAM: PORTABLE CHEST - 1 VIEW  COMPARISON:  March 09, 2015  FINDINGS: There is stable scarring in the left apex region. Patchy atelectasis in the right mid and lower lung zones remains. There is no new opacity. There is no airspace consolidation. Heart is mildly enlarged with pulmonary vascularity within normal limits. Pacemaker leads are attached to the right atrium and right ventricle. A left atrial clamp is present. Patient is status post median sternotomy. No adenopathy.  IMPRESSION: No edema or consolidation. Atelectatic change on the right. Scarring left apex. No new opacity. Stable cardiac prominence.   Electronically Signed   By: Lowella Grip III M.D.   On: 04/04/2015 08:59     Latest Echo  Echo (4/16) with EF 55-60%, normal bioprosthetic mitral valve, no aortic stenosis, moderately dilated RV with moderately decreased systolic function, moderate TR, PA systolic pressure 63 mmHg.  Latest Cath/Work up  Myoview 02/2014 -Low risk stress nuclear study with small, mild, fixed anterior and inferior defects consistent with soft tissue attenuation; no ischemia. LV Ejection Fraction: 61%. LV Wall Motion: NL LV Function; NL Wall Motion    08/12/12 RHC RA = 15 v= 21  RV = 64/3/13  PA = 59/16 (33)  PCW = 14  Fick cardiac output/index = 4.1/2.4  PVR = 7.8 Woods  FA sat = 98%  PA sat = 60%, 63%   Medications:     Scheduled Medications: . aspirin EC  81 mg Oral QPM  . ferrous sulfate  325 mg Oral Q breakfast  . folic acid  2 mg Oral Daily  . heparin  5,000 Units Subcutaneous 3 times per day  . levothyroxine  125 mcg Oral QAC breakfast  . metoprolol tartrate  25 mg Oral BID  . mirtazapine  15 mg Oral QHS  . piperacillin-tazobactam (ZOSYN)  IV  3.375 g Intravenous Q8H  . rosuvastatin  20 mg Oral q1800  . saccharomyces boulardii  250 mg Oral BID  . sodium chloride  3 mL Intravenous Q12H  . temazepam  30 mg Oral QHS     Infusions:     PRN Medications:  acetaminophen **OR** acetaminophen, albuterol, alum & mag hydroxide-simeth, LORazepam, nitroGLYCERIN, oxyCODONE-acetaminophen **AND** oxyCODONE, polyethylene glycol, promethazine   Assessment/Plan   1. Bilateral LE cellulitis with sepsis - per primary team 2. Chronic diastolic heart failure - weight stable but still slightly up compared to last discharge 3. A/C real failure, stage 4 - improving, still up from baseline. 4. H/o VT/VF with QT prolongation and hypokalemia. Keep K> 4.0  5. PAF - no coumadin due to falls 6. Rheumatic heart disease with secondary PAH  Main issue is recurrent cellulitis and possible sepsis, on ABX per primary team Volume status only mildly elevated currently. Discussed holding of diuresis and starting back gently today vs tomorrow.  Be very careful with electrolytes given VT/VF. Not candidate for [facility] with falls, CHADs of at least 7.   No role for RHC at this point, reassess at later point per DB.  We will continue to follow.  Length of Stay: 1  [name], [place] 04/05/2015, 7:56 AM  Advanced Heart Failure Team Pager [phone] (M-F; 7a - 4p)  Please contact [facility] for night-coverage after hours (4p -7a ) and  weekends on [email or web address]   Patient seen and examined with [name] Kilts, PA-C. We discussed all aspects of the encounter. I agree with the assessment and plan as stated above.   Improving. Volume status stable. No need for diuretics yet. Would dupp K aggressively to keep > 4.0. We will give kcl 40 today.   Appreciate [facility] care,   [name] Yandell,MD 3:36 PM

## 2015-04-05 NOTE — Progress Notes (Addendum)
[name] KGM:[id number] DOB: 07-07-1941 DOA: 04/04/2015 PCP: Burton Apley, MD  Brief narrative: 74 y/o ? recent admit 4.27-->03/12/15 Sepsis 2/2 ? Pneumonia/lower extremity cellulitis + ICD firing in setting of use of azithromycin/ recent Tdp/hypokalemia [no driving X6 months]+ , rheumatic heart disease + MVR/TVR  [porcine] repairs,A. fib + Maze procedure, PAH with poor tolerance to revatio, PUD s/p Partial gastrectomy nonobstructive CAD based on cath 03/30/13, poor compliance with medical therapy represented to the hospital 04/04/15 high fever lower extremity pain and swelling, white count 16, 6/10 chest pain  Past medical history-As per Problem list Chart reviewed as below- reviewed  Consultants:  Cardiology  Procedures:  none  Antibiotics:  Vanc + Zosyn 5/24-->5/25  Augmentin 04/05/15---   Subjective  Well no cp/n/v/sob Has underlying dyspnea on exertion No diarr pleasant   Objective    Interim History:   Telemetry:    Objective: Filed Vitals:   04/04/15 2043 04/05/15 0137 04/05/15 0513 04/05/15 1057  BP: 94/50 108/59 94/62 100/57  Pulse: 70 70 70 68  Temp: 98.7 F (37.1 C) 97.6 F (36.4 C) 97.5 F (36.4 C)   TempSrc: Oral Oral Oral   Resp: 18 18 18    Height:      Weight:   65.681 kg (144 lb 12.8 oz)   SpO2: 96% 98% 96%     Intake/Output Summary (Last 24 hours) at 04/05/15 1222 Last data filed at 04/05/15 1057  Gross per 24 hour  Intake   1292 ml  Output    700 ml  Net    592 ml    Exam:  General: eomi, ncat Cardiovascular: s1 s 2no m/r/g no JVD,midline chest scar, PPM has no pain to the pocket Respiratory: clear no added sound Abdomen: soft, NT, Nd Skin    Neurointact  Data Reviewed: Basic Metabolic Panel:  Recent Labs Lab 04/04/15 0925 04/05/15 0459  NA 132* 138  K 4.2 3.8  CL 102 106  CO2 20* 22  GLUCOSE 139* 118*  BUN 21* 24*  CREATININE 1.81* 1.79*  CALCIUM 9.3 9.4   Liver Function Tests:  Recent Labs Lab  04/04/15 0925  AST 21  ALT 10*  ALKPHOS 73  BILITOT 1.0  PROT 7.0  ALBUMIN 3.0*   No results for input(s): LIPASE, AMYLASE in the last 168 hours. No results for input(s): AMMONIA in the last 168 hours. CBC:  Recent Labs Lab 04/04/15 0925 04/05/15 0459  WBC 15.0* 8.8  NEUTROABS 12.6*  --   HGB 9.8* 9.5*  HCT 30.8* 30.6*  MCV 90.6 91.9  PLT 152 137*   Cardiac Enzymes:  Recent Labs Lab 04/04/15 0925 04/04/15 1443 04/04/15 2024  TROPONINI 0.06* 0.03 <0.03   BNP: Invalid input(s): POCBNP CBG:  Recent Labs Lab 04/04/15 2042 04/05/15 1124  GLUCAP 107* 89    No results found for this or any previous visit (from the past 240 hour(s)).   Studies:              All Imaging reviewed and is as per above notation   Scheduled Meds: . aspirin EC  81 mg Oral QPM  . ferrous sulfate  325 mg Oral Q breakfast  . folic acid  2 mg Oral Daily  . heparin  5,000 Units Subcutaneous 3 times per day  . levothyroxine  125 mcg Oral QAC breakfast  . metoprolol tartrate  25 mg Oral BID  . mirtazapine  15 mg Oral QHS  . piperacillin-tazobactam (ZOSYN)  IV  3.375 g Intravenous  Q8H  . rosuvastatin  20 mg Oral q1800  . saccharomyces boulardii  250 mg Oral BID  . sodium chloride  3 mL Intravenous Q12H  . temazepam  30 mg Oral QHS   Continuous Infusions:    Assessment/Plan: 1. Early sepsis 2/2 to cellulitis.  Narrow IV Vanc/Zosyn-->Augmentin. WBC 15-->10. Nurse to mark out area for am comparision.  Would follow blood culture.  As has PPM in situ, worry about infected PPM lead-Pocket on exam however is reassuring.  If she clinically defervesces could d/c home with close OP follow up.  At this stage would hold on rpt echo as recent echo 4.28.16 was reassuring with absence of any vegetation 2. Rheum Heart disease s/p Porcine valve repairs-Continue Metorpolol 25 bid.  Not Coumadin candidate 2/2 to falls 3. Bimodal acute decompensated HF with contribution from [place] [facility] team-please  address when to start diuresis.  4. Afib s/p Defibrillator c complication of prolonged Qtc c recent AICD shocks in [place]. ? [place] score not on AC-controlled on BB.  ASa 81 mg.  Get Magnesium and K in am.  Appreciate Cardiology input 5. 2/2 PAH-RHC timing per cardiology. 6. DM ty 2-A1c 6.8 this admit-would address CBG control with sulfonylurea low dose as OP with dietary education.  No metformin given renal issues. 7. HLd-continue crestor 20 qd 8. Normocytic anemia-continue Iron-get iron studies as OP-stable. TCP is mild but would rpt labs in am 9. Tcp-Monitor plt count in am 10. Hypothyroid-cont Levothyroxine 125 mcg daily-tsh as OP in 3 weeks-stable   Code Status: full Family Communication: breif discussion c daughter on phone Disposition Plan:  Inpatinet, d/c tele-likely home am if all stable   Verneita Griffes, MD  [facility] Pager [phone] 04/05/2015, 12:22 PM    LOS: 1 day

## 2015-04-06 LAB — MAGNESIUM: Magnesium: 2.1 mg/dL (ref 1.7–2.4)

## 2015-04-06 LAB — COMPREHENSIVE METABOLIC PANEL
ALT: 12 U/L — ABNORMAL LOW (ref 14–54)
AST: 21 U/L (ref 15–41)
Albumin: 3 g/dL — ABNORMAL LOW (ref 3.5–5.0)
Alkaline Phosphatase: 69 U/L (ref 38–126)
Anion gap: 10 (ref 5–15)
BUN: 26 mg/dL — AB (ref 6–20)
CHLORIDE: 105 mmol/L (ref 101–111)
CO2: 23 mmol/L (ref 22–32)
CREATININE: 1.81 mg/dL — AB (ref 0.44–1.00)
Calcium: 9.6 mg/dL (ref 8.9–10.3)
GFR calc Af Amer: 31 mL/min — ABNORMAL LOW (ref >60)
GFR, EST NON AFRICAN AMERICAN: 27 mL/min — AB (ref >60)
GLUCOSE: 94 mg/dL (ref 65–99)
Potassium: 3.7 mmol/L (ref 3.5–5.1)
Sodium: 138 mmol/L (ref 135–145)
TOTAL PROTEIN: 7.1 g/dL (ref 6.5–8.1)
Total Bilirubin: 0.6 mg/dL (ref 0.3–1.2)

## 2015-04-06 LAB — CBC WITH DIFFERENTIAL/PLATELET
BASOS ABS: 0 10*3/uL (ref 0.0–0.1)
Basophils Relative: 0 % (ref 0–1)
EOS ABS: 0.3 10*3/uL (ref 0.0–0.7)
EOS PCT: 2 % (ref 0–5)
HCT: 34.5 % — ABNORMAL LOW (ref 36.0–46.0)
HEMOGLOBIN: 10.8 g/dL — AB (ref 12.0–15.0)
LYMPHS PCT: 19 % (ref 12–46)
Lymphs Abs: 2.3 10*3/uL (ref 0.7–4.0)
MCH: 29 pg (ref 26.0–34.0)
MCHC: 31.3 g/dL (ref 30.0–36.0)
MCV: 92.7 fL (ref 78.0–100.0)
Monocytes Absolute: 0.9 10*3/uL (ref 0.1–1.0)
Monocytes Relative: 7 % (ref 3–12)
NEUTROS PCT: 72 % (ref 43–77)
Neutro Abs: 8.9 10*3/uL — ABNORMAL HIGH (ref 1.7–7.7)
Platelets: 201 10*3/uL (ref 150–400)
RBC: 3.72 MIL/uL — ABNORMAL LOW (ref 3.87–5.11)
RDW: 17.4 % — ABNORMAL HIGH (ref 11.5–15.5)
WBC: 12.3 10*3/uL — AB (ref 4.0–10.5)

## 2015-04-06 LAB — VANCOMYCIN, RANDOM: Vancomycin Rm: 18 ug/mL

## 2015-04-06 LAB — GLUCOSE, CAPILLARY
GLUCOSE-CAPILLARY: 104 mg/dL — AB (ref 65–99)
Glucose-Capillary: 91 mg/dL (ref 65–99)
Glucose-Capillary: 84 mg/dL (ref 65–99)
Glucose-Capillary: 106 mg/dL — ABNORMAL HIGH (ref 65–99)

## 2015-04-06 MED ORDER — PRAMIPEXOLE DIHYDROCHLORIDE 0.25 MG PO TABS
0.2500 mg | ORAL_TABLET | Freq: Every evening | ORAL | Status: DC | PRN
  Filled 2015-04-06: qty 1

## 2015-04-06 MED ORDER — POTASSIUM CHLORIDE CRYS ER 20 MEQ PO TBCR
40.0000 meq | EXTENDED_RELEASE_TABLET | Freq: Every day | ORAL | Status: DC
  Administered 2015-04-06 – 2015-04-07 (×2): 40 meq via ORAL
  Filled 2015-04-06 (×2): qty 2

## 2015-04-06 MED ORDER — OXYCODONE-ACETAMINOPHEN 7.5-325 MG PO TABS: 1.0000 | ORAL_TABLET | ORAL | Status: DC | PRN

## 2015-04-06 MED ORDER — TORSEMIDE 20 MG PO TABS
20.0000 mg | ORAL_TABLET | Freq: Two times a day (BID) | ORAL | Status: DC
  Administered 2015-04-07: 20 mg via ORAL
  Filled 2015-04-06 (×3): qty 1

## 2015-04-06 MED ORDER — OXYCODONE-ACETAMINOPHEN 5-325 MG PO TABS
1.5000 | ORAL_TABLET | ORAL | Status: DC | PRN
  Administered 2015-04-06 – 2015-04-07 (×3): 1.5 via ORAL
  Filled 2015-04-06 (×3): qty 2

## 2015-04-06 MED ORDER — MAGNESIUM OXIDE 400 (241.3 MG) MG PO TABS
400.0000 mg | ORAL_TABLET | Freq: Every day | ORAL | Status: DC
  Administered 2015-04-06 – 2015-04-07 (×2): 400 mg via ORAL
  Filled 2015-04-06 (×2): qty 1

## 2015-04-06 MED ORDER — TORSEMIDE 20 MG PO TABS
20.0000 mg | ORAL_TABLET | Freq: Two times a day (BID) | ORAL | Status: DC
  Filled 2015-04-06: qty 1

## 2015-04-06 MED ORDER — AMOXICILLIN-POT CLAVULANATE 500-125 MG PO TABS
1.0000 | ORAL_TABLET | Freq: Two times a day (BID) | ORAL | Status: DC
  Administered 2015-04-06 – 2015-04-07 (×2): 500 mg via ORAL
  Filled 2015-04-06 (×3): qty 1

## 2015-04-06 NOTE — Progress Notes (Signed)
[name] UMP:[id number] DOB: 01/13/1941 DOA: 04/04/2015 PCP: Burton Apley, MD  Brief narrative: 74 y/o ? recent admit 4.27-->03/12/15 Sepsis 2/2 ? Pneumonia/lower extremity cellulitis + ICD firing in setting of use of azithromycin/ recent Tdp/hypokalemia [no driving X6 months]+ , rheumatic heart disease + MVR/TVR  [porcine] repairs,A. fib + Maze procedure, PAH with poor tolerance to revatio, PUD s/p Partial gastrectomy nonobstructive CAD based on cath 03/30/13, poor compliance with medical therapy represented to the hospital 04/04/15 high fever lower extremity pain and swelling, white count 16, 6/10 chest pain  Past medical history-As per Problem list Chart reviewed as below- reviewed  Consultants:  Cardiology  Procedures:  none  Antibiotics:  Vanc + Zosyn 5/24-->5/25  Augmentin 04/05/15---   Subjective   Pleasant No distress, feels better No chest pain No nausea No vomiting Tolerating diet Lower extremities do not feel painful as prior   Objective    Interim History:   Telemetry:    Objective: Filed Vitals:   04/05/15 2001 04/06/15 0500 04/06/15 0514 04/06/15 1002  BP: 116/56 141/65  114/60  Pulse: 68 70  71  Temp: 99.6 F (37.6 C) 99.5 F (37.5 C)  98.6 F (37 C)  TempSrc: Oral Oral  Oral  Resp: 18 18  18   Height:      Weight:   65.635 kg (144 lb 11.2 oz)   SpO2: 98% 100%  95%    Intake/Output Summary (Last 24 hours) at 04/06/15 1627 Last data filed at 04/06/15 1300  Gross per 24 hour  Intake   1020 ml  Output    950 ml  Net     70 ml    Exam:  General: eomi, ncat Cardiovascular: s1 s 2no m/r/g no JVD,midline chest scar, PPM has no pain to the pocket Respiratory: clear no added sound Abdomen: soft, NT, Nd Skin marked out and appears that the redness is receding Neurointact  Data Reviewed: Basic Metabolic Panel:  Recent Labs Lab 04/04/15 0925 04/05/15 0459 04/06/15 0445  NA 132* 138 138  K 4.2 3.8 3.7  CL 102 106 105  CO2 20*  22 23  GLUCOSE 139* 118* 94  BUN 21* 24* 26*  CREATININE 1.81* 1.79* 1.81*  CALCIUM 9.3 9.4 9.6  MG  --   --  2.1   Liver Function Tests:  Recent Labs Lab 04/04/15 0925 04/06/15 0445  AST 21 21  ALT 10* 12*  ALKPHOS 73 69  BILITOT 1.0 0.6  PROT 7.0 7.1  ALBUMIN 3.0* 3.0*   No results for input(s): LIPASE, AMYLASE in the last 168 hours. No results for input(s): AMMONIA in the last 168 hours. CBC:  Recent Labs Lab 04/04/15 0925 04/05/15 0459 04/06/15 0445  WBC 15.0* 8.8 12.3*  NEUTROABS 12.6*  --  8.9*  HGB 9.8* 9.5* 10.8*  HCT 30.8* 30.6* 34.5*  MCV 90.6 91.9 92.7  PLT 152 137* 201   Cardiac Enzymes:  Recent Labs Lab 04/04/15 0925 04/04/15 1443 04/04/15 2024  TROPONINI 0.06* 0.03 <0.03   BNP: Invalid input(s): POCBNP CBG:  Recent Labs Lab 04/05/15 1124 04/05/15 1715 04/05/15 2105 04/06/15 0651 04/06/15 1156  GLUCAP 89 100* 113* 91 84    No results found for this or any previous visit (from the past 240 hour(s)).   Studies:              All Imaging reviewed and is as per above notation   Scheduled Meds: . amoxicillin-clavulanate  1 tablet Oral BID  . aspirin EC  81 mg Oral QPM  . ferrous sulfate  325 mg Oral Q breakfast  . folic acid  2 mg Oral Daily  . heparin  5,000 Units Subcutaneous 3 times per day  . levothyroxine  125 mcg Oral QAC breakfast  . magnesium oxide  400 mg Oral Daily  . metoprolol tartrate  25 mg Oral BID  . mirtazapine  15 mg Oral QHS  . potassium chloride  40 mEq Oral Daily  . rosuvastatin  20 mg Oral q1800  . saccharomyces boulardii  250 mg Oral BID  . sodium chloride  3 mL Intravenous Q12H  . temazepam  30 mg Oral QHS  . [START ON 04/07/2015] torsemide  20 mg Oral BID   Continuous Infusions:    Assessment/Plan: 1. Early sepsis 2/2 to cellulitis.  Narrow IV Vanc/Zosyn-->Augmentin. WBC 15-->12 with low-grade fever overnight . Nurse to mark out area for am comparision.   PPM in situ but on exam pacemaker pocket  nontender.hold on rpt echo as recent echo 4.28.16 was reassuring with absence of any vegetation--repeat CBC plus differential and monitor 1 more night--likely can discharge a.m. If no fever or ?  wbc 2. Rheum Heart disease s/p Porcine valve repairs-Continue Metorpolol 25 bid.  Not Coumadin candidate 2/2 to falls 3. Bimodal acute decompensated HF -appreciate heart failure team input  4. Afib s/p Defibrillator c complication of prolonged Qtc c recent AICD shocks in [place]. ? [place] score not on AC-controlled on BB.  ASa 81 mg. replacing aggressively her  Magnesium and K .  Appreciate Cardiology input 5. 2/2 PAH-RHC timing per cardiology. 6. DM ty 2-A1c 6.8 this admit-would address CBG control with sulfonylurea low dose as OP with dietary education.  No metformin given renal issues. 7. HLd-continue crestor 20 qd 8. Normocytic anemia-continue Iron-get iron studies as OP-stable.  9. Tcp-probably lab artifact and resolved  10. Hypothyroid-cont Levothyroxine 125 mcg daily-tsh as OP in 3 weeks-stable   Code Status: full Family Communication: called daughter Dionne Bucy [phone] was "busy" Disposition Plan:  Inpatinet, d/c tele-likely home am if all stable   Verneita Griffes, MD  [facility] Pager [phone] 04/06/2015, 4:27 PM    LOS: 2 days

## 2015-04-06 NOTE — Progress Notes (Signed)
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 (not on coumadin due to falls).   Subjective:    Feeling ok breathing wise.  Lying flat without difficulty.  Denies CP or PND.   Objective:    Vital Signs:   Temp:  [98.6 F (37 C)-99.6 F (37.6 C)] 98.6 F (37 C) (05/26 1002) Pulse Rate:  [68-71] 71 (05/26 1002) Resp:  [18] 18 (05/26 1002) BP: (114-141)/(55-65) 114/60 mmHg (05/26 1002) SpO2:  [95 %-100 %] 95 % (05/26 1002) Weight:  [65.635 kg (144 lb 11.2 oz)] 65.635 kg (144 lb 11.2 oz) (05/26 0514) Last BM Date: 04/03/15  Weight change: Filed Weights   04/04/15 0653 04/05/15 0513 04/06/15 0514  Weight: 65.817 kg (145 lb 1.6 oz) 65.681 kg (144 lb 12.8 oz) 65.635 kg (144 lb 11.2 oz)    Intake/Output:   Intake/Output Summary (Last 24 hours) at 04/06/15 1433 Last data filed at 04/06/15 1300  Gross per 24 hour  Intake   1020 ml  Output    950 ml  Net     70 ml     Physical Exam: General: Pleasant, NAD, lying flat. Chronically ill appearing. Neuro: Alert and oriented X 3. Moves all extremities spontaneously. Psych: Normal affect. HEENT: Normal Neck: Supple. JVP to jaw with prominent with + CV waves. Carotids 2+. No bruits. No lymphadenopathy or thyromegaly appreciated Lungs: Resp regular and unlabored, scattered rales Heart: PMI normal. RRR. No rubs, gallops. 3/6 HSM heard best at LLSB Abdomen: Soft, non-tender, non-distended, BS + x 4.  Extremities: No clubbing or cyanosis. DP/PT/Radials 2+ and equal bilaterally. Trace edema. Improving cellulitic rash bilateral LE.   Telemetry: Atrial pacing @ 70 bpm  Labs: CBC  Recent Labs  04/04/15 0925  04/05/15 0459 04/06/15 0445  WBC 15.0* 8.8 12.3*  NEUTROABS 12.6*  --  8.9*  HGB 9.8* 9.5* 10.8*  HCT 30.8* 30.6* 34.5*  MCV 90.6 91.9 92.7  PLT 152 137* 709   Basic Metabolic Panel  Recent Labs  04/05/15 0459 04/06/15 0445  NA 138 138  K 3.8 3.7  CL 106 105  CO2 22 23  GLUCOSE 118* 94  BUN 24* 26*  CALCIUM 9.4 9.6  MG  --  2.1   Liver Function Tests  Recent Labs  04/04/15 0925 04/06/15 0445  AST 21 21  ALT 10* 12*  ALKPHOS 73 69  BILITOT 1.0 0.6  PROT 7.0 7.1  ALBUMIN 3.0* 3.0*    Cardiac Enzymes  Recent Labs  04/04/15 0925 04/04/15 1443 04/04/15 2024  TROPONINI 0.06* 0.03 <0.03    BNP: BNP (last 3 results)  Recent Labs  03/08/15 2333 03/28/15 1600  BNP 698.3* 220.7*   Hemoglobin A1C  Recent Labs  04/04/15 0925  HGBA1C 6.8*    Other results:     Imaging/Studies:  No results found.  Latest Echo  Echo (4/16) with EF 55-60%, normal bioprosthetic mitral valve, no aortic stenosis, moderately dilated RV with moderately decreased systolic function, moderate TR, PA systolic pressure 63 mmHg.  Latest Cath/Work up  Myoview 02/2014 -Low risk stress nuclear study with small, mild, fixed anterior and inferior defects consistent with soft tissue attenuation; no ischemia. LV Ejection Fraction: 61%. LV  Wall Motion: NL LV Function; NL Wall Motion   08/12/12 RHC RA = 15 v= 21  RV = 64/3/13  PA = 59/16 (33)  PCW = 14  Fick cardiac output/index = 4.1/2.4  PVR = 7.8 Woods  FA sat = 98%  PA sat = 60%, 63%   Medications:     Scheduled Medications: . amoxicillin-clavulanate  1 tablet Oral BID  . aspirin EC  81 mg Oral QPM  . ferrous sulfate  325 mg Oral Q breakfast  . folic acid  2 mg Oral Daily  . heparin  5,000 Units Subcutaneous 3 times per day  . levothyroxine  125 mcg Oral QAC breakfast  . magnesium oxide  400 mg Oral Daily  . metoprolol tartrate  25 mg Oral BID  . mirtazapine  15 mg Oral QHS  . potassium chloride  40 mEq  Oral Daily  . rosuvastatin  20 mg Oral q1800  . saccharomyces boulardii  250 mg Oral BID  . sodium chloride  3 mL Intravenous Q12H  . temazepam  30 mg Oral QHS    Infusions:    PRN Medications: acetaminophen **OR** acetaminophen, albuterol, alum & mag hydroxide-simeth, LORazepam, nitroGLYCERIN, oxyCODONE-acetaminophen, polyethylene glycol, pramipexole, promethazine   Assessment/Plan   1. Bilateral LE cellulitis with sepsis - per primary team 2. Chronic diastolic heart failure - weight stable but still slightly up compared to last discharge 3. A/C real failure, stage 4 - improving, still up from baseline. 4. H/o VT/VF with QT prolongation and hypokalemia. Keep K> 4.0  5. PAF - no coumadin due to falls 6. Rheumatic heart disease with secondary PAH  Cellulitis improving. Volume status stable. Will restart diuretics tomorrow. Watch electrolytes closely.   Appreciate [facility] care,   Ranae Casebier,MD   Advanced Heart Failure Team Pager [phone] (M-F; 7a - 4p)  Please contact [facility] for night-coverage after hours (4p -7a ) and weekends on [email or web address]

## 2015-04-06 NOTE — Progress Notes (Signed)
Patient is active with [facility] ( connected with [facility], [place]) tele # [phone] fax # [phone]; Attending MD please enter HHRN/PT for resumption of services at discharge; Aneta Mins [phone]

## 2015-04-07 DIAGNOSIS — I209 Angina pectoris, unspecified: Secondary | ICD-10-CM

## 2015-04-07 LAB — COMPREHENSIVE METABOLIC PANEL
ALT: 9 U/L — AB (ref 14–54)
ANION GAP: 8 (ref 5–15)
AST: 18 U/L (ref 15–41)
Albumin: 2.8 g/dL — ABNORMAL LOW (ref 3.5–5.0)
Alkaline Phosphatase: 62 U/L (ref 38–126)
BUN: 22 mg/dL — ABNORMAL HIGH (ref 6–20)
CALCIUM: 9.1 mg/dL (ref 8.9–10.3)
CO2: 20 mmol/L — ABNORMAL LOW (ref 22–32)
Chloride: 105 mmol/L (ref 101–111)
Creatinine, Ser: 1.49 mg/dL — ABNORMAL HIGH (ref 0.44–1.00)
GFR calc Af Amer: 40 mL/min — ABNORMAL LOW (ref >60)
GFR calc non Af Amer: 34 mL/min — ABNORMAL LOW (ref >60)
GLUCOSE: 80 mg/dL (ref 65–99)
POTASSIUM: 4.1 mmol/L (ref 3.5–5.1)
Sodium: 133 mmol/L — ABNORMAL LOW (ref 135–145)
Total Bilirubin: 0.7 mg/dL (ref 0.3–1.2)
Total Protein: 6.3 g/dL — ABNORMAL LOW (ref 6.5–8.1)

## 2015-04-07 LAB — GLUCOSE, CAPILLARY: Glucose-Capillary: 107 mg/dL — ABNORMAL HIGH (ref 65–99)

## 2015-04-07 MED ORDER — AMOXICILLIN-POT CLAVULANATE 500-125 MG PO TABS: 1.0000 | ORAL_TABLET | Freq: Two times a day (BID) | ORAL | Status: DC

## 2015-04-07 MED ORDER — SPIRONOLACTONE 12.5 MG HALF TABLET
12.5000 mg | ORAL_TABLET | Freq: Every day | ORAL | Status: DC
  Filled 2015-04-07: qty 1

## 2015-04-07 MED ORDER — POTASSIUM CHLORIDE CRYS ER 20 MEQ PO TBCR: 20.0000 meq | EXTENDED_RELEASE_TABLET | Freq: Two times a day (BID) | ORAL | Status: DC

## 2015-04-07 MED ORDER — SPIRONOLACTONE 25 MG PO TABS: 12.5000 mg | ORAL_TABLET | Freq: Every day | ORAL | Status: DC

## 2015-04-07 MED ORDER — TORSEMIDE 20 MG PO TABS: 20.0000 mg | ORAL_TABLET | Freq: Two times a day (BID) | ORAL | Status: DC

## 2015-04-07 MED ORDER — FERROUS SULFATE 325 (65 FE) MG PO TABS: 325.0000 mg | ORAL_TABLET | Freq: Every day | ORAL | Status: DC

## 2015-04-07 MED ORDER — ROSUVASTATIN CALCIUM 20 MG PO TABS: 20.0000 mg | ORAL_TABLET | Freq: Every day | ORAL | Status: DC

## 2015-04-07 NOTE — Discharge Summary (Signed)
Physician Discharge Summary  EDIT RICCIARDELLI YFV:[id number] DOB: Mar 20, 1941 DOA: 04/04/2015  PCP: Burton Apley, MD  Admit date: 04/04/2015 Discharge date: 04/07/2015  Time spent: 35 minutes  Recommendations for Outpatient Follow-up:  1.  to meds as below     patient to take Augmentin twice a day until 04/11/15 and then stop -Rx = cellulitis   cardiology recommended Aldactone 12.5 daily, torsemide 20 twice a day, K-Dur 20 twice a day on discharge as well as basic metabolic panel within one week 2.  would recommend an outpatient TSH in about 3 weeks 3.  would recommend outpatient diabetic counseling and potential use of  sulfonylurea per PCP please note that I have not discussed this with  4.  she will be followed by home health  in [place] on discharge home  Discharge Diagnoses:  Principal Problem:   Sepsis Active Problems:   CORONARY ATHEROSCLEROSIS NATIVE CORONARY ARTERY   Pulmonary hypertension   S/P MVR (mitral valve replacement)   Chronic combined systolic and diastolic heart failure   Chest pain   Cellulitis   Discharge Condition:  stable   Diet recommendation: heart healthy low-salt  Filed Weights   04/05/15 0513 04/06/15 0514 04/07/15 0546  Weight: 65.681 kg (144 lb 12.8 oz) 65.635 kg (144 lb 11.2 oz) 65.545 kg (144 lb 8 oz)    History of present illness:  Brief narrative: 74 y/o ? recent admit 4.27-->03/12/15 Sepsis 2/2 ? Pneumonia/lower extremity cellulitis + ICD firing in setting of use of azithromycin/ recent Tdp/hypokalemia [no driving X6 months]+ , rheumatic heart disease + MVR/TVR [porcine] repairs,A. fib + Maze procedure, PAH with poor tolerance to revatio, PUD s/p Partial gastrectomy nonobstructive CAD based on cath 03/30/13, poor compliance with medical therapy represented to the hospital 04/04/15 high fever lower extremity pain and swelling, white count 16, 6/10 chest pain  see below history for further details    Hospital Course:   1. Early sepsis 2/2 to  cellulitis. Narrow IV Vanc/Zosyn-->Augmentin. WBC 15-->12-- the patient had some low-grade fevers but as clinically defervesced patient was. PPM in situ recent echo 4.28.16 was reassuring with absence of any vegetation The patientwas felt to have met clinical and metabolic parameters for safe discharge on  04/07/15 and will be discharged home with home health  2. Rheum Heart disease s/p Porcine valve repairs-Continue Metorpolol 25 bid. Not Coumadin candidate 2/2 to falls 3. Bimodal acute decompensated HF -appreciate heart failure team input - recommendations will be followed as an outpatient 4. Afib s/p Defibrillator c complication of prolonged Qtc c recent AICD shocks in [place]. ? [place] score not on AC-controlled on BB. ASa 81 mg. replacing aggressively her Magnesium and K . Appreciate Cardiology input 5. 2/2 PAH-RHC timing per cardiology. 6. DM ty 2-A1c 6.8 this admit-would address CBG control with sulfonylurea low dose as OP with dietary education. No metformin given renal issues. 7. HLd-continue crestor 20 qd her simvastatin from home was stopped 8. Normocytic anemia-continue Iron-get iron studies as OP-stable.  9. Tcp-probably lab artifact and resolved  10. Hypothyroid-cont Levothyroxine 125 mcg daily-tsh as OP in 3 weeks-stable  Consultants: 11. Cardiology  Procedures:  none  Antibiotics:  Vanc + Zosyn 5/24-->5/25  Augmentin 04/05/15--- 04/11/15 as an outpatient  Discharge Exam: Filed Vitals:   04/07/15 1012  BP: 121/61  Pulse: 68  Temp:   Resp:     General: alet pleasant oriented in nad Cardiovascular: s1 s 2no m/r/g Respiratory: clear  Discharge Instructions   Discharge Instructions    Diet -  low sodium heart healthy    Complete by:  As directed      Increase activity slowly    Complete by:  As directed           Current Discharge Medication List    START taking these medications   Details  amoxicillin-clavulanate (AUGMENTIN) 500-125 MG per  tablet Take 1 tablet (500 mg total) by mouth 2 (two) times daily. Qty: 10 tablet, Refills: 0    rosuvastatin (CRESTOR) 20 MG tablet Take 1 tablet (20 mg total) by mouth daily at 6 PM. Qty: 30 tablet, Refills: 0      CONTINUE these medications which have CHANGED   Details  ferrous sulfate 325 (65 FE) MG tablet Take 1 tablet (325 mg total) by mouth daily with breakfast. Qty: 30 tablet, Refills: 0    potassium chloride SA (K-DUR,KLOR-CON) 20 MEQ tablet Take 1 tablet (20 mEq total) by mouth 2 (two) times daily. Qty: 120 tablet, Refills: 3    spironolactone (ALDACTONE) 25 MG tablet Take 0.5 tablets (12.5 mg total) by mouth daily. Qty: 30 tablet, Refills: 6    torsemide (DEMADEX) 20 MG tablet Take 1 tablet (20 mg total) by mouth 2 (two) times daily. Qty: 60 tablet, Refills: 0      CONTINUE these medications which have NOT CHANGED   Details  albuterol (PROVENTIL HFA;VENTOLIN HFA) 108 (90 BASE) MCG/ACT inhaler Inhale 1 puff into the lungs every 6 (six) hours as needed for wheezing or shortness of breath.    cholecalciferol (VITAMIN D) 1000 UNITS tablet Take 1,000 Units by mouth daily.    folic acid (FOLVITE) 1 MG tablet Take 2 mg by mouth daily.     levothyroxine (SYNTHROID, LEVOTHROID) 125 MCG tablet Take 125 mcg by mouth daily before breakfast.    lisinopril (PRINIVIL,ZESTRIL) 5 MG tablet Take 2.5 mg by mouth daily.    magnesium oxide (MAG-OX) 400 (241.3 MG) MG tablet Take 400 mg by mouth daily. Refills: 3    Melatonin 5 MG TABS Take 10 mg by mouth at bedtime.    metoprolol tartrate (LOPRESSOR) 25 MG tablet Take 25 mg by mouth 2 (two) times daily.    mupirocin cream (BACTROBAN) 2 % Apply 1 application topically 3 (three) times daily.  Refills: 0    aspirin EC 81 MG tablet Take 81 mg by mouth every evening.    cyanocobalamin (,VITAMIN B-12,) 1000 MCG/ML injection Inject 1,000 mcg into the muscle every 30 (thirty) days.    hydrocodone-acetaminophen (LORCET PLUS) 7.5-650 MG per  tablet Take 1 tablet by mouth every 4 (four) hours as needed for pain. PRN Arthritis    LORazepam (ATIVAN) 1 MG tablet Take 1 mg by mouth at bedtime. Refills: 3    mirtazapine (REMERON) 15 MG tablet Take 15 mg by mouth at bedtime.     nitroGLYCERIN (NITROSTAT) 0.4 MG SL tablet Place 0.4 mg under the tongue every 5 (five) minutes as needed for chest pain.    ondansetron (ZOFRAN) 4 MG tablet Take 4 mg by mouth 4 (four) times daily as needed for nausea or vomiting. For nausea Refills: 1    oxyCODONE-acetaminophen (PERCOCET) 7.5-325 MG per tablet Take 1 tablet by mouth every 4 (four) hours as needed for moderate pain.     pramipexole (MIRAPEX) 0.25 MG tablet Take 0.25 mg by mouth. Take 1 tablet by mouth at 4 PM and 1 at bedtime. Refills: 3    temazepam (RESTORIL) 30 MG capsule Take 30 mg by mouth at bedtime.  STOP taking these medications     magnesium oxide (MAG-OX) 400 MG tablet      simvastatin (ZOCOR) 10 MG tablet        Allergies  Allergen Reactions  . Codeine Nausea And Vomiting  . Butorphanol Tartrate     REACTION: migraines, nervous/agitated  . Keflex [Cephalexin]     Listed as allergy at [facility], but tolerated cefepime here.  . Levofloxacin     Avoid QTc prolonging medications  . Lipitor [Atorvastatin Calcium] Diarrhea    nightmares  . Revatio [Sildenafil Citrate] Swelling  . Zofran [Ondansetron Hcl]     Avoid QTc prolonging medications      The results of significant diagnostics from this hospitalization (including imaging, microbiology, ancillary and laboratory) are listed below for reference.    Significant Diagnostic Studies: Dg Chest Port 1 View  04/04/2015   CLINICAL DATA:  Shortness of breath for 1 day.  Sepsis.  EXAM: PORTABLE CHEST - 1 VIEW  COMPARISON:  March 09, 2015  FINDINGS: There is stable scarring in the left apex region. Patchy atelectasis in the right mid and lower lung zones remains. There is no new opacity. There is no airspace  consolidation. Heart is mildly enlarged with pulmonary vascularity within normal limits. Pacemaker leads are attached to the right atrium and right ventricle. A left atrial clamp is present. Patient is status post median sternotomy. No adenopathy.  IMPRESSION: No edema or consolidation. Atelectatic change on the right. Scarring left apex. No new opacity. Stable cardiac prominence.   Electronically Signed   By: Lowella Grip III M.D.   On: 04/04/2015 08:59   Dg Chest Port 1 View  03/09/2015   CLINICAL DATA:  Follow-up cough  EXAM: PORTABLE CHEST - 1 VIEW  COMPARISON:  04/01/2013  FINDINGS: Cardiomegaly persists. Low volumes. Left jugular venous catheter removed. Right subclavian AICD stable.  Left basilar airspace disease improved. Right upper lobe airspace disease improved. Bilateral lower lung linear atelectasis persists. No pneumothorax. Pleural parenchymal opacity at the left apex is likely chronic.  IMPRESSION: Improved bilateral airspace disease.   Electronically Signed   By: Marybelle Killings M.D.   On: 03/09/2015 12:16    Microbiology: No results found for this or any previous visit (from the past 240 hour(s)).   Labs: Basic Metabolic Panel:  Recent Labs Lab 04/04/15 0925 04/05/15 0459 04/06/15 0445 04/07/15 0617  NA 132* 138 138 133*  K 4.2 3.8 3.7 4.1  CL 102 106 105 105  CO2 20* 22 23 20*  GLUCOSE 139* 118* 94 80  BUN 21* 24* 26* 22*  CREATININE 1.81* 1.79* 1.81* 1.49*  CALCIUM 9.3 9.4 9.6 9.1  MG  --   --  2.1  --    Liver Function Tests:  Recent Labs Lab 04/04/15 0925 04/06/15 0445 04/07/15 0617  AST 21 21 18   ALT 10* 12* 9*  ALKPHOS 73 69 62  BILITOT 1.0 0.6 0.7  PROT 7.0 7.1 6.3*  ALBUMIN 3.0* 3.0* 2.8*   No results for input(s): LIPASE, AMYLASE in the last 168 hours. No results for input(s): AMMONIA in the last 168 hours. CBC:  Recent Labs Lab 04/04/15 0925 04/05/15 0459 04/06/15 0445  WBC 15.0* 8.8 12.3*  NEUTROABS 12.6*  --  8.9*  HGB 9.8* 9.5*  10.8*  HCT 30.8* 30.6* 34.5*  MCV 90.6 91.9 92.7  PLT 152 137* 201   Cardiac Enzymes:  Recent Labs Lab 04/04/15 0925 04/04/15 1443 04/04/15 2024  TROPONINI 0.06* 0.03 <0.03  BNP: BNP (last 3 results)  Recent Labs  03/08/15 2333 03/28/15 1600  BNP 698.3* 220.7*    ProBNP (last 3 results) No results for input(s): PROBNP in the last 8760 hours.  CBG:  Recent Labs Lab 04/05/15 2105 04/06/15 0651 04/06/15 1156 04/06/15 1703 04/06/15 2124  GLUCAP 113* 91 84 106* 104*       Signed:  Nita Sells  [facility] 04/07/2015, 11:23 AM

## 2015-04-07 NOTE — Progress Notes (Signed)
Advanced Heart Failure Rounding Note  PCP: Burton Apley, MD Primary Cardiologist: Domenic Polite, Jaja Switalski  Pt profile: [name] is a 74 yo female with h/o DM2, HTN, L breast CA s/p mastectomy/chemo/XRT 8/08, PUD s/p partial gastrectomy, rheumatic fever with MS/MR/TR , CVA with transient R sided weakness 12/11, CKD, recurrent VT/VF s/p ICD and PAF s/p MAZE. (not on coumadin due to falls).   Subjective:    Denies SOB/Orthopnea. Taking oral antibiotics. Wants to go home   Objective:    Vital Signs:   Temp:  [98 F (36.7 C)-98.7 F (37.1 C)] 98.4 F (36.9 C) (05/27 0546) Pulse Rate:  [68-72] 68 (05/27 0546) Resp:  [18-20] 20 (05/27 0546) BP: (114-137)/(60-66) 137/63 mmHg (05/27 0546) SpO2:  [95 %-100 %] 100 % (05/27 0546) Weight:  [144 lb 8 oz (65.545 kg)] 144 lb 8 oz (65.545 kg) (05/27 0546) Last BM Date: 04/06/15  Weight change: Filed Weights   04/05/15 0513 04/06/15 0514 04/07/15 0546  Weight: 144 lb 12.8 oz (65.681 kg) 144 lb 11.2 oz (65.635 kg) 144 lb 8 oz (65.545 kg)    Intake/Output:   Intake/Output Summary (Last 24 hours) at 04/07/15 0802 Last data filed at 04/07/15 0550  Gross per 24 hour  Intake    970 ml  Output    750 ml  Net    220 ml     Physical Exam: General: Pleasant, NAD, lying flat. Chronically ill appearing. Neuro: Alert and oriented X 3. Moves all extremities spontaneously. Psych: Normal affect. HEENT: Normal Neck: Supple. JVP to jaw with prominent with + CV waves. Carotids 2+. No bruits. No lymphadenopathy or thyromegaly appreciated Lungs: Resp regular and unlabored, scattered rales Heart: PMI normal. RRR. No rubs, gallops. 3/6 HSM heard best at LLSB Abdomen: Soft, non-tender, non-distended, BS + x 4.  Extremities: No clubbing or cyanosis. DP/PT/Radials 2+ and equal bilaterally. Trace edema. Improving cellulitic rash bilateral LE.   Telemetry: Atrial pacing 70s Labs: CBC  Recent Labs  04/04/15 0925 04/05/15 0459 04/06/15 0445   WBC 15.0* 8.8 12.3*  NEUTROABS 12.6*  --  8.9*  HGB 9.8* 9.5* 10.8*  HCT 30.8* 30.6* 34.5*  MCV 90.6 91.9 92.7  PLT 152 137* 322   Basic Metabolic Panel  Recent Labs  04/06/15 0445 04/07/15 0617  NA 138 133*  K 3.7 4.1  CL 105 105  CO2 23 20*  GLUCOSE 94 80  BUN 26* 22*  CALCIUM 9.6 9.1  MG 2.1  --    Liver Function Tests  Recent Labs  04/06/15 0445 04/07/15 0617  AST 21 18  ALT 12* 9*  ALKPHOS 69 62  BILITOT 0.6 0.7  PROT 7.1 6.3*  ALBUMIN 3.0* 2.8*    Cardiac Enzymes  Recent Labs  04/04/15 0925 04/04/15 1443 04/04/15 2024  TROPONINI 0.06* 0.03 <0.03    BNP: BNP (last 3 results)  Recent Labs  03/08/15 2333 03/28/15 1600  BNP 698.3* 220.7*   Hemoglobin A1C  Recent Labs  04/04/15 0925  HGBA1C 6.8*    Other results:     Imaging/Studies:  No results found.  Latest Echo  Echo (4/16) with EF 55-60%, normal bioprosthetic mitral valve, no aortic stenosis, moderately dilated RV with moderately decreased systolic function, moderate TR, PA systolic pressure 63 mmHg.  Latest Cath/Work up  Myoview 02/2014 -Low risk stress nuclear study with small, mild, fixed anterior and inferior defects consistent with soft tissue attenuation; no ischemia. LV Ejection Fraction: 61%. LV Wall Motion: NL LV Function; NL Wall Motion  08/12/12 RHC RA = 15 v= 21  RV = 64/3/13  PA = 59/16 (33)  PCW = 14  Fick cardiac output/index = 4.1/2.4  PVR = 7.8 [place]  FA sat = 98%  PA sat = 60%, 63%   Medications:     Scheduled Medications: . amoxicillin-clavulanate  1 tablet Oral BID  . aspirin EC  81 mg Oral QPM  . ferrous sulfate  325 mg Oral Q breakfast  . folic acid  2 mg Oral Daily  . heparin  5,000 Units Subcutaneous 3 times per day  . levothyroxine  125 mcg Oral QAC breakfast  . magnesium oxide  400 mg Oral Daily  . metoprolol tartrate  25 mg Oral BID  . mirtazapine  15 mg Oral QHS  . potassium chloride  40 mEq Oral Daily  . rosuvastatin  20  mg Oral q1800  . saccharomyces boulardii  250 mg Oral BID  . sodium chloride  3 mL Intravenous Q12H  . temazepam  30 mg Oral QHS  . torsemide  20 mg Oral BID    Infusions:    PRN Medications: acetaminophen **OR** acetaminophen, albuterol, alum & mag hydroxide-simeth, LORazepam, nitroGLYCERIN, oxyCODONE-acetaminophen, polyethylene glycol, pramipexole, promethazine   Assessment/Plan   1. Bilateral LE cellulitis with sepsis - per primary team 2. Chronic diastolic heart failure - weight stable but still slightly up compared to last discharge 3. A/C real failure, stage 4 - improving, still up from baseline. 4. H/o VT/VF with QT prolongation and hypokalemia. Keep K> 4.0  5. PAF - no coumadin due to falls 6. Rheumatic heart disease with secondary PAH  Cellulitis improving. On po antibiotics.  Volume status stable. Torsemide restarted today.     CLEGG,AMY NP-C  Advanced Heart Failure Team Pager [phone] (M-F; 7a - 4p)  Please contact [facility] for night-coverage after hours (4p -7a ) and weekends on [email or web address]  Patient seen and examined with Darrick Grinder, NP. We discussed all aspects of the encounter. I agree with the assessment and plan as stated above.   Renal function and cellulitis improved. Restarting demadex today.  Can go home from our standpoint on torsemide 20 bid and spiro 12.5 daily with kcl 20 bid. Will need BMET early next week. We will sign off. Please call with questions.   Aamirah Salmi,MD 10:18 AM

## 2015-04-07 NOTE — Care Management Note (Signed)
Case Management Note  Patient Details  Name: [name] MRN: [id number] Date of Birth: [birth year]            Action/Plan:  Patient is acitve with [facility] through [facility]; Resumptions of orders faxed to [phone];   Expected Discharge Date:       04/07/2015           Expected Discharge Plan:  [place]  Discharge planning Services  CM Consult Choice offered to:  Patient  DME Agency:  NA  HH Arranged:  RN, PT [facility] Agency:   [facility]  Status of Service:   completed  Medicare Important Message Given:  Yes Date Medicare IM Given:  04/07/15 Medicare IM give by:  Mindi Slicker RN   Royston Bake, RN,BSN,MHA [phone] 04/07/2015, 10:23 AM

## 2015-04-07 NOTE — Progress Notes (Signed)
Discharge education completed by RN. Pt and daughter received a copy of discharge paperwork and confirm understanding of follow up appointments and discharge medications. Both deny any questions at this time. IV removed, site is within normal limits. Pt will discharge from the unit via wheelchair. 

## 2015-05-01 DIAGNOSIS — L03119 Cellulitis of unspecified part of limb: Secondary | ICD-10-CM

## 2015-05-01 DIAGNOSIS — L02419 Cutaneous abscess of limb, unspecified: Secondary | ICD-10-CM

## 2015-05-01 HISTORY — DX: Cellulitis of unspecified part of limb: L03.119

## 2015-05-01 HISTORY — DX: Cutaneous abscess of limb, unspecified: L02.419

## 2015-05-02 ENCOUNTER — Encounter (HOSPITAL_COMMUNITY): Payer: Self-pay | Admitting: Emergency Medicine

## 2015-05-02 ENCOUNTER — Inpatient Hospital Stay (HOSPITAL_COMMUNITY)
Admission: EM | Admit: 2015-05-02 | Discharge: 2015-05-05 | DRG: 872 | Disposition: A | Discharge status: Home Health | Payer: Medicare Other | Attending: Internal Medicine | Admitting: Internal Medicine

## 2015-05-02 DIAGNOSIS — I469 Cardiac arrest, cause unspecified: Secondary | ICD-10-CM | POA: Diagnosis present

## 2015-05-02 DIAGNOSIS — Z95810 Presence of automatic (implantable) cardiac defibrillator: Secondary | ICD-10-CM | POA: Diagnosis not present

## 2015-05-02 DIAGNOSIS — E861 Hypovolemia: Secondary | ICD-10-CM | POA: Diagnosis present

## 2015-05-02 DIAGNOSIS — I2510 Atherosclerotic heart disease of native coronary artery without angina pectoris: Secondary | ICD-10-CM | POA: Diagnosis present

## 2015-05-02 DIAGNOSIS — J45909 Unspecified asthma, uncomplicated: Secondary | ICD-10-CM | POA: Diagnosis present

## 2015-05-02 DIAGNOSIS — I4891 Unspecified atrial fibrillation: Secondary | ICD-10-CM | POA: Diagnosis present

## 2015-05-02 DIAGNOSIS — E039 Hypothyroidism, unspecified: Secondary | ICD-10-CM | POA: Diagnosis present

## 2015-05-02 DIAGNOSIS — Z8711 Personal history of peptic ulcer disease: Secondary | ICD-10-CM | POA: Diagnosis not present

## 2015-05-02 DIAGNOSIS — I129 Hypertensive chronic kidney disease with stage 1 through stage 4 chronic kidney disease, or unspecified chronic kidney disease: Secondary | ICD-10-CM | POA: Diagnosis present

## 2015-05-02 DIAGNOSIS — I5042 Chronic combined systolic (congestive) and diastolic (congestive) heart failure: Secondary | ICD-10-CM | POA: Diagnosis present

## 2015-05-02 DIAGNOSIS — I639 Cerebral infarction, unspecified: Secondary | ICD-10-CM

## 2015-05-02 DIAGNOSIS — Z952 Presence of prosthetic heart valve: Secondary | ICD-10-CM

## 2015-05-02 DIAGNOSIS — Z0389 Encounter for observation for other suspected diseases and conditions ruled out: Secondary | ICD-10-CM

## 2015-05-02 DIAGNOSIS — A419 Sepsis, unspecified organism: Secondary | ICD-10-CM | POA: Diagnosis present

## 2015-05-02 DIAGNOSIS — E860 Dehydration: Secondary | ICD-10-CM | POA: Diagnosis present

## 2015-05-02 DIAGNOSIS — Z8679 Personal history of other diseases of the circulatory system: Secondary | ICD-10-CM

## 2015-05-02 DIAGNOSIS — E119 Type 2 diabetes mellitus without complications: Secondary | ICD-10-CM | POA: Diagnosis present

## 2015-05-02 DIAGNOSIS — I472 Ventricular tachycardia: Secondary | ICD-10-CM | POA: Diagnosis present

## 2015-05-02 DIAGNOSIS — Z9221 Personal history of antineoplastic chemotherapy: Secondary | ICD-10-CM | POA: Diagnosis not present

## 2015-05-02 DIAGNOSIS — F419 Anxiety disorder, unspecified: Secondary | ICD-10-CM | POA: Diagnosis present

## 2015-05-02 DIAGNOSIS — Z85828 Personal history of other malignant neoplasm of skin: Secondary | ICD-10-CM | POA: Diagnosis not present

## 2015-05-02 DIAGNOSIS — M797 Fibromyalgia: Secondary | ICD-10-CM | POA: Diagnosis present

## 2015-05-02 DIAGNOSIS — N183 Chronic kidney disease, stage 3 unspecified: Secondary | ICD-10-CM | POA: Diagnosis present

## 2015-05-02 DIAGNOSIS — D510 Vitamin B12 deficiency anemia due to intrinsic factor deficiency: Secondary | ICD-10-CM | POA: Diagnosis present

## 2015-05-02 DIAGNOSIS — I272 Other secondary pulmonary hypertension: Secondary | ICD-10-CM | POA: Diagnosis present

## 2015-05-02 DIAGNOSIS — E785 Hyperlipidemia, unspecified: Secondary | ICD-10-CM | POA: Diagnosis present

## 2015-05-02 DIAGNOSIS — Z79899 Other long term (current) drug therapy: Secondary | ICD-10-CM

## 2015-05-02 DIAGNOSIS — F329 Major depressive disorder, single episode, unspecified: Secondary | ICD-10-CM | POA: Diagnosis present

## 2015-05-02 DIAGNOSIS — M1990 Unspecified osteoarthritis, unspecified site: Secondary | ICD-10-CM | POA: Diagnosis present

## 2015-05-02 DIAGNOSIS — Z923 Personal history of irradiation: Secondary | ICD-10-CM | POA: Diagnosis not present

## 2015-05-02 DIAGNOSIS — I4721 Torsades de pointes: Secondary | ICD-10-CM

## 2015-05-02 DIAGNOSIS — M79662 Pain in left lower leg: Secondary | ICD-10-CM | POA: Diagnosis not present

## 2015-05-02 DIAGNOSIS — I270 Primary pulmonary hypertension: Secondary | ICD-10-CM | POA: Diagnosis not present

## 2015-05-02 DIAGNOSIS — Z853 Personal history of malignant neoplasm of breast: Secondary | ICD-10-CM

## 2015-05-02 DIAGNOSIS — I480 Paroxysmal atrial fibrillation: Secondary | ICD-10-CM | POA: Diagnosis not present

## 2015-05-02 DIAGNOSIS — Z8673 Personal history of transient ischemic attack (TIA), and cerebral infarction without residual deficits: Secondary | ICD-10-CM | POA: Diagnosis not present

## 2015-05-02 DIAGNOSIS — I4581 Long QT syndrome: Secondary | ICD-10-CM | POA: Diagnosis present

## 2015-05-02 DIAGNOSIS — K219 Gastro-esophageal reflux disease without esophagitis: Secondary | ICD-10-CM | POA: Diagnosis present

## 2015-05-02 DIAGNOSIS — Z9889 Other specified postprocedural states: Secondary | ICD-10-CM

## 2015-05-02 DIAGNOSIS — Z4502 Encounter for adjustment and management of automatic implantable cardiac defibrillator: Secondary | ICD-10-CM

## 2015-05-02 DIAGNOSIS — Z9012 Acquired absence of left breast and nipple: Secondary | ICD-10-CM | POA: Diagnosis present

## 2015-05-02 DIAGNOSIS — Z7982 Long term (current) use of aspirin: Secondary | ICD-10-CM

## 2015-05-02 DIAGNOSIS — L03116 Cellulitis of left lower limb: Secondary | ICD-10-CM | POA: Diagnosis not present

## 2015-05-02 HISTORY — DX: Chronic kidney disease, stage 3 (moderate): N18.3

## 2015-05-02 HISTORY — DX: Chronic kidney disease, stage 3 unspecified: N18.30

## 2015-05-02 HISTORY — DX: Cutaneous abscess of limb, unspecified: L02.419

## 2015-05-02 HISTORY — DX: Cellulitis of unspecified part of limb: L03.119

## 2015-05-02 LAB — CBC WITH DIFFERENTIAL/PLATELET
BASOS ABS: 0 10*3/uL (ref 0.0–0.1)
Basophils Relative: 0 % (ref 0–1)
Eosinophils Absolute: 0.2 10*3/uL (ref 0.0–0.7)
Eosinophils Relative: 1 % (ref 0–5)
HCT: 34.4 % — ABNORMAL LOW (ref 36.0–46.0)
Hemoglobin: 10.9 g/dL — ABNORMAL LOW (ref 12.0–15.0)
LYMPHS PCT: 9 % — AB (ref 12–46)
Lymphs Abs: 1.5 10*3/uL (ref 0.7–4.0)
MCH: 29.3 pg (ref 26.0–34.0)
MCHC: 31.7 g/dL (ref 30.0–36.0)
MCV: 92.5 fL (ref 78.0–100.0)
Monocytes Absolute: 1.2 10*3/uL — ABNORMAL HIGH (ref 0.1–1.0)
Monocytes Relative: 7 % (ref 3–12)
NEUTROS ABS: 13.4 10*3/uL — AB (ref 1.7–7.7)
Neutrophils Relative %: 83 % — ABNORMAL HIGH (ref 43–77)
PLATELETS: 179 10*3/uL (ref 150–400)
RBC: 3.72 MIL/uL — AB (ref 3.87–5.11)
RDW: 17.3 % — ABNORMAL HIGH (ref 11.5–15.5)
WBC: 16.3 10*3/uL — AB (ref 4.0–10.5)

## 2015-05-02 LAB — BASIC METABOLIC PANEL
Anion gap: 14 (ref 5–15)
BUN: 30 mg/dL — AB (ref 6–20)
CALCIUM: 9.8 mg/dL (ref 8.9–10.3)
CHLORIDE: 97 mmol/L — AB (ref 101–111)
CO2: 23 mmol/L (ref 22–32)
CREATININE: 1.73 mg/dL — AB (ref 0.44–1.00)
GFR calc Af Amer: 33 mL/min — ABNORMAL LOW (ref >60)
GFR, EST NON AFRICAN AMERICAN: 29 mL/min — AB (ref >60)
GLUCOSE: 122 mg/dL — AB (ref 65–99)
POTASSIUM: 4.6 mmol/L (ref 3.5–5.1)
Sodium: 134 mmol/L — ABNORMAL LOW (ref 135–145)

## 2015-05-02 NOTE — ED Notes (Signed)
Pt c/o of redness, swelling and pain in left lower leg increasing x2 days. Was seen by PCP 2 days ago and started on penicillin. Denies any injury to leg. Left lower leg is warm and warm to touch. Pedal pulses +2 equal bilaterally, sensation intact.

## 2015-05-02 NOTE — ED Provider Notes (Signed)
CSN: [id number]     Arrival date & time 05/02/15  2147 History   First MD Initiated Contact with Patient 05/02/15 2147     Chief Complaint  Patient presents with  . Leg Swelling     (Consider location/radiation/quality/duration/timing/severity/associated sxs/prior Treatment) HPI [name] is a 74 y.o. female who comes in for evaluation of potential leg infection. Patient states last Friday she began to experience redness, swelling and tenderness to her left ankle that proceeded to get worse over the weekend. She reports seeing her PCP on Monday for this problem, was prescribed penicillin, but her symptoms worsened so she came to the ED for evaluation today. She reports associated fevers at home today as high as 104.5. She took Tylenol and this seemed to resolve the fever. She denies any nausea or vomiting, shortness of breath, insect bites, injuries, new perfumes or clothing. Pain is worsened with palpation and better with rest. No other aggravating or modifying factors.  Past Medical History  Diagnosis Date  . Pulmonary hypertension     s/p RHC 4/10 - PAP 86/29, wedge 21-23  . Gastric ulcer with hemorrhage   . Rheumatic fever     Childhood  . Coronary artery disease     Nonobstructive, LVEF 55%  . Mitral regurgitation     a. s/p bioprosthetic MVR  . Tricuspid regurgitation     a. s/p TV repair  . Hypertension   . Stroke     a. 10/2010 with residual L sided weakness  . Asthma     PT. ON OXYGEN 2 L/[place]   . Diabetes mellitus   . Hypothyroidism   . Anemia, pernicious 1979  . Hepatitis     PT. STATES SHE HAD IT WHEN SHE WAS 14 YRS. OLD  . Arthritis   . GERD (gastroesophageal reflux disease)   . Fibromyalgia   . Squamous cell carcinoma     Left arm  . Breast cancer     2008 s/p chemo and radiation (last tx 4/09) s/p left mastectomy s/p 9 lymph node resection - [name], [place] [facility]  . Atrial fibrillation     a. s/p MAZE  . 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 AICD (automatic cardioverter/defibrillator) present    Past Surgical History  Procedure Laterality Date  . Partial gastrectomy    . Tonsillectomy    . Mastectomy  2008    LEFT  . Maze  09/24/2011    Procedure: MAZE (complete biatrial lesion set using RF and cryo with clipping of LA appendage);  Surgeon: Rexene Alberts, MD;  Location: [place];  Service: Open Heart Surgery;  Laterality: N/A;  . Mitral valve replacement  09/24/2011    Procedure: MITRAL VALVE (MV) REPLACEMENT (#54mm Medtronic Mosaic bioprosthetic tissue valve);  Surgeon: Rexene Alberts, MD;  Location: [place];  Service: Open Heart Surgery;  Laterality: N/A;  . Tricuspid valvuloplasty  09/24/2011    #39mm Edwards mc3 ring annuloplasty  . Left and right heart catheterization with coronary angiogram N/A 03/30/2013    Procedure: LEFT AND RIGHT HEART CATHETERIZATION WITH CORONARY ANGIOGRAM;  Surgeon: Peter M Martinique, MD;  Location: [facility] CATH LAB;  Service: Cardiovascular;  Laterality: N/A;  . Implantable cardioverter defibrillator implant N/A 03/31/2013    MDT Evalyn Casco DR implanted by Dr Lovena Le for secondary prevention   Family History  Problem Relation Age of Onset  . Heart disease Mother   . Colon cancer Mother    History  Substance Use Topics  . Smoking status: Never Smoker   . Smokeless tobacco: Never Used  . Alcohol Use: No   OB History    No data available     Review of Systems A 10 point review of systems was completed and was negative except for pertinent positives and negatives as mentioned in the history of present illness     Allergies  Codeine; Butorphanol tartrate; Keflex; Levofloxacin; Lipitor; Revatio; and Zofran  Home Medications   Prior to Admission medications   Medication Sig Start Date End Date Taking? Authorizing Provider  albuterol (PROVENTIL HFA;VENTOLIN HFA) 108 (90 BASE) MCG/ACT inhaler Inhale 1 puff into the lungs every 6 (six) hours as needed for wheezing or  shortness of breath.    Historical Provider, MD  amoxicillin-clavulanate (AUGMENTIN) 500-125 MG per tablet Take 1 tablet (500 mg total) by mouth 2 (two) times daily. 04/07/15   Nita Sells, MD  aspirin EC 81 MG tablet Take 81 mg by mouth every evening.    Historical Provider, MD  cholecalciferol (VITAMIN D) 1000 UNITS tablet Take 1,000 Units by mouth daily.    Historical Provider, MD  cyanocobalamin (,VITAMIN B-12,) 1000 MCG/ML injection Inject 1,000 mcg into the muscle every 30 (thirty) days.    Historical Provider, MD  ferrous sulfate 325 (65 FE) MG tablet Take 1 tablet (325 mg total) by mouth daily with breakfast. 04/07/15   Nita Sells, MD  folic acid (FOLVITE) 1 MG tablet Take 2 mg by mouth daily.     Historical Provider, MD  hydrocodone-acetaminophen (LORCET PLUS) 7.5-650 MG per tablet Take 1 tablet by mouth every 4 (four) hours as needed for pain. PRN Arthritis 02/04/01   Historical Provider, MD  levothyroxine (SYNTHROID, LEVOTHROID) 125 MCG tablet Take 125 mcg by mouth daily before breakfast.    Historical Provider, MD  lisinopril (PRINIVIL,ZESTRIL) 5 MG tablet Take 2.5 mg by mouth daily.    Historical Provider, MD  LORazepam (ATIVAN) 1 MG tablet Take 1 mg by mouth at bedtime. 03/13/15   Historical Provider, MD  magnesium oxide (MAG-OX) 400 (241.3 MG) MG tablet Take 400 mg by mouth daily. 03/28/15   Historical Provider, MD  Melatonin 5 MG TABS Take 10 mg by mouth at bedtime.    Historical Provider, MD  metoprolol tartrate (LOPRESSOR) 25 MG tablet Take 25 mg by mouth 2 (two) times daily.    Historical Provider, MD  mirtazapine (REMERON) 15 MG tablet Take 15 mg by mouth at bedtime.  06/19/13   Historical Provider, MD  mupirocin cream (BACTROBAN) 2 % Apply 1 application topically 3 (three) times daily.  02/28/15   Historical Provider, MD  nitroGLYCERIN (NITROSTAT) 0.4 MG SL tablet Place 0.4 mg under the tongue every 5 (five) minutes as needed for chest pain.    Historical Provider, MD   ondansetron (ZOFRAN) 4 MG tablet Take 4 mg by mouth 4 (four) times daily as needed for nausea or vomiting. For nausea 03/19/15   Historical Provider, MD  oxyCODONE-acetaminophen (PERCOCET) 7.5-325 MG per tablet Take 1 tablet by mouth every 4 (four) hours as needed for moderate pain.  03/05/15   Historical Provider, MD  potassium chloride SA (K-DUR,KLOR-CON) 20 MEQ tablet Take 1 tablet (20 mEq total) by mouth 2 (two) times daily. 04/07/15   Nita Sells, MD  pramipexole (MIRAPEX) 0.25 MG tablet Take 0.25 mg by mouth. Take 1 tablet by mouth at 4 PM and 1 at bedtime. 01/23/15   Historical Provider, MD  rosuvastatin (CRESTOR) 20 MG tablet  Take 1 tablet (20 mg total) by mouth daily at 6 PM. 04/07/15   Nita Sells, MD  spironolactone (ALDACTONE) 25 MG tablet Take 0.5 tablets (12.5 mg total) by mouth daily. 04/07/15   Nita Sells, MD  temazepam (RESTORIL) 30 MG capsule Take 30 mg by mouth at bedtime.     Historical Provider, MD  torsemide (DEMADEX) 20 MG tablet Take 1 tablet (20 mg total) by mouth 2 (two) times daily. 04/07/15   Nita Sells, MD   BP 135/47 mmHg  Pulse 71  Temp(Src) 99.1 F (37.3 C) (Oral)  Resp 16  Ht 5\' 1"  (1.549 m)  Wt 144 lb (65.318 kg)  BMI 27.22 kg/m2  SpO2 99% Physical Exam  Constitutional: She is oriented to person, place, and time. She appears well-developed and well-nourished.  HENT:  Head: Normocephalic and atraumatic.  Mouth/Throat: Oropharynx is clear and moist.  Eyes: Conjunctivae are normal. Pupils are equal, round, and reactive to light. Right eye exhibits no discharge. Left eye exhibits no discharge. No scleral icterus.  Neck: Neck supple.  Cardiovascular: Normal rate and regular rhythm.   S1 murmur  Pulmonary/Chest: Effort normal and breath sounds normal. No respiratory distress. She has no wheezes. She has no rales.  Abdominal: Soft. There is no tenderness.  Musculoskeletal: She exhibits no tenderness.  Neurological: She is alert and  oriented to person, place, and time.  Cranial Nerves II-XII grossly intact  Skin: Skin is warm and dry. No rash noted.  Diffuse erythema and swelling throughout left anterior medial tibia. No tenderness or varicosities noted in deep venous system.  Psychiatric: She has a normal mood and affect.  Nursing note and vitals reviewed.   ED Course  Procedures (including critical care time) Labs Review Labs Reviewed  CBC WITH DIFFERENTIAL/PLATELET  BASIC METABOLIC PANEL    Imaging Review No results found.   EKG Interpretation None     Meds given in ED:  Medications  HYDROcodone-acetaminophen (NORCO/VICODIN) 5-325 MG per tablet 1-2 tablet (not administered)    New Prescriptions   No medications on file   Filed Vitals:   05/02/15 2200 05/02/15 2215 05/02/15 2230 05/02/15 2245  BP: 137/59 131/66 128/55 136/50  Pulse: 71 69 70 70  Temp:      TempSrc:      Resp:  19 20 23   Height:      Weight:      SpO2: 100% 98% 98% 98%    MDM  Patient is a 74 year old female with history of diabetes he presents today for evaluation of left leg swelling and redness. Patient's exam is consistent with cellulitis. No evidence of DVT or other vascular compromise. Patient received outpatient therapy penicillin, but symptoms are worsening. She has a leukocytosis of 16.3. Vital signs remained stable. She does not have access to care at her home. Prefers admission for IV antibiotic. Patient started on IV vancomycin in the ED. Consult to internal medicine, patient admitted. Prior to patient admission, I discussed and reviewed this case with my attending, Dr. Vanita Panda who also saw and evaluated the patient. Final diagnoses:  Cellulitis of left lower extremity        Comer Locket, PA-C 05/03/15 0006  Carmin Muskrat, MD 05/03/15 [phone]

## 2015-05-02 NOTE — H&P (Signed)
[facility] History and Physical  MEEAH TOTINO GDJ:[id number] DOB: 1941-02-09 DOA: 05/02/2015  Referring physician: ED physician PCP: Burton Apley, MD  Specialists:   Chief Complaint: left leg pain, swelling and redness  HPI: [name] is a 74 y.o. female with PMH of chronic combined systolic and diastolic heart failure, hypothyroidism, anemia, GERD, breast cancer (status post of L mastectomy, chemotherapy and radiation), pulmonary hypertension, PUD, GI bleed, coronary artery disease, mitral valve replacement, tricuspid valve valulopasty, s/p of Maze procedure,  AICD placement, stroke, asthma, pernicious anemia, who presents with leg pain, redness and swelling.  Patient was recently hospitalized twice due to cellulitis. Recent admission was from 5/24 to 5/27 due to bilateral leg cellulitis and sepsis. Patient was treated with IV vancomycin and Zosyn, and discharged on Augmentin. She completed Augmentin on 04/01/15. She has been doing okay until yesterday when she started having worsening left leg pain, redness and swelling. She had  temperature 104.5 at home. She was evaluated by PCP and started with antibodies (possible Augmentin) again, but without any help. She still has severe pain in left lower leg. She reports that she had to episode of chest pain in the morning and afternoon, which lasted for a few minutes, then resolved spontaneously. Currently no any chest pain. Patient does not have abdominal pain, diarrhea, any symptoms for UTI.  In ED, patient was found to have WBC 16.3, temperature 99.1, tachycardia, stable renal function. Patient is admitted to inpatient for further evaluation and treatment.  Where does patient live?   At home   Can patient participate in ADLs? Little    Review of Systems:   General: has fevers, chills, no changes in body weight, has poor appetite, has fatigue HEENT: no blurry vision, hearing changes or sore throat Pulm: has dyspnea, no coughing,  wheezing CV: had chest pain, no palpitations Abd: no nausea, vomiting, abdominal pain, diarrhea, constipation GU: no dysuria, burning on urination, increased urinary frequency, hematuria  Ext: has bilateral leg edema and left leg pain Neuro: no unilateral weakness, numbness, or tingling, no vision change or hearing loss Skin: no rash MSK: No muscle spasm, no deformity, no limitation of range of movement in spin Heme: No easy bruising.  Travel history: No recent long distant travel.  Allergy:  Allergies  Allergen Reactions  . Butorphanol Tartrate Other (See Comments)    REACTION: migraines, nervous/agitated  . Levofloxacin Other (See Comments)    Avoid QTc prolonging medications  . Zofran [Ondansetron Hcl] Other (See Comments)    Avoid QTc prolonging medications  . Codeine Nausea And Vomiting  . Lipitor [Atorvastatin Calcium] Diarrhea and Other (See Comments)    nightmares  . Revatio [Sildenafil Citrate] Swelling  . Keflex [Cephalexin] Other (See Comments)    Listed as allergy at [facility], but tolerated cefepime here.    Past Medical History  Diagnosis Date  . Pulmonary hypertension     s/p RHC 4/10 - PAP 86/29, wedge 21-23  . Gastric ulcer with hemorrhage   . Rheumatic fever     Childhood  . Coronary artery disease     Nonobstructive, LVEF 55%  . Mitral regurgitation     a. s/p bioprosthetic MVR  . Tricuspid regurgitation     a. s/p TV repair  . Hypertension   . Stroke     a. 10/2010 with residual L sided weakness  . Asthma     PT. ON OXYGEN 2 L/[place]   . Diabetes mellitus   . Hypothyroidism   . Anemia,  pernicious 1979  . Hepatitis     PT. STATES SHE HAD IT WHEN SHE WAS 14 YRS. OLD  . Arthritis   . GERD (gastroesophageal reflux disease)   . Fibromyalgia   . Squamous cell carcinoma     Left arm  . Breast cancer     2008 s/p chemo and radiation (last tx 4/09) s/p left mastectomy s/p 9 lymph node resection - [name], [place] [facility]  . Atrial fibrillation      a. s/p MAZE  . 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 AICD (automatic cardioverter/defibrillator) present   . CKD (chronic kidney disease), stage III     Past Surgical History  Procedure Laterality Date  . Partial gastrectomy    . Tonsillectomy    . Mastectomy  2008    LEFT  . Maze  09/24/2011    Procedure: MAZE (complete biatrial lesion set using RF and cryo with clipping of LA appendage);  Surgeon: Rexene Alberts, MD;  Location: [place];  Service: Open Heart Surgery;  Laterality: N/A;  . Mitral valve replacement  09/24/2011    Procedure: MITRAL VALVE (MV) REPLACEMENT (#35mm Medtronic Mosaic bioprosthetic tissue valve);  Surgeon: Rexene Alberts, MD;  Location: [place];  Service: Open Heart Surgery;  Laterality: N/A;  . Tricuspid valvuloplasty  09/24/2011    #58mm Edwards mc3 ring annuloplasty  . Left and right heart catheterization with coronary angiogram N/A 03/30/2013    Procedure: LEFT AND RIGHT HEART CATHETERIZATION WITH CORONARY ANGIOGRAM;  Surgeon: Peter M Martinique, MD;  Location[facility] C[facility];  Service: Cardiovascular;  Laterality: N/A;  . Implantable cardioverter defibrillator implant N/A 03/31/2013    MDT Evalyn Casco DR implanted by Dr Lovena Le for secondary prevention    Social History:  reports that she has never smoked. She has never used smokeless tobacco. She reports that she does not drink alcohol or use illicit drugs.  Family History:  Family History  Problem Relation Age of Onset  . Heart disease Mother   . Colon cancer Mother      Prior to Admission medications   Medication Sig Start Date End Date Taking? Authorizing Provider  albuterol (PROVENTIL HFA;VENTOLIN HFA) 108 (90 BASE) MCG/ACT inhaler Inhale 1 puff into the lungs every 6 (six) hours as needed for wheezing or shortness of breath.   Yes Historical Provider, MD  amoxicillin-clavulanate (AUGMENTIN) 500-125 MG per tablet Take 1 tablet (500 mg total) by mouth 2 (two) times  daily. Patient taking differently: Take 1 tablet by mouth 2 (two) times daily. Started 05/01/15, for 10 days ending 05/10/15 04/07/15  Yes Nita Sells, MD  aspirin EC 81 MG tablet Take 81 mg by mouth every evening.   Yes Historical Provider, MD  cholecalciferol (VITAMIN D) 1000 UNITS tablet Take 1,000 Units by mouth daily.   Yes Historical Provider, MD  cyanocobalamin (,VITAMIN B-12,) 1000 MCG/ML injection Inject 1,000 mcg into the muscle every 30 (thirty) days. Takes on 23rd or 24th each month   Yes Historical Provider, MD  folic acid (FOLVITE) 1 MG tablet Take 2 mg by mouth daily.    Yes Historical Provider, MD  levothyroxine (SYNTHROID, LEVOTHROID) 125 MCG tablet Take 125 mcg by mouth daily before breakfast.   Yes Historical Provider, MD  lisinopril (PRINIVIL,ZESTRIL) 5 MG tablet Take 2.5 mg by mouth daily.   Yes Historical Provider, MD  LORazepam (ATIVAN) 1 MG tablet Take 2 mg by mouth at bedtime.  03/13/15  Yes Historical Provider, MD  magnesium oxide (MAG-OX) 400 (241.3 MG) MG tablet Take 400 mg by mouth daily. 03/28/15  Yes Historical Provider, MD  Melatonin 5 MG TABS Take 10 mg by mouth at bedtime.   Yes Historical Provider, MD  metoprolol tartrate (LOPRESSOR) 25 MG tablet Take 25 mg by mouth 2 (two) times daily.   Yes Historical Provider, MD  mirtazapine (REMERON) 15 MG tablet Take 15 mg by mouth at bedtime.  06/19/13  Yes Historical Provider, MD  nitroGLYCERIN (NITROSTAT) 0.4 MG SL tablet Place 0.4 mg under the tongue every 5 (five) minutes as needed for chest pain.   Yes Historical Provider, MD  oxyCODONE-acetaminophen (PERCOCET) 7.5-325 MG per tablet Take 1 tablet by mouth every 4 (four) hours as needed for moderate pain.  03/05/15  Yes Historical Provider, MD  pantoprazole (PROTONIX) 40 MG tablet Take 40 mg by mouth daily as needed (for heartburn).   Yes Historical Provider, MD  potassium chloride SA (K-DUR,KLOR-CON) 20 MEQ tablet Take 1 tablet (20 mEq total) by mouth 2 (two) times  daily. Patient taking differently: Take 40 mEq by mouth 2 (two) times daily.  04/07/15  Yes Nita Sells, MD  pramipexole (MIRAPEX) 0.25 MG tablet Take 0.25 mg by mouth. Take 1 tablet by mouth at 4 PM and 1 at bedtime. 01/23/15  Yes Historical Provider, MD  rosuvastatin (CRESTOR) 20 MG tablet Take 1 tablet (20 mg total) by mouth daily at 6 PM. 04/07/15  Yes Nita Sells, MD  temazepam (RESTORIL) 30 MG capsule Take 30 mg by mouth at bedtime.    Yes Historical Provider, MD  torsemide (DEMADEX) 20 MG tablet Take 1 tablet (20 mg total) by mouth 2 (two) times daily. Patient taking differently: Take 40 mg by mouth 2 (two) times daily.  04/07/15  Yes Nita Sells, MD  ferrous sulfate 325 (65 FE) MG tablet Take 1 tablet (325 mg total) by mouth daily with breakfast. 04/07/15   Nita Sells, MD  hydrocodone-acetaminophen (LORCET PLUS) 7.5-650 MG per tablet Take 1 tablet by mouth every 4 (four) hours as needed for pain. PRN Arthritis 02/04/01   Historical Provider, MD  spironolactone (ALDACTONE) 25 MG tablet Take 0.5 tablets (12.5 mg total) by mouth daily. 04/07/15   Nita Sells, MD    Physical Exam: Filed Vitals:   05/03/15 0004 05/03/15 0015 05/03/15 0030 05/03/15 0045  BP: 120/43 133/60 118/47 122/52  Pulse: 82  81 79  Temp:      TempSrc:      Resp: 24 24 19 17   Height:      Weight:      SpO2: 95% 100% 100% 100%   General: Not in acute distress HEENT:       Eyes: PERRL, EOMI, no scleral icterus.       ENT: No discharge from the ears and nose, no pharynx injection, no tonsillar enlargement.        Neck: No JVD, no bruit, no mass felt. Heme: No neck lymph node enlargement. Cardiac: faint S1/S2, RRR, low pitched rumbling heart murmur throughout the precordium, No rubs. Pulm: No rales, wheezing, rhonchi or rubs. Abd: Soft, nondistended, nontender, no rebound pain, no organomegaly, BS present. Ext: trace leg pitting leg edema on the right side. There is redness,  warmth and tenderness over left leg, from ankle to the level below knee, 1+ DP/PT pulse bilaterally. Musculoskeletal: No joint deformities, No joint redness or warmth, no limitation of ROM in spin. Skin: No rashes.  Neuro: Alert, oriented X3, cranial nerves II-XII grossly intact,  muscle strength 5/5 in all extremities, sensation to light touch intact.  Psych: Patient is not psychotic, no suicidal or hemocidal ideation.  Labs on Admission:  Basic Metabolic Panel:  Recent Labs Lab 05/02/15 2310  NA 134*  K 4.6  CL 97*  CO2 23  GLUCOSE 122*  BUN 30*  CREATININE 1.73*  CALCIUM 9.8   Liver Function Tests: No results for input(s): AST, ALT, ALKPHOS, BILITOT, PROT, ALBUMIN in the last 168 hours. No results for input(s): LIPASE, AMYLASE in the last 168 hours. No results for input(s): AMMONIA in the last 168 hours. CBC:  Recent Labs Lab 05/02/15 2310  WBC 16.3*  NEUTROABS 13.4*  HGB 10.9*  HCT 34.4*  MCV 92.5  PLT 179   Cardiac Enzymes: No results for input(s): CKTOTAL, CKMB, CKMBINDEX, TROPONINI in the last 168 hours.  BNP (last 3 results)  Recent Labs  03/08/15 2333 03/28/15 1600  BNP 698.3* 220.7*    ProBNP (last 3 results) No results for input(s): PROBNP in the last 8760 hours.  CBG: No results for input(s): GLUCAP in the last 168 hours.  Radiological Exams on Admission: No results found.  EKG:  Not done in ED, will get one.   Assessment/Plan Principal Problem:   Cellulitis of left leg Active Problems:   CORONARY ATHEROSCLEROSIS NATIVE CORONARY ARTERY   Pulmonary hypertension   Stroke   S/P tricuspid valve repair   S/P Maze operation for atrial fibrillation   Atrial fibrillation   S/P mitral valve replacement   Chronic combined systolic and diastolic heart failure   Torsades de pointes   Cardiac arrest   Long Q-T syndrome   Sepsis   ICD (implantable cardioverter-defibrillator) discharge   History of drug-induced prolonged QT interval with  torsade de pointes   CKD (chronic kidney disease), stage III  Left leg extremity cellulitis and sepsis: Patient has a recurrent leg cellulitis, recent sepsis on admission (leukocytosis of the fever). She is hemodynamically stable currently. Also note, patient developed prolonged QTc interval in previous admission. Will check her EKG today. If has QTC prolongation, please do not prescribe fluoroquinolone.  -  will admit to tele bed -  IV vancomycin and add zosyn  -  Wiill get Procalcitonin and trend lactic acid levels per sepsis protocol. -  IVF: Judicious use of IV fluids, 50 cc/h (patient has pulmonary hypertension and chronic systolic and diastolic heart failure, which limit aggressive IV fluid treatment) -  Follow-up blood cultures -  Norco prn for pain  Chronic systolic and diastolic CHF and pulmonary hypertension: ECHO from 03/09/2015 demonstrated an ejection fraction of 55-60% (previous ejection fraction of 17%) with diastolic dysfunction. Pulmonary arterial pressures of 86/29 and wedge pressures of 21-23 on right heart cath from 4/10. Recent ECHO with peak PA pressure of 63 mmHg. CHF seems compensated on admission. - hold diuretics due to sepsis, Torsemide and spironolactone - watch volume status closely - check BNP - May consult to Heart failure team in am - aspirin, Toprol, lisinopril  CAD/hypertension: had two episodes of chest pain at rest today, but no chest pain or SOB currentlyu -  Telemetry -  Continue aspirin, Toprol, lisinopril, when necessary nitroglycerin  HLD: No LDL on record -Continue home medications: Crestor -Check FLP  Atrial Fibrillation with hx of VF/T arrest in 2014 : CHA2DS2-VASc Score is 7-8 , needs oral anticoagulation. Patient is not on anticoagulants due to hx of PUD and GIB. Heart rate is well controlled. - Status post AICD placement - Avoid QTc  prolonging medications - metoprol  Chronic kidney disease stage 3:  Baseline creatinine is 1.4 -1.8. Her  reatinine 1.75 which is at baseline.    -  follow up renal fx by CMP -  Renally dose medications  Diabetes mellitus type 2 in remission, not on home medications, diet controlled. A1c=6.8 on 04/04/15 -  Daily CBG and start SSI if persistently elevated > 140  Pernicious anemia and iron deficiency anemia -  Continue vitamin B12 and iron supplementation  Hypothyroidism: TSH 1.879 on 03/08/15  -continue thyroid supplementation -check TSH (per discharge summary, patient is due to have TSH checked)  History of stroke: -continue ASA  GERD: -Protonix  Depression and anxiety: Stable, no suicidal or homicidal ideations. -Continue home medications: Pramipexole, Ativan, mirtazapine                 DVT ppx: SQ Heparin       Code Status: Full code Family Communication: None at bed side.   Disposition Plan: Admit to inpatient   Date of Service 05/03/2015    Ivor Costa [facility] Pager [phone]  If 7PM-7AM, please contact night-coverage [email or web address] Password [email or web address] 05/03/2015, 1:06 AM

## 2015-05-03 ENCOUNTER — Encounter (HOSPITAL_COMMUNITY): Payer: Self-pay | Admitting: General Practice

## 2015-05-03 DIAGNOSIS — I5042 Chronic combined systolic (congestive) and diastolic (congestive) heart failure: Secondary | ICD-10-CM

## 2015-05-03 DIAGNOSIS — I270 Primary pulmonary hypertension: Secondary | ICD-10-CM

## 2015-05-03 DIAGNOSIS — N183 Chronic kidney disease, stage 3 (moderate): Secondary | ICD-10-CM

## 2015-05-03 DIAGNOSIS — L03116 Cellulitis of left lower limb: Secondary | ICD-10-CM | POA: Insufficient documentation

## 2015-05-03 LAB — COMPREHENSIVE METABOLIC PANEL
ALBUMIN: 2.9 g/dL — AB (ref 3.5–5.0)
ALK PHOS: 78 U/L (ref 38–126)
ALT: 9 U/L — AB (ref 14–54)
AST: 24 U/L (ref 15–41)
Anion gap: 11 (ref 5–15)
BUN: 28 mg/dL — ABNORMAL HIGH (ref 6–20)
CALCIUM: 9 mg/dL (ref 8.9–10.3)
CO2: 22 mmol/L (ref 22–32)
Chloride: 102 mmol/L (ref 101–111)
Creatinine, Ser: 1.78 mg/dL — ABNORMAL HIGH (ref 0.44–1.00)
GFR calc non Af Amer: 28 mL/min — ABNORMAL LOW (ref >60)
GFR, EST AFRICAN AMERICAN: 32 mL/min — AB (ref >60)
Glucose, Bld: 110 mg/dL — ABNORMAL HIGH (ref 65–99)
POTASSIUM: 4.2 mmol/L (ref 3.5–5.1)
Sodium: 135 mmol/L (ref 135–145)
Total Bilirubin: 1.1 mg/dL (ref 0.3–1.2)
Total Protein: 7.2 g/dL (ref 6.5–8.1)

## 2015-05-03 LAB — CBC
HEMATOCRIT: 29.5 % — AB (ref 36.0–46.0)
HEMOGLOBIN: 9.4 g/dL — AB (ref 12.0–15.0)
MCH: 29.2 pg (ref 26.0–34.0)
MCHC: 31.9 g/dL (ref 30.0–36.0)
MCV: 91.6 fL (ref 78.0–100.0)
Platelets: 157 10*3/uL (ref 150–400)
RBC: 3.22 MIL/uL — AB (ref 3.87–5.11)
RDW: 17.3 % — ABNORMAL HIGH (ref 11.5–15.5)
WBC: 12.2 10*3/uL — ABNORMAL HIGH (ref 4.0–10.5)

## 2015-05-03 LAB — GLUCOSE, CAPILLARY: Glucose-Capillary: 99 mg/dL (ref 65–99)

## 2015-05-03 LAB — LACTIC ACID, PLASMA
LACTIC ACID, VENOUS: 1.3 mmol/L (ref 0.5–2.0)
Lactic Acid, Venous: 1 mmol/L (ref 0.5–2.0)

## 2015-05-03 LAB — TSH: TSH: 0.532 u[IU]/mL (ref 0.350–4.500)

## 2015-05-03 LAB — PROTIME-INR
INR: 1.3 (ref 0.00–1.49)
Prothrombin Time: 16.3 seconds — ABNORMAL HIGH (ref 11.6–15.2)

## 2015-05-03 LAB — LIPID PANEL
CHOL/HDL RATIO: 4.9 ratio
CHOLESTEROL: 103 mg/dL (ref 0–200)
HDL: 21 mg/dL — AB (ref >40)
LDL CALC: 69 mg/dL (ref 0–99)
Triglycerides: 67 mg/dL (ref <150)
VLDL: 13 mg/dL (ref 0–40)

## 2015-05-03 LAB — PROCALCITONIN: PROCALCITONIN: 0.14 ng/mL

## 2015-05-03 LAB — APTT: aPTT: 29 seconds (ref 24–37)

## 2015-05-03 LAB — BRAIN NATRIURETIC PEPTIDE: B NATRIURETIC PEPTIDE 5: 191.9 pg/mL — AB (ref 0.0–100.0)

## 2015-05-03 MED ORDER — LEVOTHYROXINE SODIUM 125 MCG PO TABS
125.0000 ug | ORAL_TABLET | Freq: Every day | ORAL | Status: DC
  Administered 2015-05-03 – 2015-05-05 (×3): 125 ug via ORAL
  Filled 2015-05-03 (×4): qty 1

## 2015-05-03 MED ORDER — ALUM & MAG HYDROXIDE-SIMETH 200-200-20 MG/5ML PO SUSP: 30.0000 mL | Freq: Four times a day (QID) | ORAL | Status: DC | PRN

## 2015-05-03 MED ORDER — NITROGLYCERIN 0.4 MG SL SUBL: 0.4000 mg | SUBLINGUAL_TABLET | SUBLINGUAL | Status: DC | PRN

## 2015-05-03 MED ORDER — SODIUM CHLORIDE 0.9 % IV SOLN
500.0000 mg | INTRAVENOUS | Status: DC
  Administered 2015-05-04 (×2): 500 mg via INTRAVENOUS
  Filled 2015-05-03 (×3): qty 500

## 2015-05-03 MED ORDER — CYANOCOBALAMIN 1000 MCG/ML IJ SOLN
1000.0000 ug | INTRAMUSCULAR | Status: DC
  Administered 2015-05-05: 1000 ug via INTRAMUSCULAR
  Filled 2015-05-03 (×2): qty 1

## 2015-05-03 MED ORDER — TEMAZEPAM 15 MG PO CAPS
30.0000 mg | ORAL_CAPSULE | Freq: Every day | ORAL | Status: DC
  Administered 2015-05-03 – 2015-05-04 (×3): 30 mg via ORAL
  Filled 2015-05-03 (×3): qty 2

## 2015-05-03 MED ORDER — LISINOPRIL 2.5 MG PO TABS
2.5000 mg | ORAL_TABLET | Freq: Every day | ORAL | Status: DC
  Administered 2015-05-03 – 2015-05-05 (×3): 2.5 mg via ORAL
  Filled 2015-05-03 (×3): qty 1

## 2015-05-03 MED ORDER — PRAMIPEXOLE DIHYDROCHLORIDE 0.25 MG PO TABS
0.2500 mg | ORAL_TABLET | ORAL | Status: DC
  Administered 2015-05-03 – 2015-05-04 (×5): 0.25 mg via ORAL
  Filled 2015-05-03 (×7): qty 1

## 2015-05-03 MED ORDER — MAGNESIUM OXIDE 400 (241.3 MG) MG PO TABS
400.0000 mg | ORAL_TABLET | Freq: Every day | ORAL | Status: DC
  Administered 2015-05-03 – 2015-05-05 (×3): 400 mg via ORAL
  Filled 2015-05-03 (×3): qty 1

## 2015-05-03 MED ORDER — HYDROXYZINE HCL 50 MG/ML IM SOLN
25.0000 mg | Freq: Four times a day (QID) | INTRAMUSCULAR | Status: DC | PRN
  Filled 2015-05-03: qty 0.5

## 2015-05-03 MED ORDER — ACETAMINOPHEN 325 MG PO TABS: 650.0000 mg | ORAL_TABLET | Freq: Four times a day (QID) | ORAL | Status: DC | PRN

## 2015-05-03 MED ORDER — ROSUVASTATIN CALCIUM 20 MG PO TABS
20.0000 mg | ORAL_TABLET | Freq: Every day | ORAL | Status: DC
  Administered 2015-05-03 – 2015-05-04 (×2): 20 mg via ORAL
  Filled 2015-05-03 (×3): qty 1

## 2015-05-03 MED ORDER — HEPARIN SODIUM (PORCINE) 5000 UNIT/ML IJ SOLN
5000.0000 [IU] | Freq: Three times a day (TID) | INTRAMUSCULAR | Status: DC
  Administered 2015-05-03 – 2015-05-05 (×7): 5000 [IU] via SUBCUTANEOUS
  Filled 2015-05-03 (×10): qty 1

## 2015-05-03 MED ORDER — ALBUTEROL SULFATE (2.5 MG/3ML) 0.083% IN NEBU: 3.0000 mL | INHALATION_SOLUTION | Freq: Four times a day (QID) | RESPIRATORY_TRACT | Status: DC | PRN

## 2015-05-03 MED ORDER — ACETAMINOPHEN 650 MG RE SUPP: 650.0000 mg | Freq: Four times a day (QID) | RECTAL | Status: DC | PRN

## 2015-05-03 MED ORDER — PIPERACILLIN-TAZOBACTAM 3.375 G IVPB
3.3750 g | Freq: Three times a day (TID) | INTRAVENOUS | Status: DC
  Administered 2015-05-03 – 2015-05-05 (×7): 3.375 g via INTRAVENOUS
  Filled 2015-05-03 (×8): qty 50

## 2015-05-03 MED ORDER — VANCOMYCIN HCL IN DEXTROSE 1-5 GM/200ML-% IV SOLN
1000.0000 mg | Freq: Once | INTRAVENOUS | Status: AC
  Administered 2015-05-03: 1000 mg via INTRAVENOUS
  Filled 2015-05-03: qty 200

## 2015-05-03 MED ORDER — FOLIC ACID 1 MG PO TABS
2.0000 mg | ORAL_TABLET | Freq: Every day | ORAL | Status: DC
  Administered 2015-05-03 – 2015-05-05 (×3): 2 mg via ORAL
  Filled 2015-05-03 (×3): qty 2

## 2015-05-03 MED ORDER — SODIUM CHLORIDE 0.9 % IJ SOLN
3.0000 mL | Freq: Two times a day (BID) | INTRAMUSCULAR | Status: DC
  Administered 2015-05-03 – 2015-05-05 (×4): 3 mL via INTRAVENOUS

## 2015-05-03 MED ORDER — OXYCODONE-ACETAMINOPHEN 7.5-325 MG PO TABS: 1.0000 | ORAL_TABLET | Freq: Four times a day (QID) | ORAL | Status: DC | PRN

## 2015-05-03 MED ORDER — SODIUM CHLORIDE 0.9 % IV SOLN
INTRAVENOUS | Status: DC
  Administered 2015-05-03: 02:00:00 via INTRAVENOUS

## 2015-05-03 MED ORDER — OXYCODONE-ACETAMINOPHEN 5-325 MG PO TABS
1.0000 | ORAL_TABLET | Freq: Four times a day (QID) | ORAL | Status: DC | PRN
  Administered 2015-05-03 – 2015-05-04 (×4): 1 via ORAL
  Filled 2015-05-03 (×4): qty 1

## 2015-05-03 MED ORDER — MIRTAZAPINE 15 MG PO TABS
15.0000 mg | ORAL_TABLET | Freq: Every day | ORAL | Status: DC
  Administered 2015-05-03 – 2015-05-04 (×3): 15 mg via ORAL
  Filled 2015-05-03 (×4): qty 1

## 2015-05-03 MED ORDER — LORAZEPAM 1 MG PO TABS
2.0000 mg | ORAL_TABLET | Freq: Every day | ORAL | Status: DC
  Administered 2015-05-03 – 2015-05-04 (×3): 2 mg via ORAL
  Filled 2015-05-03 (×3): qty 2

## 2015-05-03 MED ORDER — METOPROLOL TARTRATE 25 MG PO TABS
25.0000 mg | ORAL_TABLET | Freq: Two times a day (BID) | ORAL | Status: DC
  Administered 2015-05-03 – 2015-05-05 (×6): 25 mg via ORAL
  Filled 2015-05-03 (×7): qty 1

## 2015-05-03 MED ORDER — ASPIRIN EC 81 MG PO TBEC
81.0000 mg | DELAYED_RELEASE_TABLET | Freq: Every evening | ORAL | Status: DC
  Administered 2015-05-03 – 2015-05-04 (×2): 81 mg via ORAL
  Filled 2015-05-03 (×3): qty 1

## 2015-05-03 MED ORDER — FERROUS SULFATE 325 (65 FE) MG PO TABS
325.0000 mg | ORAL_TABLET | Freq: Every day | ORAL | Status: DC
  Administered 2015-05-03 – 2015-05-05 (×3): 325 mg via ORAL
  Filled 2015-05-03 (×5): qty 1

## 2015-05-03 MED ORDER — OXYCODONE HCL 5 MG PO TABS
2.5000 mg | ORAL_TABLET | Freq: Four times a day (QID) | ORAL | Status: DC | PRN
  Administered 2015-05-03 – 2015-05-04 (×4): 2.5 mg via ORAL
  Filled 2015-05-03 (×4): qty 1

## 2015-05-03 MED ORDER — PANTOPRAZOLE SODIUM 40 MG PO TBEC: 40.0000 mg | DELAYED_RELEASE_TABLET | Freq: Every day | ORAL | Status: DC | PRN

## 2015-05-03 MED ORDER — MORPHINE SULFATE 2 MG/ML IJ SOLN
2.0000 mg | INTRAMUSCULAR | Status: DC | PRN
Start: 2015-05-03 — End: 2015-05-05

## 2015-05-03 MED ORDER — MELATONIN 5 MG PO TABS: 10.0000 mg | ORAL_TABLET | Freq: Every day | ORAL | Status: DC

## 2015-05-03 MED ORDER — HYDROCODONE-ACETAMINOPHEN 5-325 MG PO TABS
1.0000 | ORAL_TABLET | Freq: Four times a day (QID) | ORAL | Status: DC | PRN
  Administered 2015-05-03: 1 via ORAL
  Filled 2015-05-03: qty 1

## 2015-05-03 MED ORDER — VITAMIN D3 25 MCG (1000 UNIT) PO TABS
1000.0000 [IU] | ORAL_TABLET | Freq: Every day | ORAL | Status: DC
  Administered 2015-05-03 – 2015-05-05 (×3): 1000 [IU] via ORAL
  Filled 2015-05-03 (×3): qty 1

## 2015-05-03 MED ORDER — SODIUM CHLORIDE 0.9 % IV SOLN: INTRAVENOUS | Status: DC

## 2015-05-03 NOTE — Progress Notes (Signed)
UR Completed. Teretha Chalupa, RN, BSN.  [phone] 

## 2015-05-03 NOTE — Evaluation (Signed)
Occupational Therapy Evaluation Patient Details Name: [name] MRN: [id number] DOB: February 17, 1941 Today's Date: 05/03/2015    History of Present Illness Pt is a 74 y.o. female with a PMH of chronic combined systolic and diastolic heart failure, breast CA (s/p L mastectomy, chemo and radiation), pulmonary HTN, CAD, MVR, tricuspid valve valuloplasty, s/p Maze procedure, AICD placement, stroke, asthma, pernicious anemia. Pt presents with LLE pain, swelling and redness.    Clinical Impression   Pt admitted with above. Pt independent with ADLs, PTA. Feel pt will benefit from acute OT to increase independence, strength, and activity tolerance prior to d/c.     Follow Up Recommendations  No OT follow up;Supervision - Intermittent    Equipment Recommendations  3 in 1 bedside comode    Recommendations for Other Services       Precautions / Restrictions Precautions Precautions: Fall Restrictions Weight Bearing Restrictions: No      Mobility Bed Mobility Overal bed mobility: Needs Assistance Bed Mobility: Sit to Supine       Sit to supine: Min assist (assist to scoot HOB)   General bed mobility comments: Pt was able to transition to go to supine position with no physical assist, but trendelenburg position as well as assist and cues for technique to scoot HOB.   Transfers Overall transfer level: Needs assistance   Transfers: Sit to/from Stand Sit to Stand: Min guard;Min assist         General transfer comment: assist to stand from regular height toilet.    Balance  Min guard for ambulation. Balance not formally assessed. No LOB in session.                                          ADL Overall ADL's : Needs assistance/impaired     Grooming: Wash/dry hands;Set up;Supervision/safety;Standing               Lower Body Dressing: Min guard;Sit to/from stand   Toilet Transfer: Min guard;Minimal assistance;Ambulation;RW;Regular Toilet (Min assist  for sit to stand from toilet)   [place] and Hygiene: Min guard;Sit to/from stand       Functional mobility during ADLs: Min guard;Rolling walker General ADL Comments: Discussed getting 3 in 1 to use as shower chair. Pt trying to doff socks supine in bed and OT suggested sitting to do this and pt able to do so.     Vision     Perception     Praxis      Pertinent Vitals/Pain Pain Assessment: 0-10 Pain Score:  (6 in neck and 5 in right knee) Pain Location: right knee, neck, and back with bed mobility Pain Descriptors / Indicators: Sharp Pain Intervention(s): Monitored during session;Repositioned     Hand Dominance Right   Extremity/Trunk Assessment Upper Extremity Assessment Upper Extremity Assessment: Overall WFL for tasks assessed   Lower Extremity Assessment Lower Extremity Assessment: Defer to PT evaluation   Cervical / Trunk Assessment Cervical / Trunk Assessment: Kyphotic;Other exceptions Cervical / Trunk Exceptions: Head rotated forward, down and to the right. Pt states this has been worsening for ~5 years.    Communication Communication Communication: HOH   Cognition Arousal/Alertness: Lethargic Behavior During Therapy: WFL for tasks assessed/performed Overall Cognitive Status: No family/caregiver present to determine baseline cognitive functioning (seemed a little off-pt seemed HOH and lethargic)  General Comments       Exercises       Shoulder Instructions      Home Living Family/patient expects to be discharged to:: Private residence Living Arrangements: Spouse/significant other Available Help at Discharge: Family;Available 24 hours/day Type of Home: House Home Access: Level entry     Home Layout: One level     Bathroom Shower/Tub: Teacher, early years/pre: Handicapped height     Home Equipment: Environmental consultant - 4 wheels          Prior Functioning/Environment Level of Independence:  Independent with assistive device(s)        Comments: Uses the rollator in the community. The past few days have been using it in the house as well.     OT Diagnosis: Generalized weakness;Acute pain   OT Problem List: Decreased strength;Decreased range of motion;Pain;Decreased knowledge of precautions;Decreased knowledge of use of DME or AE;Decreased activity tolerance   OT Treatment/Interventions: Self-care/ADL training;DME and/or AE instruction;Therapeutic activities;Patient/family education;Balance training;Cognitive remediation/compensation;Therapeutic exercise;Energy conservation    OT Goals(Current goals can be found in the care plan section) Acute Rehab OT Goals Patient Stated Goal: not stated OT Goal Formulation: With patient Time For Goal Achievement: 05/10/15 Potential to Achieve Goals: Good ADL Goals Pt Will Perform Lower Body Bathing: sit to/from stand;with modified independence Pt Will Perform Lower Body Dressing: with modified independence;sit to/from stand Pt Will Transfer to Toilet: with modified independence;ambulating (elevated toilet) Pt Will Perform Tub/Shower Transfer: Tub transfer;with supervision;ambulating;rolling walker;3 in 1  OT Frequency: Min 2X/week   Barriers to D/C:            Co-evaluation              End of Session Equipment Utilized During Treatment: Gait belt;Rolling walker  Activity Tolerance: Patient limited by fatigue Patient left: in bed;with call bell/phone within reach   Time: 1542-1600 (few minutes urinating) OT Time Calculation (min): 18 min Charges:  OT General Charges $OT Visit: 1 Procedure OT Evaluation $Initial OT Evaluation Tier I: 1 Procedure G-CodesBenito Mccreedy OTR/L [id number] 05/03/2015, 4:12 PM

## 2015-05-03 NOTE — Care Management Note (Signed)
Case Management Note  Patient Details  Name: [name] MRN: [id number] Date of Birth: 1941-03-20  Subjective/Objective: Pt admitted with cellulitis of left leg                   Action/Plan:  Pt is from home.  Pt already active with [facility] through [place].   CM will continue to monitor for disposition needs   Expected Discharge Date:                  Expected Discharge Plan:  Home/Self Care  In-House Referral:     Discharge planning Services  CM Consult  Post Acute Care Choice:    Choice offered to:     DME Arranged:    DME Agency:     HH Arranged:    [place] Agency:     Status of Service:  In process, will continue to follow  Medicare Important Message Given:  No Date Medicare IM Given:    Medicare IM give by:    Date Additional Medicare IM Given:    Additional Medicare Important Message give by:     If discussed at [place] of Stay Meetings, dates discussed:    Additional Comments: CM assessed pt; pt already has rollator at home. Maryclare Labrador, RN 05/03/2015, 4:05 PM

## 2015-05-03 NOTE — Progress Notes (Signed)
[facility] PROGRESS NOTE  [name]:[id number] DOB: [birth year] DOA: 05/02/2015 PCP: Burton Apley, MD  Assessment/Plan: 1. Left lower extremity cellulitis. -Patient presenting with complaints of left lower extremity erythema, warmth, pain likely secondary to cellulitis. She was started on empiric IV antibiotic therapy with vancomycin and Zosyn. -On my evaluation this morning patient reporting having improvement to her left lower extremity stating that her pain has improved. -Lab work reviewed, white count trended down to 12,200 from 16,300 on admission.  2. History of chronic combined systolic and diastolic congestive heart failure. -Currently compensated.  -Torsemide and spironolactone were held on admission since it was felt she was dehydrated and hypovolemic from underlying infectious process -We'll discontinue gentle IV fluid hydration today, continue to monitor closely volume status.    3.  Stage III chronic kidney disease. -Patient having a baseline creatinine around 1.6-1.8. Initial lab work showing creatinine 1.7 -We'll continue monitoring kidney function.  4.  History of coronary artery disease. -Currently stable from a cardiac standpoint, she denies chest pain or shortness of breath -Continue medical management with aspirin, lisinopril, metoprolol and Crestor  5.  Hypertension. -Continue metoprolol 25 mg by mouth twice a day and lisinopril 2.5 mg by mouth daily  6.  Dyslipidemia. -Continue statin  Code Status: Full code Family Communication: Family not present Disposition Plan: Continue empiric IV antibiotic therapy   Antibiotics:  IV vancomycin  IV Zosyn  HPI/Subjective: Patient is a pleasant 74 year old female with a history of combined systolic and diastolic congestive heart failure, history of breast cancer, pulmonary hypertension, coronary artery disease, admitted to the medicine service on 05/02/2015 when she presented with complaints of left  lower extremity pain and erythema. She has a history of prior hospitalizations due to cellulitis, recently admitted in May 2016. On initial evaluation she was found to have erythema along with localized warmth and tenderness involving her left lower extremity. She was started on empiric IV antibiotic therapy with vancomycin and Zosyn.  Objective: Filed Vitals:   05/03/15 0408  BP: 108/59  Pulse: 75  Temp: 98.6 F (37 C)  Resp: 16   No intake or output data in the 24 hours ending 05/03/15 1216 Filed Weights   05/02/15 2156 05/03/15 0408  Weight: 65.318 kg (144 lb) 67.3 kg (148 lb 5.9 oz)    Exam:   General:  Patient is in no acute distress, nontoxic appearing, awake and alert  Cardiovascular: Regular rate and rhythm normal S1-S2 tenderness 6 systolic ejection murmur  Respiratory: Normal respiratory effort, no wheezing rhonchi or rales  Abdomen: Soft nontender nondistended  Musculoskeletal: Patient having erythema extending from her ankle to below the knee on left lower extremity, there is some localized warmth, has by lateral 1+ pitting edema to lower extremities  Data Reviewed: Basic Metabolic Panel:  Recent Labs Lab 05/02/15 2310 05/03/15 0432  NA 134* 135  K 4.6 4.2  CL 97* 102  CO2 23 22  GLUCOSE 122* 110*  BUN 30* 28*  CREATININE 1.73* 1.78*  CALCIUM 9.8 9.0   Liver Function Tests:  Recent Labs Lab 05/03/15 0432  AST 24  ALT 9*  ALKPHOS 78  BILITOT 1.1  PROT 7.2  ALBUMIN 2.9*   No results for input(s): LIPASE, AMYLASE in the last 168 hours. No results for input(s): AMMONIA in the last 168 hours. CBC:  Recent Labs Lab 05/02/15 2310 05/03/15 0432  WBC 16.3* 12.2*  NEUTROABS 13.4*  --   HGB 10.9* 9.4*  HCT 34.4* 29.5*  MCV 92.5  91.6  PLT 179 157   Cardiac Enzymes: No results for input(s): CKTOTAL, CKMB, CKMBINDEX, TROPONINI in the last 168 hours. BNP (last 3 results)  Recent Labs  03/08/15 2333 03/28/15 1600 05/03/15 0432  BNP 698.3*  220.7* 191.9*    ProBNP (last 3 results) No results for input(s): PROBNP in the last 8760 hours.  CBG:  Recent Labs Lab 05/03/15 0615  GLUCAP 99    No results found for this or any previous visit (from the past 240 hour(s)).   Studies: No results found.  Scheduled Meds: . aspirin EC  81 mg Oral QPM  . cholecalciferol  1,000 Units Oral Daily  . [START ON 05/05/2015] cyanocobalamin  1,000 mcg Intramuscular Q30 days  . ferrous sulfate  325 mg Oral Q breakfast  . folic acid  2 mg Oral Daily  . heparin  5,000 Units Subcutaneous 3 times per day  . levothyroxine  125 mcg Oral QAC breakfast  . lisinopril  2.5 mg Oral Daily  . LORazepam  2 mg Oral QHS  . magnesium oxide  400 mg Oral Daily  . metoprolol tartrate  25 mg Oral BID  . mirtazapine  15 mg Oral QHS  . piperacillin-tazobactam (ZOSYN)  IV  3.375 g Intravenous 3 times per day  . pramipexole  0.25 mg Oral 2 times per day  . rosuvastatin  20 mg Oral q1800  . sodium chloride  3 mL Intravenous Q12H  . temazepam  30 mg Oral QHS  . vancomycin  500 mg Intravenous Q24H   Continuous Infusions: . sodium chloride    . 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 If 7PM-7AM, please contact night-coverage at [email or web address], password [facility] 05/03/2015, 12:16 PM  LOS: 1 day

## 2015-05-03 NOTE — Evaluation (Signed)
Physical Therapy Evaluation Patient Details Name: [name] MRN: [id number] DOB: 06-01-1941 Today's Date: 05/03/2015   History of Present Illness  Pt is a 74 y/o female with a PMH of chronic combined systolic and diastolic heart failure, breast CA (s/p L mastectomy, chemo and radiation), pulmonary HTN, CAD, MVR, tricuspid valve valuloplasty, s/p Maze procedure, AICD placement, stroke, asthma, pernicious anemia. Pt presents with LLE pain, swelling and redness.   Clinical Impression  Pt admitted with above diagnosis. Pt currently with functional limitations due to the deficits listed below (see PT Problem List). At the time of PT eval pt was able to perform transfers and ambulation at a supervision to min guard level. Pt reports she has been managing well at home, and during session was able to mobilize without physical assist. Anticipate pt will be safe to return home at d/c. Pt will benefit from skilled PT to increase their independence and safety with mobility to allow discharge to the venue listed below.       Follow Up Recommendations Home health PT;Supervision for mobility/OOB    Equipment Recommendations  None recommended by PT    Recommendations for Other Services       Precautions / Restrictions Precautions Precautions: Fall Restrictions Weight Bearing Restrictions: No      Mobility  Bed Mobility Overal bed mobility: Needs Assistance Bed Mobility: Supine to Sit     Supine to sit: Supervision     General bed mobility comments: Pt was able to transition to EOB with no physical assistance. Increased time and use of bed rails requierd.   Transfers Overall transfer level: Needs assistance Equipment used: None Transfers: Sit to/from Stand Sit to Stand: Min guard         General transfer comment: Hands-on guarding initially for safety however pt progressed to supervision level with following attempts.   Ambulation/Gait Ambulation/Gait assistance: Min  guard Ambulation Distance (Feet): 150 Feet Assistive device: Rolling walker (2 wheeled) Gait Pattern/deviations: Step-through pattern;Decreased stride length;Trunk flexed Gait velocity: Decreased Gait velocity interpretation: Below normal speed for age/gender General Gait Details: Hands-on guarding for safety. Pt required cues for obstacles ahead as she has difficulty scanning due to neck position. Pt took 1 standing rest break prior to return to room.   Stairs            Wheelchair Mobility    Modified Rankin (Stroke Patients Only)       Balance Overall balance assessment: Needs assistance Sitting-balance support: Feet supported;No upper extremity supported Sitting balance-Leahy Scale: Good     Standing balance support: Single extremity supported Standing balance-Leahy Scale: Fair                               Pertinent Vitals/Pain Pain Assessment: No/denies pain    Home Living Family/patient expects to be discharged to:: Private residence Living Arrangements: Spouse/significant other Available Help at Discharge: Family;Available 24 hours/day Type of Home: House Home Access: Level entry     Home Layout: One level Home Equipment: Walker - 4 wheels      Prior Function Level of Independence: Independent with assistive device(s)         Comments: Uses the rollator in the community. The past few days have been using it in the house as well.      Hand Dominance   Dominant Hand: Right    Extremity/Trunk Assessment   Upper Extremity Assessment: Defer to OT evaluation  Lower Extremity Assessment: Generalized weakness      Cervical / Trunk Assessment: Kyphotic;Other exceptions  Communication   Communication: No difficulties  Cognition Arousal/Alertness: Awake/alert Behavior During Therapy: WFL for tasks assessed/performed Overall Cognitive Status: Within Functional Limits for tasks assessed                       General Comments      Exercises        Assessment/Plan    PT Assessment Patient needs continued PT services  PT Diagnosis Difficulty walking;Generalized weakness   PT Problem List Decreased strength;Decreased range of motion;Decreased activity tolerance;Decreased balance;Decreased mobility;Decreased knowledge of use of DME;Decreased safety awareness;Decreased knowledge of precautions;Cardiopulmonary status limiting activity  PT Treatment Interventions DME instruction;Gait training;Stair training;Functional mobility training;Therapeutic activities;Therapeutic exercise;Neuromuscular re-education;Patient/family education   PT Goals (Current goals can be found in the Care Plan section) Acute Rehab PT Goals Patient Stated Goal: Return to PLOF PT Goal Formulation: With patient Time For Goal Achievement: 05/17/15 Potential to Achieve Goals: Good    Frequency Min 3X/week   Barriers to discharge        Co-evaluation               End of Session Equipment Utilized During Treatment: Gait belt Activity Tolerance: Patient tolerated treatment well Patient left: in chair;with call bell/phone within reach;with chair alarm set Nurse Communication: Mobility status         Time: 0762-2633 PT Time Calculation (min) (ACUTE ONLY): 26 min   Charges:   PT Evaluation $Initial PT Evaluation Tier I: 1 Procedure PT Treatments $Gait Training: 8-22 mins   PT G Codes:        Rolinda Roan May 31, 2015, 1:03 PM   Rolinda Roan, PT, DPT Acute Rehabilitation Services Pager: [phone]

## 2015-05-03 NOTE — Progress Notes (Signed)
ANTIBIOTIC CONSULT NOTE - INITIAL  Pharmacy Consult for Vancomycin/Zosyn  Indication: rule out sepsis, cellulitis  Allergies  Allergen Reactions  . Butorphanol Tartrate Other (See Comments)    REACTION: migraines, nervous/agitated  . Levofloxacin Other (See Comments)    Avoid QTc prolonging medications  . Zofran [Ondansetron Hcl] Other (See Comments)    Avoid QTc prolonging medications  . Codeine Nausea And Vomiting  . Lipitor [Atorvastatin Calcium] Diarrhea and Other (See Comments)    nightmares  . Revatio [Sildenafil Citrate] Swelling  . Keflex [Cephalexin] Other (See Comments)    Listed as allergy at [facility], but tolerated cefepime here.   Patient Measurements: Height: 5\' 1"  (154.9 cm) Weight: 144 lb (65.318 kg) IBW/kg (Calculated) : 47.8   Vital Signs: Temp: 99.1 F (37.3 C) (06/21 2156) Temp Source: Oral (06/21 2156) BP: 122/52 mmHg (06/22 0045) Pulse Rate: 79 (06/22 0045)  Labs:  Recent Labs  05/02/15 2310  WBC 16.3*  HGB 10.9*  PLT 179  CREATININE 1.73*   Estimated Creatinine Clearance: 25.8 mL/min (by C-G formula based on Cr of 1.73).   Microbiology: No results found for this or any previous visit (from the past 720 hour(s)).  Medical History: Past Medical History  Diagnosis Date  . Pulmonary hypertension     s/p RHC 4/10 - PAP 86/29, wedge 21-23  . Gastric ulcer with hemorrhage   . Rheumatic fever     Childhood  . Coronary artery disease     Nonobstructive, LVEF 55%  . Mitral regurgitation     a. s/p bioprosthetic MVR  . Tricuspid regurgitation     a. s/p TV repair  . Hypertension   . Stroke     a. 10/2010 with residual L sided weakness  . Asthma     PT. ON OXYGEN 2 L/[place]   . Diabetes mellitus   . Hypothyroidism   . Anemia, pernicious 1979  . Hepatitis     PT. STATES SHE HAD IT WHEN SHE WAS 14 YRS. OLD  . Arthritis   . GERD (gastroesophageal reflux disease)   . Fibromyalgia   . Squamous cell carcinoma     Left arm  . Breast  cancer     2008 s/p chemo and radiation (last tx 4/09) s/p left mastectomy s/p 9 lymph node resection - [name], [place]  . Atrial fibrillation     a. s/p MAZE  . 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 AICD (automatic cardioverter/defibrillator) present   . CKD (chronic kidney disease), stage III    Assessment: 74 y/o F here with left leg cellulitis, WBC elevated, noted renal dysfunction, other labs as above.   Goal of Therapy:  Vancomycin trough level 15-20 mcg/ml  Plan:  -Vancomycin 500 mg IV q24h -Zosyn 3.375G IV q8h to be infused over 4 hours -Trend WBC, temp, renal function  -Drug levels as indicated   Narda Bonds 05/03/2015,1:22 AM

## 2015-05-03 NOTE — Progress Notes (Signed)
Pt arrived to unit from ED per stretcher. Pt VSS. Complains of neck pain. Medication administered. Pt oriented to unit. Call bell within reach. Will continue to monitor.  Raliegh Ip RN

## 2015-05-04 DIAGNOSIS — I480 Paroxysmal atrial fibrillation: Secondary | ICD-10-CM

## 2015-05-04 LAB — CBC
HEMATOCRIT: 29 % — AB (ref 36.0–46.0)
HEMOGLOBIN: 9.1 g/dL — AB (ref 12.0–15.0)
MCH: 28.9 pg (ref 26.0–34.0)
MCHC: 31.4 g/dL (ref 30.0–36.0)
MCV: 92.1 fL (ref 78.0–100.0)
Platelets: 151 10*3/uL (ref 150–400)
RBC: 3.15 MIL/uL — AB (ref 3.87–5.11)
RDW: 17.6 % — ABNORMAL HIGH (ref 11.5–15.5)
WBC: 8.5 10*3/uL (ref 4.0–10.5)

## 2015-05-04 LAB — BASIC METABOLIC PANEL
Anion gap: 11 (ref 5–15)
BUN: 25 mg/dL — ABNORMAL HIGH (ref 6–20)
CO2: 22 mmol/L (ref 22–32)
Calcium: 8.6 mg/dL — ABNORMAL LOW (ref 8.9–10.3)
Chloride: 104 mmol/L (ref 101–111)
Creatinine, Ser: 1.57 mg/dL — ABNORMAL HIGH (ref 0.44–1.00)
GFR, EST AFRICAN AMERICAN: 37 mL/min — AB (ref >60)
GFR, EST NON AFRICAN AMERICAN: 32 mL/min — AB (ref >60)
Glucose, Bld: 95 mg/dL (ref 65–99)
POTASSIUM: 3.8 mmol/L (ref 3.5–5.1)
Sodium: 137 mmol/L (ref 135–145)

## 2015-05-04 NOTE — Care Management Note (Signed)
Case Management Note  Patient Details  Name: [name] MRN: [id number] Date of Birth: [birth year]  Subjective/Objective: Pt admitted with cellulitis of left leg                   Action/Plan:  Pt is from home.  Pt already active with [facility] through [place].   CM will continue to monitor for disposition needs   Expected Discharge Date:                  Expected Discharge Plan:  Home/Self Care  In-House Referral:     Discharge planning Services  CM Consult  Post Acute Care Choice:    Choice offered to:  Patient  DME Arranged:    DME Agency:     HH Arranged:   RN resumption, PT  HH Agency:   [place]  Status of Service:  In process, will continue to follow  Medicare Important Message Given:  No Date Medicare IM Given:    Medicare IM give by:    Date Additional Medicare IM Given:    Additional Medicare Important Message give by:     If discussed at [place] of Stay Meetings, dates discussed:    Additional Comments: CM assessed pt; pt is active with [facility] for RN services.  Pt recommend HHPT, CM offered pt choice, pt [facility],  CM contacted agency [facility]), referral was accepted.  CM will fax required documents once order is written.  Pt refused 3:1, stated her bed is no more than 4 steps from bed.  CM requested bedside nurse to obtain [facility] order from MD.   CM will continue to monitor.  Maryclare Labrador, RN 05/04/2015, 2:14 PM

## 2015-05-04 NOTE — Progress Notes (Signed)
[facility] PROGRESS NOTE  [name] QVZ:[id number] DOB: May 08, 1941 DOA: 05/02/2015 PCP: Burton Apley, MD  Assessment/Plan: 1. Left lower extremity cellulitis. -Patient presenting with complaints of left lower extremity erythema, warmth, pain likely secondary to cellulitis. She was started on empiric IV antibiotic therapy with vancomycin and Zosyn. -Patient reporting daily improvement, labs showing downward trend in white count to 8,500  -Will transition to oral antibiotics in AM  2. History of chronic combined systolic and diastolic congestive heart failure. -Currently compensated.  -Torsemide and spironolactone were held on admission since it was felt she was dehydrated and hypovolemic from underlying infectious process -Will restart spironolactone  3.  Stage III chronic kidney disease. -Patient having a baseline creatinine around 1.6-1.8. Initial lab work showing creatinine 1.7 -AM labs showing Cr of 1.57  4.  History of coronary artery disease. -Currently stable from a cardiac standpoint, she denies chest pain or shortness of breath -Continue medical management with aspirin, lisinopril, metoprolol and Crestor  5.  Hypertension. -Continue metoprolol 25 mg by mouth twice a day and lisinopril 2.5 mg by mouth daily  6.  Dyslipidemia. -Continue statin  Code Status: Full code Family Communication: Family not present Disposition Plan: Continue empiric IV antibiotic therapy   Antibiotics:  IV vancomycin  IV Zosyn  HPI/Subjective: Patient is a pleasant 74 year old female with a history of combined systolic and diastolic congestive heart failure, history of breast cancer, pulmonary hypertension, coronary artery disease, admitted to the medicine service on 05/02/2015 when she presented with complaints of left lower extremity pain and erythema. She has a history of prior hospitalizations due to cellulitis, recently admitted in May 2016. On initial evaluation she was found  to have erythema along with localized warmth and tenderness involving her left lower extremity. She was started on empiric IV antibiotic therapy with vancomycin and Zosyn.  Objective: Filed Vitals:   05/04/15 0428  BP: 119/56  Pulse: 71  Temp: 98.3 F (36.8 C)  Resp: 18    Intake/Output Summary (Last 24 hours) at 05/04/15 1338 Last data filed at 05/04/15 0800  Gross per 24 hour  Intake    420 ml  Output    325 ml  Net     95 ml   Filed Weights   05/02/15 2156 05/03/15 0408 05/04/15 0428  Weight: 65.318 kg (144 lb) 67.3 kg (148 lb 5.9 oz) 67.8 kg (149 lb 7.6 oz)    Exam:   General:  Patient is in no acute distress, nontoxic appearing, awake and alert  Cardiovascular: Regular rate and rhythm normal S1-S2 tenderness 6 systolic ejection murmur  Respiratory: Normal respiratory effort, no wheezing rhonchi or rales  Abdomen: Soft nontender nondistended  Musculoskeletal: Improved erythema extending from her ankle to below the knee on left lower extremity,  bilateral 1+ pitting edema to lower extremities  Data Reviewed: Basic Metabolic Panel:  Recent Labs Lab 05/02/15 2310 05/03/15 0432 05/04/15 0428  NA 134* 135 137  K 4.6 4.2 3.8  CL 97* 102 104  CO2 23 22 22   GLUCOSE 122* 110* 95  BUN 30* 28* 25*  CREATININE 1.73* 1.78* 1.57*  CALCIUM 9.8 9.0 8.6*   Liver Function Tests:  Recent Labs Lab 05/03/15 0432  AST 24  ALT 9*  ALKPHOS 78  BILITOT 1.1  PROT 7.2  ALBUMIN 2.9*   No results for input(s): LIPASE, AMYLASE in the last 168 hours. No results for input(s): AMMONIA in the last 168 hours. CBC:  Recent Labs Lab 05/02/15 2310 05/03/15 0432 05/04/15  0428  WBC 16.3* 12.2* 8.5  NEUTROABS 13.4*  --   --   HGB 10.9* 9.4* 9.1*  HCT 34.4* 29.5* 29.0*  MCV 92.5 91.6 92.1  PLT 179 157 151   Cardiac Enzymes: No results for input(s): CKTOTAL, CKMB, CKMBINDEX, TROPONINI in the last 168 hours. BNP (last 3 results)  Recent Labs  03/08/15 2333  03/28/15 1600 05/03/15 0432  BNP 698.3* 220.7* 191.9*    ProBNP (last 3 results) No results for input(s): PROBNP in the last 8760 hours.  CBG:  Recent Labs Lab 05/03/15 0615  GLUCAP 99    Recent Results (from the past 240 hour(s))  Culture, blood (x 2)     Status: None (Preliminary result)   Collection Time: 05/03/15  1:35 AM  Result Value Ref Range Status   Specimen Description BLOOD RIGHT WRIST  Final   Special Requests BOTTLES DRAWN AEROBIC ONLY 10CC  Final   Culture NO GROWTH 1 DAY  Final   Report Status PENDING  Incomplete  Culture, blood (x 2)     Status: None (Preliminary result)   Collection Time: 05/03/15  1:43 AM  Result Value Ref Range Status   Specimen Description BLOOD RIGHT HAND  Final   Special Requests BOTTLES DRAWN AEROBIC ONLY 10CC  Final   Culture NO GROWTH 1 DAY  Final   Report Status PENDING  Incomplete     Studies: No results found.  Scheduled Meds: . aspirin EC  81 mg Oral QPM  . cholecalciferol  1,000 Units Oral Daily  . [START ON 05/05/2015] cyanocobalamin  1,000 mcg Intramuscular Q30 days  . ferrous sulfate  325 mg Oral Q breakfast  . folic acid  2 mg Oral Daily  . heparin  5,000 Units Subcutaneous 3 times per day  . levothyroxine  125 mcg Oral QAC breakfast  . lisinopril  2.5 mg Oral Daily  . LORazepam  2 mg Oral QHS  . magnesium oxide  400 mg Oral Daily  . metoprolol tartrate  25 mg Oral BID  . mirtazapine  15 mg Oral QHS  . piperacillin-tazobactam (ZOSYN)  IV  3.375 g Intravenous 3 times per day  . pramipexole  0.25 mg Oral 2 times per day  . rosuvastatin  20 mg Oral q1800  . sodium chloride  3 mL Intravenous Q12H  . temazepam  30 mg Oral QHS  . 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 If 7PM-7AM, please contact night-coverage at [email or web address], password [facility] 05/04/2015, 1:38 PM  LOS: 2 days

## 2015-05-04 NOTE — Progress Notes (Signed)
Contacted MD last night around 2350 about giving Pt. Lopressor with  BP of 107/47 and pulse of 67. Md said to go ahead an give. Aundra Pung 6:12 AM

## 2015-05-05 LAB — GLUCOSE, CAPILLARY: GLUCOSE-CAPILLARY: 119 mg/dL — AB (ref 65–99)

## 2015-05-05 MED ORDER — SPIRONOLACTONE 12.5 MG HALF TABLET
12.5000 mg | ORAL_TABLET | Freq: Every day | ORAL | Status: DC
  Administered 2015-05-05: 12.5 mg via ORAL
  Filled 2015-05-05: qty 1

## 2015-05-05 MED ORDER — AMOXICILLIN-POT CLAVULANATE 875-125 MG PO TABS
1.0000 | ORAL_TABLET | Freq: Two times a day (BID) | ORAL | Status: DC
  Administered 2015-05-05: 1 via ORAL
  Filled 2015-05-05 (×2): qty 1

## 2015-05-05 MED ORDER — DOXYCYCLINE HYCLATE 100 MG PO CAPS: 100.0000 mg | ORAL_CAPSULE | Freq: Two times a day (BID) | ORAL | Status: DC

## 2015-05-05 NOTE — Care Management Note (Signed)
Case Management Note  Patient Details  Name: [name] MRN: [id number] Date of Birth: 07-03-1941  Subjective/Objective: Pt admitted with cellulitis of left leg                   Action/Plan:  Pt is from home.  Pt already active with [facility] through [place].   CM will continue to monitor for disposition needs   Expected Discharge Date:                  Expected Discharge Plan:  Home/Self Care  In-House Referral:     Discharge planning Services  CM Consult  Post Acute Care Choice:    Choice offered to:  Patient  DME Arranged:    DME Agency:     HH Arranged:   RN resumption, PT  [place] Agency:   [facility]  Status of Service:  Complete, will sign off  Medicare Important Message Given: Yes Date Medicare IM Given:  05/05/15 Medicare IM give by:  Elenor Quinones Date Additional Medicare IM Given:    Additional Medicare Important Message give by:     If discussed at [place] of Stay Meetings, dates discussed:    Additional Comments: 05/04/13 Elenor Quinones, RN, BSN [phone] CM faxed required documents to [facility], referral has been accepted.  Pt will be discharge home with home health services.  NO other CM needs.  05/04/15 Elenor Quinones, RN, BSN [phone] CM assessed pt; pt is active with [facility] for RN services.  Pt recommend HHPT, CM offered pt choice, pt chose [facility],  CM contacted agency [facility]), referral was accepted.  CM will fax required documents once order is written.  Pt refused 3:1, stated her bed is no more than 4 steps from bed.  CM requested bedside nurse to obtain [facility] order from MD.   CM will continue to monitor.  Maryclare Labrador, RN 05/05/2015, 10:28 AM

## 2015-05-05 NOTE — Discharge Summary (Signed)
Physician Discharge Summary  [name]:[id number] DOB: Aug 03, 1941 DOA: 05/02/2015  PCP: Burton Apley, MD  Admit date: 05/02/2015 Discharge date: 05/05/2015  Time spent: 35 minutes  Recommendations for Outpatient Follow-up:  1. Please follow-up on left lower extremity cellulitis, she was discharged on 6 more days of doxycycline 100 mg by mouth twice a day 2. She was set up with home health services for home PT and RN prior to discharge 3. 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 female with PMH of chronic combined systolic and diastolic heart failure, hypothyroidism, anemia, GERD, breast cancer (status post of L mastectomy, chemotherapy and radiation), pulmonary hypertension, PUD, GI bleed, coronary artery disease, mitral valve replacement, tricuspid valve valulopasty, s/p of Maze procedure, AICD placement, stroke, asthma, pernicious anemia, who presents with leg pain, redness and swelling.  Patient was recently hospitalized twice due to cellulitis. Recent admission was from 5/24 to 5/27 due to bilateral leg cellulitis  and sepsis. Patient was treated with IV vancomycin and Zosyn, and discharged on Augmentin. She completed Augmentin on 04/01/15. She has been doing okay until yesterday when she started having worsening left leg pain, redness and swelling. She had temperature 104.5 at home. She was evaluated by PCP and started with antibodies (possible Augmentin) again, but without any help. She still has severe pain in left lower leg. She reports that she had to episode of chest pain in the morning and afternoon, which lasted for a few minutes, then resolved spontaneously. Currently no any chest pain. Patient does not have abdominal pain, diarrhea, any symptoms for UTI.  In ED, patient was found to have WBC 16.3, temperature 99.1, tachycardia, stable renal function. Patient is admitted to inpatient for further evaluation and treatment.  Hospital Course:  Patient is a pleasant 74 year old female with a history of combined systolic and diastolic congestive heart failure, history of breast cancer, pulmonary hypertension, coronary artery disease, admitted to the medicine service on 05/02/2015 when she presented with complaints of left lower extremity pain and erythema. She has a history of prior hospitalizations due to cellulitis, recently admitted in May 2016. On initial evaluation she was found to have erythema along with localized warmth and tenderness involving her left lower extremity. She was started on empiric IV antibiotic therapy with vancomycin and Zosyn.   Left lower extremity cellulitis. -Patient presenting with complaints of left lower extremity erythema, warmth, pain likely secondary to cellulitis. She was started on empiric IV antibiotic therapy with vancomycin and Zosyn. -Patient reporting daily improvement, labs on 05/04/2015 showing downward trend in white count to 8,500  -  Given clinical improvement she was discharged on 6 01/13/2015 on 6 more days of doxycycline 100 mg by mouth twice a day  2. History of  chronic combined systolic and diastolic congestive heart failure. -Currently compensated.  -Torsemide and spironolactone were held on admission since it was felt she was dehydrated and hypovolemic from underlying infectious process -Given clinical improvement, she was discharged on her home regimen of torsemide and spironolactone. Please follow-up on blood status  3. Stage III chronic kidney disease. -Stable. Please follow-up on BMP on hospital follow-up visit  4. History of coronary artery disease. -Currently stable from a cardiac standpoint, she denies chest pain or shortness of breath -Continue medical management with aspirin, lisinopril, metoprolol and Crestor  5. Hypertension. -Continue metoprolol 25 mg by mouth twice a day and lisinopril 2.5 mg by mouth daily   Discharge Exam: Filed Vitals:   05/05/15 1001  BP: 130/57  Pulse:   Temp:   Resp:     General: Patient states feeling much better, would like to go home today.  Cardiovascular: Regular rate and rhythm normal S1-S2 tenderness 6 systolic ejection murmur  Respiratory: Normal respiratory effort, no wheezing rhonchi or rales  Abdomen: Soft nontender nondistended  Musculoskeletal: Improved erythema extending from her ankle to below the knee on left lower extremity, bilateral 1+ pitting edema to lower extremities  Discharge Instructions   Discharge Instructions    Call MD for:  difficulty breathing, headache or visual disturbances    Complete by:  As directed      Call MD for:  extreme fatigue    Complete by:  As directed      Call MD for:  hives    Complete by:  As directed      Call MD for:  persistant dizziness or light-headedness    Complete by:  As directed      Call MD for:  persistant nausea and vomiting    Complete by:  As directed      Call MD for:  redness, tenderness, or signs of infection (pain, swelling, redness, odor or green/yellow discharge around incision site)    Complete by:  As directed       Call MD for:  severe uncontrolled pain    Complete by:  As directed      Call MD for:  temperature >100.4    Complete by:  As directed      Call MD for:    Complete by:  As directed      Diet - low sodium heart healthy    Complete by:  As directed      Increase activity slowly    Complete by:  As directed           Current Discharge Medication List    START taking these medications   Details  doxycycline (VIBRAMYCIN) 100 MG capsule Take 1 capsule (100 mg total) by mouth 2 (two) times daily. Qty: 12 capsule, Refills: 0      CONTINUE these medications which have NOT CHANGED   Details  albuterol (PROVENTIL HFA;VENTOLIN HFA) 108 (90 BASE) MCG/ACT inhaler Inhale 1 puff into the lungs every 6 (six) hours as needed for wheezing or shortness of breath.    aspirin EC 81 MG tablet Take 81 mg by mouth every evening.    cholecalciferol (VITAMIN D) 1000 UNITS tablet Take 1,000 Units by mouth daily.    cyanocobalamin (,VITAMIN B-12,) 1000 MCG/ML injection Inject 1,000 mcg into the muscle every 30 (thirty) days.  Takes on 23rd or 24th each month    folic acid (FOLVITE) 1 MG tablet Take 2 mg by mouth daily.     levothyroxine (SYNTHROID, LEVOTHROID) 125 MCG tablet Take 125 mcg by mouth daily before breakfast.    lisinopril (PRINIVIL,ZESTRIL) 5 MG tablet Take 2.5 mg by mouth daily.    LORazepam (ATIVAN) 1 MG tablet Take 2 mg by mouth at bedtime.  Refills: 3    magnesium oxide (MAG-OX) 400 (241.3 MG) MG tablet Take 400 mg by mouth daily. Refills: 3    Melatonin 5 MG TABS Take 10 mg by mouth at bedtime.    metoprolol tartrate (LOPRESSOR) 25 MG tablet Take 25 mg by mouth 2 (two) times daily.    mirtazapine (REMERON) 15 MG tablet Take 15 mg by mouth at bedtime.     nitroGLYCERIN (NITROSTAT) 0.4 MG SL tablet Place 0.4 mg under the tongue every 5 (five) minutes as needed for chest pain.    oxyCODONE-acetaminophen (PERCOCET) 7.5-325 MG per tablet Take 1 tablet by mouth every 4 (four)  hours as needed for moderate pain.     pantoprazole (PROTONIX) 40 MG tablet Take 40 mg by mouth daily as needed (for heartburn).    pramipexole (MIRAPEX) 0.25 MG tablet Take 0.25 mg by mouth. Take 1 tablet by mouth at 4 PM and 1 at bedtime. Refills: 3    rosuvastatin (CRESTOR) 20 MG tablet Take 1 tablet (20 mg total) by mouth daily at 6 PM. Qty: 30 tablet, Refills: 0    torsemide (DEMADEX) 20 MG tablet Take 1 tablet (20 mg total) by mouth 2 (two) times daily. Qty: 60 tablet, Refills: 0    ferrous sulfate 325 (65 FE) MG tablet Take 1 tablet (325 mg total) by mouth daily with breakfast. Qty: 30 tablet, Refills: 0    spironolactone (ALDACTONE) 25 MG tablet Take 0.5 tablets (12.5 mg total) by mouth daily. Qty: 30 tablet, Refills: 6      STOP taking these medications     amoxicillin-clavulanate (AUGMENTIN) 500-125 MG per tablet      potassium chloride SA (K-DUR,KLOR-CON) 20 MEQ tablet      temazepam (RESTORIL) 30 MG capsule      hydrocodone-acetaminophen (LORCET PLUS) 7.5-650 MG per tablet        Allergies  Allergen Reactions  . Butorphanol Tartrate Other (See Comments)    REACTION: migraines, nervous/agitated  . Levofloxacin Other (See Comments)    Avoid QTc prolonging medications  . Zofran [Ondansetron Hcl] Other (See Comments)    Avoid QTc prolonging medications  . Codeine Nausea And Vomiting  . Lipitor [Atorvastatin Calcium] Diarrhea and Other (See Comments)    nightmares  . Revatio [Sildenafil Citrate] Swelling  . Keflex [Cephalexin] Other (See Comments)    Listed as allergy at [facility], but tolerated cefepime here.   Follow-up Information    Follow up with ELLER,EDWARD, MD In 1 week.   Specialty:  Family Medicine   Contact information:   [address] [place] [address] [phone]       Follow up with [facility].   Why:  Home Health Division: Registered Nurse and Physical Therapist   Contact information:   [phone]  Dr Elder [place] [address] [phone]        The results of significant diagnostics from this hospitalization (including imaging, microbiology, ancillary and laboratory) are listed below for reference.    Significant Diagnostic Studies: No results found.  Microbiology: Recent Results (from the past 240 hour(s))  Culture, blood (  x 2)     Status: None (Preliminary result)   Collection Time: 05/03/15  1:35 AM  Result Value Ref Range Status   Specimen Description BLOOD RIGHT WRIST  Final   Special Requests BOTTLES DRAWN AEROBIC ONLY 10CC  Final   Culture NO GROWTH 1 DAY  Final   Report Status PENDING  Incomplete  Culture, blood (x 2)     Status: None (Preliminary result)   Collection Time: 05/03/15  1:43 AM  Result Value Ref Range Status   Specimen Description BLOOD RIGHT HAND  Final   Special Requests BOTTLES DRAWN AEROBIC ONLY 10CC  Final   Culture NO GROWTH 1 DAY  Final   Report Status PENDING  Incomplete     Labs: Basic Metabolic Panel:  Recent Labs Lab 05/02/15 2310 05/03/15 0432 05/04/15 0428  NA 134* 135 137  K 4.6 4.2 3.8  CL 97* 102 104  CO2 23 22 22   GLUCOSE 122* 110* 95  BUN 30* 28* 25*  CREATININE 1.73* 1.78* 1.57*  CALCIUM 9.8 9.0 8.6*   Liver Function Tests:  Recent Labs Lab 05/03/15 0432  AST 24  ALT 9*  ALKPHOS 78  BILITOT 1.1  PROT 7.2  ALBUMIN 2.9*   No results for input(s): LIPASE, AMYLASE in the last 168 hours. No results for input(s): AMMONIA in the last 168 hours. CBC:  Recent Labs Lab 05/02/15 2310 05/03/15 0432 05/04/15 0428  WBC 16.3* 12.2* 8.5  NEUTROABS 13.4*  --   --   HGB 10.9* 9.4* 9.1*  HCT 34.4* 29.5* 29.0*  MCV 92.5 91.6 92.1  PLT 179 157 151   Cardiac Enzymes: No results for input(s): CKTOTAL, CKMB, CKMBINDEX, TROPONINI in the last 168 hours. BNP: BNP (last 3 results)  Recent Labs  03/08/15 2333 03/28/15 1600 05/03/15 0432  BNP 698.3* 220.7* 191.9*    ProBNP (last 3 results) No results for  input(s): PROBNP in the last 8760 hours.  CBG:  Recent Labs Lab 05/03/15 0615 05/05/15 0618  GLUCAP 99 119*       Signed:  Sidney Kann  [facility] 05/05/2015, 12:49 PM

## 2015-05-05 NOTE — Progress Notes (Signed)
Physical Therapy Treatment Patient Details Name: [name] MRN: [id number] DOB: 10-Dec-1940 Today's Date: 05/05/2015    History of Present Illness Pt is a 74 y/o female with a PMH of chronic combined systolic and diastolic heart failure, breast CA (s/p L mastectomy, chemo and radiation), pulmonary HTN, CAD, MVR, tricuspid valve valuloplasty, s/p Maze procedure, AICD placement, stroke, asthma, pernicious anemia. Pt presents with LLE pain, swelling and redness.     PT Comments    Pt progressing towards physical therapy goals. Was able to improve abulation distance this session with less SOB noted and rest breaks required. O2 sats maintained in 90's throughout session on RA. Pt anticipates d/c home today. Continue to recommend HHPT to follow.  Follow Up Recommendations  Home health PT;Supervision for mobility/OOB     Equipment Recommendations  None recommended by PT    Recommendations for Other Services       Precautions / Restrictions Precautions Precautions: Fall Restrictions Weight Bearing Restrictions: No    Mobility  Bed Mobility Overal bed mobility: Needs Assistance Bed Mobility: Supine to Sit     Supine to sit: Supervision     General bed mobility comments: Pt was able to transition to EOB with no physical assistance. Increased time and use of bed rails requierd.   Transfers Overall transfer level: Needs assistance Equipment used: Rolling walker (2 wheeled) Transfers: Sit to/from Stand Sit to Stand: Supervision         General transfer comment: Supervision for safety.   Ambulation/Gait Ambulation/Gait assistance: Min guard Ambulation Distance (Feet): 200 Feet Assistive device: Rolling walker (2 wheeled) Gait Pattern/deviations: Step-through pattern;Decreased stride length;Trunk flexed (Extreme forward head posture) Gait velocity: Decreased Gait velocity interpretation: Below normal speed for age/gender General Gait Details: Hands-on guarding for safety.  Pt required cues for obstacles ahead as she has difficulty scanning due to neck position.   Stairs            Wheelchair Mobility    Modified Rankin (Stroke Patients Only)       Balance Overall balance assessment: Needs assistance Sitting-balance support: Feet supported;No upper extremity supported Sitting balance-Leahy Scale: Good     Standing balance support: No upper extremity supported Standing balance-Leahy Scale: Fair                      Cognition Arousal/Alertness: Awake/alert Behavior During Therapy: WFL for tasks assessed/performed Overall Cognitive Status: Within Functional Limits for tasks assessed                      Exercises      General Comments        Pertinent Vitals/Pain Pain Assessment: No/denies pain    Home Living                      Prior Function            PT Goals (current goals can now be found in the care plan section) Acute Rehab PT Goals Patient Stated Goal: Return to PLOF PT Goal Formulation: With patient Time For Goal Achievement: 05/17/15 Potential to Achieve Goals: Good Progress towards PT goals: Progressing toward goals    Frequency  Min 3X/week    PT Plan Current plan remains appropriate    Co-evaluation             End of Session Equipment Utilized During Treatment: Gait belt Activity Tolerance: Patient tolerated treatment well Patient left: in chair;with call bell/phone  within reach     Time: 1031-1053 PT Time Calculation (min) (ACUTE ONLY): 22 min  Charges:  $Gait Training: 8-22 mins                    G Codes:      Rolinda Roan 25-May-2015, 12:29 PM   Rolinda Roan, PT, DPT Acute Rehabilitation Services Pager: [phone]

## 2015-05-05 NOTE — Progress Notes (Signed)
Pt was switched from IV antibiotics to po antibiotics for continued treatment for left lower extremity cellulitis. Pt denied any pain to left leg and no redness noted. Orders to discharge pt to home. I reviewed discharge instructions with pt and daughter. They both verbalized understanding. Pt discharged to home at 1445. Daughter transporting pt.

## 2015-05-08 LAB — CULTURE, BLOOD (ROUTINE X 2)
CULTURE: NO GROWTH
Culture: NO GROWTH

## 2015-09-22 ENCOUNTER — Other Ambulatory Visit (HOSPITAL_COMMUNITY): Payer: Self-pay | Admitting: Cardiology

## 2015-10-11 ENCOUNTER — Other Ambulatory Visit (HOSPITAL_COMMUNITY): Payer: Self-pay | Admitting: Cardiology

## 2015-10-27 ENCOUNTER — Inpatient Hospital Stay (HOSPITAL_COMMUNITY)
Admission: AD | Admit: 2015-10-27 | Discharge: 2015-10-31 | DRG: 194 | Disposition: A | Discharge status: Home Health | Payer: Medicare Other | Source: Other Acute Inpatient Hospital | Attending: Internal Medicine | Admitting: Internal Medicine

## 2015-10-27 ENCOUNTER — Encounter (HOSPITAL_COMMUNITY): Payer: Self-pay | Admitting: General Practice

## 2015-10-27 DIAGNOSIS — I5032 Chronic diastolic (congestive) heart failure: Secondary | ICD-10-CM | POA: Diagnosis present

## 2015-10-27 DIAGNOSIS — I2510 Atherosclerotic heart disease of native coronary artery without angina pectoris: Secondary | ICD-10-CM | POA: Diagnosis present

## 2015-10-27 DIAGNOSIS — I69354 Hemiplegia and hemiparesis following cerebral infarction affecting left non-dominant side: Secondary | ICD-10-CM | POA: Diagnosis not present

## 2015-10-27 DIAGNOSIS — C50912 Malignant neoplasm of unspecified site of left female breast: Secondary | ICD-10-CM | POA: Diagnosis present

## 2015-10-27 DIAGNOSIS — L8990 Pressure ulcer of unspecified site, unspecified stage: Secondary | ICD-10-CM | POA: Insufficient documentation

## 2015-10-27 DIAGNOSIS — Z888 Allergy status to other drugs, medicaments and biological substances status: Secondary | ICD-10-CM

## 2015-10-27 DIAGNOSIS — I272 Other secondary pulmonary hypertension: Secondary | ICD-10-CM | POA: Diagnosis present

## 2015-10-27 DIAGNOSIS — Z953 Presence of xenogenic heart valve: Secondary | ICD-10-CM | POA: Diagnosis not present

## 2015-10-27 DIAGNOSIS — Z79811 Long term (current) use of aromatase inhibitors: Secondary | ICD-10-CM | POA: Diagnosis not present

## 2015-10-27 DIAGNOSIS — K219 Gastro-esophageal reflux disease without esophagitis: Secondary | ICD-10-CM | POA: Diagnosis present

## 2015-10-27 DIAGNOSIS — E785 Hyperlipidemia, unspecified: Secondary | ICD-10-CM | POA: Diagnosis present

## 2015-10-27 DIAGNOSIS — E119 Type 2 diabetes mellitus without complications: Secondary | ICD-10-CM

## 2015-10-27 DIAGNOSIS — Z881 Allergy status to other antibiotic agents status: Secondary | ICD-10-CM | POA: Diagnosis not present

## 2015-10-27 DIAGNOSIS — J45909 Unspecified asthma, uncomplicated: Secondary | ICD-10-CM | POA: Diagnosis present

## 2015-10-27 DIAGNOSIS — J189 Pneumonia, unspecified organism: Principal | ICD-10-CM | POA: Diagnosis present

## 2015-10-27 DIAGNOSIS — M797 Fibromyalgia: Secondary | ICD-10-CM | POA: Diagnosis present

## 2015-10-27 DIAGNOSIS — Z883 Allergy status to other anti-infective agents status: Secondary | ICD-10-CM | POA: Diagnosis not present

## 2015-10-27 DIAGNOSIS — R54 Age-related physical debility: Secondary | ICD-10-CM | POA: Diagnosis present

## 2015-10-27 DIAGNOSIS — Z885 Allergy status to narcotic agent status: Secondary | ICD-10-CM | POA: Diagnosis not present

## 2015-10-27 DIAGNOSIS — I130 Hypertensive heart and chronic kidney disease with heart failure and stage 1 through stage 4 chronic kidney disease, or unspecified chronic kidney disease: Secondary | ICD-10-CM | POA: Diagnosis present

## 2015-10-27 DIAGNOSIS — I4891 Unspecified atrial fibrillation: Secondary | ICD-10-CM | POA: Diagnosis present

## 2015-10-27 DIAGNOSIS — I480 Paroxysmal atrial fibrillation: Secondary | ICD-10-CM | POA: Diagnosis present

## 2015-10-27 DIAGNOSIS — D631 Anemia in chronic kidney disease: Secondary | ICD-10-CM | POA: Diagnosis present

## 2015-10-27 DIAGNOSIS — Z923 Personal history of irradiation: Secondary | ICD-10-CM | POA: Diagnosis not present

## 2015-10-27 DIAGNOSIS — J90 Pleural effusion, not elsewhere classified: Secondary | ICD-10-CM | POA: Diagnosis not present

## 2015-10-27 DIAGNOSIS — Z886 Allergy status to analgesic agent status: Secondary | ICD-10-CM | POA: Diagnosis not present

## 2015-10-27 DIAGNOSIS — Z7982 Long term (current) use of aspirin: Secondary | ICD-10-CM

## 2015-10-27 DIAGNOSIS — E039 Hypothyroidism, unspecified: Secondary | ICD-10-CM | POA: Diagnosis present

## 2015-10-27 DIAGNOSIS — E1122 Type 2 diabetes mellitus with diabetic chronic kidney disease: Secondary | ICD-10-CM | POA: Diagnosis present

## 2015-10-27 DIAGNOSIS — J918 Pleural effusion in other conditions classified elsewhere: Secondary | ICD-10-CM | POA: Diagnosis present

## 2015-10-27 DIAGNOSIS — I471 Supraventricular tachycardia, unspecified: Secondary | ICD-10-CM | POA: Diagnosis present

## 2015-10-27 DIAGNOSIS — Z9012 Acquired absence of left breast and nipple: Secondary | ICD-10-CM

## 2015-10-27 DIAGNOSIS — Z9221 Personal history of antineoplastic chemotherapy: Secondary | ICD-10-CM

## 2015-10-27 DIAGNOSIS — E876 Hypokalemia: Secondary | ICD-10-CM | POA: Diagnosis present

## 2015-10-27 DIAGNOSIS — Z800 Family history of malignant neoplasm of digestive organs: Secondary | ICD-10-CM | POA: Diagnosis not present

## 2015-10-27 DIAGNOSIS — K279 Peptic ulcer, site unspecified, unspecified as acute or chronic, without hemorrhage or perforation: Secondary | ICD-10-CM | POA: Diagnosis present

## 2015-10-27 DIAGNOSIS — Z95810 Presence of automatic (implantable) cardiac defibrillator: Secondary | ICD-10-CM | POA: Diagnosis not present

## 2015-10-27 DIAGNOSIS — N183 Chronic kidney disease, stage 3 (moderate): Secondary | ICD-10-CM | POA: Diagnosis present

## 2015-10-27 DIAGNOSIS — Z8249 Family history of ischemic heart disease and other diseases of the circulatory system: Secondary | ICD-10-CM | POA: Diagnosis not present

## 2015-10-27 DIAGNOSIS — I472 Ventricular tachycardia: Secondary | ICD-10-CM

## 2015-10-27 DIAGNOSIS — I4721 Torsades de pointes: Secondary | ICD-10-CM

## 2015-10-27 DIAGNOSIS — Z903 Acquired absence of stomach [part of]: Secondary | ICD-10-CM

## 2015-10-27 DIAGNOSIS — I10 Essential (primary) hypertension: Secondary | ICD-10-CM | POA: Diagnosis present

## 2015-10-27 DIAGNOSIS — N179 Acute kidney failure, unspecified: Secondary | ICD-10-CM | POA: Diagnosis not present

## 2015-10-27 DIAGNOSIS — C50919 Malignant neoplasm of unspecified site of unspecified female breast: Secondary | ICD-10-CM | POA: Diagnosis present

## 2015-10-27 HISTORY — DX: Pneumonia, unspecified organism: J18.9

## 2015-10-27 LAB — CBC
HEMATOCRIT: 36.7 % (ref 36.0–46.0)
Hemoglobin: 11.2 g/dL — ABNORMAL LOW (ref 12.0–15.0)
MCH: 28.3 pg (ref 26.0–34.0)
MCHC: 30.5 g/dL (ref 30.0–36.0)
MCV: 92.7 fL (ref 78.0–100.0)
PLATELETS: 165 10*3/uL (ref 150–400)
RBC: 3.96 MIL/uL (ref 3.87–5.11)
RDW: 16.7 % — AB (ref 11.5–15.5)
WBC: 13.8 10*3/uL — AB (ref 4.0–10.5)

## 2015-10-27 LAB — CREATININE, SERUM
Creatinine, Ser: 1.67 mg/dL — ABNORMAL HIGH (ref 0.44–1.00)
GFR, EST AFRICAN AMERICAN: 34 mL/min — AB (ref >60)
GFR, EST NON AFRICAN AMERICAN: 30 mL/min — AB (ref >60)

## 2015-10-27 LAB — GLUCOSE, CAPILLARY
GLUCOSE-CAPILLARY: 94 mg/dL (ref 65–99)
Glucose-Capillary: 95 mg/dL (ref 65–99)

## 2015-10-27 LAB — STREP PNEUMONIAE URINARY ANTIGEN: Strep Pneumo Urinary Antigen: NEGATIVE

## 2015-10-27 MED ORDER — SODIUM CHLORIDE 0.9 % IV SOLN
INTRAVENOUS | Status: DC
  Administered 2015-10-27: 21:00:00 via INTRAVENOUS

## 2015-10-27 MED ORDER — PRAMIPEXOLE DIHYDROCHLORIDE 0.25 MG PO TABS
0.2500 mg | ORAL_TABLET | Freq: Every day | ORAL | Status: DC
  Administered 2015-10-27 – 2015-10-30 (×4): 0.25 mg via ORAL
  Filled 2015-10-27 (×6): qty 1

## 2015-10-27 MED ORDER — DEXTROSE 5 % IV SOLN
100.0000 mg | Freq: Two times a day (BID) | INTRAVENOUS | Status: DC
  Administered 2015-10-28 (×3): 100 mg via INTRAVENOUS
  Filled 2015-10-27 (×6): qty 100

## 2015-10-27 MED ORDER — FOLIC ACID 1 MG PO TABS
2.0000 mg | ORAL_TABLET | Freq: Every day | ORAL | Status: DC
  Administered 2015-10-28 – 2015-10-31 (×4): 2 mg via ORAL
  Filled 2015-10-27 (×5): qty 2

## 2015-10-27 MED ORDER — LISINOPRIL 5 MG PO TABS
5.0000 mg | ORAL_TABLET | Freq: Every day | ORAL | Status: DC
  Administered 2015-10-28 – 2015-10-30 (×3): 5 mg via ORAL
  Filled 2015-10-27 (×4): qty 1

## 2015-10-27 MED ORDER — LEVALBUTEROL HCL 0.63 MG/3ML IN NEBU
1.2500 mg | INHALATION_SOLUTION | Freq: Four times a day (QID) | RESPIRATORY_TRACT | Status: DC
  Administered 2015-10-27 – 2015-10-29 (×6): 1.25 mg via RESPIRATORY_TRACT
  Administered 2015-10-29: 0.63 mg via RESPIRATORY_TRACT
  Filled 2015-10-27 (×10): qty 6

## 2015-10-27 MED ORDER — ASPIRIN EC 81 MG PO TBEC
81.0000 mg | DELAYED_RELEASE_TABLET | Freq: Every evening | ORAL | Status: DC
  Administered 2015-10-28 – 2015-10-30 (×3): 81 mg via ORAL
  Filled 2015-10-27 (×3): qty 1

## 2015-10-27 MED ORDER — OXYCODONE-ACETAMINOPHEN 7.5-325 MG PO TABS
1.0000 | ORAL_TABLET | ORAL | Status: DC | PRN
  Administered 2015-10-27 – 2015-10-31 (×8): 1 via ORAL
  Filled 2015-10-27 (×8): qty 1

## 2015-10-27 MED ORDER — SODIUM CHLORIDE 0.9 % IJ SOLN
3.0000 mL | Freq: Two times a day (BID) | INTRAMUSCULAR | Status: DC
  Administered 2015-10-28 – 2015-10-30 (×4): 3 mL via INTRAVENOUS

## 2015-10-27 MED ORDER — METOPROLOL TARTRATE 25 MG PO TABS
25.0000 mg | ORAL_TABLET | Freq: Two times a day (BID) | ORAL | Status: DC
  Administered 2015-10-27 – 2015-10-31 (×6): 25 mg via ORAL
  Filled 2015-10-27 (×8): qty 1

## 2015-10-27 MED ORDER — PIPERACILLIN-TAZOBACTAM 3.375 G IVPB
3.3750 g | Freq: Three times a day (TID) | INTRAVENOUS | Status: DC
  Administered 2015-10-28 – 2015-10-31 (×10): 3.375 g via INTRAVENOUS
  Filled 2015-10-27 (×13): qty 50

## 2015-10-27 MED ORDER — MIRTAZAPINE 15 MG PO TABS
15.0000 mg | ORAL_TABLET | Freq: Every day | ORAL | Status: DC
  Administered 2015-10-27 – 2015-10-30 (×4): 15 mg via ORAL
  Filled 2015-10-27 (×4): qty 1

## 2015-10-27 MED ORDER — FERROUS SULFATE 325 (65 FE) MG PO TABS
325.0000 mg | ORAL_TABLET | Freq: Every day | ORAL | Status: DC
  Administered 2015-10-28 – 2015-10-31 (×4): 325 mg via ORAL
  Filled 2015-10-27 (×4): qty 1

## 2015-10-27 MED ORDER — PIPERACILLIN-TAZOBACTAM 3.375 G IVPB
3.3750 g | INTRAVENOUS | Status: AC
  Administered 2015-10-27: 3.375 g via INTRAVENOUS
  Filled 2015-10-27: qty 50

## 2015-10-27 MED ORDER — LETROZOLE 2.5 MG PO TABS
2.5000 mg | ORAL_TABLET | Freq: Every day | ORAL | Status: DC
  Administered 2015-10-28 – 2015-10-31 (×4): 2.5 mg via ORAL
  Filled 2015-10-27 (×4): qty 1

## 2015-10-27 MED ORDER — PANTOPRAZOLE SODIUM 40 MG PO TBEC: 40.0000 mg | DELAYED_RELEASE_TABLET | Freq: Every day | ORAL | Status: DC | PRN

## 2015-10-27 MED ORDER — MELATONIN 5 MG PO TABS: 10.0000 mg | ORAL_TABLET | Freq: Every day | ORAL | Status: DC

## 2015-10-27 MED ORDER — KETOROLAC TROMETHAMINE 15 MG/ML IJ SOLN
15.0000 mg | Freq: Once | INTRAMUSCULAR | Status: AC
  Administered 2015-10-27: 15 mg via INTRAVENOUS
  Filled 2015-10-27: qty 1

## 2015-10-27 MED ORDER — MAGNESIUM SULFATE 4 GM/100ML IV SOLN
4.0000 g | Freq: Once | INTRAVENOUS | Status: AC
  Administered 2015-10-28: 4 g via INTRAVENOUS
  Filled 2015-10-27 (×2): qty 100

## 2015-10-27 MED ORDER — NITROGLYCERIN 0.4 MG SL SUBL: 0.4000 mg | SUBLINGUAL_TABLET | SUBLINGUAL | Status: DC | PRN

## 2015-10-27 MED ORDER — LEVOTHYROXINE SODIUM 125 MCG PO TABS
125.0000 ug | ORAL_TABLET | Freq: Every day | ORAL | Status: DC
  Administered 2015-10-28 – 2015-10-31 (×4): 125 ug via ORAL
  Filled 2015-10-27 (×4): qty 1

## 2015-10-27 MED ORDER — IPRATROPIUM BROMIDE 0.02 % IN SOLN
0.5000 mg | Freq: Four times a day (QID) | RESPIRATORY_TRACT | Status: DC
  Administered 2015-10-27 – 2015-10-29 (×7): 0.5 mg via RESPIRATORY_TRACT
  Filled 2015-10-27 (×9): qty 2.5

## 2015-10-27 MED ORDER — VANCOMYCIN HCL IN DEXTROSE 750-5 MG/150ML-% IV SOLN
750.0000 mg | Freq: Two times a day (BID) | INTRAVENOUS | Status: DC
  Administered 2015-10-27 – 2015-10-28 (×2): 750 mg via INTRAVENOUS
  Filled 2015-10-27 (×4): qty 150

## 2015-10-27 MED ORDER — MELATONIN 3 MG PO TABS
9.0000 mg | ORAL_TABLET | Freq: Every day | ORAL | Status: DC
  Administered 2015-10-27 – 2015-10-30 (×4): 9 mg via ORAL
  Filled 2015-10-27 (×6): qty 3

## 2015-10-27 MED ORDER — PROMETHAZINE HCL 25 MG/ML IJ SOLN
12.5000 mg | INTRAMUSCULAR | Status: DC | PRN
  Administered 2015-10-30: 12.5 mg via INTRAVENOUS
  Filled 2015-10-27: qty 1

## 2015-10-27 MED ORDER — ENOXAPARIN SODIUM 40 MG/0.4ML ~~LOC~~ SOLN
40.0000 mg | SUBCUTANEOUS | Status: DC
  Administered 2015-10-27: 40 mg via SUBCUTANEOUS
  Filled 2015-10-27: qty 0.4

## 2015-10-27 MED ORDER — SUCRALFATE 1 G PO TABS
1.0000 g | ORAL_TABLET | Freq: Three times a day (TID) | ORAL | Status: DC
Start: 2015-10-27 — End: 2015-10-31
  Administered 2015-10-27 – 2015-10-31 (×15): 1 g via ORAL
  Filled 2015-10-27 (×15): qty 1

## 2015-10-27 MED ORDER — MAGNESIUM OXIDE 400 (241.3 MG) MG PO TABS
400.0000 mg | ORAL_TABLET | Freq: Every day | ORAL | Status: DC
  Administered 2015-10-28 – 2015-10-31 (×4): 400 mg via ORAL
  Filled 2015-10-27 (×4): qty 1

## 2015-10-27 MED ORDER — LORAZEPAM 1 MG PO TABS
2.0000 mg | ORAL_TABLET | Freq: Every day | ORAL | Status: DC
  Administered 2015-10-27 – 2015-10-29 (×3): 2 mg via ORAL
  Filled 2015-10-27 (×4): qty 2

## 2015-10-27 MED ORDER — VANCOMYCIN HCL IN DEXTROSE 1-5 GM/200ML-% IV SOLN
1000.0000 mg | INTRAVENOUS | Status: DC
  Filled 2015-10-27: qty 200

## 2015-10-27 NOTE — Progress Notes (Signed)
[name]:[id number] Admission Data: 10/27/2015 6:36 PM Attending Provider: Annita Brod, MD  UO:[id number], MD Consults/ Treatment Team:    [name] is a 74 y.o. female patient admitted from ED awake, alert  & orientated  X 3,  Prior, VSS - Blood pressure 172/147, pulse 88, temperature 99.2 F (37.3 C), temperature source Oral, resp. rate 22, height 5\' 3"  (1.6 m), weight 76.2 kg (167 lb 15.9 oz), SpO2 90 %., O2    3 L nasal cannular, no c/o shortness of breath, no c/o chest pain, no distress noted.   IV site WDL:  20 right hand with a transparent dsg that's clean dry and intact.  Pt orientation to unit, room and routine. Information packet given to patient and safety video watched.  Admission INP armband ID verified with patient/family, and in place. SR up x 2, fall risk assessment complete with Patient and family verbalizing understanding of risks associated with falls. Pt verbalizes an understanding of how to use the call bell and to call for help before getting out of bed. Skin is dry and flaky. RN noted open scabs scattered on BLE, bruising noted to BUE, sacrum is redden and unblanchable-foam placed for barrier protection.  Alex Gardener RN is the second RN to verify findings. Pt resting in bed at this time. Bed low and locked. Bed alarm on. Call bell within reach.   Will cont to monitor and assist as needed.  Dorita Fray, RN 10/27/2015 6:43 PM

## 2015-10-27 NOTE — Progress Notes (Addendum)
ANTIBIOTIC CONSULT NOTE - INITIAL  Pharmacy Consult for Vanco/Zosyn Indication: pneumonia  Allergies  Allergen Reactions  . Butorphanol Tartrate Other (See Comments)    REACTION: migraines, nervous/agitated  . Levofloxacin Other (See Comments)    Avoid QTc prolonging medications  . Zofran [Ondansetron Hcl] Other (See Comments)    Avoid QTc prolonging medications  . Codeine Nausea And Vomiting  . Lipitor [Atorvastatin Calcium] Diarrhea and Other (See Comments)    nightmares  . Revatio [Sildenafil Citrate] Swelling  . Keflex [Cephalexin] Other (See Comments)    Listed as allergy at [facility], but tolerated cefepime here.    Patient Measurements: Height: 5\' 3"  (160 cm) Weight: 167 lb 15.9 oz (76.2 kg) IBW/kg (Calculated) : 52.4 Adjusted Body Weight:   Vital Signs: Temp: 99.2 F (37.3 C) (12/16 1816) Temp Source: Oral (12/16 1816) BP: 172/147 mmHg (12/16 1816) Pulse Rate: 88 (12/16 1816) Intake/Output from previous day:   Intake/Output from this shift:    Labs: No results for input(s): WBC, HGB, PLT, LABCREA, CREATININE in the last 72 hours. CrCl cannot be calculated (Patient has no serum creatinine result on file.). No results for input(s): VANCOTROUGH, VANCOPEAK, VANCORANDOM, GENTTROUGH, GENTPEAK, GENTRANDOM, TOBRATROUGH, TOBRAPEAK, TOBRARND, AMIKACINPEAK, AMIKACINTROU, AMIKACIN in the last 72 hours.   Microbiology: No results found for this or any previous visit (from the past 720 hour(s)).  Medical History: Past Medical History  Diagnosis Date  . Pulmonary hypertension     s/p RHC 4/10 - PAP 86/29, wedge 21-23  . Gastric ulcer with hemorrhage   . Rheumatic fever     Childhood  . Coronary artery disease     Nonobstructive, LVEF 55%  . Mitral regurgitation     a. s/p bioprosthetic MVR  . Tricuspid regurgitation     a. s/p TV repair  . Hypertension   . Stroke     a. 10/2010 with residual L sided weakness  . Asthma     PT. ON OXYGEN 2 L/[place]   . Diabetes  mellitus   . Hypothyroidism   . Anemia, pernicious 1979  . Hepatitis     PT. STATES SHE HAD IT WHEN SHE WAS 14 YRS. OLD  . Arthritis   . GERD (gastroesophageal reflux disease)   . Fibromyalgia   . Squamous cell carcinoma     Left arm  . Breast cancer     2008 s/p chemo and radiation (last tx 4/09) s/p left mastectomy s/p 9 lymph node resection - [name], [place]  . Atrial fibrillation     a. s/p MAZE  . 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 AICD (automatic cardioverter/defibrillator) present   . CKD (chronic kidney disease), stage III   . Cellulitis and abscess of leg 05/01/2015    LEFT LEG    Medications:  Prescriptions prior to admission  Medication Sig Dispense Refill Last Dose  . letrozole (FEMARA) 2.5 MG tablet Take 2.5 mg by mouth daily.     . rosuvastatin (CRESTOR) 20 MG tablet Take 1 tablet (20 mg total) by mouth daily at 6 PM. 30 tablet 0 10/27/2015 at Unknown time  . sucralfate (CARAFATE) 1 G tablet Take 1 g by mouth 4 (four) times daily -  with meals and at bedtime.     Marland Kitchen albuterol (PROVENTIL HFA;VENTOLIN HFA) 108 (90 BASE) MCG/ACT inhaler Inhale 1 puff into the lungs every 6 (six) hours as needed for wheezing or shortness of breath.   Past Month at Unknown time  .  aspirin EC 81 MG tablet Take 81 mg by mouth every evening.   05/02/2015 at Unknown time  . cholecalciferol (VITAMIN D) 1000 UNITS tablet Take 1,000 Units by mouth daily.   05/02/2015 at Unknown time  . cyanocobalamin (,VITAMIN B-12,) 1000 MCG/ML injection Inject 1,000 mcg into the muscle every 30 (thirty) days. Takes on 23rd or 24th each month   Past Month at Unknown time  . doxycycline (VIBRAMYCIN) 100 MG capsule Take 1 capsule (100 mg total) by mouth 2 (two) times daily. 12 capsule 0   . ferrous sulfate 325 (65 FE) MG tablet Take 1 tablet (325 mg total) by mouth daily with breakfast. 30 tablet 0   . folic acid (FOLVITE) 1 MG tablet Take 2 mg by mouth daily.    05/02/2015  at Unknown time  . levothyroxine (SYNTHROID, LEVOTHROID) 125 MCG tablet Take 125 mcg by mouth daily before breakfast.   05/02/2015 at Unknown time  . lisinopril (PRINIVIL,ZESTRIL) 5 MG tablet Take 2.5 mg by mouth daily.   05/02/2015 at Unknown time  . LORazepam (ATIVAN) 1 MG tablet Take 2 mg by mouth at bedtime.   3 05/01/2015 at Unknown time  . magnesium oxide (MAG-OX) 400 (241.3 MG) MG tablet Take 400 mg by mouth daily.  3 05/01/2015 at Unknown time  . Melatonin 5 MG TABS Take 10 mg by mouth at bedtime.   05/01/2015 at Unknown time  . metoprolol tartrate (LOPRESSOR) 25 MG tablet Take 25 mg by mouth 2 (two) times daily.   05/02/2015 at 0900  . mirtazapine (REMERON) 15 MG tablet Take 15 mg by mouth at bedtime.    05/01/2015 at Unknown time  . nitroGLYCERIN (NITROSTAT) 0.4 MG SL tablet Place 0.4 mg under the tongue every 5 (five) minutes as needed for chest pain.   prn  . oxyCODONE-acetaminophen (PERCOCET) 7.5-325 MG per tablet Take 1 tablet by mouth every 4 (four) hours as needed for moderate pain.    05/02/2015 at 1300  . pantoprazole (PROTONIX) 40 MG tablet Take 40 mg by mouth daily as needed (for heartburn).   Past Week at Unknown time  . pramipexole (MIRAPEX) 0.25 MG tablet Take 0.25 mg by mouth. Take 1 tablet by mouth at 4 PM and 1 at bedtime.  3 Past Week at Unknown time  . spironolactone (ALDACTONE) 25 MG tablet Take 0.5 tablets (12.5 mg total) by mouth daily. 30 tablet 6   . torsemide (DEMADEX) 20 MG tablet Take 1 tablet (20 mg total) by mouth 2 (two) times daily. (Patient taking differently: Take 40 mg by mouth 2 (two) times daily. ) 60 tablet 0 05/02/2015 at Unknown time   Assessment: 74 y/o F with extensive PMH transferred from [facility] of [place], [place] due to B CAP complicated with a loculated right sided pleural effusion. Also has B LE cellulits. Labs: Scr 1.35, K=3.7, Tbili 1.26, Mg 1.5, WBC 9.5, H/H 9.3/30.8, Plts 138, flu screen was negative.   ID:  B CAP complicated with a  loculated right sided pleural effusion. Also has B LE cellulits. MAR from OSH indicates last doses on 12/15 but no MAR for 12/16. Calculated CrCl 45.  Levaquin 12/15 (2116)>> Vanco 1g 12/15 (0019)>> Flagyl 12/15 (0014)>>  Goal of Therapy:  Vancomycin trough level 15-20 mcg/ml  Plan:  Zosyn 3.375g IV q8hr. Vancomycin 750mg  IV q12h (last dose unknown today). Trough after 3-5 doses at steady state. D/c Doxycycline??    Khristian Phillippi S. Alford Highland, PharmD, [place] Clinical Staff Pharmacist Pager [phone]  Alford Highland,  Larrie Lucia Stillinger 10/27/2015,8:03 PM

## 2015-10-27 NOTE — H&P (Signed)
[facility] History and Physical  [name] [id number] DOB: 25-Oct-1941 DOA: 10/27/2015  Referring physician: Verlee Rossetti, MD PCP: Burton Apley, MD   Chief Complaint: Pneumonia  HPI: [name] is a 74 y.o. female with a past medical history as below transferred from [facility] of [place], [place] due to CAP complicated with a loculated right sided pleural effusion. She was admitted yesterday to that facility due to having a week history of progressively worse dyspnea, productive cough, fatigue, orthopnea, decreased appetite and worsening lower extremity edema. She denies chest pain, palpitations, dizziness, diaphoresis or PND. She was treated with Levaquin and vancomycin. The patient is being transferred for cardiothoracic surgery evaluation. She is currently in no acute distress.   Review of Systems:  Constitutional:  Positive chills, fatigue.  No weight loss, night sweats, Fevers,  HEENT:  No headaches, Difficulty swallowing,Tooth/dental problems,Sore throat,  No sneezing, itching, ear ache, nasal congestion, post nasal drip,  Cardio-vascular:  No chest pain, Orthopnea, PND, swelling in lower extremities, anasarca, dizziness, palpitations  GI:  No heartburn, indigestion, abdominal pain, nausea, vomiting, diarrhea, change in bowel habits, loss of appetite  Resp:  No shortness of breath with exertion or at rest. No excess mucus, no productive cough, No non-productive cough, No coughing up of blood.No change in color of mucus.No wheezing.No chest wall deformity  Skin:  no rash or lesions.  GU:  no dysuria, change in color of urine, no urgency or frequency. No flank pain.  Musculoskeletal:  No joint pain or swelling. No decreased range of motion. No back pain.  Psych:  No change in mood or affect. No depression or anxiety. No memory loss.   Past Medical History  Diagnosis Date  . Pulmonary hypertension     s/p RHC 4/10 - PAP 86/29, wedge 21-23  .  Gastric ulcer with hemorrhage   . Rheumatic fever     Childhood  . Coronary artery disease     Nonobstructive, LVEF 55%  . Mitral regurgitation     a. s/p bioprosthetic MVR  . Tricuspid regurgitation     a. s/p TV repair  . Hypertension   . Stroke     a. 10/2010 with residual L sided weakness  . Asthma     PT. ON OXYGEN 2 L/[place]   . Diabetes mellitus   . Hypothyroidism   . Anemia, pernicious 1979  . Hepatitis     PT. STATES SHE HAD IT WHEN SHE WAS 14 YRS. OLD  . Arthritis   . GERD (gastroesophageal reflux disease)   . Fibromyalgia   . Squamous cell carcinoma     Left arm  . Breast cancer     2008 s/p chemo and radiation (last tx 4/09) s/p left mastectomy s/p 9 lymph node resection - [name], [place] [facility]  . Atrial fibrillation     a. s/p MAZE  . 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 AICD (automatic cardioverter/defibrillator) present   . CKD (chronic kidney disease), stage III   . Cellulitis and abscess of leg 05/01/2015    LEFT LEG   Past Surgical History  Procedure Laterality Date  . Partial gastrectomy    . Tonsillectomy    . Mastectomy  2008    LEFT  . Maze  09/24/2011    Procedure: MAZE (complete biatrial lesion set using RF and cryo with clipping of LA appendage);  Surgeon: Rexene Alberts, MD;  Location: [place];  Service: Open Heart Surgery;  Laterality: N/A;  . Mitral valve replacement  09/24/2011    Procedure: MITRAL VALVE (MV) REPLACEMENT (#60mm Medtronic Mosaic bioprosthetic tissue valve);  Surgeon: Rexene Alberts, MD;  Location: [place];  Service: Open Heart Surgery;  Laterality: N/A;  . Tricuspid valvuloplasty  09/24/2011    #87mm Edwards mc3 ring annuloplasty  . Left and right heart catheterization with coronary angiogram N/A 03/30/2013    Procedure: LEFT AND RIGHT HEART CATHETERIZATION WITH CORONARY ANGIOGRAM;  Surgeon: Peter M Martinique, MD;  Location: [facility] CATH LAB;  Service: Cardiovascular;  Laterality: N/A;  . Implantable  cardioverter defibrillator implant N/A 03/31/2013    MDT Evalyn Casco DR implanted by Dr Lovena Le for secondary prevention   Social History:  reports that she has never smoked. She has never used smokeless tobacco. She reports that she does not drink alcohol or use illicit drugs.  Allergies  Allergen Reactions  . Butorphanol Tartrate Other (See Comments)    REACTION: migraines, nervous/agitated  . Levofloxacin Other (See Comments)    Avoid QTc prolonging medications  . Zofran [Ondansetron Hcl] Other (See Comments)    Avoid QTc prolonging medications  . Codeine Nausea And Vomiting  . Lipitor [Atorvastatin Calcium] Diarrhea and Other (See Comments)    nightmares  . Revatio [Sildenafil Citrate] Swelling  . Keflex [Cephalexin] Other (See Comments)    Listed as allergy at [facility], but tolerated cefepime here.    Family History  Problem Relation Age of Onset  . Heart disease Mother   . Colon cancer Mother      Prior to Admission medications   Medication Sig Start Date End Date Taking? Authorizing Provider  letrozole (FEMARA) 2.5 MG tablet Take 2.5 mg by mouth daily.   Yes Historical Provider, MD  rosuvastatin (CRESTOR) 20 MG tablet Take 1 tablet (20 mg total) by mouth daily at 6 PM. 04/07/15  Yes Nita Sells, MD  sucralfate (CARAFATE) 1 G tablet Take 1 g by mouth 4 (four) times daily -  with meals and at bedtime.   Yes Historical Provider, MD  albuterol (PROVENTIL HFA;VENTOLIN HFA) 108 (90 BASE) MCG/ACT inhaler Inhale 1 puff into the lungs every 6 (six) hours as needed for wheezing or shortness of breath.    Historical Provider, MD  aspirin EC 81 MG tablet Take 81 mg by mouth every evening.    Historical Provider, MD  cholecalciferol (VITAMIN D) 1000 UNITS tablet Take 1,000 Units by mouth daily.    Historical Provider, MD  cyanocobalamin (,VITAMIN B-12,) 1000 MCG/ML injection Inject 1,000 mcg into the muscle every 30 (thirty) days. Takes on 23rd or 24th each month    Historical  Provider, MD  doxycycline (VIBRAMYCIN) 100 MG capsule Take 1 capsule (100 mg total) by mouth 2 (two) times daily. 05/05/15   Kelvin Cellar, MD  ferrous sulfate 325 (65 FE) MG tablet Take 1 tablet (325 mg total) by mouth daily with breakfast. 04/07/15   Nita Sells, MD  folic acid (FOLVITE) 1 MG tablet Take 2 mg by mouth daily.     Historical Provider, MD  levothyroxine (SYNTHROID, LEVOTHROID) 125 MCG tablet Take 125 mcg by mouth daily before breakfast.    Historical Provider, MD  lisinopril (PRINIVIL,ZESTRIL) 5 MG tablet Take 2.5 mg by mouth daily.    Historical Provider, MD  LORazepam (ATIVAN) 1 MG tablet Take 2 mg by mouth at bedtime.  03/13/15   Historical Provider, MD  magnesium oxide (MAG-OX) 400 (241.3 MG) MG tablet Take 400 mg by mouth daily. 03/28/15  Historical Provider, MD  Melatonin 5 MG TABS Take 10 mg by mouth at bedtime.    Historical Provider, MD  metoprolol tartrate (LOPRESSOR) 25 MG tablet Take 25 mg by mouth 2 (two) times daily.    Historical Provider, MD  mirtazapine (REMERON) 15 MG tablet Take 15 mg by mouth at bedtime.  06/19/13   Historical Provider, MD  nitroGLYCERIN (NITROSTAT) 0.4 MG SL tablet Place 0.4 mg under the tongue every 5 (five) minutes as needed for chest pain.    Historical Provider, MD  oxyCODONE-acetaminophen (PERCOCET) 7.5-325 MG per tablet Take 1 tablet by mouth every 4 (four) hours as needed for moderate pain.  03/05/15   Historical Provider, MD  pantoprazole (PROTONIX) 40 MG tablet Take 40 mg by mouth daily as needed (for heartburn).    Historical Provider, MD  pramipexole (MIRAPEX) 0.25 MG tablet Take 0.25 mg by mouth. Take 1 tablet by mouth at 4 PM and 1 at bedtime. 01/23/15   Historical Provider, MD  spironolactone (ALDACTONE) 25 MG tablet Take 0.5 tablets (12.5 mg total) by mouth daily. 04/07/15   Nita Sells, MD  torsemide (DEMADEX) 20 MG tablet Take 1 tablet (20 mg total) by mouth 2 (two) times daily. Patient taking differently: Take 40 mg by  mouth 2 (two) times daily.  04/07/15   Nita Sells, MD   Physical Exam: Filed Vitals:   10/27/15 1816  BP: 172/147  Pulse: 88  Temp: 99.2 F (37.3 C)  TempSrc: Oral  Resp: 22  Height: 5\' 3"  (1.6 m)  Weight: 76.2 kg (167 lb 15.9 oz)  SpO2: 90%    Wt Readings from Last 3 Encounters:  10/27/15 76.2 kg (167 lb 15.9 oz)  05/04/15 67.8 kg (149 lb 7.6 oz)  04/07/15 65.545 kg (144 lb 8 oz)    General:  Appears chronically ill. Eyes: PERRL, normal lids, irises & conjunctiva ENT: grossly normal hearing, lips & tongue mildly dry Neck: no LAD, masses or thyromegaly Cardiovascular: RRR,  Positive systolic murmur ,  2+ LE edema. Telemetry: Respiratory:  Decreased breath sounds bilaterally, right more than left. Abdomen: soft, ntnd Skin: no rash or induration seen on limited exam Musculoskeletal: grossly normal tone BUE/BLE Psychiatric: grossly normal mood and affect, speech fluent and appropriate Neurologic: grossly non-focal.   10/27/2015  WBC 9.5 H&H 9.3/30.8 Platelets 138. Sodium 140 Potassium 3.7 Glucose 94 BUN 21 Creatinine 1.35 Albumin 3.0  Please see printed results from [place], [place].   Echocardiogram 03/09/2015 LV EF: 55% -  60%  ------------------------------------------------------------------- Indications:   CHF - 428.0.  ------------------------------------------------------------------- History:  PMH: SVT. Mitral valve replacement. ICD. TR. Rheumatic heart disease. Dyspnea. Atrial fibrillation. Stroke. Bacteremia.  ------------------------------------------------------------------- Study Conclusions  - Left ventricle: The cavity size was normal. Wall thickness was normal. Systolic function was normal. The estimated ejection fraction was in the range of 55% to 60%. Images were inadequate for LV wall motion assessment. Doppler parameters are consistent with high ventricular filling pressure. - Ventricular septum: Septal  motion showed paradox. - Aortic valve: Trileaflet; mildly calcified leaflets. There was trivial regurgitation. Valve area (VTI): 1.89 cm^2. Valve area (Vmax): 1.58 cm^2. Valve area (Vmean): 1.83 cm^2. - Mitral valve: A bioprosthesis was present and functioning normally. - Left atrium: The atrium was mildly to moderately dilated. - Right ventricle: The cavity size was moderately dilated. Systolic function was moderately reduced. - Tricuspid valve: There was moderate regurgitation. - Pulmonic valve: There was mild regurgitation. - Pulmonary arteries: PA peak pressure: 63 mm Hg (S).  Assessment/Plan Principal Problem:   CAP (community acquired pneumonia)   Loculated pleural effusion Admit to telemetry. Continue supplemental oxygen  Continue IV antibiotics. I will defer the use of Levaquin, given the patient's extensive arrhythmia history. The patient most likely will likely need VATS due to loculation .  Bronchodilators as needed.  Active Problems:   SVT (supraventricular tachycardia) (HCC)   Atrial fibrillation (HCC)   Torsades de pointes (HCC) Continue metoprolol 25 mg by mouth twice a day. Continue oral magnesium. Prophylactic magnesium sulfate IVPB Monitor potassium and magnesium closely. Supplement electrolytes as needed. Levalbuterol nebulizer treatments.      Hypothyroidism Continue levothyroxine. Given history of tachyarrhythmias, I will check TSH in the morning.    HTN (hypertension) Continue metoprolol and lisinopril. Monitor blood pressure closely.    DM type 2 (diabetes mellitus, type 2) ([place]) The patient has decreased oral intake. Monitor CBG. Regular insulin sliding scale if blood glucose rises.    Breast cancer ([place]) Continue daily Femara.   CC was called and they recommended cardiothoracic surgery evaluation due to loculation. Dr. Roxan Hockey was in surgery, but took consult information through nursing staff.  Code Status: Full  code. DVT Prophylaxis: Lovenox SQ. Family Communication:  Disposition Plan: Admit to continue IV antibiotic therapy and cardiothoracic evaluation in the morning.   Time spent: Over 90 minutes were spent during the process of this admission including, but not limited to direct patient contact, review of outside medical records, coordination of care with other providers, admission orders and documentation.  Reubin Milan [facility] Pager [phone]-

## 2015-10-27 NOTE — Progress Notes (Signed)
[facility] admission paged and made aware that pt has arrived to unit. Dorita Fray 10/27/2015 6:46 PM

## 2015-10-28 ENCOUNTER — Encounter (HOSPITAL_COMMUNITY): Payer: Self-pay

## 2015-10-28 ENCOUNTER — Inpatient Hospital Stay (HOSPITAL_COMMUNITY): Payer: Medicare Other

## 2015-10-28 DIAGNOSIS — J90 Pleural effusion, not elsewhere classified: Secondary | ICD-10-CM

## 2015-10-28 DIAGNOSIS — L8990 Pressure ulcer of unspecified site, unspecified stage: Secondary | ICD-10-CM | POA: Insufficient documentation

## 2015-10-28 DIAGNOSIS — I10 Essential (primary) hypertension: Secondary | ICD-10-CM

## 2015-10-28 DIAGNOSIS — E039 Hypothyroidism, unspecified: Secondary | ICD-10-CM

## 2015-10-28 DIAGNOSIS — I480 Paroxysmal atrial fibrillation: Secondary | ICD-10-CM

## 2015-10-28 LAB — COMPREHENSIVE METABOLIC PANEL
ALT: 9 U/L — AB (ref 14–54)
AST: 16 U/L (ref 15–41)
Albumin: 2.3 g/dL — ABNORMAL LOW (ref 3.5–5.0)
Alkaline Phosphatase: 68 U/L (ref 38–126)
Anion gap: 23 — ABNORMAL HIGH (ref 5–15)
BUN: 21 mg/dL — ABNORMAL HIGH (ref 6–20)
CHLORIDE: 90 mmol/L — AB (ref 101–111)
CO2: 23 mmol/L (ref 22–32)
CREATININE: 1.84 mg/dL — AB (ref 0.44–1.00)
Calcium: 8.3 mg/dL — ABNORMAL LOW (ref 8.9–10.3)
GFR, EST AFRICAN AMERICAN: 30 mL/min — AB (ref >60)
GFR, EST NON AFRICAN AMERICAN: 26 mL/min — AB (ref >60)
Glucose, Bld: 132 mg/dL — ABNORMAL HIGH (ref 65–99)
POTASSIUM: 3.3 mmol/L — AB (ref 3.5–5.1)
SODIUM: 136 mmol/L (ref 135–145)
Total Bilirubin: 1.2 mg/dL (ref 0.3–1.2)
Total Protein: 7.9 g/dL (ref 6.5–8.1)

## 2015-10-28 LAB — CBC WITH DIFFERENTIAL/PLATELET
BASOS ABS: 0 10*3/uL (ref 0.0–0.1)
Basophils Relative: 0 %
EOS ABS: 0.1 10*3/uL (ref 0.0–0.7)
EOS PCT: 1 %
HCT: 29.3 % — ABNORMAL LOW (ref 36.0–46.0)
Hemoglobin: 9.2 g/dL — ABNORMAL LOW (ref 12.0–15.0)
Lymphocytes Relative: 13 %
Lymphs Abs: 1.3 10*3/uL (ref 0.7–4.0)
MCH: 28.8 pg (ref 26.0–34.0)
MCHC: 31.4 g/dL (ref 30.0–36.0)
MCV: 91.8 fL (ref 78.0–100.0)
MONO ABS: 1 10*3/uL (ref 0.1–1.0)
Monocytes Relative: 10 %
Neutro Abs: 7.6 10*3/uL (ref 1.7–7.7)
Neutrophils Relative %: 76 %
PLATELETS: 126 10*3/uL — AB (ref 150–400)
RBC: 3.19 MIL/uL — AB (ref 3.87–5.11)
RDW: 16.5 % — AB (ref 11.5–15.5)
WBC: 10 10*3/uL (ref 4.0–10.5)

## 2015-10-28 LAB — GLUCOSE, CAPILLARY
GLUCOSE-CAPILLARY: 76 mg/dL (ref 65–99)
GLUCOSE-CAPILLARY: 82 mg/dL (ref 65–99)
Glucose-Capillary: 109 mg/dL — ABNORMAL HIGH (ref 65–99)
Glucose-Capillary: 88 mg/dL (ref 65–99)

## 2015-10-28 LAB — MAGNESIUM: MAGNESIUM: 3.5 mg/dL — AB (ref 1.7–2.4)

## 2015-10-28 LAB — PHOSPHORUS: PHOSPHORUS: 3.7 mg/dL (ref 2.5–4.6)

## 2015-10-28 LAB — POTASSIUM: Potassium: 3.5 mmol/L (ref 3.5–5.1)

## 2015-10-28 MED ORDER — POTASSIUM CHLORIDE CRYS ER 20 MEQ PO TBCR
40.0000 meq | EXTENDED_RELEASE_TABLET | Freq: Two times a day (BID) | ORAL | Status: DC
  Administered 2015-10-28: 40 meq via ORAL
  Filled 2015-10-28: qty 2

## 2015-10-28 MED ORDER — POTASSIUM CHLORIDE CRYS ER 20 MEQ PO TBCR
40.0000 meq | EXTENDED_RELEASE_TABLET | Freq: Every day | ORAL | Status: DC
  Administered 2015-10-28: 40 meq via ORAL
  Filled 2015-10-28: qty 2

## 2015-10-28 MED ORDER — VANCOMYCIN HCL IN DEXTROSE 750-5 MG/150ML-% IV SOLN
750.0000 mg | INTRAVENOUS | Status: DC
  Administered 2015-10-29 – 2015-10-30 (×2): 750 mg via INTRAVENOUS
  Filled 2015-10-28 (×3): qty 150

## 2015-10-28 MED ORDER — TORSEMIDE 20 MG PO TABS
40.0000 mg | ORAL_TABLET | Freq: Two times a day (BID) | ORAL | Status: DC
  Administered 2015-10-28 – 2015-10-30 (×5): 40 mg via ORAL
  Filled 2015-10-28 (×6): qty 2

## 2015-10-28 MED ORDER — ENOXAPARIN SODIUM 30 MG/0.3ML ~~LOC~~ SOLN
30.0000 mg | SUBCUTANEOUS | Status: DC
  Administered 2015-10-28 – 2015-10-30 (×3): 30 mg via SUBCUTANEOUS
  Filled 2015-10-28 (×3): qty 0.3

## 2015-10-28 MED ORDER — SPIRONOLACTONE 25 MG PO TABS
12.5000 mg | ORAL_TABLET | Freq: Every day | ORAL | Status: DC
  Administered 2015-10-28 – 2015-10-29 (×2): 12.5 mg via ORAL
  Filled 2015-10-28 (×4): qty 1

## 2015-10-28 NOTE — Progress Notes (Signed)
PATIENT DETAILS Name: [name] Age: 74 y.o. Sex: female Date of Birth: 1940/12/12 Admit Date: 10/27/2015 Admitting Physician Annita Brod, MD [id number], MD  Subjective: Feels better  Assessment/Plan: Principal Problem: PNA with loculated pleural effusion:Continue empiric IV Abx, await CTVS recommendations.Leukocytosis decreasing, afebrile and clinically somewhat improved. Follow cultures.  Active Problems: Chronic diastolic congestive heart failure:compensated,resume Diuretics,continue Metoprolol.Last Echo 03/09/15 EF 55-60%. Daily weights.  Stage 3 [id number] close to usual baseline. Follow  Essential [id number] Metoprolol/Lisinopril-resuming Diuretics.Follow  Hypokalemia:start KCL 40 mEq BID-given prior hx of VT/VF from Hypokalemia-recheck K later today. Follow closely  Hx of Asthma:stable-continue bronchodilators  Hx of PAF:s/p MAZE procedure 2012-reviewed last cards note on 04/07/15-not on anticoagulation due to falls  H/o VT/VF with QT prolongation:AICD in place, will attempt to Keep K> 4.0 (see above)  Hx of non obstructive CAD (Last LHC 03/12/11):stable-continue ASA/Statin/Metoprolol  Hypothyroidism:continue synthroid  Hyperlipidemia:continue crestor  Anemia:secondary to CKD/Chronic disease-Hb stable-follow  PUD s/p partial gastrectomy:continue PPI/carafate  L breast CA s/p mastectomy/chemo/XRT 8/08:continue Letrozole  Hx of Rheumatic heart disease-s/p bioprosthetic mitral valve replacement, tricuspid valve repair 2012  Hx of PAH: not able to tolerate Revatio in the past  Hx of CVA  Disposition: Remain inpatient  Antimicrobial agents  See below  Anti-infectives    Start     Dose/Rate Route Frequency Ordered Stop   10/29/15 1146  vancomycin (VANCOCIN) IVPB 750 mg/150 ml premix     750 mg 150 mL/hr over 60 Minutes Intravenous Every 24 hours 10/28/15 1304     10/28/15 0600  piperacillin-tazobactam  (ZOSYN) IVPB 3.375 g     3.375 g 12.5 mL/hr over 240 Minutes Intravenous 3 times per day 10/27/15 2123     10/27/15 2200  vancomycin (VANCOCIN) IVPB 750 mg/150 ml premix  Status:  Discontinued     750 mg 150 mL/hr over 60 Minutes Intravenous Every 12 hours 10/27/15 2123 10/28/15 1304   10/27/15 2100  doxycycline (VIBRAMYCIN) 100 mg in dextrose 5 % 250 mL IVPB     100 mg 125 mL/hr over 120 Minutes Intravenous Every 12 hours 10/27/15 1951     10/27/15 2015  piperacillin-tazobactam (ZOSYN) IVPB 3.375 g     3.375 g 12.5 mL/hr over 240 Minutes Intravenous NOW 10/27/15 2007 10/28/15 0357   10/27/15 2015  vancomycin (VANCOCIN) IVPB 1000 mg/200 mL premix  Status:  Discontinued     1,000 mg 200 mL/hr over 60 Minutes Intravenous NOW 10/27/15 2007 10/27/15 2122      DVT Prophylaxis: Prophylactic Lovenox   Code Status: Full code   Family Communication None  Procedures: None  CONSULTS:  CTVS  Time spent 40 minutes-Greater than 50% of this time was spent in counseling, explanation of diagnosis, planning of further management, and coordination of care.  MEDICATIONS: Scheduled Meds: . aspirin EC  81 mg Oral QPM  . doxycycline (VIBRAMYCIN) IV  100 mg Intravenous Q12H  . enoxaparin (LOVENOX) injection  40 mg Subcutaneous Q24H  . ferrous sulfate  325 mg Oral Q breakfast  . folic acid  2 mg Oral Daily  . ipratropium  0.5 mg Nebulization Q6H  . letrozole  2.5 mg Oral Daily  . levalbuterol  1.25 mg Nebulization Q6H  . levothyroxine  125 mcg Oral QAC breakfast  . lisinopril  5 mg Oral Daily  . LORazepam  2 mg Oral QHS  . magnesium oxide  400 mg Oral Daily  . Melatonin  9 mg Oral QHS  . metoprolol tartrate  25 mg Oral BID  . mirtazapine  15 mg Oral QHS  . piperacillin-tazobactam (ZOSYN)  IV  3.375 g Intravenous 3 times per day  . potassium chloride  40 mEq Oral Daily  . pramipexole  0.25 mg Oral QHS  . sodium chloride  3 mL Intravenous Q12H  . sucralfate  1 g Oral TID WC & HS  .  [START ON 10/29/2015] vancomycin  750 mg Intravenous Q24H   Continuous Infusions: . sodium chloride 10 mL/hr at 10/27/15 2041   PRN Meds:.nitroGLYCERIN, oxyCODONE-acetaminophen, pantoprazole, promethazine    PHYSICAL EXAM: Vital signs in last 24 hours: Filed Vitals:   10/27/15 1816 10/27/15 2141 10/28/15 0523  BP: 172/147 139/64 126/54  Pulse: 88 72 117  Temp: 99.2 F (37.3 C) 98.8 F (37.1 C) 97.6 F (36.4 C)  TempSrc: Oral Oral Oral  Resp: 22 16 19   Height: 5\' 3"  (1.6 m)    Weight: 76.2 kg (167 lb 15.9 oz)    SpO2: 90% 96% 100%    Weight change:  Filed Weights   10/27/15 1816  Weight: 76.2 kg (167 lb 15.9 oz)   Body mass index is 29.77 kg/(m^2).   Gen Exam: Awake and alert with clear speech.   Neck: Supple, No JVD.   Chest: B/L Clear.   CVS: S1 S2 Regular Abdomen: soft, BS +, non tender, non distended.  Extremities: trace edema, lower extremities warm to touch. Neurologic: Non Focal.   Skin: No Rash.   Wounds: N/A.    Intake/Output from previous day:  Intake/Output Summary (Last 24 hours) at 10/28/15 1320 Last data filed at 10/28/15 0648  Gross per 24 hour  Intake    890 ml  Output     50 ml  Net    840 ml     LAB RESULTS: CBC  Recent Labs Lab 10/27/15 2100 10/28/15 0505  WBC 13.8* 10.0  HGB 11.2* 9.2*  HCT 36.7 29.3*  PLT 165 126*  MCV 92.7 91.8  MCH 28.3 28.8  MCHC 30.5 31.4  RDW 16.7* 16.5*  LYMPHSABS  --  1.3  MONOABS  --  1.0  EOSABS  --  0.1  BASOSABS  --  0.0    Chemistries   Recent Labs Lab 10/27/15 2100 10/28/15 0505 10/28/15 0610  NA  --  136  --   K  --  3.3*  --   CL  --  90*  --   CO2  --  23  --   GLUCOSE  --  132*  --   BUN  --  21*  --   CREATININE 1.67* 1.84*  --   CALCIUM  --  8.3*  --   MG  --   --  3.5*    CBG:  Recent Labs Lab 10/27/15 1816 10/27/15 2142 10/28/15 0808 10/28/15 1211  GLUCAP 94 95 76 109*    GFR Estimated Creatinine Clearance: 27 mL/min (by C-G formula based on Cr of  1.84).  Coagulation profile No results for input(s): INR, PROTIME in the last 168 hours.  Cardiac Enzymes No results for input(s): CKMB, TROPONINI, MYOGLOBIN in the last 168 hours.  Invalid input(s): CK  Invalid input(s): POCBNP No results for input(s): DDIMER in the last 72 hours. No results for input(s): HGBA1C in the last 72 hours. No results for input(s): CHOL, HDL, LDLCALC, TRIG, CHOLHDL, LDLDIRECT in the last 72 hours. No results for input(s): TSH, T4TOTAL, T3FREE, THYROIDAB in  the last 72 hours.  Invalid input(s): FREET3 No results for input(s): VITAMINB12, FOLATE, FERRITIN, TIBC, IRON, RETICCTPCT in the last 72 hours. No results for input(s): LIPASE, AMYLASE in the last 72 hours.  Urine Studies No results for input(s): UHGB, CRYS in the last 72 hours.  Invalid input(s): UACOL, UAPR, USPG, UPH, UTP, UGL, UKET, UBIL, UNIT, UROB, ULEU, UEPI, UWBC, URBC, UBAC, CAST, UCOM, BILUA  MICROBIOLOGY: Recent Results (from the past 240 hour(s))  Culture, blood (routine x 2) Call MD if unable to obtain prior to antibiotics being given     Status: None (Preliminary result)   Collection Time: 10/27/15  8:56 PM  Result Value Ref Range Status   Specimen Description BLOOD RIGHT HAND  Final   Special Requests BOTTLES DRAWN AEROBIC AND ANAEROBIC 2.5CC  Final   Culture NO GROWTH < 24 HOURS  Final   Report Status PENDING  Incomplete  Culture, blood (routine x 2) Call MD if unable to obtain prior to antibiotics being given     Status: None (Preliminary result)   Collection Time: 10/27/15  8:56 PM  Result Value Ref Range Status   Specimen Description BLOOD RIGHT HAND  Final   Special Requests BOTTLES DRAWN AEROBIC ONLY 2.5CC  Final   Culture NO GROWTH < 24 HOURS  Final   Report Status PENDING  Incomplete    RADIOLOGY STUDIES/RESULTS: Dg Chest 2 View  10/28/2015  CLINICAL DATA:  Shortness of breath and cough. EXAM: CHEST - 2 VIEW COMPARISON:  04/04/2015 FINDINGS: The heart size and  mediastinal contours are within normal limits. Stable appearance of pacemaker. The right lung volume is extremely low with potentially a component of right lower lung consolidation. No pulmonary edema or pleural fluid identified. No pneumothorax. Stable cardiac enlargement. The visualized skeletal structures are unremarkable. IMPRESSION: Low right lung volume with potential right lower lung consolidation/pneumonia. Electronically Signed   By: Aletta Edouard M.D.   On: 10/28/2015 12:54    Oren Binet, MD  [facility] Pager:[phone]  If 7PM-7AM, please contact night-coverage [email or web address] Password [email or web address] 10/28/2015, 1:20 PM   LOS: 1 day

## 2015-10-28 NOTE — Progress Notes (Addendum)
ANTIBIOTIC CONSULT NOTE - INITIAL  Pharmacy Consult for Vanco/Zosyn Indication: pneumonia  Allergies  Allergen Reactions  . Butorphanol Tartrate Other (See Comments)    REACTION: migraines, nervous/agitated  . Levofloxacin Other (See Comments)    Avoid QTc prolonging medications  . Zofran [Ondansetron Hcl] Other (See Comments)    Avoid QTc prolonging medications  . Codeine Nausea And Vomiting  . Lipitor [Atorvastatin Calcium] Diarrhea and Other (See Comments)    nightmares  . Revatio [Sildenafil Citrate] Swelling  . Keflex [Cephalexin] Other (See Comments)    Listed as allergy at [facility], but tolerated cefepime here.    Patient Measurements: Height: 5\' 3"  (160 cm) Weight: 167 lb 15.9 oz (76.2 kg) IBW/kg (Calculated) : 52.4 Adjusted Body Weight:   Vital Signs: Temp: 97.6 [name] (36.4 C) (12/17 0523) Temp Source: Oral (12/17 0523) BP: 126/54 mmHg (12/17 0523) Pulse Rate: 117 (12/17 0523) Intake/Output from previous day: 12/16 0701 - 12/17 0700 In: 890 [P.O.:240; I.V.:100; IV Piggyback:550] Out: 50 [Urine:50] Intake/Output from this shift:    Labs:  Recent Labs  10/27/15 2100 10/28/15 0505  WBC 13.8* 10.0  HGB 11.2* 9.2*  PLT 165 126*  CREATININE 1.67* 1.84*   Estimated Creatinine Clearance: 27 mL/min (by C-G formula based on Cr of 1.84). No results for input(s): VANCOTROUGH, VANCOPEAK, VANCORANDOM, GENTTROUGH, GENTPEAK, GENTRANDOM, TOBRATROUGH, TOBRAPEAK, TOBRARND, AMIKACINPEAK, AMIKACINTROU, AMIKACIN in the last 72 hours.   Microbiology: Recent Results (from the past 720 hour(s))  Culture, blood (routine x 2) Call MD if unable to obtain prior to antibiotics being given     Status: None (Preliminary result)   Collection Time: 10/27/15  8:56 PM  Result Value Ref Range Status   Specimen Description BLOOD RIGHT HAND  Final   Special Requests BOTTLES DRAWN AEROBIC AND ANAEROBIC 2.5CC  Final   Culture NO GROWTH < 24 HOURS  Final   Report Status PENDING   Incomplete  Culture, blood (routine x 2) Call MD if unable to obtain prior to antibiotics being given     Status: None (Preliminary result)   Collection Time: 10/27/15  8:56 PM  Result Value Ref Range Status   Specimen Description BLOOD RIGHT HAND  Final   Special Requests BOTTLES DRAWN AEROBIC ONLY 2.5CC  Final   Culture NO GROWTH < 24 HOURS  Final   Report Status PENDING  Incomplete   Medications:  Prescriptions prior to admission  Medication Sig Dispense Refill Last Dose  . letrozole (FEMARA) 2.5 MG tablet Take 2.5 mg by mouth daily.     . rosuvastatin (CRESTOR) 20 MG tablet Take 1 tablet (20 mg total) by mouth daily at 6 PM. 30 tablet 0 10/27/2015 at Unknown time  . sucralfate (CARAFATE) 1 G tablet Take 1 g by mouth 4 (four) times daily -  with meals and at bedtime.     Marland Kitchen albuterol (PROVENTIL HFA;VENTOLIN HFA) 108 (90 BASE) MCG/ACT inhaler Inhale 1 puff into the lungs every 6 (six) hours as needed for wheezing or shortness of breath.   Past Month at Unknown time  . aspirin EC [name] MG tablet Take [name] mg by mouth every evening.   05/02/2015 at Unknown time  . cholecalciferol (VITAMIN D) 1000 UNITS tablet Take 1,000 Units by mouth daily.   05/02/2015 at Unknown time  . cyanocobalamin (,VITAMIN B-12,) 1000 MCG/ML injection Inject 1,000 mcg into the muscle every 30 (thirty) days. Takes on 23rd or 24th each month   Past Month at Unknown time  . doxycycline (VIBRAMYCIN) 100 MG  capsule Take 1 capsule (100 mg total) by mouth 2 (two) times daily. 12 capsule 0   . ferrous sulfate 325 (65 FE) MG tablet Take 1 tablet (325 mg total) by mouth daily with breakfast. 30 tablet 0   . folic acid (FOLVITE) 1 MG tablet Take 2 mg by mouth daily.    05/02/2015 at Unknown time  . levothyroxine (SYNTHROID, LEVOTHROID) 125 MCG tablet Take 125 mcg by mouth daily before breakfast.   05/02/2015 at Unknown time  . lisinopril (PRINIVIL,ZESTRIL) 5 MG tablet Take 2.5 mg by mouth daily.   05/02/2015 at Unknown time  . LORazepam  (ATIVAN) 1 MG tablet Take 2 mg by mouth at bedtime.   3 05/01/2015 at Unknown time  . magnesium oxide (MAG-OX) 400 (241.3 MG) MG tablet Take 400 mg by mouth daily.  3 05/01/2015 at Unknown time  . Melatonin 5 MG TABS Take 10 mg by mouth at bedtime.   05/01/2015 at Unknown time  . metoprolol tartrate (LOPRESSOR) 25 MG tablet Take 25 mg by mouth 2 (two) times daily.   05/02/2015 at 0900  . mirtazapine (REMERON) 15 MG tablet Take 15 mg by mouth at bedtime.    05/01/2015 at Unknown time  . nitroGLYCERIN (NITROSTAT) 0.4 MG SL tablet Place 0.4 mg under the tongue every 5 (five) minutes as needed for chest pain.   prn  . oxyCODONE-acetaminophen (PERCOCET) 7.5-325 MG per tablet Take 1 tablet by mouth every 4 (four) hours as needed for moderate pain.    05/02/2015 at 1300  . pantoprazole (PROTONIX) 40 MG tablet Take 40 mg by mouth daily as needed (for heartburn).   Past Week at Unknown time  . pramipexole (MIRAPEX) 0.25 MG tablet Take 0.25 mg by mouth. Take 1 tablet by mouth at 4 PM and 1 at bedtime.  3 Past Week at Unknown time  . spironolactone (ALDACTONE) 25 MG tablet Take 0.5 tablets (12.5 mg total) by mouth daily. 30 tablet 6   . torsemide (DEMADEX) 20 MG tablet Take 1 tablet (20 mg total) by mouth 2 (two) times daily. (Patient taking differently: Take 40 mg by mouth 2 (two) times daily. ) 60 tablet 0 05/02/2015 at Unknown time   Assessment: 74 y/o [name] with extensive PMH transferred from [facility] [place], [place] due to B CAP complicated with a loculated right sided pleural effusion. Received abx at outside facility. Also has B LE cellulits.  Scr 1.84 renal function worsening, WBC 10.0, afeb   Levaquin 12/15 (2116) Flagyl 12/15 (0014) Vanco 1g 12/15 (0019)>> Zosyn 12/16>> Doxy 12/16>>  Goal of Therapy:  Vancomycin trough level 15-20 mcg/ml  Plan:  D/t renal fxn worsening decease Vancomycin to 750mg  IV q24h Continue Zosyn 3.375g IV q8hr. Will continue to follow renal function, culture  results, LOT, and antibiotic de-escalation plans    Darl Pikes, PharmD Clinical Pharmacist- Resident Pager: [phone]  Darl Pikes 10/28/2015,1:04 PM

## 2015-10-28 NOTE — Evaluation (Signed)
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   Clinical Impression  Patient presents with functional limitations due to deficits listed in PT problem list (see below). Pt with generalized weakness, lethargy, fatigue, and DOE impacting mobility. Mobility assessment limited due to fatigue, SOB and drop in Sp02 to low 80s on 4L/min 02. Cues for pursed lip breathing. Tolerated side stepping along side bed with Min guard for safety. Will follow acutely to maximize independence and mobility prior to return home. Has support from spouse at home.    Follow Up Recommendations Home health PT;Supervision/Assistance - 24 hour    Equipment Recommendations  None recommended by PT    Recommendations for Other Services OT consult     Precautions / Restrictions Precautions Precautions: Fall Precaution Comments: monitor 02 Restrictions Weight Bearing Restrictions: No      Mobility  Bed Mobility Overal bed mobility: Needs Assistance Bed Mobility: Supine to Sit;Sit to Supine     Supine to sit: Supervision;HOB elevated Sit to supine: Supervision   General bed mobility comments: Cues to use rail for support. DOE. Able to bring BLEs into bed without assist.   Transfers Overall transfer level: Needs assistance Equipment used: Rolling walker (2 wheeled) Transfers: Sit to/from Stand Sit to Stand: Min assist         General transfer comment: Min A to boost from EOB with cues for hand placement/technique and anterior translation.  Ambulation/Gait Ambulation/Gait assistance: Min  guard Ambulation Distance (Feet): 3 Feet Assistive device: Rolling walker (2 wheeled) Gait Pattern/deviations: Step-to pattern;Decreased stride length;Trunk flexed Gait velocity: decreased   General Gait Details: Able to take a few steps forward x2 and sideways with RW for support. DOE 3/4 Sp02 dropped to low 80s on 4L/min 02.  Stairs            Wheelchair Mobility    Modified Rankin (Stroke Patients Only)       Balance Overall balance assessment: Needs assistance Sitting-balance support: Feet supported;No upper extremity supported Sitting balance-Leahy Scale: Fair     Standing balance support: During functional activity Standing balance-Leahy Scale: Poor Standing balance comment: Relient on RW for support.                              Pertinent Vitals/Pain Pain Assessment: No/denies pain    Home Living Family/patient expects to be discharged to:: Private residence Living Arrangements: Spouse/significant other Available Help at Discharge: Family;Available 24 hours/day Type of Home: House Home Access: Level entry     Home Layout: One level Home Equipment: Walker - 4 wheels      Prior Function Level of Independence: Independent with assistive device(s)         Comments: Uses the rollator in the community. Independent for household ambulation     Hand Dominance   Dominant Hand: Right    Extremity/Trunk Assessment   Upper Extremity Assessment: Defer to OT evaluation           Lower Extremity Assessment: Generalized weakness      Cervical / Trunk Assessment: Kyphotic  Communication   Communication: HOH  Cognition  Arousal/Alertness: Lethargic Behavior During Therapy: WFL for tasks assessed/performed Overall Cognitive Status: Within Functional Limits for tasks assessed                      General Comments General comments (skin integrity, edema, etc.): Pt very lethargic-- reports having not slept in 3 nights due to not  having her sleeping medications.    Exercises General Exercises - Lower Extremity Ankle Circles/Pumps: Both;10 reps;Supine Long Arc Quad: Both;5 reps;Seated      Assessment/Plan    PT Assessment Patient needs continued PT services  PT Diagnosis Difficulty walking;Generalized weakness   PT Problem List Decreased strength;Cardiopulmonary status limiting activity;Decreased activity tolerance;Decreased balance;Decreased mobility  PT Treatment Interventions Balance training;Gait training;Functional mobility training;Therapeutic exercise;Therapeutic activities;Patient/family education   PT Goals (Current goals can be found in the Care Plan section) Acute Rehab PT Goals Patient Stated Goal: to be able to breathe PT Goal Formulation: With patient Time For Goal Achievement: 11/11/15 Potential to Achieve Goals: Fair    Frequency Min 3X/week   Barriers to discharge Decreased caregiver support      Co-evaluation               End of Session Equipment Utilized During Treatment: Oxygen Activity Tolerance: Treatment limited secondary to medical complications (Comment) (drop in Sp02) Patient left: in bed;with call bell/phone within reach;with bed alarm set Nurse Communication: Mobility status         Time: 1541-1600 PT Time Calculation (min) (ACUTE ONLY): 19 min   Charges:   PT Evaluation $Initial PT Evaluation Tier I: 1 Procedure     PT G Codes:        [name] 10/28/2015, 4:20 PM Wray Kearns, [place], DPT [phone]

## 2015-10-28 NOTE — Consult Note (Signed)
Reason for Consult:Loculated pleural effusion Referring Physician: Dr. [name] is an 74 y.o. female.  HPI: 74 yo woman transferred from [facility] for evaluation of pleural effusion  [name] is a 74 yo woman with a complex medical history who presented to the hospital in [place] with cc/o fever, chills, productive cough, shortness of breath and swelling in her legs. She was diagnosed with pneumonia and started on antibiotics. A CT was done and showed RLL consolidation and a loculated pleural effusion, with some component of fluid in the fissure.  She was transferred to [facility] for further management.  She now feels better than she did on admission. She has been afebrile since arrival and is not in respiratory distress.  Records from [place] reviewed, Unable to find copy of CT chest   Past Medical History  Diagnosis Date  . Pulmonary hypertension (HCC)     s/p RHC 4/10 - PAP 86/29, wedge 21-23  . Gastric ulcer with hemorrhage   . Rheumatic fever     Childhood  . Coronary artery disease     Nonobstructive, LVEF 55%  . Mitral regurgitation     a. s/p bioprosthetic MVR  . Tricuspid regurgitation     a. s/p TV repair  . Hypertension   . Stroke [facility])     a. 10/2010 with residual L sided weakness  . Asthma     PT. ON OXYGEN 2 L/   . Diabetes mellitus   . Hypothyroidism   . Anemia, pernicious 1979  . Hepatitis     PT. STATES SHE HAD IT WHEN SHE WAS 14 YRS. OLD  . Arthritis   . GERD (gastroesophageal reflux disease)   . Fibromyalgia   . Squamous cell carcinoma (HCC)     Left arm  . Breast cancer ([place])     2008 s/p chemo and radiation (last tx 4/09) s/p left mastectomy s/p 9 lymph node resection - [name], [place]  . Atrial fibrillation ([place])     a. s/p MAZE  . 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 AICD (automatic cardioverter/defibrillator) present   . CKD (chronic kidney disease), stage III   .  Cellulitis and abscess of leg 05/01/2015    LEFT LEG  . Pneumonia   . CAP (community acquired pneumonia) 10/27/2015    Past Surgical History  Procedure Laterality Date  . Partial gastrectomy    . Tonsillectomy    . Mastectomy  2008    LEFT  . Maze  09/24/2011    Procedure: MAZE (complete biatrial lesion set using RF and cryo with clipping of LA appendage);  Surgeon: Rexene Alberts, MD;  Location: [place];  Service: Open Heart Surgery;  Laterality: N/A;  . Mitral valve replacement  09/24/2011    Procedure: MITRAL VALVE (MV) REPLACEMENT (#31m Medtronic Mosaic bioprosthetic tissue valve);  Surgeon: CRexene Alberts MD;  [place]  Service: Open Heart Surgery;  Laterality: N/A;  . Tricuspid valvuloplasty  09/24/2011    #[id number] [id number] ring annuloplasty  . Left and right heart catheterization with coronary angiogram N/A 03/30/2013    Procedure: LEFT AND RIGHT HEART CATHETERIZATION WITH CORONARY ANGIOGRAM;  Surgeon: Peter M JoMartiniqueMD;  [facility];  Service: Cardiovascular;  Laterality: N/A;  . Implantable cardioverter defibrillator implant N/A 03/31/2013    MDT EvEvalyn CascoR implanted by Dr TaLovena Leor secondary prevention  . Cardiac valve replacement      Family History  Problem Relation Age of Onset  . Heart disease Mother   . Colon cancer Mother     Social History:  reports that she has never smoked. She has never used smokeless tobacco. She reports that she does not drink alcohol or use illicit drugs.  Allergies:  Allergies  Allergen Reactions  . Butorphanol Tartrate Other (See Comments)    REACTION: migraines, nervous/agitated  . Levofloxacin Other (See Comments)    Avoid QTc prolonging medications  . Zofran [Ondansetron Hcl] Other (See Comments)    Avoid QTc prolonging medications  . Codeine Nausea And Vomiting  . Lipitor [Atorvastatin Calcium] Diarrhea and Other (See Comments)    nightmares  . Revatio [Sildenafil Citrate] Swelling  . Keflex [Cephalexin]  Other (See Comments)    Listed as allergy at [facility], but tolerated cefepime here.    Medications:  Scheduled: . aspirin EC  81 mg Oral QPM  . doxycycline (VIBRAMYCIN) IV  100 mg Intravenous Q12H  . enoxaparin (LOVENOX) injection  40 mg Subcutaneous Q24H  . ferrous sulfate  325 mg Oral Q breakfast  . folic acid  2 mg Oral Daily  . ipratropium  0.5 mg Nebulization Q6H  . letrozole  2.5 mg Oral Daily  . levalbuterol  1.25 mg Nebulization Q6H  . levothyroxine  125 mcg Oral QAC breakfast  . lisinopril  5 mg Oral Daily  . LORazepam  2 mg Oral QHS  . magnesium oxide  400 mg Oral Daily  . Melatonin  9 mg Oral QHS  . metoprolol tartrate  25 mg Oral BID  . mirtazapine  15 mg Oral QHS  . piperacillin-tazobactam (ZOSYN)  IV  3.375 g Intravenous 3 times per day  . potassium chloride  40 mEq Oral Daily  . pramipexole  0.25 mg Oral QHS  . sodium chloride  3 mL Intravenous Q12H  . sucralfate  1 g Oral TID WC & HS  . vancomycin  750 mg Intravenous Q12H    Results for orders placed or performed during the hospital encounter of 10/27/15 (from the past 48 hour(s))  Glucose, capillary     Status: None   Collection Time: 10/27/15  6:16 PM  Result Value Ref Range   Glucose-Capillary 94 65 - 99 mg/dL  Culture, blood (routine x 2) Call MD if unable to obtain prior to antibiotics being given     Status: None (Preliminary result)   Collection Time: 10/27/15  8:56 PM  Result Value Ref Range   Specimen Description BLOOD RIGHT HAND    Special Requests BOTTLES DRAWN AEROBIC AND ANAEROBIC 2.5CC    Culture NO GROWTH < 24 HOURS    Report Status PENDING   Culture, blood (routine x 2) Call MD if unable to obtain prior to antibiotics being given     Status: None (Preliminary result)   Collection Time: 10/27/15  8:56 PM  Result Value Ref Range   Specimen Description BLOOD RIGHT HAND    Special Requests BOTTLES DRAWN AEROBIC ONLY 2.5CC    Culture NO GROWTH < 24 HOURS    Report Status PENDING   CBC      Status: Abnormal   Collection Time: 10/27/15  9:00 PM  Result Value Ref Range   WBC 13.8 (H) 4.0 - 10.5 K/uL   RBC 3.96 3.87 - 5.11 MIL/uL   Hemoglobin 11.2 (L) 12.0 - 15.0 g/dL   HCT 36.7 36.0 - 46.0 %   MCV 92.7 78.0 - 100.0 fL   MCH 28.3 26.0 - 34.0  pg   MCHC 30.5 30.0 - 36.0 g/dL   RDW 16.7 (H) 11.5 - 15.5 %   Platelets 165 150 - 400 K/uL  Creatinine, serum     Status: Abnormal   Collection Time: 10/27/15  9:00 PM  Result Value Ref Range   Creatinine, Ser 1.67 (H) 0.44 - 1.00 mg/dL   GFR calc non Af Amer 30 (L) >60 mL/min   GFR calc Af Amer 34 (L) >60 mL/min    Comment: (NOTE) The eGFR has been calculated using the CKD EPI equation. This calculation has not been validated in all clinical situations. eGFR's persistently <60 mL/min signify possible Chronic Kidney Disease.   Glucose, capillary     Status: None   Collection Time: 10/27/15  9:42 PM  Result Value Ref Range   Glucose-Capillary 95 65 - 99 mg/dL   Comment 1 Notify RN    Comment 2 Document in Chart   Strep pneumoniae urinary antigen     Status: None   Collection Time: 10/27/15 10:10 PM  Result Value Ref Range   Strep Pneumo Urinary Antigen NEGATIVE NEGATIVE    Comment:        Infection due to S. pneumoniae cannot be absolutely ruled out since the antigen present may be below the detection limit of the test.   CBC WITH DIFFERENTIAL     Status: Abnormal   Collection Time: 10/28/15  5:05 AM  Result Value Ref Range   WBC 10.0 4.0 - 10.5 K/uL   RBC 3.19 (L) 3.87 - 5.11 MIL/uL   Hemoglobin 9.2 (L) 12.0 - 15.0 g/dL   HCT 29.3 (L) 36.0 - 46.0 %   MCV 91.8 78.0 - 100.0 fL   MCH 28.8 26.0 - 34.0 pg   MCHC 31.4 30.0 - 36.0 g/dL   RDW 16.5 (H) 11.5 - 15.5 %   Platelets 126 (L) 150 - 400 K/uL   Neutrophils Relative % 76 %   Neutro Abs 7.6 1.7 - 7.7 K/uL   Lymphocytes Relative 13 %   Lymphs Abs 1.3 0.7 - 4.0 K/uL   Monocytes Relative 10 %   Monocytes Absolute 1.0 0.1 - 1.0 K/uL   Eosinophils Relative 1 %    Eosinophils Absolute 0.1 0.0 - 0.7 K/uL   Basophils Relative 0 %   Basophils Absolute 0.0 0.0 - 0.1 K/uL  Comprehensive metabolic panel     Status: Abnormal   Collection Time: 10/28/15  5:05 AM  Result Value Ref Range   Sodium 136 135 - 145 mmol/L   Potassium 3.3 (L) 3.5 - 5.1 mmol/L   Chloride 90 (L) 101 - 111 mmol/L   CO2 23 22 - 32 mmol/L   Glucose, Bld 132 (H) 65 - 99 mg/dL   BUN 21 (H) 6 - 20 mg/dL   Creatinine, Ser 1.84 (H) 0.44 - 1.00 mg/dL   Calcium 8.3 (L) 8.9 - 10.3 mg/dL   Total Protein 7.9 6.5 - 8.1 g/dL   Albumin 2.3 (L) 3.5 - 5.0 g/dL   AST 16 15 - 41 U/L   ALT 9 (L) 14 - 54 U/L   Alkaline Phosphatase 68 38 - 126 U/L   Total Bilirubin 1.2 0.3 - 1.2 mg/dL   GFR calc non Af Amer 26 (L) >60 mL/min   GFR calc Af Amer 30 (L) >60 mL/min    Comment: (NOTE) The eGFR has been calculated using the CKD EPI equation. This calculation has not been validated in all clinical situations. eGFR's persistently <60 mL/min  signify possible Chronic Kidney Disease.    Anion gap 23 (H) 5 - 15  Phosphorus     Status: None   Collection Time: 10/28/15  5:05 AM  Result Value Ref Range   Phosphorus 3.7 2.5 - 4.6 mg/dL  Magnesium     Status: Abnormal   Collection Time: 10/28/15  6:10 AM  Result Value Ref Range   Magnesium 3.5 (H) 1.7 - 2.4 mg/dL  Glucose, capillary     Status: Abnormal   Collection Time: 10/28/15 12:11 PM  Result Value Ref Range   Glucose-Capillary 109 (H) 65 - 99 mg/dL   Comment 1 Notify RN     No results found.  Review of Systems  Constitutional: Positive for fever, chills and malaise/fatigue.  HENT: Positive for congestion.   Respiratory: Positive for cough, sputum production and shortness of breath.   Cardiovascular: Negative for chest pain.  Gastrointestinal: Negative for abdominal pain and blood in stool.  Musculoskeletal: Positive for myalgias.  Neurological: Negative.   Endo/Heme/Allergies: Bruises/bleeds easily.   Blood pressure 126/54, pulse 117,  temperature 97.6 F (36.4 C), temperature source Oral, resp. rate 19, height 5' 3"  (1.6 m), weight 167 lb 15.9 oz (76.2 kg), SpO2 100 %. Physical Exam  Vitals reviewed. Constitutional: She is oriented to person, place, and time.  Elderly, frail  HENT:  Head: Normocephalic and atraumatic.  Mouth/Throat: No oropharyngeal exudate.  Eyes: EOM are normal. Pupils are equal, round, and reactive to light. No scleral icterus.  Neck: Neck supple. No tracheal deviation present.  Cardiovascular:  Slightly irregular rhythm, 3/6 murmur with systolic and diastolic components  Respiratory:  Diminished but equal BS bilaterally, no wheezing  GI: Soft. There is no tenderness.  Musculoskeletal: She exhibits edema (1+).  Lymphadenopathy:    She has no cervical adenopathy.  Neurological: She is alert and oriented to person, place, and time. No cranial nerve deficit.  Generally weak without focal deficit  Skin: Skin is warm and dry.    Assessment/Plan: 74 yo woman with multiple severe medical problems who by report has a loculated pleural effusion. By any account she is a poor candidate for any surgical procedure with her valvular heart disease, heart failure, pulmonary hypertension, arrhythmias and severe deconditioning.  She is currently stable clinically, afebrile, normal WBC and not in respiratory distress.  I cannot render an opinion regarding her pleural effusion based on a written report.  I have ordered a CXR to get an idea of what we are dealing with in her case. I was not able to locate a copy of her CT with her records from [place]. If a copy can be located I will need to review it. If not she will need another CT  Melrose Nakayama 10/28/2015, 12:15 PM

## 2015-10-28 NOTE — Progress Notes (Signed)
   10/28/15 1800  Clinical Encounter Type  Visited With Patient  Visit Type Psychological support;Spiritual support;Social support  Referral From Nurse  Consult/Referral To Chaplain  Spiritual Encounters  Spiritual Needs Emotional   Pt. Ask if Chaplain could come at a later time due to the fact that she wanted to rest. Please page chaplain if Pt. Is in need of support.

## 2015-10-29 ENCOUNTER — Inpatient Hospital Stay (HOSPITAL_COMMUNITY): Payer: Medicare Other

## 2015-10-29 DIAGNOSIS — E785 Hyperlipidemia, unspecified: Secondary | ICD-10-CM

## 2015-10-29 DIAGNOSIS — J90 Pleural effusion, not elsewhere classified: Secondary | ICD-10-CM

## 2015-10-29 LAB — CBC WITH DIFFERENTIAL/PLATELET
BASOS ABS: 0.1 10*3/uL (ref 0.0–0.1)
BASOS PCT: 1 %
Eosinophils Absolute: 0.5 10*3/uL (ref 0.0–0.7)
Eosinophils Relative: 6 %
HEMATOCRIT: 30.4 % — AB (ref 36.0–46.0)
HEMOGLOBIN: 9.4 g/dL — AB (ref 12.0–15.0)
LYMPHS PCT: 15 %
Lymphs Abs: 1.2 10*3/uL (ref 0.7–4.0)
MCH: 28.4 pg (ref 26.0–34.0)
MCHC: 30.9 g/dL (ref 30.0–36.0)
MCV: 91.8 fL (ref 78.0–100.0)
MONO ABS: 0.8 10*3/uL (ref 0.1–1.0)
Monocytes Relative: 10 %
NEUTROS ABS: 5.1 10*3/uL (ref 1.7–7.7)
NEUTROS PCT: 68 %
Platelets: 132 10*3/uL — ABNORMAL LOW (ref 150–400)
RBC: 3.31 MIL/uL — AB (ref 3.87–5.11)
RDW: 16.7 % — ABNORMAL HIGH (ref 11.5–15.5)
WBC: 7.6 10*3/uL (ref 4.0–10.5)

## 2015-10-29 LAB — GLUCOSE, CAPILLARY
GLUCOSE-CAPILLARY: 84 mg/dL (ref 65–99)
Glucose-Capillary: 102 mg/dL — ABNORMAL HIGH (ref 65–99)

## 2015-10-29 LAB — COMPREHENSIVE METABOLIC PANEL
ALBUMIN: 2.7 g/dL — AB (ref 3.5–5.0)
ALK PHOS: 69 U/L (ref 38–126)
ALT: 8 U/L — AB (ref 14–54)
AST: 24 U/L (ref 15–41)
Anion gap: 8 (ref 5–15)
BILIRUBIN TOTAL: 0.9 mg/dL (ref 0.3–1.2)
BUN: 27 mg/dL — AB (ref 6–20)
CALCIUM: 9.4 mg/dL (ref 8.9–10.3)
CHLORIDE: 101 mmol/L (ref 101–111)
CO2: 27 mmol/L (ref 22–32)
CREATININE: 1.95 mg/dL — AB (ref 0.44–1.00)
GFR, EST AFRICAN AMERICAN: 28 mL/min — AB (ref >60)
GFR, EST NON AFRICAN AMERICAN: 24 mL/min — AB (ref >60)
Glucose, Bld: 98 mg/dL (ref 65–99)
Potassium: 4.8 mmol/L (ref 3.5–5.1)
Sodium: 136 mmol/L (ref 135–145)
Total Protein: 6.5 g/dL (ref 6.5–8.1)

## 2015-10-29 LAB — MAGNESIUM: Magnesium: 2.4 mg/dL (ref 1.7–2.4)

## 2015-10-29 MED ORDER — IPRATROPIUM BROMIDE 0.02 % IN SOLN
0.5000 mg | Freq: Three times a day (TID) | RESPIRATORY_TRACT | Status: DC
  Administered 2015-10-30 – 2015-10-31 (×3): 0.5 mg via RESPIRATORY_TRACT
  Filled 2015-10-29 (×5): qty 2.5

## 2015-10-29 MED ORDER — LEVALBUTEROL HCL 0.63 MG/3ML IN NEBU: 0.6300 mg | INHALATION_SOLUTION | Freq: Four times a day (QID) | RESPIRATORY_TRACT | Status: DC | PRN

## 2015-10-29 MED ORDER — POTASSIUM CHLORIDE CRYS ER 20 MEQ PO TBCR
20.0000 meq | EXTENDED_RELEASE_TABLET | Freq: Two times a day (BID) | ORAL | Status: DC
  Administered 2015-10-29 – 2015-10-30 (×4): 20 meq via ORAL
  Filled 2015-10-29 (×5): qty 1

## 2015-10-29 MED ORDER — WHITE PETROLATUM GEL
Status: AC
  Filled 2015-10-29: qty 1

## 2015-10-29 MED ORDER — LEVALBUTEROL HCL 0.63 MG/3ML IN NEBU
1.2500 mg | INHALATION_SOLUTION | Freq: Three times a day (TID) | RESPIRATORY_TRACT | Status: DC
  Administered 2015-10-30: 0.63 mg via RESPIRATORY_TRACT
  Administered 2015-10-30: 1.25 mg via RESPIRATORY_TRACT
  Administered 2015-10-31: 0.63 mg via RESPIRATORY_TRACT
  Filled 2015-10-29 (×7): qty 6

## 2015-10-29 NOTE — Progress Notes (Addendum)
       [address]       [place] [address]             [phone]      [name] feels well today. She thinks her breathing is better  BP 110/50 mmHg  Pulse 69  Temp(Src) 98.7 F (37.1 C) (Oral)  Resp 20  Ht 5\' 3"  (1.6 m)  Wt 168 lb 3.4 oz (76.3 kg)  BMI 29.80 kg/m2  SpO2 96%  CBC    Component Value Date/Time   WBC 7.6 10/29/2015 0545   RBC 3.31* 10/29/2015 0545   HGB 9.4* 10/29/2015 0545   HCT 30.4* 10/29/2015 0545   PLT 132* 10/29/2015 0545   MCV 91.8 10/29/2015 0545   MCH 28.4 10/29/2015 0545   MCHC 30.9 10/29/2015 0545   RDW 16.7* 10/29/2015 0545   LYMPHSABS 1.2 10/29/2015 0545   MONOABS 0.8 10/29/2015 0545   EOSABS 0.5 10/29/2015 0545   BASOSABS 0.1 10/29/2015 0545    CT CHEST WITHOUT CONTRAST  TECHNIQUE: Multidetector CT imaging of the chest was performed following the standard protocol without IV contrast.  COMPARISON: 10/28/2015 and prior radiographs. 08/30/2011 CT  FINDINGS: Mediastinum/Nodes: Cardiomegaly, cardiac surgical changes and right pacemaker/ICD noted. Mildly enlarged right peritracheal lymph nodes are again identified. No new or enlarging lymph nodes are noted. There is no evidence of thoracic aortic aneurysm.  Lungs/Pleura: A loculated right pleural effusion is noted with medial right apical loculation measuring 4.5 x 7.5 cm and lateral mid right loculation measuring 5.1 x 11 cm. A small free-flowing left pleural effusion is noted. Atelectasis within both mid and lower lungs noted. Ill-defined ground-glass opacity within the left lung apex is unchanged from 2012.  Upper abdomen: Small amount of ascites is identified.  Musculoskeletal: No acute or suspicious abnormalities identified.  IMPRESSION: Loculated right pleural effusion with moderate to large loculations as described. Mid and lower lung atelectasis.  Small left pleural effusion and small amount of  ascites.  Cardiomegaly   Electronically Signed  By: Margarette Canada M.D.  On: 10/29/2015 14:02   I personally reviewed the CT chest and concur with the findings above  A/P: [name] has a loculated right para-pneumonic effusion. Normally the best approach to deal with this would be VATS for drainage and decortication. That would be a lot of surgery for her given her frailty. Another option to consider would be to treat medically with antibiotics, she has improved symptomatically and her WBC is normal. A third option would be to have radiology place a pigtail drainage catheter percutaneously and then use thrombolytics to try to get the fluid to drain.  Revonda Standard Roxan Hockey, MD [facility] [phone]

## 2015-10-29 NOTE — Progress Notes (Addendum)
PATIENT DETAILS Name: [name] Age: 74 y.o. Sex: female Date of Birth: Mar 14, 1941 Admit Date: 10/27/2015 Admitting Physician Annita Brod, MD [id number], MD  Subjective: Feels better-sitting at bedside. Right sided chest pain/SOB better  Assessment/Plan: Principal Problem: PNA with loculated pleural effusion:Continue empiric IV Abx, await reading of CT chest-await further CTVS recommendations.Leukocytosis has resolved, afebrile and clinically somewhat improved. Blood cultures 12/16 negative so far.  Active Problems: Chronic diastolic congestive heart failure:compensated, continue  Diuretics,continue Metoprolol.Last Echo 03/09/15 EF 55-60%. Daily weights.  Stage 3 [id number] close to usual baseline. Follow  Essential [id number] Metoprolol/Lisinopril-resuming Diuretics.Follow  Hypokalemia: Continue KCl supplementation, -given prior hx of VT/VF from Hypokalemia-plan to keep K>4. Follow closely  Hx of Asthma:stable-continue bronchodilators  Hx of PAF:s/p MAZE procedure 2012-reviewed last cards note on 04/07/15-not on anticoagulation due to falls  H/o VT/VF with QT prolongation:AICD in place, will attempt to Keep K> 4.0 (see above)  Hx of non obstructive CAD (Last LHC 03/12/11):stable-continue ASA/Statin/Metoprolol  Hypothyroidism:continue synthroid  Hyperlipidemia:continue crestor  Anemia:secondary to CKD/Chronic disease-Hb stable-follow  PUD s/p partial gastrectomy:continue PPI/carafate  L breast CA s/p mastectomy/chemo/XRT 8/08:continue Letrozole  Hx of Rheumatic heart disease-s/p bioprosthetic mitral valve replacement, tricuspid valve repair 2012  Hx of PAH: not able to tolerate Revatio in the past  Hx of CVA  Disposition: Remain inpatient-Home in next few days with home health services  Antimicrobial agents  See below  Anti-infectives    Start     Dose/Rate Route Frequency Ordered Stop   10/29/15 1146   vancomycin (VANCOCIN) IVPB 750 mg/150 ml premix     750 mg 150 mL/hr over 60 Minutes Intravenous Every 24 hours 10/28/15 1304     10/28/15 0600  piperacillin-tazobactam (ZOSYN) IVPB 3.375 g     3.375 g 12.5 mL/hr over 240 Minutes Intravenous 3 times per day 10/27/15 2123     10/27/15 2200  vancomycin (VANCOCIN) IVPB 750 mg/150 ml premix  Status:  Discontinued     750 mg 150 mL/hr over 60 Minutes Intravenous Every 12 hours 10/27/15 2123 10/28/15 1304   10/27/15 2100  doxycycline (VIBRAMYCIN) 100 mg in dextrose 5 % 250 mL IVPB  Status:  Discontinued     100 mg 125 mL/hr over 120 Minutes Intravenous Every 12 hours 10/27/15 1951 10/29/15 0852   10/27/15 2015  piperacillin-tazobactam (ZOSYN) IVPB 3.375 g     3.375 g 12.5 mL/hr over 240 Minutes Intravenous NOW 10/27/15 2007 10/28/15 0357   10/27/15 2015  vancomycin (VANCOCIN) IVPB 1000 mg/200 mL premix  Status:  Discontinued     1,000 mg 200 mL/hr over 60 Minutes Intravenous NOW 10/27/15 2007 10/27/15 2122      DVT Prophylaxis: Prophylactic Lovenox   Code Status: Full code   Family Communication Daughter Manuela [id number]  Procedures: None  CONSULTS:  CTVS  Time spent 30 minutes-Greater than 50% of this time was spent in counseling, explanation of diagnosis, planning of further management, and coordination of care.  MEDICATIONS: Scheduled Meds: . aspirin EC  81 mg Oral QPM  . enoxaparin (LOVENOX) injection  30 mg Subcutaneous Q24H  . ferrous sulfate  325 mg Oral Q breakfast  . folic acid  2 mg Oral Daily  . ipratropium  0.5 mg Nebulization Q6H  . letrozole  2.5 mg Oral Daily  . levalbuterol  1.25 mg Nebulization Q6H  . levothyroxine  125 mcg Oral QAC breakfast  . lisinopril  5 mg  Oral Daily  . LORazepam  2 mg Oral QHS  . magnesium oxide  400 mg Oral Daily  . Melatonin  9 mg Oral QHS  . metoprolol tartrate  25 mg Oral BID  . mirtazapine  15 mg Oral QHS  . piperacillin-tazobactam (ZOSYN)  IV  3.375 g Intravenous 3 times  per day  . potassium chloride  20 mEq Oral BID  . pramipexole  0.25 mg Oral QHS  . sodium chloride  3 mL Intravenous Q12H  . spironolactone  12.5 mg Oral Daily  . sucralfate  1 g Oral TID WC & HS  . torsemide  40 mg Oral BID  . vancomycin  750 mg Intravenous Q24H   Continuous Infusions: . sodium chloride 10 mL/hr at 10/27/15 2041   PRN Meds:.nitroGLYCERIN, oxyCODONE-acetaminophen, pantoprazole, promethazine    PHYSICAL EXAM: Vital signs in last 24 hours: Filed Vitals:   10/29/15 0935 10/29/15 0956 10/29/15 1031 10/29/15 1341  BP: 110/39   110/50  Pulse: 70  70 69  Temp:    98.7 F (37.1 C)  TempSrc:      Resp:   16 20  Height:      Weight:      SpO2: 100% 100% 96% 96%    Weight change: -1 kg (-2 lb 3.3 oz) Filed Weights   10/27/15 1816 10/28/15 2110 10/29/15 0539  Weight: 76.2 kg (167 lb 15.9 oz) 75.2 kg (165 lb 12.6 oz) 76.3 kg (168 lb 3.4 oz)   Body mass index is 29.8 kg/(m^2).   Gen Exam: Awake and alert with clear speech.   Neck: Supple, No JVD.   Chest: B/L Clear.   CVS: S1 S2 Regular Abdomen: soft, BS +, non tender, non distended.  Extremities: trace edema, lower extremities warm to touch. Neurologic: Non Focal.   Skin: No Rash.   Wounds: N/A.    Intake/Output from previous day:  Intake/Output Summary (Last 24 hours) at 10/29/15 1345 Last data filed at 10/29/15 0951  Gross per 24 hour  Intake    996 ml  Output   1220 ml  Net   -224 ml     LAB RESULTS: CBC  Recent Labs Lab 10/27/15 2100 10/28/15 0505 10/29/15 0545  WBC 13.8* 10.0 7.6  HGB 11.2* 9.2* 9.4*  HCT 36.7 29.3* 30.4*  PLT 165 126* 132*  MCV 92.7 91.8 91.8  MCH 28.3 28.8 28.4  MCHC 30.5 31.4 30.9  RDW 16.7* 16.5* 16.7*  LYMPHSABS  --  1.3 1.2  MONOABS  --  1.0 0.8  EOSABS  --  0.1 0.5  BASOSABS  --  0.0 0.1    Chemistries   Recent Labs Lab 10/27/15 2100 10/28/15 0505 10/28/15 0610 10/28/15 1424 10/29/15 0545  NA  --  136  --   --  136  K  --  3.3*  --  3.5 4.8    CL  --  90*  --   --  101  CO2  --  23  --   --  27  GLUCOSE  --  132*  --   --  98  BUN  --  21*  --   --  27*  CREATININE 1.67* 1.84*  --   --  1.95*  CALCIUM  --  8.3*  --   --  9.4  MG  --   --  3.5*  --  2.4    CBG:  Recent Labs Lab 10/28/15 0808 10/28/15 1211 10/28/15 1718 10/28/15 2106 10/29/15  0744  GLUCAP 76 109* 88 82 84    GFR Estimated Creatinine Clearance: 25.5 mL/min (by C-G formula based on Cr of 1.95).  Coagulation profile No results for input(s): INR, PROTIME in the last 168 hours.  Cardiac Enzymes No results for input(s): CKMB, TROPONINI, MYOGLOBIN in the last 168 hours.  Invalid input(s): CK  Invalid input(s): POCBNP No results for input(s): DDIMER in the last 72 hours. No results for input(s): HGBA1C in the last 72 hours. No results for input(s): CHOL, HDL, LDLCALC, TRIG, CHOLHDL, LDLDIRECT in the last 72 hours. No results for input(s): TSH, T4TOTAL, T3FREE, THYROIDAB in the last 72 hours.  Invalid input(s): FREET3 No results for input(s): VITAMINB12, FOLATE, FERRITIN, TIBC, IRON, RETICCTPCT in the last 72 hours. No results for input(s): LIPASE, AMYLASE in the last 72 hours.  Urine Studies No results for input(s): UHGB, CRYS in the last 72 hours.  Invalid input(s): UACOL, UAPR, USPG, UPH, UTP, UGL, UKET, UBIL, UNIT, UROB, ULEU, UEPI, UWBC, URBC, UBAC, CAST, UCOM, BILUA  MICROBIOLOGY: Recent Results (from the past 240 hour(s))  Culture, blood (routine x 2) Call MD if unable to obtain prior to antibiotics being given     Status: None (Preliminary result)   Collection Time: 10/27/15  8:56 PM  Result Value Ref Range Status   Specimen Description BLOOD RIGHT HAND  Final   Special Requests BOTTLES DRAWN AEROBIC AND ANAEROBIC 2.5CC  Final   Culture NO GROWTH 2 DAYS  Final   Report Status PENDING  Incomplete  Culture, blood (routine x 2) Call MD if unable to obtain prior to antibiotics being given     Status: None (Preliminary result)    Collection Time: 10/27/15  8:56 PM  Result Value Ref Range Status   Specimen Description BLOOD RIGHT HAND  Final   Special Requests BOTTLES DRAWN AEROBIC ONLY 2.5CC  Final   Culture NO GROWTH 2 DAYS  Final   Report Status PENDING  Incomplete    RADIOLOGY STUDIES/RESULTS: Dg Chest 2 View  10/28/2015  CLINICAL DATA:  Shortness of breath and cough. EXAM: CHEST - 2 VIEW COMPARISON:  04/04/2015 FINDINGS: The heart size and mediastinal contours are within normal limits. Stable appearance of pacemaker. The right lung volume is extremely low with potentially a component of right lower lung consolidation. No pulmonary edema or pleural fluid identified. No pneumothorax. Stable cardiac enlargement. The visualized skeletal structures are unremarkable. IMPRESSION: Low right lung volume with potential right lower lung consolidation/pneumonia. Electronically Signed   By: Aletta Edouard M.D.   On: 10/28/2015 12:54    Oren Binet, MD  [facility] Pager:[phone]  If 7PM-7AM, please contact night-coverage [email or web address] Password [email or web address] 10/29/2015, 1:45 PM   LOS: 2 days

## 2015-10-30 LAB — CBC WITH DIFFERENTIAL/PLATELET
BASOS ABS: 0.1 10*3/uL (ref 0.0–0.1)
Basophils Relative: 1 %
Eosinophils Absolute: 0.5 10*3/uL (ref 0.0–0.7)
Eosinophils Relative: 6 %
HEMATOCRIT: 30.8 % — AB (ref 36.0–46.0)
Hemoglobin: 9.9 g/dL — ABNORMAL LOW (ref 12.0–15.0)
LYMPHS ABS: 1.4 10*3/uL (ref 0.7–4.0)
LYMPHS PCT: 15 %
MCH: 29.1 pg (ref 26.0–34.0)
MCHC: 32.1 g/dL (ref 30.0–36.0)
MCV: 90.6 fL (ref 78.0–100.0)
Monocytes Absolute: 1.3 10*3/uL — ABNORMAL HIGH (ref 0.1–1.0)
Monocytes Relative: 13 %
NEUTROS ABS: 6.1 10*3/uL (ref 1.7–7.7)
Neutrophils Relative %: 65 %
Platelets: 143 10*3/uL — ABNORMAL LOW (ref 150–400)
RBC: 3.4 MIL/uL — AB (ref 3.87–5.11)
RDW: 16.9 % — ABNORMAL HIGH (ref 11.5–15.5)
WBC: 9.3 10*3/uL (ref 4.0–10.5)

## 2015-10-30 LAB — COMPREHENSIVE METABOLIC PANEL
ALK PHOS: 64 U/L (ref 38–126)
ALT: 9 U/L — AB (ref 14–54)
AST: 22 U/L (ref 15–41)
Albumin: 2.8 g/dL — ABNORMAL LOW (ref 3.5–5.0)
Anion gap: 12 (ref 5–15)
BILIRUBIN TOTAL: 1 mg/dL (ref 0.3–1.2)
BUN: 26 mg/dL — AB (ref 6–20)
CALCIUM: 9.2 mg/dL (ref 8.9–10.3)
CO2: 27 mmol/L (ref 22–32)
CREATININE: 1.87 mg/dL — AB (ref 0.44–1.00)
Chloride: 101 mmol/L (ref 101–111)
GFR, EST AFRICAN AMERICAN: 30 mL/min — AB (ref >60)
GFR, EST NON AFRICAN AMERICAN: 26 mL/min — AB (ref >60)
Glucose, Bld: 89 mg/dL (ref 65–99)
Potassium: 4.1 mmol/L (ref 3.5–5.1)
Sodium: 140 mmol/L (ref 135–145)
TOTAL PROTEIN: 6.4 g/dL — AB (ref 6.5–8.1)

## 2015-10-30 LAB — GLUCOSE, CAPILLARY
Glucose-Capillary: 87 mg/dL (ref 65–99)
Glucose-Capillary: 111 mg/dL — ABNORMAL HIGH (ref 65–99)
Glucose-Capillary: 102 mg/dL — ABNORMAL HIGH (ref 65–99)

## 2015-10-30 LAB — LEGIONELLA ANTIGEN, URINE

## 2015-10-30 LAB — MAGNESIUM: MAGNESIUM: 1.7 mg/dL (ref 1.7–2.4)

## 2015-10-30 MED ORDER — LORAZEPAM 1 MG PO TABS
1.0000 mg | ORAL_TABLET | Freq: Once | ORAL | Status: AC
  Administered 2015-10-30: 1 mg via ORAL
  Filled 2015-10-30: qty 1

## 2015-10-30 MED ORDER — METOPROLOL TARTRATE 12.5 MG HALF TABLET
12.5000 mg | ORAL_TABLET | Freq: Once | ORAL | Status: AC
  Administered 2015-10-30: 12.5 mg via ORAL

## 2015-10-30 MED ORDER — METOCLOPRAMIDE HCL 5 MG/ML IJ SOLN
5.0000 mg | Freq: Three times a day (TID) | INTRAMUSCULAR | Status: DC | PRN
  Administered 2015-10-31: 5 mg via INTRAVENOUS
  Filled 2015-10-30: qty 2

## 2015-10-30 NOTE — Progress Notes (Signed)
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     PT Comments    Pt with decreased SOB, but de-sat to lower 80's on 3.5 L/min with ambulation around the bed.   Follow Up Recommendations  Home health PT;Supervision/Assistance - 24 hour     Equipment Recommendations  None recommended by PT    Recommendations for Other Services       Precautions / Restrictions Precautions Precautions: Fall Precaution Comments: monitor 02 Restrictions Weight Bearing Restrictions: No    Mobility  Bed Mobility Overal bed mobility: Needs Assistance Bed Mobility: Supine to Sit     Supine to sit: Supervision;HOB elevated     General bed mobility comments: Able to come to EOB without physical assistance  Transfers Overall transfer level: Needs assistance Equipment used: Rolling walker (2 wheeled) Transfers: Sit to/from Stand Sit to Stand: Min guard         General transfer comment: cues for hand placement  Ambulation/Gait Ambulation/Gait assistance: Min guard;Min assist Ambulation Distance (Feet): 6 Feet (plus 4) Assistive device: Rolling walker (2 wheeled) Gait Pattern/deviations: Decreased step length - left;Decreased step length - right;Trunk flexed Gait velocity: decreased   General Gait Details: Ambulated around bed to [facility] with 1 small LOB. Used BSC then ambulated 4 feet to recliner. o2 on 3.5 L/min with o2 sat dropping to low 80's. Pursed lip breathing and o2 increased to upper 90's after several minutes of rest   Stairs            Wheelchair Mobility    Modified  [place]in (Stroke Patients Only)       Balance Overall balance assessment: Needs assistance Sitting-balance support: Feet supported Sitting balance-Leahy Scale: Fair     Standing balance support: Bilateral upper extremity supported Standing balance-Leahy Scale: Poor Standing balance comment: requires RW 1 small LOB with turning                    Cognition Arousal/Alertness: Awake/alert Behavior During Therapy: WFL for tasks assessed/performed Overall Cognitive Status: Within Functional Limits for tasks assessed                      Exercises      General Comments General comments (skin integrity, edema, etc.): Pt was sleepy, but not lethargic      Pertinent Vitals/Pain Pain Assessment: No/denies pain    Home Living                      Prior Function            PT Goals (current goals can now be found in the care plan section) Acute Rehab PT Goals Patient Stated Goal: to be able to breathe PT Goal Formulation: With patient Time For Goal Achievement: 11/11/15 Potential to Achieve Goals: Fair Progress towards PT goals: Progressing toward goals    Frequency  Min 3X/week    PT Plan Current plan remains appropriate    Co-evaluation             End of Session Equipment Utilized During Treatment: Oxygen Activity Tolerance: Treatment limited secondary to medical  complications (Comment);Other (comment) (de-sat) Patient left: in chair;with call bell/phone within reach;with chair alarm set     Time: [phone] PT Time Calculation (min) (ACUTE ONLY): 17 min  Charges:  $Gait Training: 8-22 mins                    G Codes:      [name] 10/30/2015, 3:05 PM

## 2015-10-30 NOTE — Care Management Note (Addendum)
Case Management Note  Patient Details  Name: [name] MRN: [id number] Date of Birth: 1941-08-16  Subjective/Objective:                  Date-10-30-15 Initial Assessment Spoke with patient at the bedside  Introduced self as case manager and explained role in discharge planning and how to be reached.  Verified patient lives in [place] with husband Verified patient anticipates to go home with spouse, at time of discharge and will have full-time supervision by husband at this time to best of their knowledge.  Patient has DME cane and rolator oxygen for night use through [facility] Expressed potential need for no other DME.  Patient denied  needing help with their medication.  Patient drives or is driven by daughter to MD appointments.  Verified patient has PCP [place] Patient states they currently receive [place] services through no one.  Patient would like [facility] to provide [facility] PT etc.  Plan: CM will continue to follow for discharge planning and [facility] resources.   Carles Collet RN BSN CM [phone]   Action/Plan:  Referral placed to [facility] for [facility] PT, info routed via [facility] Expected Discharge Date:                  Expected Discharge Plan:  [place]  In-House Referral:     Discharge planning Services  CM Consult  Post Acute Care Choice:    Choice offered to:     DME Arranged:    DME Agency:     HH Arranged:    HH Agency:     Status of Service:  In process, will continue to follow  Medicare Important Message Given:  Yes Date Medicare IM Given:    Medicare IM give by:    Date Additional Medicare IM Given:    Additional Medicare Important Message give by:     If discussed at [place] of Stay Meetings, dates discussed:    Additional Comments:  Carles Collet, RN 10/30/2015, 3:20 PM

## 2015-10-30 NOTE — Care Management Important Message (Signed)
Important Message  Patient Details  Name: [name] MRN: [id number] Date of Birth: 1941/08/30   Medicare Important Message Given:  Yes    Nathen May 10/30/2015, 12:25 PM

## 2015-10-30 NOTE — Progress Notes (Signed)
PATIENT DETAILS Name: [name] Age: 74 y.o. Sex: female Date of Birth: 1941-05-31 Admit Date: 10/27/2015 Admitting Physician Annita Brod, MD [id number], MD  Subjective: Feels better-sitting at bedside. Right sided chest pain/SOB better-but one episode of vomiting this am  Assessment/Plan: Principal Problem: PNA with loculated pleural effusion:Continue empiric IV Abx,  CT chest confirms pneumonia with loculated pleural effusion, agree with cardiothoracic surgery-would attempt to manage this conservatively with antibiotics as much as possible and only do the pig tail drainage or VATS if no response to antibiotics. Spoke with patient, she also prefers managing this with antibiotics. Spoke with infectious disease-Dr. Tommy Medal over the phone, who suggests at least 2 weeks of Augmentin on discharge, followed by repeat radiological studies to assess if any improvement.Leukocytosis has resolved, afebrile and clinically improved. Blood cultures 12/16 negative so far.  Active Problems: Chronic diastolic congestive heart failure:compensated, continue  Diuretics,continue Metoprolol.Last Echo 03/09/15 EF 55-60%. Daily weights.  Stage 3 [id number] close to usual baseline. Follow  Essential [id number] Metoprolol/Lisinopril/ Diuretics.Follow  Hypokalemia: Continue KCl supplementation, -given prior hx of VT/VF from Hypokalemia-plan to keep K>4. Follow closely  Hx of Asthma:stable-continue bronchodilators  Hx of PAF:s/p MAZE procedure 2012-reviewed last cards note on 04/07/15-not on anticoagulation due to falls  H/o VT/VF with QT prolongation:AICD in place, will attempt to Keep K> 4.0 (see above)  Hx of non obstructive CAD (Last LHC 03/12/11):stable-continue ASA/Statin/Metoprolol  Hypothyroidism:continue synthroid  Hyperlipidemia:continue crestor  Anemia:secondary to CKD/Chronic disease-Hb stable-follow  PUD s/p partial gastrectomy:continue  PPI/carafate  L breast CA s/p mastectomy/chemo/XRT 8/08:continue Letrozole  Hx of Rheumatic heart disease-s/p bioprosthetic mitral valve replacement, tricuspid valve repair 2012  Hx of PAH: not able to tolerate Revatio in the past  Hx of CVA  Disposition: Remain inpatient-Home in 1-2 days with home health services  Antimicrobial agents  See below  Anti-infectives    Start     Dose/Rate Route Frequency Ordered Stop   10/29/15 1146  vancomycin (VANCOCIN) IVPB 750 mg/150 ml premix     750 mg 150 mL/hr over 60 Minutes Intravenous Every 24 hours 10/28/15 1304     10/28/15 0600  piperacillin-tazobactam (ZOSYN) IVPB 3.375 g     3.375 g 12.5 mL/hr over 240 Minutes Intravenous 3 times per day 10/27/15 2123     10/27/15 2200  vancomycin (VANCOCIN) IVPB 750 mg/150 ml premix  Status:  Discontinued     750 mg 150 mL/hr over 60 Minutes Intravenous Every 12 hours 10/27/15 2123 10/28/15 1304   10/27/15 2100  doxycycline (VIBRAMYCIN) 100 mg in dextrose 5 % 250 mL IVPB  Status:  Discontinued     100 mg 125 mL/hr over 120 Minutes Intravenous Every 12 hours 10/27/15 1951 10/29/15 0852   10/27/15 2015  piperacillin-tazobactam (ZOSYN) IVPB 3.375 g     3.375 g 12.5 mL/hr over 240 Minutes Intravenous NOW 10/27/15 2007 10/28/15 0357   10/27/15 2015  vancomycin (VANCOCIN) IVPB 1000 mg/200 mL premix  Status:  Discontinued     1,000 mg 200 mL/hr over 60 Minutes Intravenous NOW 10/27/15 2007 10/27/15 2122      DVT Prophylaxis: Prophylactic Lovenox   Code Status: Full code   Family Communication None at bedside  Procedures: None  CONSULTS:  CTVS  Time spent 30 minutes-Greater than 50% of this time was spent in counseling, explanation of diagnosis, planning of further management, and coordination of care.  MEDICATIONS: Scheduled Meds: . aspirin EC  81 mg Oral QPM  . enoxaparin (LOVENOX) injection  30 mg Subcutaneous Q24H  . ferrous sulfate  325 mg Oral Q breakfast  . folic acid  2 mg  Oral Daily  . ipratropium  0.5 mg Nebulization TID  . letrozole  2.5 mg Oral Daily  . levalbuterol  1.25 mg Nebulization TID  . levothyroxine  125 mcg Oral QAC breakfast  . lisinopril  5 mg Oral Daily  . LORazepam  2 mg Oral QHS  . magnesium oxide  400 mg Oral Daily  . Melatonin  9 mg Oral QHS  . metoprolol tartrate  25 mg Oral BID  . mirtazapine  15 mg Oral QHS  . piperacillin-tazobactam (ZOSYN)  IV  3.375 g Intravenous 3 times per day  . potassium chloride  20 mEq Oral BID  . pramipexole  0.25 mg Oral QHS  . sodium chloride  3 mL Intravenous Q12H  . spironolactone  12.5 mg Oral Daily  . sucralfate  1 g Oral TID WC & HS  . torsemide  40 mg Oral BID  . vancomycin  750 mg Intravenous Q24H   Continuous Infusions: . sodium chloride 10 mL/hr at 10/27/15 2041   PRN Meds:.levalbuterol, nitroGLYCERIN, oxyCODONE-acetaminophen, pantoprazole, promethazine    PHYSICAL EXAM: Vital signs in last 24 hours: Filed Vitals:   10/30/15 0509 10/30/15 0903 10/30/15 1359 10/30/15 1413  BP: 122/53 141/56 140/51   Pulse: 75  70   Temp: 98.5 F (36.9 C)  98.5 F (36.9 C)   TempSrc: Oral  Oral   Resp: 20  18   Height:      Weight:      SpO2: 98%  100% 99%    Weight change: -4.03 kg (-8 lb 14.2 oz) Filed Weights   10/28/15 2110 10/29/15 0539 10/30/15 0333  Weight: 75.2 kg (165 lb 12.6 oz) 76.3 kg (168 lb 3.4 oz) 71.169 kg (156 lb 14.4 oz)   Body mass index is 27.8 kg/(m^2).   Gen Exam: Awake and alert with clear speech.   Neck: Supple, No JVD.   Chest: B/L Clear anteriorly  CVS: S1 S2 Regular Abdomen: soft, BS +, non tender, non distended.  Extremities: trace edema, lower extremities warm to touch. Neurologic: Non Focal.   Skin: No Rash.   Wounds: N/A.    Intake/Output from previous day:  Intake/Output Summary (Last 24 hours) at 10/30/15 1441 Last data filed at 10/30/15 0951  Gross per 24 hour  Intake    290 ml  Output   2870 ml  Net  -2580 ml     LAB  RESULTS: CBC  Recent Labs Lab 10/27/15 2100 10/28/15 0505 10/29/15 0545 10/30/15 0658  WBC 13.8* 10.0 7.6 9.3  HGB 11.2* 9.2* 9.4* 9.9*  HCT 36.7 29.3* 30.4* 30.8*  PLT 165 126* 132* 143*  MCV 92.7 91.8 91.8 90.6  MCH 28.3 28.8 28.4 29.1  MCHC 30.5 31.4 30.9 32.1  RDW 16.7* 16.5* 16.7* 16.9*  LYMPHSABS  --  1.3 1.2 1.4  MONOABS  --  1.0 0.8 1.3*  EOSABS  --  0.1 0.5 0.5  BASOSABS  --  0.0 0.1 0.1    Chemistries   Recent Labs Lab 10/27/15 2100 10/28/15 0505 10/28/15 0610 10/28/15 1424 10/29/15 0545 10/30/15 0658  NA  --  136  --   --  136 140  K  --  3.3*  --  3.5 4.8 4.1  CL  --  90*  --   --  101 101  CO2  --  23  --   --  27 27  GLUCOSE  --  132*  --   --  98 89  BUN  --  21*  --   --  27* 26*  CREATININE 1.67* 1.84*  --   --  1.95* 1.87*  CALCIUM  --  8.3*  --   --  9.4 9.2  MG  --   --  3.5*  --  2.4 1.7    CBG:  Recent Labs Lab 10/28/15 2106 10/29/15 0744 10/29/15 1705 10/30/15 0745 10/30/15 1245  GLUCAP 82 84 102* 87 111*    GFR Estimated Creatinine Clearance: 25.7 mL/min (by C-G formula based on Cr of 1.87).  Coagulation profile No results for input(s): INR, PROTIME in the last 168 hours.  Cardiac Enzymes No results for input(s): CKMB, TROPONINI, MYOGLOBIN in the last 168 hours.  Invalid input(s): CK  Invalid input(s): POCBNP No results for input(s): DDIMER in the last 72 hours. No results for input(s): HGBA1C in the last 72 hours. No results for input(s): CHOL, HDL, LDLCALC, TRIG, CHOLHDL, LDLDIRECT in the last 72 hours. No results for input(s): TSH, T4TOTAL, T3FREE, THYROIDAB in the last 72 hours.  Invalid input(s): FREET3 No results for input(s): VITAMINB12, FOLATE, FERRITIN, TIBC, IRON, RETICCTPCT in the last 72 hours. No results for input(s): LIPASE, AMYLASE in the last 72 hours.  Urine Studies No results for input(s): UHGB, CRYS in the last 72 hours.  Invalid input(s): UACOL, UAPR, USPG, UPH, UTP, UGL, UKET, UBIL, UNIT,  UROB, ULEU, UEPI, UWBC, URBC, UBAC, CAST, UCOM, BILUA  MICROBIOLOGY: Recent Results (from the past 240 hour(s))  Culture, blood (routine x 2) Call MD if unable to obtain prior to antibiotics being given     Status: None (Preliminary result)   Collection Time: 10/27/15  8:56 PM  Result Value Ref Range Status   Specimen Description BLOOD RIGHT HAND  Final   Special Requests BOTTLES DRAWN AEROBIC AND ANAEROBIC 2.5CC  Final   Culture NO GROWTH 3 DAYS  Final   Report Status PENDING  Incomplete  Culture, blood (routine x 2) Call MD if unable to obtain prior to antibiotics being given     Status: None (Preliminary result)   Collection Time: 10/27/15  8:56 PM  Result Value Ref Range Status   Specimen Description BLOOD RIGHT HAND  Final   Special Requests BOTTLES DRAWN AEROBIC ONLY 2.5CC  Final   Culture NO GROWTH 3 DAYS  Final   Report Status PENDING  Incomplete    RADIOLOGY STUDIES/RESULTS: Dg Chest 2 View  10/28/2015  CLINICAL DATA:  Shortness of breath and cough. EXAM: CHEST - 2 VIEW COMPARISON:  04/04/2015 FINDINGS: The heart size and mediastinal contours are within normal limits. Stable appearance of pacemaker. The right lung volume is extremely low with potentially a component of right lower lung consolidation. No pulmonary edema or pleural fluid identified. No pneumothorax. Stable cardiac enlargement. The visualized skeletal structures are unremarkable. IMPRESSION: Low right lung volume with potential right lower lung consolidation/pneumonia. Electronically Signed   By: Aletta Edouard M.D.   On: 10/28/2015 12:54   Ct Chest Wo Contrast  10/29/2015  CLINICAL DATA:  74 year old female shortness of breath. Possible loculated effusion. EXAM: CT CHEST WITHOUT CONTRAST TECHNIQUE: Multidetector CT imaging of the chest was performed following the standard protocol without IV contrast. COMPARISON:  10/28/2015 and prior radiographs.  08/30/2011 CT FINDINGS: Mediastinum/Nodes: Cardiomegaly, cardiac  surgical changes and right pacemaker/ICD noted. Mildly enlarged right  peritracheal lymph nodes are again identified. No new or enlarging lymph nodes are noted. There is no evidence of thoracic aortic aneurysm. Lungs/Pleura: A loculated right pleural effusion is noted with medial right apical loculation measuring 4.5 x 7.5 cm and lateral mid right loculation measuring 5.1 x 11 cm. A small free-flowing left pleural effusion is noted. Atelectasis within both mid and lower lungs noted. Ill-defined ground-glass opacity within the left lung apex is unchanged from 2012. Upper abdomen: Small amount of ascites is identified. Musculoskeletal: No acute or suspicious abnormalities identified. IMPRESSION: Loculated right pleural effusion with moderate to large loculations as described. Mid and lower lung atelectasis. Small left pleural effusion and small amount of ascites. Cardiomegaly Electronically Signed   By: Margarette Canada M.D.   On: 10/29/2015 14:02    Oren Binet, MD  [facility] Pager:[phone]  If 7PM-7AM, please contact night-coverage [email or web address] Password [email or web address] 10/30/2015, 2:41 PM   LOS: 3 days

## 2015-10-31 DIAGNOSIS — N179 Acute kidney failure, unspecified: Secondary | ICD-10-CM

## 2015-10-31 LAB — BASIC METABOLIC PANEL
ANION GAP: 12 (ref 5–15)
BUN: 24 mg/dL — ABNORMAL HIGH (ref 6–20)
CALCIUM: 10.4 mg/dL — AB (ref 8.9–10.3)
CHLORIDE: 96 mmol/L — AB (ref 101–111)
CO2: 30 mmol/L (ref 22–32)
Creatinine, Ser: 2.07 mg/dL — ABNORMAL HIGH (ref 0.44–1.00)
GFR calc non Af Amer: 23 mL/min — ABNORMAL LOW (ref >60)
GFR, EST AFRICAN AMERICAN: 26 mL/min — AB (ref >60)
Glucose, Bld: 105 mg/dL — ABNORMAL HIGH (ref 65–99)
POTASSIUM: 4.4 mmol/L (ref 3.5–5.1)
Sodium: 138 mmol/L (ref 135–145)

## 2015-10-31 LAB — GLUCOSE, CAPILLARY
GLUCOSE-CAPILLARY: 85 mg/dL (ref 65–99)
GLUCOSE-CAPILLARY: 114 mg/dL — AB (ref 65–99)
Glucose-Capillary: 117 mg/dL — ABNORMAL HIGH (ref 65–99)

## 2015-10-31 MED ORDER — TORSEMIDE 20 MG PO TABS: 40.0000 mg | ORAL_TABLET | Freq: Two times a day (BID) | ORAL | Status: DC

## 2015-10-31 MED ORDER — TORSEMIDE 20 MG PO TABS: 20.0000 mg | ORAL_TABLET | Freq: Two times a day (BID) | ORAL | Status: DC

## 2015-10-31 MED ORDER — AMOXICILLIN-POT CLAVULANATE 500-125 MG PO TABS
1.0000 | ORAL_TABLET | Freq: Two times a day (BID) | ORAL | Status: DC
  Administered 2015-10-31: 500 mg via ORAL
  Filled 2015-10-31 (×2): qty 1

## 2015-10-31 MED ORDER — AMOXICILLIN-POT CLAVULANATE 500-125 MG PO TABS: 1.0000 | ORAL_TABLET | Freq: Two times a day (BID) | ORAL | Status: DC

## 2015-10-31 MED ORDER — POTASSIUM CHLORIDE CRYS ER 20 MEQ PO TBCR: 20.0000 meq | EXTENDED_RELEASE_TABLET | Freq: Two times a day (BID) | ORAL | Status: DC

## 2015-10-31 MED ORDER — SPIRONOLACTONE 25 MG PO TABS: 12.5000 mg | ORAL_TABLET | Freq: Every day | ORAL | Status: DC

## 2015-10-31 MED ORDER — LISINOPRIL 5 MG PO TABS: 5.0000 mg | ORAL_TABLET | Freq: Every day | ORAL | Status: DC

## 2015-10-31 NOTE — Progress Notes (Signed)
Paged MD because of BP 102/45 HR 71 and scheduled 10:00 pm medications of 25mg  metoprolol, 15mg  mirtazapine, and 2mg  ativan. MD returned call and ordered to administer 12.5mg  metoprolol, 15mg  mirtazapine, and 1mg  ativan.  Will carry out orders at this time.

## 2015-10-31 NOTE — Progress Notes (Signed)
Patient will DC to: Patient's Home Anticipated DC date: 10/31/15 Family notified: Daughter Transport by: [facility]  CSW signing off.  Cedric Fishman, [place] Social Worker [phone]

## 2015-10-31 NOTE — Discharge Summary (Signed)
PATIENT DETAILS Name: [name] Age: 74 y.o. Sex: female Date of Birth: 05-Feb-1941 MRN: [id number]. Admitting Physician: Annita Brod, MD [id number], MD  Admit Date: 10/27/2015 Discharge date: 10/31/2015  Recommendations for Outpatient Follow-up:  1. Hold diuretics for next 2 days-resume on 12/22, needs repeat electrolyte panel 2. Continue Augmentin for 10 more days, needs a repeat CT chest to see if pneumonia/loculation better-if not please refer to cardiothoracic surgery for further evaluation. or chest x-ray 3. Please follow blood cultures till final  PRIMARY DISCHARGE DIAGNOSIS:  Principal Problem:   CAP (community acquired pneumonia) Active Problems:   SVT (supraventricular tachycardia) (HCC)   Atrial fibrillation (HCC)   Torsades de pointes (HCC)   Loculated pleural effusion   Hypothyroidism   HTN (hypertension)   DM type 2 (diabetes mellitus, type 2) (HCC)   Hyperlipidemia   Breast cancer (HCC)   Pressure ulcer      PAST MEDICAL HISTORY: Past Medical History  Diagnosis Date  . Pulmonary hypertension (HCC)     s/p RHC 4/10 - PAP 86/29, wedge 21-23  . Gastric ulcer with hemorrhage   . Rheumatic fever     Childhood  . Coronary artery disease     Nonobstructive, LVEF 55%  . Mitral regurgitation     a. s/p bioprosthetic MVR  . Tricuspid regurgitation     a. s/p TV repair  . Hypertension   . Stroke [facility])     a. 10/2010 with residual L sided weakness  . Asthma     PT. ON OXYGEN 2 L/[place]   . Diabetes mellitus   . Hypothyroidism   . Anemia, pernicious 1979  . Hepatitis     PT. STATES SHE HAD IT WHEN SHE WAS 14 YRS. OLD  . Arthritis   . GERD (gastroesophageal reflux disease)   . Fibromyalgia   . Squamous cell carcinoma (HCC)     Left arm  . Breast cancer ([place])     2008 s/p chemo and radiation (last tx 4/09) s/p left mastectomy s/p 9 lymph node resection - [name], [place]  . Atrial fibrillation ([place])     a. s/p MAZE  . 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 AICD (automatic cardioverter/defibrillator) present   . CKD (chronic kidney disease), stage III   . Cellulitis and abscess of leg 05/01/2015    LEFT LEG  . Pneumonia   . CAP (community acquired pneumonia) 10/27/2015    DISCHARGE MEDICATIONS: Current Discharge Medication List    START taking these medications   Details  amoxicillin-clavulanate (AUGMENTIN) 500-125 MG tablet Take 1 tablet (500 mg total) by mouth every 12 (twelve) hours. Qty: 20 tablet, Refills: 0      CONTINUE these medications which have CHANGED   Details  spironolactone (ALDACTONE) 25 MG tablet Take 0.5 tablets (12.5 mg total) by mouth daily. Start 12/22    torsemide (DEMADEX) 20 MG tablet Take 1 tablet (20 mg total) by mouth 2 (two) times daily. Start 12/22 Refills: 0      CONTINUE these medications which have NOT CHANGED   Details  albuterol (PROVENTIL HFA;VENTOLIN HFA) 108 (90 BASE) MCG/ACT inhaler Inhale 1 puff into the lungs every 6 (six) hours as needed for wheezing or shortness of breath.    ferrous sulfate 325 (65 FE) MG tablet Take 1 tablet (325 mg total) by mouth daily with breakfast. Qty: 30 tablet, Refills: 0    KLOR-CON M20 20 MEQ tablet Take 1 tablet by  mouth 2 (two) times daily. Refills: 3    letrozole (FEMARA) 2.5 MG tablet Take 2.5 mg by mouth daily.    levothyroxine (SYNTHROID, LEVOTHROID) 137 MCG tablet Take 1 tablet by mouth daily. Refills: 2    nitroGLYCERIN (NITROSTAT) 0.4 MG SL tablet Place 0.4 mg under the tongue every 5 (five) minutes as needed for chest pain.    oxyCODONE-acetaminophen (PERCOCET) 7.5-325 MG per tablet Take 1 tablet by mouth every 4 (four) hours as needed for moderate pain.     pantoprazole (PROTONIX) 40 MG tablet Take 40 mg by mouth daily as needed (for heartburn).    pramipexole (MIRAPEX) 0.25 MG tablet Take 0.25 mg by mouth. Take 1 tablet by mouth at 4 PM and 1 at bedtime. Refills: 3      rosuvastatin (CRESTOR) 20 MG tablet Take 1 tablet (20 mg total) by mouth daily at 6 PM. Qty: 30 tablet, Refills: 0    sucralfate (CARAFATE) 1 G tablet Take 1 g by mouth 4 (four) times daily -  with meals and at bedtime.    temazepam (RESTORIL) 30 MG capsule Take 1 capsule by mouth at bedtime. Refills: 1    aspirin EC 81 MG tablet Take 81 mg by mouth every evening.    cholecalciferol (VITAMIN D) 1000 UNITS tablet Take 1,000 Units by mouth daily.    cyanocobalamin (,VITAMIN B-12,) 1000 MCG/ML injection Inject 1,000 mcg into the muscle every 30 (thirty) days. Takes on 23rd or 24th each month    folic acid (FOLVITE) 1 MG tablet Take 1 mg by mouth 2 (two) times daily. Reported on 10/30/2015    lisinopril (PRINIVIL,ZESTRIL) 5 MG tablet Take 2.5 mg by mouth daily.    LORazepam (ATIVAN) 1 MG tablet Take 2 mg by mouth at bedtime.  Refills: 3    Melatonin 5 MG TABS Take 10 mg by mouth at bedtime.    metoprolol tartrate (LOPRESSOR) 25 MG tablet Take 25 mg by mouth 2 (two) times daily.    mirtazapine (REMERON) 15 MG tablet Take 15 mg by mouth at bedtime.       STOP taking these medications     doxycycline (VIBRAMYCIN) 100 MG capsule      magnesium oxide (MAG-OX) 400 (241.3 MG) MG tablet         ALLERGIES:   Allergies  Allergen Reactions  . Butorphanol Tartrate Other (See Comments)    REACTION: migraines, nervous/agitated  . Levofloxacin Other (See Comments)    Avoid QTc prolonging medications  . Zofran [Ondansetron Hcl] Other (See Comments)    Avoid QTc prolonging medications  . Codeine Nausea And Vomiting  . Lipitor [Atorvastatin Calcium] Diarrhea and Other (See Comments)    nightmares  . Revatio [Sildenafil Citrate] Swelling  . Keflex [Cephalexin] Other (See Comments)    Listed as allergy at [facility], but tolerated cefepime here.    BRIEF HPI:  See H&P, Labs, Consult and Test reports for all details in brief, patient is a very frail 74 year old female with multiple  medical problems-was transferred from [facility] for evaluation of pneumonia with loculated pleural effusion.  CONSULTATIONS:   CTVS  PERTINENT RADIOLOGIC STUDIES: Dg Chest 2 View  10/28/2015  CLINICAL DATA:  Shortness of breath and cough. EXAM: CHEST - 2 VIEW COMPARISON:  04/04/2015 FINDINGS: The heart size and mediastinal contours are within normal limits. Stable appearance of pacemaker. The right lung volume is extremely low with potentially a component of right lower lung consolidation. No pulmonary edema or pleural fluid identified. No  pneumothorax. Stable cardiac enlargement. The visualized skeletal structures are unremarkable. IMPRESSION: Low right lung volume with potential right lower lung consolidation/pneumonia. Electronically Signed   By: Aletta Edouard M.D.   On: 10/28/2015 12:54   Ct Chest Wo Contrast  10/29/2015  CLINICAL DATA:  74 year old female shortness of breath. Possible loculated effusion. EXAM: CT CHEST WITHOUT CONTRAST TECHNIQUE: Multidetector CT imaging of the chest was performed following the standard protocol without IV contrast. COMPARISON:  10/28/2015 and prior radiographs.  08/30/2011 CT FINDINGS: Mediastinum/Nodes: Cardiomegaly, cardiac surgical changes and right pacemaker/ICD noted. Mildly enlarged right peritracheal lymph nodes are again identified. No new or enlarging lymph nodes are noted. There is no evidence of thoracic aortic aneurysm. Lungs/Pleura: A loculated right pleural effusion is noted with medial right apical loculation measuring 4.5 x 7.5 cm and lateral mid right loculation measuring 5.1 x 11 cm. A small free-flowing left pleural effusion is noted. Atelectasis within both mid and lower lungs noted. Ill-defined ground-glass opacity within the left lung apex is unchanged from 2012. Upper abdomen: Small amount of ascites is identified. Musculoskeletal: No acute or suspicious abnormalities identified. IMPRESSION: Loculated right pleural effusion with moderate  to large loculations as described. Mid and lower lung atelectasis. Small left pleural effusion and small amount of ascites. Cardiomegaly Electronically Signed   By: Margarette Canada M.D.   On: 10/29/2015 14:02     PERTINENT LAB RESULTS: CBC:  Recent Labs  10/29/15 0545 10/30/15 0658  WBC 7.6 9.3  HGB 9.4* 9.9*  HCT 30.4* 30.8*  PLT 132* 143*   CMET CMP     Component Value Date/Time   NA 138 10/31/2015 0606   K 4.4 10/31/2015 0606   CL 96* 10/31/2015 0606   CO2 30 10/31/2015 0606   GLUCOSE 105* 10/31/2015 0606   BUN 24* 10/31/2015 0606   CREATININE 2.07* 10/31/2015 0606   CALCIUM 10.4* 10/31/2015 0606   PROT 6.4* 10/30/2015 0658   ALBUMIN 2.8* 10/30/2015 0658   AST 22 10/30/2015 0658   ALT 9* 10/30/2015 0658   ALKPHOS 64 10/30/2015 0658   BILITOT 1.0 10/30/2015 0658   GFRNONAA 23* 10/31/2015 0606   GFRAA 26* 10/31/2015 0606    GFR Estimated Creatinine Clearance: 23 mL/min (by C-G formula based on Cr of 2.07). No results for input(s): LIPASE, AMYLASE in the last 72 hours. No results for input(s): CKTOTAL, CKMB, CKMBINDEX, TROPONINI in the last 72 hours. Invalid input(s): POCBNP No results for input(s): DDIMER in the last 72 hours. No results for input(s): HGBA1C in the last 72 hours. No results for input(s): CHOL, HDL, LDLCALC, TRIG, CHOLHDL, LDLDIRECT in the last 72 hours. No results for input(s): TSH, T4TOTAL, T3FREE, THYROIDAB in the last 72 hours.  Invalid input(s): FREET3 No results for input(s): VITAMINB12, FOLATE, FERRITIN, TIBC, IRON, RETICCTPCT in the last 72 hours. Coags: No results for input(s): INR in the last 72 hours.  Invalid input(s): PT Microbiology: Recent Results (from the past 240 hour(s))  Culture, blood (routine x 2) Call MD if unable to obtain prior to antibiotics being given     Status: None (Preliminary result)   Collection Time: 10/27/15  8:56 PM  Result Value Ref Range Status   Specimen Description BLOOD RIGHT HAND  Final   Special  Requests BOTTLES DRAWN AEROBIC AND ANAEROBIC 2.5CC  Final   Culture NO GROWTH 3 DAYS  Final   Report Status PENDING  Incomplete  Culture, blood (routine x 2) Call MD if unable to obtain prior to antibiotics being given  Status: None (Preliminary result)   Collection Time: 10/27/15  8:56 PM  Result Value Ref Range Status   Specimen Description BLOOD RIGHT HAND  Final   Special Requests BOTTLES DRAWN AEROBIC ONLY 2.5CC  Final   Culture NO GROWTH 3 DAYS  Final   Report Status PENDING  Incomplete     BRIEF HOSPITAL COURSE:  PNA with loculated pleural effusion: Managed with empiric vancomycin and Zosyn while hospitalized.No further fever, leukocytosis has resolved, and clinically patient feels a whole lot better. CT chest confirmedpneumonia with loculated pleural effusion,  cardiothoracic surgery was consulted, it was felt that a VATS with decortication would not be an ideal option in this very frail patient. Other options including empiric treatment with antibiotics, pigtail drainage was discussed at length with patient and also with her daughter over the phone. Patient elected to try empiric antibiotics for a few weeks to see if this improves her overall condition.  blood cultures were drawn on admission, and these are negative at the time of discharge. Case was discussed with infectious disease, Dr. Tommy Medal over the phone, who suggests at least 2 weeks of Abx on discharge-and suggested Augmentin, followed by repeat radiological studies to assess if any improvement.Patient is in fully agreement with this plan (daughter Manuela Schwartz aware as well), and is requesting discharge today. Since improved, she will be discharged at home request. She is aware that she needs to repeat a CT chest after she completes a course of antibiotics.   Active Problems: Chronic diastolic congestive heart failure:compensated-however appears to be significantly beyond her usual dry weight of mid to high 140s (upon chart review)  on admission (167 pounds). Treated with her usual diuretic regimen, weight on discharge 153 pounds. She has chronic kidney disease stage III, creatinine at baseline is around 1.8-1.9-I spoke with Dr. Rinaldo Ratel the [place] advised to hold diuretics and resume them on 12/22. Patient has been asked to get a electrolyte panel checked in the next few days to see if further adjustment of her diuretic regimen is needed.Last Echo 03/09/15 EF 55-60%. Daily weights.  Mild Acute on Stage 3 [id number]  slightly elevated than usual baseline-holding diuretics on discharge for 2 days, resume on 12/22. See above.  Essential [id number] Metoprolol/Lisinopril/ Diuretics (see above).Follow  Hypokalemia: Continue KCl supplementation, -given prior hx of VT/VF from Hypokalemia- K was kept>4.  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  Objective:   Blood pressure 115/49, pulse 70, temperature 98.4 F (36.9 C), temperature source Oral, resp. rate 18, height 5\' 3"  (1.6 m), weight 69.582 kg (153 lb 6.4 oz), SpO2 98  %.  Intake/Output Summary (Last 24 hours) at 10/31/15 1048 Last data filed at 10/31/15 0940  Gross per 24 hour  Intake    473 ml  Output   1700 ml  Net  -1227 ml   Filed Weights   10/29/15 0539 10/30/15 0333 10/31/15 0545  Weight: 76.3 kg (168 lb 3.4 oz) 71.169 kg (156 lb 14.4 oz) 69.582 kg (153 lb 6.4 oz)    Exam Awake Alert, Oriented *3, No new F.N deficits, Normal affect [place].AT,PERRAL Supple Neck,No JVD, No cervical lymphadenopathy appriciated.  Symmetrical Chest wall movement, Good air movement bilaterally, CTAB RRR,No Gallops,Rubs or new Murmurs, No Parasternal Heave +ve B.Sounds, Abd Soft, Non tender, No organomegaly appriciated, No rebound -guarding or rigidity. No Cyanosis, Clubbing or edema, No new Rash or bruise  DISCHARGE CONDITION: Stable  DISPOSITION: Home with home health services  DISCHARGE INSTRUCTIONS:    Activity:  As tolerated with Full fall precautions use walker/cane & assistance as needed  Follow with Primary MD  ELLER,EDWARD, MD  In 2-3 days for repeat blood work (electrolytes check)  You have pneumonia with a loculated pleural effusion-we are going to try antibiotics, after you've completed course of antibiotics-please ask your primary care practitioner to repeat a CT scan of the chest. If no significant improvement, you might need thoracocentesis or pig tail drainage.  Your kidney function is slightly worse than your usual baseline-hold your diuretics(Demadex, Aldactone) for the next 2 days and start taking them on 12/22. Please make sure your primary care practitioner repeats blood work to check electrolytes in the next 2-3 days. You may require further adjustment of your diuretics dosage.  Get Medicines reviewed and adjusted: Please take all your medications with you for your next visit with your Primary MD  Please request your Primary MD to go over all hospital tests and procedure/radiological results at the follow up, please ask your Primary MD to  get all Hospital records sent to his/her office.  If you experience worsening of your admission symptoms, develop shortness of breath, life threatening emergency, suicidal or homicidal thoughts you must seek medical attention immediately by calling 911 or calling your MD immediately  if symptoms less severe.  You must read complete instructions/literature along with all the possible adverse reactions/side effects for all the Medicines you take and that have been prescribed to you. Take any new Medicines after you have completely understood and accpet all the possible adverse reactions/side effects.   Do not drive when taking Pain medications.   Do not take more than prescribed Pain, Sleep and Anxiety Medications  Special Instructions: If you have smoked or chewed Tobacco  in the last 2 yrs please stop smoking, stop any regular Alcohol  and or any Recreational drug use.  Wear Seat belts while driving.  Please note  You were cared for by a hospitalist during your hospital stay. Once you are discharged, your primary care physician will handle any further medical issues. Please note that NO REFILLS for any discharge medications will be authorized once you are discharged, as it is imperative that you return to your primary care physician (or establish a relationship with a primary care physician if you do not have one) for your aftercare needs so that they can reassess your need for medications and monitor your lab values.   Diet recommendation: Heart Healthy diet  Discharge Instructions    (HEART FAILURE PATIENTS) Call MD:  Anytime you have any of the following symptoms: 1) 3 pound weight gain in 24 hours or 5 pounds in 1 week 2) shortness of breath, with or without a dry hacking cough 3) swelling in the hands, feet or stomach 4) if  you have to sleep on extra pillows at night in order to breathe.    Complete by:  As directed      Call MD for:  persistant nausea and vomiting    Complete by:  As  directed      Call MD for:  severe uncontrolled pain    Complete by:  As directed      Call MD for:  temperature >100.4    Complete by:  As directed      Diet - low sodium heart healthy    Complete by:  As directed      Increase activity slowly    Complete by:  As directed            Follow-up Information    Follow up with ELLER,EDWARD, MD. Schedule an appointment as soon as possible for a visit in 3 days.   Specialty:  Family Medicine   Why:  Hospital follow up, Repeat electrolytes   Contact information:   [address] [place] [address] [phone]       Follow up with Melrose Nakayama, MD. Schedule an appointment as soon as possible for a visit in 1 week.   Specialty:  Cardiothoracic Surgery   Why:  Hospital follow up   Contact information:   [place] [address] [phone]       Follow up with Glori Bickers, MD. Schedule an appointment as soon as possible for a visit in 2 weeks.   Specialty:  Cardiology   Why:  Hospital follow up   Contact information:   [address] [place] [address] [phone]       Follow up with [facility].   Why:  for home health PT   Contact information:   [place] [address] [phone]      Total Time spent on discharge equals  45 minutes.  SignedOren Binet 10/31/2015 10:48 AM

## 2015-10-31 NOTE — Discharge Instructions (Signed)
Follow with Primary MD  ELLER,EDWARD, MD  In 2-3 days for repeat blood work (electrolytes check)  You have pneumonia with a loculated pleural effusion-we are going to try antibiotics, after you've completed course of antibiotics-please ask your primary care practitioner to repeat a CT scan of the chest. If no significant improvement, you might need thoracocentesis or pig tail drainage.  Your kidney function is slightly worse than your usual baseline-hold your diuretics(Demadex, Aldactone) for the next 2 days and start taking them on 12/22. Please make sure your primary care practitioner repeats blood work to check electrolytes in the next 2-3 days. You may require further adjustment of your diuretics dosage.   Get Medicines reviewed and adjusted. Please take all your medications with you for your next visit with your Primary MD  Please request your Primary MD to go over all hospital tests and procedure/radiological results at the follow up, please ask your Primary MD to get all Hospital records sent to his/her office.  If you experience worsening of your admission symptoms, develop shortness of breath, life threatening emergency, suicidal or homicidal thoughts you must seek medical attention immediately by calling 911 or calling your MD immediately  if symptoms less severe.  You must read complete instructions/literature along with all the possible adverse reactions/side effects for all the Medicines you take and that have been prescribed to you. Take any new Medicines after you have completely understood and accpet all the possible adverse reactions/side effects.   Do not drive when taking Pain medications or sleeping medications (Benzodaizepines)  Do not take more than prescribed Pain, Sleep and Anxiety Medications  Special Instructions: If you have smoked or chewed Tobacco  in the last 2 yrs please stop smoking, stop any regular Alcohol  and or any Recreational drug use.  Wear Seat belts  while driving.  Please note  You were cared for by a hospitalist during your hospital stay. Once you are discharged, your primary care physician will handle any further medical issues. Please note that NO REFILLS for any discharge medications will be authorized once you are discharged, as it is imperative that you return to your primary care physician (or establish a relationship with a primary care physician if you do not have one) for your aftercare needs so that they can reassess your need for medications and monitor your lab values.

## 2015-10-31 NOTE — Progress Notes (Signed)
Nsg Discharge Note  Admit Date:  10/27/2015 Discharge date: 10/31/2015   [name] to be D/C'd home per MD order.  AVS completed.  Copy for chart, and copy for patient signed, and dated. Patient able to verbalize understanding.  Discharge Medication:   Medication List    STOP taking these medications        doxycycline 100 MG capsule  Commonly known as:  VIBRAMYCIN      TAKE these medications        albuterol 108 (90 BASE) MCG/ACT inhaler  Commonly known as:  PROVENTIL HFA;VENTOLIN HFA  Inhale 1 puff into the lungs every 6 (six) hours as needed for wheezing or shortness of breath.     amoxicillin-clavulanate 500-125 MG tablet  Commonly known as:  AUGMENTIN  Take 1 tablet (500 mg total) by mouth every 12 (twelve) hours.     aspirin EC 81 MG tablet  Take 81 mg by mouth every evening.     cholecalciferol 1000 UNITS tablet  Commonly known as:  VITAMIN D  Take 1,000 Units by mouth daily.     cyanocobalamin 1000 MCG/ML injection  Commonly known as:  (VITAMIN B-12)  Inject 1,000 mcg into the muscle every 30 (thirty) days. Takes on 23rd or 24th each month     ferrous sulfate 325 (65 FE) MG tablet  Take 1 tablet (325 mg total) by mouth daily with breakfast.     folic acid 1 MG tablet  Commonly known as:  FOLVITE  Take 1 mg by mouth 2 (two) times daily. Reported on 10/30/2015     KLOR-CON M20 20 MEQ tablet  Generic drug:  potassium chloride SA  Take 1 tablet by mouth 2 (two) times daily.     letrozole 2.5 MG tablet  Commonly known as:  FEMARA  Take 2.5 mg by mouth daily.     levothyroxine 137 MCG tablet  Commonly known as:  SYNTHROID, LEVOTHROID  Take 1 tablet by mouth daily.     lisinopril 5 MG tablet  Commonly known as:  PRINIVIL,ZESTRIL  Take 2.5 mg by mouth daily.     LORazepam 1 MG tablet  Commonly known as:  ATIVAN  Take 2 mg by mouth at bedtime.     Melatonin 5 MG Tabs  Take 10 mg by mouth at bedtime.     metoprolol tartrate 25 MG tablet   Commonly known as:  LOPRESSOR  Take 25 mg by mouth 2 (two) times daily.     mirtazapine 15 MG tablet  Commonly known as:  REMERON  Take 15 mg by mouth at bedtime.     nitroGLYCERIN 0.4 MG SL tablet  Commonly known as:  NITROSTAT  Place 0.4 mg under the tongue every 5 (five) minutes as needed for chest pain.     oxyCODONE-acetaminophen 7.5-325 MG tablet  Commonly known as:  PERCOCET  Take 1 tablet by mouth every 4 (four) hours as needed for moderate pain.     pantoprazole 40 MG tablet  Commonly known as:  PROTONIX  Take 40 mg by mouth daily as needed (for heartburn).     pramipexole 0.25 MG tablet  Commonly known as:  MIRAPEX  Take 0.25 mg by mouth. Take 1 tablet by mouth at 4 PM and 1 at bedtime.     rosuvastatin 20 MG tablet  Commonly known as:  CRESTOR  Take 1 tablet (20 mg total) by mouth daily at 6 PM.     spironolactone 25 MG tablet  Commonly known as:  ALDACTONE  Take 0.5 tablets (12.5 mg total) by mouth daily. Start 12/22  Start taking on:  11/02/2015     sucralfate 1 G tablet  Commonly known as:  CARAFATE  Take 1 g by mouth 4 (four) times daily -  with meals and at bedtime.     temazepam 30 MG capsule  Commonly known as:  RESTORIL  Take 1 capsule by mouth at bedtime.     torsemide 20 MG tablet  Commonly known as:  DEMADEX  Take 1 tablet (20 mg total) by mouth 2 (two) times daily. Start 12/22  Start taking on:  11/02/2015        Discharge Assessment: Filed Vitals:   10/31/15 0545 10/31/15 0929  BP: 129/50 115/49  Pulse: 70   Temp: 98.4 F (36.9 C)   Resp: 18    IV catheter discontinued intact. Site without signs and symptoms of complications - no redness or edema noted at insertion site, patient denies c/o pain - only slight tenderness at site.  Dressing with slight pressure applied.  D/c Instructions-Education: Discharge instructions given to patient with verbalized understanding. D/c education completed with patient including follow up  instructions, medication list, d/c activities limitations if indicated, with other d/c instructions as indicated by MD - patient able to verbalize understanding, all questions fully answered. Patient instructed to return to ED, call 911, or call MD for any changes in condition.  Patient left unit via non-emergent ambulance stretcher.  Dorita Fray, RN 10/31/2015 1:45 PM

## 2015-11-01 LAB — CULTURE, BLOOD (ROUTINE X 2)
CULTURE: NO GROWTH
Culture: NO GROWTH

## 2015-11-16 ENCOUNTER — Encounter (HOSPITAL_COMMUNITY): Payer: Medicare Other | Admitting: Internal Medicine

## 2015-12-19 ENCOUNTER — Encounter: Payer: Self-pay | Admitting: *Deleted

## 2016-01-03 NOTE — Progress Notes (Signed)
Patient ID: [name], female   DOB: 01/19/1941, 76 y.o.   MRN: [id number]    Advanced Heart Failure Clinic Note   PCP: Dr Jerolyn Center EP: Dr Lovena Le  HPI: [name] is a 75 y.o. 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 Status post bioprosthetic mitral valve replacement, tricuspid valve repair, and Maze procedure on September 24, 2011 (pre-op cath minimal CAD). Coumadin stopped due to falls. 03/31/13 S/P DDD ICD. Has not been able to tolerate Revatio in past.  Admitted to [facility] 03/26/13 through 04/04/13  had V fib arrest in setting of hypokalemia. Then had multiple episodes or recurrent VT despite correction of electrolyte abnormalities. Seen by Dr. Caryl Comes and received ICD.   08/12/12 RHC RA = 15 v= 21  RV = 64/3/13  PA = 59/16 (33)  PCW = 14  Fick cardiac output/index = 4.1/2.4  PVR = 7.8 [place]  FA sat = 98%  PA sat = 60%, 63%   02/17/13 Echo  RV dilated but normal systolic function.  LVEF 50%. Moderate to severe TR  Septal bounce RVSP 55-60. IVC small 03/27/13 Echo  EF 55-60%. Moderate to severe TR, PASP 46  Myoview 02/2014 -Low risk stress nuclear study with small, mild, fixed anterior and inferior defects consistent with soft tissue attenuation; no ischemia. LV Ejection Fraction: 61%. LV Wall Motion: NL LV Function; NL Wall Motion   She was admitted in 4/16 after ICD discharge.  She had been put on azithromycin for bronchitis and had polymorphic VT (also hypokalemic).  She does have a history of prolonged QT interval.    Echo (4/16) with EF 55-60%, normal bioprosthetic mitral valve, no aortic stenosis, moderately dilated RV with moderately decreased systolic function, moderate TR, PA systolic pressure 63 mmHg.  Admitted 10/2015 with CAP. Weight was up to 167 lbs (previous baseline in 140s). Creatinine rose during admission and Dr. Haroldine Laws spoke with  attending over phone, held diuretics for several days on discharge. With planned recheck in 7-10 days.  Pt no showed post hospital HF follow up. Discharge weight 153 kvs  She returns for followup.  Has not been feeling well over past few months.  Weight at home usually 143-146, Now at 175 lbs. She is SOB with ADLs including getting dressed and bathing.  She can walk to bathroom and back but is SOB. Chronic orthopnea, uses 3-4 pillows. No lightheadedness or dizziness. No CP. Occasional PND. NO ICD shocks.   Labs (7/14): Creatinine 1.15, K+4.0 Labs (09/28/13): K 4.5 Creatinine 1.02  Labs (11/16/12): K 5.2 Creatine 1.22  Labs (3/15): K 4.2, creatinine 1.18, LDL 103, HDL 34 Labs (5/16): K 3.8, creatinine 1.34, HCT 36.8, Mg 1.8 Labs (12/16): K 4.4, creatinine 2.07  ROS: All systems negative except as listed in HPI, PMH and Problem List.  Past Medical History  Diagnosis Date  . Pulmonary hypertension (HCC)     s/p RHC 4/10 - PAP 86/29, wedge 21-23  . Gastric ulcer with hemorrhage   . Rheumatic fever     Childhood  . Coronary artery disease     Nonobstructive, LVEF 55%  . Mitral regurgitation     a. s/p bioprosthetic MVR  . Tricuspid regurgitation     a. s/p TV repair  . Hypertension   . Stroke [facility])     a. 10/2010 with residual L sided weakness  . Asthma  PT. ON OXYGEN 2 L/[place]   . Diabetes mellitus   . Hypothyroidism   . Anemia, pernicious 1979  . Hepatitis     PT. STATES SHE HAD IT WHEN SHE WAS 14 YRS. OLD  . Arthritis   . GERD (gastroesophageal reflux disease)   . Fibromyalgia   . Squamous cell carcinoma (HCC)     Left arm  . Breast cancer ([place])     2008 s/p chemo and radiation (last tx 4/09) s/p left mastectomy s/p 9 lymph node resection - [name], [place]  . Atrial fibrillation ([place])     a. s/p MAZE  . 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 AICD (automatic cardioverter/defibrillator) present   . CKD (chronic kidney disease),  stage III   . Cellulitis and abscess of leg 05/01/2015    LEFT LEG  . Pneumonia   . CAP (community acquired pneumonia) 10/27/2015    Current Outpatient Prescriptions  Medication Sig Dispense Refill  . albuterol (PROVENTIL HFA;VENTOLIN HFA) 108 (90 BASE) MCG/ACT inhaler Inhale 1 puff into the lungs every 6 (six) hours as needed for wheezing or shortness of breath.    Marland Kitchen aspirin EC 81 MG tablet Take 81 mg by mouth every evening.    . cholecalciferol (VITAMIN D) 1000 UNITS tablet Take 1,000 Units by mouth daily.    . cyanocobalamin (,VITAMIN B-12,) 1000 MCG/ML injection Inject 1,000 mcg into the muscle every 30 (thirty) days. Takes on 23rd or 24th each month    . ferrous sulfate 325 (65 FE) MG tablet Take 1 tablet (325 mg total) by mouth daily with breakfast. 30 tablet 0  . folic acid (FOLVITE) 1 MG tablet Take 1 mg by mouth 2 (two) times daily. Reported on 10/30/2015    . levothyroxine (SYNTHROID, LEVOTHROID) 137 MCG tablet Take 1 tablet by mouth daily.  2  . lisinopril (PRINIVIL,ZESTRIL) 5 MG tablet Take 2.5 mg by mouth daily.    Marland Kitchen LORazepam (ATIVAN) 1 MG tablet Take 2 mg by mouth at bedtime.   3  . Melatonin 5 MG TABS Take 10 mg by mouth at bedtime.    . metoprolol tartrate (LOPRESSOR) 25 MG tablet Take 25 mg by mouth 2 (two) times daily.    . mirtazapine (REMERON) 15 MG tablet Take 15 mg by mouth at bedtime.     Marland Kitchen oxyCODONE-acetaminophen (PERCOCET) 7.5-325 MG per tablet Take 1 tablet by mouth every 4 (four) hours as needed for moderate pain.     . pantoprazole (PROTONIX) 40 MG tablet Take 40 mg by mouth daily as needed (for heartburn).    . potassium chloride SA (K-DUR,KLOR-CON) 20 MEQ tablet Take 20 mEq by mouth 3 (three) times daily.    . pramipexole (MIRAPEX) 0.25 MG tablet Take 0.25 mg by mouth. Take 1 tablet by mouth at 4 PM and 1 at bedtime.  3  . rosuvastatin (CRESTOR) 20 MG tablet Take 1 tablet (20 mg total) by mouth daily at 6 PM. 30 tablet 0  . sucralfate (CARAFATE) 1 G tablet Take  1 g by mouth 4 (four) times daily -  with meals and at bedtime.    . temazepam (RESTORIL) 30 MG capsule Take 1 capsule by mouth at bedtime.  1  . torsemide (DEMADEX) 20 MG tablet Take 20 mg by mouth 3 (three) times daily.    . nitroGLYCERIN (NITROSTAT) 0.4 MG SL tablet Place 0.4 mg under the tongue every 5 (five) minutes as needed for chest pain. Reported  on 01/04/2016     No current facility-administered medications for this encounter.    Filed Vitals:   01/04/16 1113  Weight: 175 lb 3.2 oz (79.47 kg)   PHYSICAL EXAM: General:  Chronically ill appearing. No acute distress.No resp difficulty; daughter present. In wheelchair HEENT: normal Neck: supple. JVP 14+ cm with +CV waves. Carotids 2+ bilaterally; no bruits. No thryomegaly appreciated. Cor: PMI normal. RRR. No rubs, gallops.  3/6 HSM LLSB.   Lungs:  Basilar crackles.  Abdomen: soft, obese,  NT, ND, no HSM. No bruits or masses. +BS  Extremities: 2-3+ edema into thighs Neuro: alert & orientedx3, cranial nerves grossly intact. Moves all 4 extremities w/o difficulty. Affect pleasant.   ASSESSMENT & PLAN:  1) Chronic diastolic HF: EF 0000000 with moderate RV dysfunction and moderate to severe TR (moderate by 4/16 echo).  Suspect that there is a prominent component of RV failure in the face of significant TR.   - She is markedly volume overloaded with JVP to ear and NYHA Class IIIb symptoms on exam. She is up 20-30 lbs from previous baseline.    - Admit for IV diuresis.  Would likely benefit from [place] once fluid corrected.  - Will use lasix gtt at 15 mg/hr with 2.5 mg metolazone daily. SUPP K AGGRESSIVELY. Pt has hx of going into VT with low K.  - Consider PICC line for CVP with aggressive diuresis.  - Repeat Echo. - Will have full device interrogation once admitted.  2) Chest Pain:  - No CP currently. Last cath 2012 with minimal CAD. 3) Atrial Fibrillation:  - Paroxysmal, s/p Maze.  - EKG today shows A-paced rhythm with prolonged AV  conduction.  - She has not been anticoagulated due to fall risk.   4) Tricuspid regurgitation: -  Moderate on last echo in 4/16.  - Will repeat echo.  5) Mitral valve disease: Patient had bioprosthetic MV replacement.  MV looked ok on last echo.  - Echo as above.  6) VT: Recent polymorphic VT in setting of prolonged QT interval.   - Continue Coreg.  She needs to be careful with new medications and avoid meds that prolong the QT interval.   - Will follow K and Mg with diuresis. Will supp K aggressively as needed.    Satira Mccallum Tillery PA-C 01/04/2016

## 2016-01-04 ENCOUNTER — Inpatient Hospital Stay (HOSPITAL_COMMUNITY)
Admission: AD | Admit: 2016-01-04 | Discharge: 2016-01-09 | DRG: 291 | Disposition: A | Discharge status: Home / Self Care | Payer: Medicare Other | Source: Ambulatory Visit | Attending: Internal Medicine | Admitting: Internal Medicine

## 2016-01-04 ENCOUNTER — Encounter: Payer: Self-pay | Admitting: Licensed Clinical Social Worker

## 2016-01-04 ENCOUNTER — Ambulatory Visit (HOSPITAL_BASED_OUTPATIENT_CLINIC_OR_DEPARTMENT_OTHER)
Admission: RE | Admit: 2016-01-04 | Discharge: 2016-01-04 | Disposition: A | Discharge status: Home / Self Care | Payer: Medicare Other | Source: Ambulatory Visit | Attending: Internal Medicine | Admitting: Internal Medicine

## 2016-01-04 ENCOUNTER — Encounter (HOSPITAL_COMMUNITY): Payer: Self-pay | Admitting: Emergency Medicine

## 2016-01-04 VITALS — BP 114/74 | HR 71 | Wt 175.2 lb

## 2016-01-04 DIAGNOSIS — Z9221 Personal history of antineoplastic chemotherapy: Secondary | ICD-10-CM

## 2016-01-04 DIAGNOSIS — I130 Hypertensive heart and chronic kidney disease with heart failure and stage 1 through stage 4 chronic kidney disease, or unspecified chronic kidney disease: Secondary | ICD-10-CM | POA: Diagnosis present

## 2016-01-04 DIAGNOSIS — I4901 Ventricular fibrillation: Secondary | ICD-10-CM | POA: Diagnosis present

## 2016-01-04 DIAGNOSIS — Z79899 Other long term (current) drug therapy: Secondary | ICD-10-CM

## 2016-01-04 DIAGNOSIS — Z953 Presence of xenogenic heart valve: Secondary | ICD-10-CM

## 2016-01-04 DIAGNOSIS — I081 Rheumatic disorders of both mitral and tricuspid valves: Secondary | ICD-10-CM | POA: Diagnosis present

## 2016-01-04 DIAGNOSIS — E1122 Type 2 diabetes mellitus with diabetic chronic kidney disease: Secondary | ICD-10-CM | POA: Diagnosis present

## 2016-01-04 DIAGNOSIS — J45909 Unspecified asthma, uncomplicated: Secondary | ICD-10-CM | POA: Diagnosis present

## 2016-01-04 DIAGNOSIS — Z9012 Acquired absence of left breast and nipple: Secondary | ICD-10-CM | POA: Diagnosis not present

## 2016-01-04 DIAGNOSIS — K219 Gastro-esophageal reflux disease without esophagitis: Secondary | ICD-10-CM | POA: Diagnosis present

## 2016-01-04 DIAGNOSIS — M797 Fibromyalgia: Secondary | ICD-10-CM | POA: Diagnosis present

## 2016-01-04 DIAGNOSIS — I69351 Hemiplegia and hemiparesis following cerebral infarction affecting right dominant side: Secondary | ICD-10-CM

## 2016-01-04 DIAGNOSIS — R7989 Other specified abnormal findings of blood chemistry: Secondary | ICD-10-CM | POA: Diagnosis present

## 2016-01-04 DIAGNOSIS — I509 Heart failure, unspecified: Secondary | ICD-10-CM | POA: Diagnosis not present

## 2016-01-04 DIAGNOSIS — I480 Paroxysmal atrial fibrillation: Secondary | ICD-10-CM | POA: Diagnosis present

## 2016-01-04 DIAGNOSIS — I272 Other secondary pulmonary hypertension: Secondary | ICD-10-CM | POA: Diagnosis not present

## 2016-01-04 DIAGNOSIS — E875 Hyperkalemia: Secondary | ICD-10-CM | POA: Diagnosis present

## 2016-01-04 DIAGNOSIS — Z923 Personal history of irradiation: Secondary | ICD-10-CM

## 2016-01-04 DIAGNOSIS — L89312 Pressure ulcer of right buttock, stage 2: Secondary | ICD-10-CM | POA: Diagnosis present

## 2016-01-04 DIAGNOSIS — Z7982 Long term (current) use of aspirin: Secondary | ICD-10-CM | POA: Diagnosis not present

## 2016-01-04 DIAGNOSIS — I2510 Atherosclerotic heart disease of native coronary artery without angina pectoris: Secondary | ICD-10-CM | POA: Diagnosis present

## 2016-01-04 DIAGNOSIS — Z8711 Personal history of peptic ulcer disease: Secondary | ICD-10-CM

## 2016-01-04 DIAGNOSIS — Z9119 Patient's noncompliance with other medical treatment and regimen: Secondary | ICD-10-CM | POA: Diagnosis not present

## 2016-01-04 DIAGNOSIS — N183 Chronic kidney disease, stage 3 (moderate): Secondary | ICD-10-CM | POA: Diagnosis present

## 2016-01-04 DIAGNOSIS — I5033 Acute on chronic diastolic (congestive) heart failure: Secondary | ICD-10-CM | POA: Insufficient documentation

## 2016-01-04 DIAGNOSIS — E876 Hypokalemia: Secondary | ICD-10-CM | POA: Diagnosis present

## 2016-01-04 DIAGNOSIS — Z903 Acquired absence of stomach [part of]: Secondary | ICD-10-CM | POA: Diagnosis not present

## 2016-01-04 DIAGNOSIS — I482 Chronic atrial fibrillation: Secondary | ICD-10-CM | POA: Diagnosis present

## 2016-01-04 DIAGNOSIS — Z853 Personal history of malignant neoplasm of breast: Secondary | ICD-10-CM | POA: Diagnosis present

## 2016-01-04 LAB — COMPREHENSIVE METABOLIC PANEL
ALBUMIN: 3.1 g/dL — AB (ref 3.5–5.0)
ALT: 10 U/L — ABNORMAL LOW (ref 14–54)
AST: 23 U/L (ref 15–41)
Alkaline Phosphatase: 85 U/L (ref 38–126)
Anion gap: 11 (ref 5–15)
BUN: 20 mg/dL (ref 6–20)
CHLORIDE: 101 mmol/L (ref 101–111)
CO2: 27 mmol/L (ref 22–32)
Calcium: 9.1 mg/dL (ref 8.9–10.3)
Creatinine, Ser: 1.31 mg/dL — ABNORMAL HIGH (ref 0.44–1.00)
GFR calc Af Amer: 46 mL/min — ABNORMAL LOW (ref >60)
GFR calc non Af Amer: 40 mL/min — ABNORMAL LOW (ref >60)
GLUCOSE: 89 mg/dL (ref 65–99)
POTASSIUM: 4.3 mmol/L (ref 3.5–5.1)
SODIUM: 139 mmol/L (ref 135–145)
Total Bilirubin: 1.1 mg/dL (ref 0.3–1.2)
Total Protein: 7.3 g/dL (ref 6.5–8.1)

## 2016-01-04 LAB — CBC
HEMATOCRIT: 32.3 % — AB (ref 36.0–46.0)
HEMOGLOBIN: 9.9 g/dL — AB (ref 12.0–15.0)
MCH: 26.5 pg (ref 26.0–34.0)
MCHC: 30.7 g/dL (ref 30.0–36.0)
MCV: 86.6 fL (ref 78.0–100.0)
Platelets: 194 10*3/uL (ref 150–400)
RBC: 3.73 MIL/uL — ABNORMAL LOW (ref 3.87–5.11)
RDW: 17 % — AB (ref 11.5–15.5)
WBC: 7.2 10*3/uL (ref 4.0–10.5)

## 2016-01-04 LAB — MAGNESIUM: Magnesium: 1.6 mg/dL — ABNORMAL LOW (ref 1.7–2.4)

## 2016-01-04 LAB — MRSA PCR SCREENING: MRSA by PCR: NEGATIVE

## 2016-01-04 LAB — BRAIN NATRIURETIC PEPTIDE: B Natriuretic Peptide: 227.3 pg/mL — ABNORMAL HIGH (ref 0.0–100.0)

## 2016-01-04 MED ORDER — MIRTAZAPINE 15 MG PO TABS
15.0000 mg | ORAL_TABLET | Freq: Every day | ORAL | Status: DC
  Administered 2016-01-04 – 2016-01-08 (×5): 15 mg via ORAL
  Filled 2016-01-04 (×7): qty 1

## 2016-01-04 MED ORDER — OXYCODONE-ACETAMINOPHEN 7.5-325 MG PO TABS
1.0000 | ORAL_TABLET | Freq: Four times a day (QID) | ORAL | Status: DC | PRN
Start: 2016-01-04 — End: 2016-01-09
  Administered 2016-01-05 – 2016-01-09 (×8): 1 via ORAL
  Filled 2016-01-04 (×10): qty 1

## 2016-01-04 MED ORDER — ROSUVASTATIN CALCIUM 20 MG PO TABS
20.0000 mg | ORAL_TABLET | Freq: Every day | ORAL | Status: DC
  Administered 2016-01-04 – 2016-01-09 (×6): 20 mg via ORAL
  Filled 2016-01-04 (×6): qty 1

## 2016-01-04 MED ORDER — LORAZEPAM 1 MG PO TABS
2.0000 mg | ORAL_TABLET | Freq: Every day | ORAL | Status: DC
  Administered 2016-01-04 – 2016-01-08 (×5): 2 mg via ORAL
  Filled 2016-01-04 (×5): qty 2

## 2016-01-04 MED ORDER — CETYLPYRIDINIUM CHLORIDE 0.05 % MT LIQD
7.0000 mL | Freq: Two times a day (BID) | OROMUCOSAL | Status: DC
  Administered 2016-01-04 – 2016-01-09 (×8): 7 mL via OROMUCOSAL

## 2016-01-04 MED ORDER — MELATONIN 3 MG PO TABS
10.0000 mg | ORAL_TABLET | Freq: Every day | ORAL | Status: DC
  Administered 2016-01-04 – 2016-01-05 (×2): 10.5 mg via ORAL
  Filled 2016-01-04 (×3): qty 3.5

## 2016-01-04 MED ORDER — ACETAMINOPHEN 325 MG PO TABS: 650.0000 mg | ORAL_TABLET | ORAL | Status: DC | PRN

## 2016-01-04 MED ORDER — FOLIC ACID 1 MG PO TABS
1.0000 mg | ORAL_TABLET | Freq: Two times a day (BID) | ORAL | Status: DC
  Administered 2016-01-05 – 2016-01-09 (×9): 1 mg via ORAL
  Filled 2016-01-04 (×9): qty 1

## 2016-01-04 MED ORDER — SODIUM CHLORIDE 0.9% FLUSH
3.0000 mL | Freq: Two times a day (BID) | INTRAVENOUS | Status: DC
  Administered 2016-01-07 – 2016-01-09 (×4): 3 mL via INTRAVENOUS

## 2016-01-04 MED ORDER — MAGNESIUM SULFATE 4 GM/100ML IV SOLN
4.0000 g | Freq: Once | INTRAVENOUS | Status: AC
  Administered 2016-01-04: 4 g via INTRAVENOUS
  Filled 2016-01-04: qty 100

## 2016-01-04 MED ORDER — PANTOPRAZOLE SODIUM 40 MG PO TBEC
40.0000 mg | DELAYED_RELEASE_TABLET | Freq: Every day | ORAL | Status: DC | PRN
  Filled 2016-01-04: qty 1

## 2016-01-04 MED ORDER — ASPIRIN EC 81 MG PO TBEC
81.0000 mg | DELAYED_RELEASE_TABLET | Freq: Every evening | ORAL | Status: DC
  Administered 2016-01-04 – 2016-01-09 (×6): 81 mg via ORAL
  Filled 2016-01-04 (×6): qty 1

## 2016-01-04 MED ORDER — SODIUM CHLORIDE 0.9% FLUSH: 3.0000 mL | INTRAVENOUS | Status: DC | PRN

## 2016-01-04 MED ORDER — PRAMIPEXOLE DIHYDROCHLORIDE 0.25 MG PO TABS
0.2500 mg | ORAL_TABLET | ORAL | Status: DC
  Administered 2016-01-04 – 2016-01-09 (×11): 0.25 mg via ORAL
  Filled 2016-01-04 (×13): qty 1

## 2016-01-04 MED ORDER — NITROGLYCERIN 0.4 MG SL SUBL: 0.4000 mg | SUBLINGUAL_TABLET | SUBLINGUAL | Status: DC | PRN

## 2016-01-04 MED ORDER — VITAMIN D 1000 UNITS PO TABS
1000.0000 [IU] | ORAL_TABLET | Freq: Every day | ORAL | Status: DC
  Administered 2016-01-05 – 2016-01-09 (×5): 1000 [IU] via ORAL
  Filled 2016-01-04 (×5): qty 1

## 2016-01-04 MED ORDER — DEXTROSE 5 % IV SOLN
15.0000 mg/h | INTRAVENOUS | Status: DC
  Administered 2016-01-04 – 2016-01-07 (×4): 15 mg/h via INTRAVENOUS
  Filled 2016-01-04 (×10): qty 25

## 2016-01-04 MED ORDER — SODIUM CHLORIDE 0.9 % IV SOLN: 250.0000 mL | INTRAVENOUS | Status: DC | PRN

## 2016-01-04 MED ORDER — ALBUTEROL SULFATE (2.5 MG/3ML) 0.083% IN NEBU: 2.5000 mg | INHALATION_SOLUTION | Freq: Four times a day (QID) | RESPIRATORY_TRACT | Status: DC | PRN

## 2016-01-04 MED ORDER — POTASSIUM CHLORIDE CRYS ER 20 MEQ PO TBCR
40.0000 meq | EXTENDED_RELEASE_TABLET | Freq: Two times a day (BID) | ORAL | Status: DC
  Administered 2016-01-05 – 2016-01-07 (×6): 40 meq via ORAL
  Filled 2016-01-04 (×7): qty 2

## 2016-01-04 MED ORDER — SUCRALFATE 1 G PO TABS
1.0000 g | ORAL_TABLET | Freq: Three times a day (TID) | ORAL | Status: DC
  Administered 2016-01-04 – 2016-01-09 (×20): 1 g via ORAL
  Filled 2016-01-04 (×20): qty 1

## 2016-01-04 MED ORDER — FERROUS SULFATE 325 (65 FE) MG PO TABS
325.0000 mg | ORAL_TABLET | Freq: Every day | ORAL | Status: DC
  Administered 2016-01-05 – 2016-01-09 (×5): 325 mg via ORAL
  Filled 2016-01-04 (×5): qty 1

## 2016-01-04 MED ORDER — ENOXAPARIN SODIUM 40 MG/0.4ML ~~LOC~~ SOLN
40.0000 mg | SUBCUTANEOUS | Status: DC
  Administered 2016-01-04 – 2016-01-08 (×5): 40 mg via SUBCUTANEOUS
  Filled 2016-01-04 (×5): qty 0.4

## 2016-01-04 MED ORDER — METOLAZONE 2.5 MG PO TABS
2.5000 mg | ORAL_TABLET | Freq: Every day | ORAL | Status: DC
  Administered 2016-01-04 – 2016-01-07 (×4): 2.5 mg via ORAL
  Filled 2016-01-04 (×4): qty 1

## 2016-01-04 MED ORDER — LISINOPRIL 2.5 MG PO TABS
2.5000 mg | ORAL_TABLET | Freq: Every day | ORAL | Status: DC
  Administered 2016-01-05 – 2016-01-09 (×5): 2.5 mg via ORAL
  Filled 2016-01-04 (×5): qty 1

## 2016-01-04 MED ORDER — LEVOTHYROXINE SODIUM 25 MCG PO TABS
137.0000 ug | ORAL_TABLET | Freq: Every day | ORAL | Status: DC
  Administered 2016-01-05 – 2016-01-09 (×5): 137 ug via ORAL
  Filled 2016-01-04 (×5): qty 1

## 2016-01-04 MED ORDER — METOPROLOL TARTRATE 25 MG PO TABS
25.0000 mg | ORAL_TABLET | Freq: Two times a day (BID) | ORAL | Status: DC
  Administered 2016-01-04 – 2016-01-09 (×10): 25 mg via ORAL
  Filled 2016-01-04 (×10): qty 1

## 2016-01-04 NOTE — Progress Notes (Signed)
Utilization review completed. Torre Pikus, RN, BSN. 

## 2016-01-04 NOTE — Progress Notes (Signed)
CSW referred to assist patient and daughter with prescription coverage. Daughter reports that patient's prescription coverage thru Medicare D was terminated due to lack of payment. Daughter reports patient and her husband were unaware that the payments had not been made until coverage was terminated. Daughter reports they were told they would have to wait until open enrollment in October, 2017 to re enroll. Daughter states she has used Paramedic and without incident for patient's medications since termination. CSW provided the contact info for [facility] for Daughter to follow up regarding Medicare D. Daughter verbalizes understanding of follow up and will return call to CSW if further needs arise. Raquel Sarna, [place]

## 2016-01-04 NOTE — Progress Notes (Signed)
Advanced Heart Failure Medication Review by a Pharmacist  Does the patient  feel that his/her medications are working for him/her?  yes  Has the patient been experiencing any side effects to the medications prescribed?  no  Does the patient measure his/her own blood pressure or blood glucose at home?  yes   Does the patient have any problems obtaining medications due to transportation or finances?   Yes - recently lost Rx insurance  Understanding of regimen: good Understanding of indications: good Potential of compliance: good Patient understands to avoid NSAIDs. Patient understands to avoid decongestants.  Issues to address at subsequent visits: Rx insurance status   Pharmacist comments:  Ms. Shingledecker is a pleasant 75 yo F presenting with her daughter but without a medication list although she is able to verbalize most of her medications to me including dosages. She reports excellent compliance with her regimen but states that she has not been feeling well lately and her weight has been steadily climbing. Her daughter did state that she recently lost her Rx insurance 2/2 not realizing that she had a premium to pay. Will see if I can get Kennyth Lose, our Education officer, museum, to help with this. She did not have any specific medication-related questions or concerns for me at this time.    Ruta Hinds. Velva Harman, PharmD, BCPS, CPP Clinical Pharmacist Pager: [phone] Phone: [phone] 01/04/2016 11:34 AM   Time with patient: 14 minutes Preparation and documentation time: 2 minutes Total time: 16 minutes

## 2016-01-04 NOTE — H&P (Addendum)
atient ID: Maryjane Hurter, female DOB: [birth year], 75 y.o. MRN: [id number]    Advanced Heart Failure Clinic Note   PCP: Dr Jerolyn Center EP: Dr Lovena Le  HPI: Ms. Kunzler is a 75 y.o. 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 Status post bioprosthetic mitral valve replacement, tricuspid valve repair, and Maze procedure on September 24, 2011 (pre-op cath minimal CAD). Coumadin stopped due to falls. 03/31/13 S/P DDD ICD. Has not been able to tolerate Revatio in past.  Admitted to [facility] 03/26/13 through 04/04/13 had V fib arrest in setting of hypokalemia. Then had multiple episodes or recurrent VT despite correction of electrolyte abnormalities. Seen by Dr. Caryl Comes and received ICD.   08/12/12 RHC RA = 15 v= 21  RV = 64/3/13  PA = 59/16 (33)  PCW = 14  Fick cardiac output/index = 4.1/2.4  PVR = 7.8 [place]  FA sat = 98%  PA sat = 60%, 63%   02/17/13 Echo RV dilated but normal systolic function. LVEF 50%. Moderate to severe TR Septal bounce RVSP 55-60. IVC small 03/27/13 Echo EF 55-60%. Moderate to severe TR, PASP 46  Myoview 02/2014 -Low risk stress nuclear study with small, mild, fixed anterior and inferior defects consistent with soft tissue attenuation; no ischemia. LV Ejection Fraction: 61%. LV Wall Motion: NL LV Function; NL Wall Motion   She was admitted in 4/16 after ICD discharge. She had been put on azithromycin for bronchitis and had polymorphic VT (also hypokalemic). She does have a history of prolonged QT interval.   Echo (4/16) with EF 55-60%, normal bioprosthetic mitral valve, no aortic stenosis, moderately dilated RV with moderately decreased systolic function, moderate TR, PA systolic pressure 63 mmHg.  Admitted 10/2015 with CAP. Weight was up to 167 lbs (previous baseline in 140s). Creatinine rose during admission and Dr. Haroldine Laws spoke  with attending over phone, held diuretics for several days on discharge. With planned recheck in 7-10 days. Pt no showed post hospital HF follow up. Discharge weight 153 kvs  She returns for followup. Has not been feeling well over past few months. Weight at home usually 143-146, Now at 175 lbs. She is SOB with ADLs including getting dressed and bathing. She can walk to bathroom and back but is SOB. Chronic orthopnea, uses 3-4 pillows. No lightheadedness or dizziness. No CP. + PND. NO ICD shocks.   Review of Systems: [y] = yes, [ ]  = no   General: Weight gain [ ] ; Weight loss [ ] ; Anorexia [ ] ; Fatigue [Y ]; Fever [ ] ; Chills [ ] ; Weakness [Y ]  Cardiac: Chest pain/pressure [ ] ; Resting SOB [ ] ; Exertional SOB [Y]; Orthopnea [y]; Pedal Edema [y]; Palpitations [ ] ; Syncope [ ] ; Presyncope [ ] ; Paroxysmal nocturnal dyspnea[y]  Pulmonary: Cough [y]; Wheezing[ ] ; Hemoptysis[ ] ; Sputum [ ] ; Snoring [ ]   GI: Vomiting[ ] ; Dysphagia[ ] ; Melena[ ] ; Hematochezia [ ] ; Heartburn[ ] ; Abdominal pain [ ] ; Constipation [ ] ; Diarrhea [ ] ; BRBPR [ ]   GU: Hematuria[ ] ; Dysuria [ ] ; Nocturia[ ]   Vascular: Pain in legs with walking [ ] ; Pain in feet with lying flat [ ] ; Non-healing sores [ ] ; Stroke [ ] ; TIA [ ] ; Slurred speech [ ] ;  Neuro: Headaches[ ] ; Vertigo[ ] ; Seizures[ ] ; Paresthesias[ ] ;Blurred vision [ ] ; Diplopia [ ] ; Vision changes [ ]   Ortho/Skin: Arthritis [y]; Joint pain [y]; Muscle pain [ ] ;  Joint swelling [ ] ; Back Pain [ ] ; Rash [y]  Psych: Depression[ ] ; Anxiety[ ]   Heme: Bleeding problems [ ] ; Clotting disorders [ ] ; Anemia [ ]   Endocrine: Diabetes [y]; Thyroid dysfunction[ ]    Labs (7/14): Creatinine 1.15, K+4.0 Labs (09/28/13): K 4.5 Creatinine 1.02  Labs (11/16/12): K 5.2 Creatine 1.22  Labs (3/15): K 4.2, creatinine 1.18, LDL 103, HDL 34 Labs (5/16): K 3.8, creatinine 1.34, HCT 36.8, Mg 1.8 Labs (12/16): K 4.4, creatinine 2.07   Past Medical History  Diagnosis Date  .  Pulmonary hypertension (HCC)     s/p RHC 4/10 - PAP 86/29, wedge 21-23  . Gastric ulcer with hemorrhage   . Rheumatic fever     Childhood  . Coronary artery disease     Nonobstructive, LVEF 55%  . Mitral regurgitation     a. s/p bioprosthetic MVR  . Tricuspid regurgitation     a. s/p TV repair  . Hypertension   . Stroke [facility])     a. 10/2010 with residual L sided weakness  . Asthma     PT. ON OXYGEN 2 L/[place]   . Diabetes mellitus   . Hypothyroidism   . Anemia, pernicious 1979  . Hepatitis     PT. STATES SHE HAD IT WHEN SHE WAS 14 YRS. OLD  . Arthritis   . GERD (gastroesophageal reflux disease)   . Fibromyalgia   . Squamous cell carcinoma (HCC)     Left arm  . Breast cancer ([place])     2008 s/p chemo and radiation (last tx 4/09) s/p left mastectomy s/p 9 lymph node resection - [name], [place]  . Atrial fibrillation ([place])     a. s/p MAZE  . 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 AICD (automatic cardioverter/defibrillator) present   . CKD (chronic kidney disease), stage III   . Cellulitis and abscess of leg 05/01/2015    LEFT LEG  . Pneumonia   . CAP (community acquired pneumonia) 10/27/2015    Current Outpatient Prescriptions  Medication Sig Dispense Refill  . albuterol (PROVENTIL HFA;VENTOLIN HFA) 108 (90 BASE) MCG/ACT inhaler Inhale 1 puff into the lungs every 6 (six) hours as needed for wheezing or shortness of breath.    Marland Kitchen aspirin EC 81 MG tablet Take 81 mg by mouth every evening.    . cholecalciferol (VITAMIN D) 1000 UNITS tablet Take 1,000 Units by mouth daily.    . cyanocobalamin (,VITAMIN B-12,) 1000 MCG/ML injection Inject 1,000 mcg into the muscle every 30 (thirty) days. Takes on 23rd or 24th each month    . ferrous sulfate 325 (65 FE) MG tablet Take 1 tablet (325 mg total) by mouth daily  with breakfast. 30 tablet 0  . folic acid (FOLVITE) 1 MG tablet Take 1 mg by mouth 2 (two) times daily. Reported on 10/30/2015    . levothyroxine (SYNTHROID, LEVOTHROID) 137 MCG tablet Take 1 tablet by mouth daily.  2  . lisinopril (PRINIVIL,ZESTRIL) 5 MG tablet Take 2.5 mg by mouth daily.    Marland Kitchen LORazepam (ATIVAN) 1 MG tablet Take 2 mg by mouth at bedtime.   3  . Melatonin 5 MG TABS Take 10 mg by mouth at bedtime.    . metoprolol tartrate (LOPRESSOR) 25 MG tablet Take 25 mg by mouth 2 (two) times daily.    . mirtazapine (REMERON) 15 MG tablet Take 15 mg by mouth at bedtime.     Marland Kitchen oxyCODONE-acetaminophen (PERCOCET) 7.5-325 MG per tablet Take  1 tablet by mouth every 4 (four) hours as needed for moderate pain.     . pantoprazole (PROTONIX) 40 MG tablet Take 40 mg by mouth daily as needed (for heartburn).    . potassium chloride SA (K-DUR,KLOR-CON) 20 MEQ tablet Take 20 mEq by mouth 3 (three) times daily.    . pramipexole (MIRAPEX) 0.25 MG tablet Take 0.25 mg by mouth. Take 1 tablet by mouth at 4 PM and 1 at bedtime.  3  . rosuvastatin (CRESTOR) 20 MG tablet Take 1 tablet (20 mg total) by mouth daily at 6 PM. 30 tablet 0  . sucralfate (CARAFATE) 1 G tablet Take 1 g by mouth 4 (four) times daily - with meals and at bedtime.    . temazepam (RESTORIL) 30 MG capsule Take 1 capsule by mouth at bedtime.  1  . torsemide (DEMADEX) 20 MG tablet Take 20 mg by mouth 3 (three) times daily.    . nitroGLYCERIN (NITROSTAT) 0.4 MG SL tablet Place 0.4 mg under the tongue every 5 (five) minutes as needed for chest pain. Reported on 01/04/2016     No current facility-administered medications for this encounter.    Filed Vitals:   01/04/16 1113  Weight: 175 lb 3.2 oz (79.47 kg)   PHYSICAL EXAM: General: Chronically ill appearing. No acute distress.No resp difficulty; daughter present. In wheelchair HEENT: normal Neck:  supple. JVP 14+ cm with +CV waves. Carotids 2+ bilaterally; no bruits. No thryomegaly appreciated. Cor: PMI normal. RRR. No rubs, gallops. 3/6 HSM LLSB.  Lungs: Basilar crackles.  Abdomen: soft, obese, NT, ND, no HSM. No bruits or masses. +BS  Extremities: 2-3+ edema into thighs. Cellulitic changes bilaterally.  Neuro: alert & orientedx3, cranial nerves grossly intact. Moves all 4 extremities w/o difficulty. Affect pleasant.   ASSESSMENT & PLAN:  1) Chronic diastolic HF: EF 0000000 with moderate RV dysfunction and moderate to severe TR (moderate by 4/16 echo). Suspect that there is a prominent component of RV failure in the face of significant TR.  - She is markedly volume overloaded with JVP to ear and NYHA Class IIIb symptoms on exam. She is up 20-30 lbs from previous baseline.  - Admit for IV diuresis. - Will use lasix gtt at 15 mg/hr with 2.5 mg metolazone daily. SUPP K AGGRESSIVELY. Pt has hx of going into VT with low K.  - Consider PICC line for CVP with aggressive diuresis.  - Repeat Echo. - Will have full device interrogation once admitted.  2) Chest Pain:  - No CP currently. Last cath 2012 with minimal CAD. 3) Atrial Fibrillation:  - Paroxysmal, s/p Maze.  - EKG today shows A-paced rhythm with prolonged AV conduction.  - She has not been anticoagulated due to fall risk.  4) Tricuspid regurgitation: - Moderate on last echo in 4/16.  - Will repeat echo.  5) Mitral valve disease: Patient had bioprosthetic MV replacement. MV looked ok on last echo.  - Echo as above.  6) VT: Recent polymorphic VT in setting of prolonged QT interval.  - Continue BB. She needs to be careful with new medications and avoid meds that prolong the QT interval.  - Will follow K and Mg with diuresis. Will supp K aggressively as needed.   As above admitting for IV diuresis.   Satira Mccallum Tillery PA-C 01/04/2016  Advanced Heart Failure Team Pager [phone] (M-F; 7a -  4p)  Please contact [facility] for night-coverage after hours (4p -7a ) and weekends on [email or web address]   Patient  seen and examined with Oda Kilts, PA-C. We discussed all aspects of the encounter. I agree with the assessment and plan as stated above.   She is massively volume overloaded with at least 30 pounds of fluid on board. Admit for IV diuresis with lasix gtt. Watch K closely as she get VT if it goes too low. Currently in NSR. Not candidate for [facility] due to falls.   Faryn Sieg,MD 3:37 PM

## 2016-01-04 NOTE — Addendum Note (Signed)
Encounter addended by: Kerry Dory, CMA on: 01/04/2016 12:03 PM<BR>     Documentation filed: [employer], Orders

## 2016-01-04 NOTE — Progress Notes (Signed)
IV team unable to place PICC due to left mastectomy and right ICD.[name] made aware, plan for central line placement tomorrow.[name]

## 2016-01-05 ENCOUNTER — Ambulatory Visit (HOSPITAL_COMMUNITY): Payer: Medicare Other

## 2016-01-05 DIAGNOSIS — I509 Heart failure, unspecified: Secondary | ICD-10-CM

## 2016-01-05 DIAGNOSIS — E876 Hypokalemia: Secondary | ICD-10-CM

## 2016-01-05 LAB — CBC WITH DIFFERENTIAL/PLATELET
BASOS ABS: 0.1 10*3/uL (ref 0.0–0.1)
Basophils Relative: 1 %
Eosinophils Absolute: 0.3 10*3/uL (ref 0.0–0.7)
Eosinophils Relative: 4 %
HEMATOCRIT: 29.4 % — AB (ref 36.0–46.0)
Hemoglobin: 9.3 g/dL — ABNORMAL LOW (ref 12.0–15.0)
LYMPHS ABS: 1.3 10*3/uL (ref 0.7–4.0)
LYMPHS PCT: 16 %
MCH: 27.9 pg (ref 26.0–34.0)
MCHC: 31.6 g/dL (ref 30.0–36.0)
MCV: 88.3 fL (ref 78.0–100.0)
MONO ABS: 1.1 10*3/uL — AB (ref 0.1–1.0)
MONOS PCT: 14 %
NEUTROS ABS: 5 10*3/uL (ref 1.7–7.7)
Neutrophils Relative %: 65 %
Platelets: 172 10*3/uL (ref 150–400)
RBC: 3.33 MIL/uL — ABNORMAL LOW (ref 3.87–5.11)
RDW: 17.3 % — AB (ref 11.5–15.5)
WBC: 7.7 10*3/uL (ref 4.0–10.5)

## 2016-01-05 LAB — BASIC METABOLIC PANEL
ANION GAP: 9 (ref 5–15)
BUN: 19 mg/dL (ref 6–20)
CHLORIDE: 99 mmol/L — AB (ref 101–111)
CO2: 26 mmol/L (ref 22–32)
Calcium: 8.9 mg/dL (ref 8.9–10.3)
Creatinine, Ser: 1.06 mg/dL — ABNORMAL HIGH (ref 0.44–1.00)
GFR calc non Af Amer: 51 mL/min — ABNORMAL LOW (ref >60)
GFR, EST AFRICAN AMERICAN: 59 mL/min — AB (ref >60)
Glucose, Bld: 122 mg/dL — ABNORMAL HIGH (ref 65–99)
Potassium: 3.7 mmol/L (ref 3.5–5.1)
SODIUM: 134 mmol/L — AB (ref 135–145)

## 2016-01-05 LAB — GLUCOSE, CAPILLARY: GLUCOSE-CAPILLARY: 91 mg/dL (ref 65–99)

## 2016-01-05 LAB — MAGNESIUM: Magnesium: 2.2 mg/dL (ref 1.7–2.4)

## 2016-01-05 LAB — TSH: TSH: 75.572 u[IU]/mL — ABNORMAL HIGH (ref 0.350–4.500)

## 2016-01-05 MED ORDER — POTASSIUM CHLORIDE CRYS ER 20 MEQ PO TBCR
20.0000 meq | EXTENDED_RELEASE_TABLET | Freq: Once | ORAL | Status: AC
  Administered 2016-01-05: 20 meq via ORAL
  Filled 2016-01-05: qty 1

## 2016-01-05 MED ORDER — SPIRONOLACTONE 25 MG PO TABS
25.0000 mg | ORAL_TABLET | Freq: Every day | ORAL | Status: DC
  Administered 2016-01-05 – 2016-01-07 (×3): 25 mg via ORAL
  Filled 2016-01-05 (×3): qty 1

## 2016-01-05 NOTE — Progress Notes (Signed)
Report called to RN on 3E. Patient transferred with all belongings at bedside to 3E12. Attempted to call patient's daughter but no answer.

## 2016-01-05 NOTE — Progress Notes (Signed)
*  PRELIMINARY RESULTS* Echocardiogram 2D Echocardiogram has been performed.  [name] 01/05/2016, 10:17 AM

## 2016-01-05 NOTE — Progress Notes (Signed)
Advanced Heart Failure Rounding Note  PCP: Dr Jerolyn Center EP: Dr Lovena Le HF: Dr. Haroldine Laws   Subjective:    States already feeling much better. Breathing improved.  Denies SOB or CP.  No lightheadedness or dizziness but has not gotten out of bed yet.  States she normally walks around and is somewhat independent with ADLs at home.   Out > 2L thus far.  Weight shows down 7 lbs but 2 different scales used. (Clinic to floor). K 3.7, not given supp last night.   Objective:   Weight Range: 168 lb 12.8 oz (76.567 kg) Body mass index is 30.87 kg/(m^2).   Vital Signs:   Temp:  [97.9 F (36.6 C)-98.3 F (36.8 C)] 98.1 F (36.7 C) (02/24 0340) Pulse Rate:  [70-72] 70 (02/24 0340) Resp:  [17-29] 17 (02/24 0340) BP: (96-118)/(45-75) 96/45 mmHg (02/24 0340) SpO2:  [97 %-100 %] 99 % (02/24 0340) Weight:  [168 lb 12.8 oz (76.567 kg)] 168 lb 12.8 oz (76.567 kg) (02/24 0630)    Weight change: Filed Weights   01/05/16 0630  Weight: 168 lb 12.8 oz (76.567 kg)    Intake/Output:   Intake/Output Summary (Last 24 hours) at 01/05/16 0714 Last data filed at 01/05/16 0631  Gross per 24 hour  Intake  278.5 ml  Output   2650 ml  Net -2371.5 ml     Physical Exam: General: Elderly and chronically ill appearing.NAD HEENT: normal Neck: supple. JVP to ear +CV waves. Carotids 2+ bilaterally; no bruits. No thyromegaly or nodule noted.  Cor: PMI normal. RRR. No rubs, gallops. 3/6 HSM LLSB.  Lungs:Bibasilar crackles.  Abdomen: soft, obese, NT, ND, no HSM. No bruits or masses. +BS  Extremities: 2-3+ edema into thighs. Cellulitic changes bilaterally.  Neuro: alert & orientedx3, cranial nerves grossly intact. Moves all 4 extremities w/o difficulty. Affect pleasant.   Telemetry: Reviewed personally, A paced 70s  Labs: CBC  Recent Labs  01/04/16 1604 01/05/16 0400  WBC 7.2 7.7  NEUTROABS  --  5.0  HGB 9.9* 9.3*  HCT 32.3* 29.4*  MCV 86.6 88.3  PLT 194 [id number]   Basic Metabolic  Panel  Recent Labs  01/04/16 1218 01/05/16 0400  NA 139 134*  K 4.3 3.7  CL 101 99*  CO2 27 26  GLUCOSE 89 122*  BUN 20 19  CREATININE 1.31* 1.06*  CALCIUM 9.1 8.9  MG 1.6* 2.2   Liver Function Tests  Recent Labs  01/04/16 1218  AST 23  ALT 10*  ALKPHOS 85  BILITOT 1.1  PROT 7.3  ALBUMIN 3.1*   No results for input(s): LIPASE, AMYLASE in the last 72 hours. Cardiac Enzymes No results for input(s): CKTOTAL, CKMB, CKMBINDEX, TROPONINI in the last 72 hours.  BNP: BNP (last 3 results)  Recent Labs  03/28/15 1600 05/03/15 0432 01/04/16 1219  BNP 220.7* 191.9* 227.3*    ProBNP (last 3 results) No results for input(s): PROBNP in the last 8760 hours.   D-Dimer No results for input(s): DDIMER in the last 72 hours. Hemoglobin A1C No results for input(s): HGBA1C in the last 72 hours. Fasting Lipid Panel No results for input(s): CHOL, HDL, LDLCALC, TRIG, CHOLHDL, LDLDIRECT in the last 72 hours. Thyroid Function Tests  Recent Labs  01/05/16 0400  TSH 75.572*    Other results:     Imaging/Studies:   No results found.  Latest Echo  Latest Cath   Medications:     Scheduled Medications: . antiseptic oral rinse  7 mL Mouth  Rinse BID  . aspirin EC  81 mg Oral QPM  . cholecalciferol  1,000 Units Oral Daily  . enoxaparin (LOVENOX) injection  40 mg Subcutaneous Q24H  . ferrous sulfate  325 mg Oral Q breakfast  . folic acid  1 mg Oral BID  . levothyroxine  137 mcg Oral QAC breakfast  . lisinopril  2.5 mg Oral Daily  . LORazepam  2 mg Oral QHS  . Melatonin  10.5 mg Oral QHS  . metolazone  2.5 mg Oral Daily  . metoprolol tartrate  25 mg Oral BID  . mirtazapine  15 mg Oral QHS  . potassium chloride  40 mEq Oral BID  . pramipexole  0.25 mg Oral 2 times per day  . rosuvastatin  20 mg Oral q1800  . sodium chloride flush  3 mL Intravenous Q12H  . sucralfate  1 g Oral TID WC & HS     Infusions: . furosemide (LASIX) infusion 15 mg/hr (01/05/16  0151)     PRN Medications:  sodium chloride, acetaminophen, albuterol, nitroGLYCERIN, oxyCODONE-acetaminophen, pantoprazole, sodium chloride flush   Assessment   1) Acute on chronic diastolic HF 2) Chest Pain 3) Atrial Fibrillation 4) Tricuspid regurgitation 5) Mitral valve disease 6) Hx of VT 7) Deconditioning 8) Hypokalemia  Plan    Admitted [id number] with A/C diastolic HF.   Likely 20-30 lbs up in setting of non-compliance with HF team visits.   She has a tendency to go into VT with electrolyte imbalances.  Will supp K and Mg aggressively for goal of > 4.0 and 2.0, respectively. Currently in NSR by tele.   Will give extra 20 K today.   Repeat Echo pending.  PT to see for deconditioning.  Will have WOC look at cellulitic changes and seeping wounds on LEs.   Length of Stay: 1  Shirley Friar PA-C 01/05/2016, 7:14 AM  Advanced Heart Failure Team Pager [phone] (M-F; 7a - 4p)  Please contact [facility] for night-coverage after hours (4p -7a ) and weekends on [email or web address]  Patient seen and examined with Oda Kilts, PA-C. We discussed all aspects of the encounter. I agree with the assessment and plan as stated above.   Diuresing well. Renal function improved. K 3.7 will supp. Tele reviewed rhythm stable. Add spiro to protect K given history of VT with low K   Still with marked volume overload. Continue diuresis. Agree with rehab and wound care. Echo pending.   Gail Creekmore,MD 8:37 AM

## 2016-01-05 NOTE — Consult Note (Signed)
WOC wound consult note Reason for Consult: weeping bilateral LEs.  Met with patient, it is apparent she had Unna's boots on her bilateral LE's from the left over zinc on the extremities.  She reports they were placed by a [facility] a week ago and were removed upon admission to the hospital yesterday.  She reports "I didn't like them, they were too tight". She has bilateral strong PT/DP pulses and 2+ pitting edema. Some redness but not significant.  Two areas on the LLE, ruptured bulla and a couple of small scattered lesions on the RLE with serous weeping.  Wound type: venous stasis Wound bed: ruptured bulla but clean Drainage (amount, consistency, odor) serous Periwound: intact  Dressing procedure/placement/frequency: Discussed use of Profore 4 layer compression with patient, I will apply and adjust the compression a bit in order for the patient to better tolerate, she is in agreement to try this.  Silicone foam to the open areas to protect and manage drainage.    Bedside nurse applied silicone foam to the open areas, WOC applied 4 layer compression wraps.  Patient able to flex foot without discomfort.  Will continue to have bedside nursing staff monitor.   Change topical dressings and compression 2x wk Tues/Friday.  Will need to continue [facility] for compression wrap changes 2x wk and to monitor for any new ulcers and/or improvement of current.   Bedside nurse requested WOC to assess site on right buttock Wound type: Stage 2 Pressure Injury Pressure Ulcer POA: Yes Measurement: 3cm x 1cm x 0.2cm  Wound bed:60% pink/ 40% yellow Drainage (amount, consistency, odor) minimal, serosanguinous  Periwound: intact, but reddened, blanches  Dressing procedure/placement/frequency: Continue silicone foam for protection and moist wound healing.  Would encourage turning every 2 hours to lessen pressure at site.  Patient turns well for [place] today therefore I will not add LALM at this time.  Add chair pressure  redistribution pad for use in room when up in chair and at home.  WOC will follow along with you for compression wrap changes 2x wk unless WTA available on the unit when due for change.  Divine Hansley [place]wood RN,CWOCN [phone]

## 2016-01-05 NOTE — Plan of Care (Signed)
Problem: Acute Rehab PT Goals(only PT should resolve) Goal: Pt Will Go Supine/Side To Sit HOB to 30 degrees

## 2016-01-05 NOTE — Evaluation (Signed)
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     Clinical Impression  Pt admitted with above diagnosis. Pt currently with functional limitations due to the deficits listed below (see PT Problem List). Pt was able to walk out in the hallway but did not want to walk far due to fatigue. Pt states she is able to move around better at baseline. Pt ambulated on room air with SaO2 at 95% or above but dropped to 90% after ambulation so 2L [place] was reapplied. Pt will benefit from skilled PT to increase their independence and safety with mobility to allow discharge back to home. Recommending HHPT to continue to work towards PLOF and continue to decrease fall risk at home.       Follow Up Recommendations Home health PT;Supervision/Assistance - 24 hour    Equipment Recommendations  None recommended by PT    Recommendations for Other Services       Precautions / Restrictions Precautions Precautions: Fall Restrictions Weight Bearing Restrictions: No      Mobility  Bed Mobility Overal bed mobility: Needs Assistance Bed Mobility: Supine to Sit     Supine to sit: Min assist;HOB elevated     General bed mobility comments: Pt overall moves pretty well in the bed, just needed a little bit of assistance to bring her trunk upright. She states that she sleeps propped on 3-4 pillows at home.   Transfers Overall transfer level: Needs assistance Equipment used: 1 person hand held assist Transfers: Sit to/from Stand Sit to Stand: Min guard         General transfer comment: Pt did not need  physical assitance to stand, just a hand hold assist for steadines.   Ambulation/Gait Ambulation/Gait assistance: Min assist Ambulation Distance (Feet): 40 Feet Assistive device: 1 person hand held assist Gait Pattern/deviations: Step-through pattern;Decreased stride length;Trunk flexed Gait velocity: decreased Gait velocity interpretation: Below normal speed for age/gender General Gait Details: Pt needed one hand hold assist for minimal balance assistance.   Stairs            Wheelchair Mobility    Modified Rankin (Stroke Patients Only)       Balance Overall balance assessment: Needs assistance Sitting-balance support: Bilateral upper extremity supported;Feet supported Sitting balance-Leahy Scale: Poor Sitting balance - Comments: Pt's trunk fatigues very quickly sitting on the EOB. Can only tolerate sitting unsupported for short bouts.  Postural control: Posterior lean Standing balance support: Single extremity supported Standing balance-Leahy Scale: Fair Standing balance comment: Needs one hand hold assist for min stability.              High level balance activites: Turns High Level Balance Comments: Pt able to turn with no LOB with one hand hold assist for stability.              Pertinent Vitals/Pain Pain Assessment: 0-10 Pain Score: 8  Pain Location: Head Pain Descriptors / Indicators: Aching Pain Intervention(s): Monitored during session  Pre-Activity: HR: 70 bpm, O2 Sat: 98% on room air, RR: 17 bpm During-Activity: HR: 88 bpm, O2 Sat: 95 % on room air  Post-Activity: HR: 71 bpm, O2 Sat: 95%  on 2L [place], RR: 23 bpm      Home Living Family/patient expects to be discharged to:: Private residence Living Arrangements: Spouse/significant other Available Help at Discharge: Family Type of Home: House Home Access: Level entry     Home Layout: One level Home Equipment: Environmental consultant - 4 wheels Additional Comments: Pt states that she does not use an AD  normally but has a rollator for long distances in the community.     Prior Function Level of Independence: Independent               Hand Dominance        Extremity/Trunk Assessment   Upper Extremity Assessment: Generalized weakness           Lower Extremity Assessment: Generalized weakness      Cervical / Trunk Assessment: Kyphotic (Pt unable to bring head upright, States she has arthritis)  Communication   Communication: No difficulties  Cognition Arousal/Alertness: Awake/alert Behavior During Therapy: WFL for tasks assessed/performed Overall Cognitive Status: Within Functional Limits for tasks assessed                      General Comments General comments (skin integrity, edema, etc.): Pt had bilateral unna boots below the knee. Pt is unable to hold her head up, which she states is her baseline due to arthritis.     Exercises        Assessment/Plan    PT Assessment Patient needs continued PT services  PT Diagnosis Abnormality of gait;Generalized weakness   PT Problem List Decreased strength;Decreased range of motion;Decreased activity tolerance;Decreased balance;Decreased mobility;Cardiopulmonary status limiting activity  PT Treatment Interventions Gait training;Functional mobility training;Therapeutic exercise;Therapeutic activities;Balance training;Patient/family education   PT Goals (Current goals can be found in the Care Plan section) Acute Rehab PT Goals Patient Stated Goal: To go home.  PT Goal Formulation: With patient Time For Goal Achievement: 01/19/16 Potential to Achieve Goals: Good    Frequency Min 3X/week   Barriers to discharge        Co-evaluation               End of Session Equipment Utilized During Treatment: Gait belt Activity Tolerance: Patient tolerated treatment well Patient left: in chair;with call bell/phone within reach Nurse Communication: Mobility status         Time: [id number] PT Time Calculation  (min) (ACUTE ONLY): 18 min   Charges:   PT Evaluation $PT Eval Moderate Complexity: 1 Procedure     PT G Codes:       Colon Branch, SPT Colon Branch 01/05/2016, 4:29 PM

## 2016-01-06 LAB — BASIC METABOLIC PANEL
Anion gap: 6 (ref 5–15)
BUN: 18 mg/dL (ref 6–20)
CALCIUM: 9.3 mg/dL (ref 8.9–10.3)
CHLORIDE: 100 mmol/L — AB (ref 101–111)
CO2: 30 mmol/L (ref 22–32)
CREATININE: 1.18 mg/dL — AB (ref 0.44–1.00)
GFR calc non Af Amer: 45 mL/min — ABNORMAL LOW (ref >60)
GFR, EST AFRICAN AMERICAN: 52 mL/min — AB (ref >60)
GLUCOSE: 91 mg/dL (ref 65–99)
Potassium: 4.7 mmol/L (ref 3.5–5.1)
Sodium: 136 mmol/L (ref 135–145)

## 2016-01-06 NOTE — Progress Notes (Signed)
   01/05/16 2103  Vitals  Temp 97.9 F (36.6 C)  Temp Source Oral  BP (!) 123/48 mmHg  BP Location Right Arm  Pulse Rate 70  Pulse Rate Source Dinamap  Resp 18  Oxygen Therapy  SpO2 98 %  O2 Device Nasal Cannula  O2 Flow Rate (L/min) 2 L/min  Height and Weight  Height 5\' 2"  (1.575 m)  Weight 76.2 kg (167 lb 15.9 oz)  Type of Scale Used Standing  BSA (Calculated - sq m) 1.83 sq meters  BMI (Calculated) 30.8  Weight in (lb) to have BMI = 25 136.4  Received pt to rm 3E12 from [place], pt alert and oriented, denied pain at this time, oriented to room, call bell placed within reach, pt placed on cardiac monitor and CCMD made aware.

## 2016-01-06 NOTE — Progress Notes (Signed)
PHARMACIST - PHYSICIAN ORDER COMMUNICATION  CONCERNING: P&T Medication Policy on Herbal Medications  DESCRIPTION:  This patient's order for:  melatonin has been noted.  This product(s) is classified as an "herbal" or natural product. Due to a lack of definitive safety studies or FDA approval, nonstandard manufacturing practices, plus the potential risk of unknown drug-drug interactions while on inpatient medications, the Pharmacy and Therapeutics Committee does not permit the use of "herbal" or natural products of this type within [facility].   ACTION TAKEN: The pharmacy department is unable to verify this order at this time and your patient has been informed of this safety policy. Please reevaluate patient's clinical condition at discharge and address if the herbal or natural product(s) should be resumed at that time.  Salome Arnt, PharmD, BCPS Pager # [phone] 01/06/2016 4:28 PM

## 2016-01-06 NOTE — Progress Notes (Signed)
Advanced Heart Failure Rounding Note  PCP: Dr Jerolyn Center EP: Dr Lovena Le HF: Dr. Haroldine Laws   Subjective:    States already feeling much better. Breathing improved.  Currently without shortness of breath, lightheadedness or dizziness.  Is in the chair today but no more ambulation.  States she normally walks around and is somewhat independent with ADLs at home.   Out > 7L thus far.    Objective:   Weight Range: 167 lb 6.3 oz (75.93 kg) Body mass index is 30.61 kg/(m^2).   Vital Signs:   Temp:  [97.9 F (36.6 C)-99.1 F (37.3 C)] 98.5 F (36.9 C) (02/25 0527) Pulse Rate:  [68-70] 68 (02/25 1011) Resp:  [14-21] 16 (02/25 0527) BP: (90-123)/(48-64) 104/64 mmHg (02/25 1011) SpO2:  [98 %-99 %] 99 % (02/25 0527) Weight:  [167 lb 6.3 oz (75.93 kg)-167 lb 15.9 oz (76.2 kg)] 167 lb 6.3 oz (75.93 kg) (02/25 0527) Last BM Date: 01/05/16  Weight change: Filed Weights   01/05/16 0630 01/05/16 2103 01/06/16 0527  Weight: 168 lb 12.8 oz (76.567 kg) 167 lb 15.9 oz (76.2 kg) 167 lb 6.3 oz (75.93 kg)    Intake/Output:   Intake/Output Summary (Last 24 hours) at 01/06/16 1146 Last data filed at 01/06/16 1000  Gross per 24 hour  Intake   1035 ml  Output   5445 ml  Net  -4410 ml     Physical Exam: General: Elderly and chronically ill appearing.NAD HEENT: normal Neck: supple. JVP to ear +CV waves. Carotids 2+ bilaterally; no bruits. No thyromegaly or nodule noted.  Cor: PMI normal. RRR. No rubs, gallops. 3/6 HSM LLSB.  Lungs:Bibasilar crackles.  Abdomen: soft, obese, NT, ND, no HSM. No bruits or masses. +BS  Extremities: 2-3+ edema into thighs. Cellulitic changes bilaterally.  Neuro: alert & orientedx3, cranial nerves grossly intact. Moves all 4 extremities w/o difficulty. Affect pleasant.   Telemetry: Reviewed personally, A paced 70s  Labs: CBC  Recent Labs  01/04/16 1604 01/05/16 0400  WBC 7.2 7.7  NEUTROABS  --  5.0  HGB 9.9* 9.3*  HCT 32.3* 29.4*  MCV 86.6 88.3    PLT 194 [id number]   Basic Metabolic Panel  Recent Labs  01/04/16 1218 01/05/16 0400 01/06/16 0317  NA 139 134* 136  K 4.3 3.7 4.7  CL 101 99* 100*  CO2 27 26 30   GLUCOSE 89 122* 91  BUN 20 19 18   CREATININE 1.31* 1.06* 1.18*  CALCIUM 9.1 8.9 9.3  MG 1.6* 2.2  --    Liver Function Tests  Recent Labs  01/04/16 1218  AST 23  ALT 10*  ALKPHOS 85  BILITOT 1.1  PROT 7.3  ALBUMIN 3.1*   No results for input(s): LIPASE, AMYLASE in the last 72 hours. Cardiac Enzymes No results for input(s): CKTOTAL, CKMB, CKMBINDEX, TROPONINI in the last 72 hours.  BNP: BNP (last 3 results)  Recent Labs  03/28/15 1600 05/03/15 0432 01/04/16 1219  BNP 220.7* 191.9* 227.3*    ProBNP (last 3 results) No results for input(s): PROBNP in the last 8760 hours.   D-Dimer No results for input(s): DDIMER in the last 72 hours. Hemoglobin A1C No results for input(s): HGBA1C in the last 72 hours. Fasting Lipid Panel No results for input(s): CHOL, HDL, LDLCALC, TRIG, CHOLHDL, LDLDIRECT in the last 72 hours. Thyroid Function Tests  Recent Labs  01/05/16 0400  TSH 75.572*    Other results:     Imaging/Studies:  No results found.  Latest Echo  Latest Cath   Medications:     Scheduled Medications: . antiseptic oral rinse  7 mL Mouth Rinse BID  . aspirin EC  81 mg Oral QPM  . cholecalciferol  1,000 Units Oral Daily  . enoxaparin (LOVENOX) injection  40 mg Subcutaneous Q24H  . ferrous sulfate  325 mg Oral Q breakfast  . folic acid  1 mg Oral BID  . levothyroxine  137 mcg Oral QAC breakfast  . lisinopril  2.5 mg Oral Daily  . LORazepam  2 mg Oral QHS  . Melatonin  10.5 mg Oral QHS  . metolazone  2.5 mg Oral Daily  . metoprolol tartrate  25 mg Oral BID  . mirtazapine  15 mg Oral QHS  . potassium chloride  40 mEq Oral BID  . pramipexole  0.25 mg Oral 2 times per day  . rosuvastatin  20 mg Oral q1800  . sodium chloride flush  3 mL Intravenous Q12H  . spironolactone  25 mg  Oral Daily  . sucralfate  1 g Oral TID WC & HS    Infusions: . furosemide (LASIX) infusion 15 mg/hr (01/05/16 2012)    PRN Medications: sodium chloride, acetaminophen, albuterol, nitroGLYCERIN, oxyCODONE-acetaminophen, pantoprazole, sodium chloride flush   Assessment   1) Acute on chronic diastolic HF 2) Chest Pain 3) Atrial Fibrillation 4) Tricuspid regurgitation 5) Mitral valve disease 6) Hx of VT 7) Deconditioning 8) Hypokalemia  Plan    Admitted 123456 with A/C diastolic HF.   Likely 20-30 lbs up in setting of non-compliance with HF team visits.   She has a tendency to go into VT with electrolyte imbalances.  K normalized today.  Currently in NSR by tele.    Repeat Echo EF 50-55%, normally functioning MVR  PT to see for deconditioning.  Omair Dettmer need continued diuresis, Milisa Kimbell check BMP tomorrow and repleat K and Mg as needed.  Length of Stay: 2  Ryland Tungate Meredith Leeds MD  01/06/2016, 11:46 AM

## 2016-01-07 LAB — BASIC METABOLIC PANEL
Anion gap: 12 (ref 5–15)
BUN: 17 mg/dL (ref 6–20)
CALCIUM: 10.6 mg/dL — AB (ref 8.9–10.3)
CO2: 33 mmol/L — AB (ref 22–32)
Chloride: 94 mmol/L — ABNORMAL LOW (ref 101–111)
Creatinine, Ser: 1.39 mg/dL — ABNORMAL HIGH (ref 0.44–1.00)
GFR calc Af Amer: 43 mL/min — ABNORMAL LOW (ref >60)
GFR, EST NON AFRICAN AMERICAN: 37 mL/min — AB (ref >60)
GLUCOSE: 101 mg/dL — AB (ref 65–99)
Potassium: 4.9 mmol/L (ref 3.5–5.1)
Sodium: 139 mmol/L (ref 135–145)

## 2016-01-07 LAB — GLUCOSE, CAPILLARY
GLUCOSE-CAPILLARY: 115 mg/dL — AB (ref 65–99)
GLUCOSE-CAPILLARY: 110 mg/dL — AB (ref 65–99)
Glucose-Capillary: 92 mg/dL (ref 65–99)
Glucose-Capillary: 119 mg/dL — ABNORMAL HIGH (ref 65–99)

## 2016-01-07 LAB — MAGNESIUM: Magnesium: 1.8 mg/dL (ref 1.7–2.4)

## 2016-01-07 MED ORDER — TORSEMIDE 20 MG PO TABS
60.0000 mg | ORAL_TABLET | Freq: Every day | ORAL | Status: DC
  Administered 2016-01-08 – 2016-01-09 (×2): 60 mg via ORAL
  Filled 2016-01-07 (×3): qty 3

## 2016-01-07 NOTE — Progress Notes (Signed)
Advanced Heart Failure Rounding Note  PCP: Dr Jerolyn Center EP: Dr Lovena Le HF: Dr. Haroldine Laws   Subjective:    States already feeling much better. Breathing improved.  Currently without shortness of breath, lightheadedness or dizziness. Is in the bed today currently. Out > 9.3L thus far.    Objective:   Weight Range: 151 lb 8 oz (68.72 kg) Body mass index is 27.7 kg/(m^2).   Vital Signs:   Temp:  [98.2 F (36.8 C)-98.6 F (37 C)] 98.6 F (37 C) (02/26 0559) Pulse Rate:  [69-70] 69 (02/26 0559) Resp:  [18-19] 18 (02/26 0559) BP: (97-112)/(42-56) 110/56 mmHg (02/26 1012) SpO2:  [99 %-100 %] 100 % (02/26 0559) Weight:  [151 lb 8 oz (68.72 kg)] 151 lb 8 oz (68.72 kg) (02/26 0559) Last BM Date: 01/05/16  Weight change: Filed Weights   01/05/16 2103 01/06/16 0527 01/07/16 0559  Weight: 167 lb 15.9 oz (76.2 kg) 167 lb 6.3 oz (75.93 kg) 151 lb 8 oz (68.72 kg)    Intake/Output:   Intake/Output Summary (Last 24 hours) at 01/07/16 1024 Last data filed at 01/07/16 1000  Gross per 24 hour  Intake    780 ml  Output   2875 ml  Net  -2095 ml     Physical Exam: General: Elderly and chronically ill appearing.NAD HEENT: normal Neck: supple. JVP to ear +CV waves. Carotids 2+ bilaterally; no bruits. No thyromegaly or nodule noted.  Cor: PMI normal. RRR. No rubs, gallops. 3/6 HSM LLSB.  Lungs:Bibasilar crackles.  Abdomen: soft, obese, NT, ND, no HSM. No bruits or masses. +BS  Extremities: 2-3+ edema into thighs. Cellulitic changes bilaterally.  Neuro: alert & orientedx3, cranial nerves grossly intact. Moves all 4 extremities w/o difficulty. Affect pleasant.   Telemetry: Reviewed personally, A paced 70s  Labs: CBC  Recent Labs  01/04/16 1604 01/05/16 0400  WBC 7.2 7.7  NEUTROABS  --  5.0  HGB 9.9* 9.3*  HCT 32.3* 29.4*  MCV 86.6 88.3  PLT 194 [id number]   Basic Metabolic Panel  Recent Labs  01/05/16 0400 01/06/16 0317 01/07/16 0348  NA 134* 136 139  K 3.7 4.7 4.9    CL 99* 100* 94*  CO2 26 30 33*  GLUCOSE 122* 91 101*  BUN 19 18 17   CREATININE 1.06* 1.18* 1.39*  CALCIUM 8.9 9.3 10.6*  MG 2.2  --  1.8   Liver Function Tests  Recent Labs  01/04/16 1218  AST 23  ALT 10*  ALKPHOS 85  BILITOT 1.1  PROT 7.3  ALBUMIN 3.1*   No results for input(s): LIPASE, AMYLASE in the last 72 hours. Cardiac Enzymes No results for input(s): CKTOTAL, CKMB, CKMBINDEX, TROPONINI in the last 72 hours.  BNP: BNP (last 3 results)  Recent Labs  03/28/15 1600 05/03/15 0432 01/04/16 1219  BNP 220.7* 191.9* 227.3*    ProBNP (last 3 results) No results for input(s): PROBNP in the last 8760 hours.   D-Dimer No results for input(s): DDIMER in the last 72 hours. Hemoglobin A1C No results for input(s): HGBA1C in the last 72 hours. Fasting Lipid Panel No results for input(s): CHOL, HDL, LDLCALC, TRIG, CHOLHDL, LDLDIRECT in the last 72 hours. Thyroid Function Tests  Recent Labs  01/05/16 0400  TSH 75.572*   Imaging/Studies:  No results found.  Latest Echo  Latest Cath   Medications:     Scheduled Medications: . antiseptic oral rinse  7 mL Mouth Rinse BID  . aspirin EC  81 mg Oral QPM  .  cholecalciferol  1,000 Units Oral Daily  . enoxaparin (LOVENOX) injection  40 mg Subcutaneous Q24H  . ferrous sulfate  325 mg Oral Q breakfast  . folic acid  1 mg Oral BID  . levothyroxine  137 mcg Oral QAC breakfast  . lisinopril  2.5 mg Oral Daily  . LORazepam  2 mg Oral QHS  . metolazone  2.5 mg Oral Daily  . metoprolol tartrate  25 mg Oral BID  . mirtazapine  15 mg Oral QHS  . potassium chloride  40 mEq Oral BID  . pramipexole  0.25 mg Oral 2 times per day  . rosuvastatin  20 mg Oral q1800  . sodium chloride flush  3 mL Intravenous Q12H  . spironolactone  25 mg Oral Daily  . sucralfate  1 g Oral TID WC & HS  . [START ON 01/08/2016] torsemide  60 mg Oral Daily    Infusions:    PRN Medications: sodium chloride, acetaminophen, albuterol,  nitroGLYCERIN, oxyCODONE-acetaminophen, pantoprazole, sodium chloride flush   Assessment   1) Acute on chronic diastolic HF 2) Chest Pain 3) Atrial Fibrillation 4) Tricuspid regurgitation 5) Mitral valve disease 6) Hx of VT 7) Deconditioning 8) Hypokalemia  Plan    Admitted 123456 with A/C diastolic HF.   Likely 20-30 lbs up in setting of non-compliance with HF team visits.   She has a tendency to go into VT with electrolyte imbalances.  K normalized today.  Currently in NSR by tele.    Repeat Echo EF 50-55%, normally functioning MVR  PT to see for deconditioning.  Weight is at baseline and has had creatinine increase.  Emmerson Taddei stop IV lasix and switch to torsemide PO first dose tomorrow.  Length of Stay: 3  Safiatou Islam Meredith Leeds MD  01/07/2016, 10:24 AM

## 2016-01-08 DIAGNOSIS — E875 Hyperkalemia: Secondary | ICD-10-CM

## 2016-01-08 LAB — BASIC METABOLIC PANEL
Anion gap: 11 (ref 5–15)
BUN: 18 mg/dL (ref 6–20)
CALCIUM: 10.6 mg/dL — AB (ref 8.9–10.3)
CHLORIDE: 90 mmol/L — AB (ref 101–111)
CO2: 33 mmol/L — AB (ref 22–32)
Creatinine, Ser: 1.43 mg/dL — ABNORMAL HIGH (ref 0.44–1.00)
GFR calc Af Amer: 41 mL/min — ABNORMAL LOW (ref >60)
GFR calc non Af Amer: 36 mL/min — ABNORMAL LOW (ref >60)
GLUCOSE: 103 mg/dL — AB (ref 65–99)
POTASSIUM: 5.6 mmol/L — AB (ref 3.5–5.1)
Sodium: 134 mmol/L — ABNORMAL LOW (ref 135–145)

## 2016-01-08 LAB — GLUCOSE, CAPILLARY
GLUCOSE-CAPILLARY: 104 mg/dL — AB (ref 65–99)
Glucose-Capillary: 108 mg/dL — ABNORMAL HIGH (ref 65–99)
Glucose-Capillary: 98 mg/dL (ref 65–99)

## 2016-01-08 LAB — MAGNESIUM: Magnesium: 1.7 mg/dL (ref 1.7–2.4)

## 2016-01-08 NOTE — Care Management Important Message (Signed)
Important Message  Patient Details  Name: [name] MRN: [id number] Date of Birth: 1941/07/08   Medicare Important Message Given:  Yes    Barb Merino Desire Fulp 01/08/2016, 4:41 PM

## 2016-01-08 NOTE — Progress Notes (Signed)
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     PT Comments    Pt reluctantly agreeable to work with PT.  Ambulated with min/guard with pt using rail or other surface at least 75% of the time for support. Con't to recommend HHPT.  Follow Up Recommendations  Home health PT;Supervision/Assistance - 24 hour     Equipment Recommendations  None recommended by PT    Recommendations for Other Services       Precautions / Restrictions Precautions Precautions: Fall Restrictions Weight Bearing Restrictions: No    Mobility  Bed Mobility Overal bed mobility: Modified Independent Bed Mobility: Supine to Sit     Supine to sit: Modified independent (Device/Increase time);HOB elevated     General bed mobility comments: Pt overall moves pretty well in the bed, just needed a little bit of assistance to bring her trunk upright. She states that she sleeps propped on 3-4 pillows at home.   Transfers Overall transfer level: Needs assistance Equipment used: None Transfers: Sit to/from Stand Sit to Stand: Min guard         General transfer comment: min/guard for safety  Ambulation/Gait Ambulation/Gait assistance: Min guard Ambulation Distance (Feet): 50 Feet Assistive device: None Gait Pattern/deviations: Decreased step length - right;Decreased step length - left;Trunk flexed;Narrow base of support Gait velocity: decreased   General Gait Details: Pt holding onto rail about 75% of gait.  Pt reports she does furniture cruise at home.   Stairs            Wheelchair  Mobility    Modified Rankin (Stroke Patients Only)       Balance     Sitting balance-Leahy Scale: Fair       Standing balance-Leahy Scale: Fair Standing balance comment: tends to reach out for rail, counter, etc.                    Cognition Arousal/Alertness: Awake/alert Behavior During Therapy: WFL for tasks assessed/performed Overall Cognitive Status: Within Functional Limits for tasks assessed                      Exercises      General Comments        Pertinent Vitals/Pain Pain Assessment: No/denies pain    Home Living                      Prior Function            PT Goals (current goals can now be found in the care plan section) Acute Rehab PT Goals Patient Stated Goal: To go home.  PT Goal Formulation: With patient Time For Goal Achievement: 01/19/16 Potential to Achieve Goals: Good Progress towards PT goals: Progressing toward goals    Frequency  Min 3X/week    PT Plan Current plan remains appropriate    Co-evaluation             End of Session Equipment Utilized During Treatment: Gait belt Activity Tolerance: Patient tolerated treatment well;Patient limited by fatigue Patient left: in chair;with call bell/phone within reach     Time: 1214-1225  PT Time Calculation (min) (ACUTE ONLY): 11 min  Charges:  $Gait Training: 8-22 mins                    G Codes:      Odella Appelhans LUBECK 01/08/2016, 12:48 PM

## 2016-01-08 NOTE — Progress Notes (Signed)
Advanced Heart Failure Rounding Note  PCP: Dr Jerolyn Center EP: Dr Lovena Le HF: Dr. Haroldine Laws   Subjective:    Feeling better overall. Breathing much improved. Out 11.5 L and down 23 lbs so far. Switched back to torsemide today with elevated creatinine over weekend. K 5.6.   Objective:   Weight Range: 145 lb 15.1 oz (66.2 kg) Body mass index is 26.69 kg/(m^2).   Vital Signs:   Temp:  [97.8 F (36.6 C)-98.7 F (37.1 C)] 98 F (36.7 C) (02/27 0603) Pulse Rate:  [69] 69 (02/27 0603) Resp:  [18] 18 (02/27 0603) BP: (104-114)/(39-56) 114/51 mmHg (02/27 0603) SpO2:  [98 %-100 %] 98 % (02/27 0603) Weight:  [145 lb 15.1 oz (66.2 kg)] 145 lb 15.1 oz (66.2 kg) (02/27 0603) Last BM Date: 01/05/16  Weight change: Filed Weights   01/06/16 0527 01/07/16 0559 01/08/16 0603  Weight: 167 lb 6.3 oz (75.93 kg) 151 lb 8 oz (68.72 kg) 145 lb 15.1 oz (66.2 kg)    Intake/Output:   Intake/Output Summary (Last 24 hours) at 01/08/16 0805 Last data filed at 01/08/16 0617  Gross per 24 hour  Intake    480 ml  Output   3500 ml  Net  -3020 ml     Physical Exam: General: Elderly and chronically ill appearing.NAD HEENT: normal Neck: supple. JVP 7-8. Carotids 2+ bilaterally; no bruits. No thyromegaly or nodule noted.  Cor: PMI normal. RRR. No rubs, gallops. 3/6 HSM LLSB.  Lungs: CTAB, normal effort Abdomen: soft, obese, NT, ND, no HSM. No bruits or masses. +BS  Extremities: trace ankle edema Cellulitic changes bilaterally.  Neuro: alert & orientedx3, cranial nerves grossly intact. Moves all 4 extremities w/o difficulty. Affect pleasant.   Telemetry: Reviewed personally, A paced 60-70s  Labs: CBC No results for input(s): WBC, NEUTROABS, HGB, HCT, MCV, PLT in the last 72 hours. Basic Metabolic Panel  Recent Labs  01/07/16 0348 01/08/16 0432  NA 139 134*  K 4.9 5.6*  CL 94* 90*  CO2 33* 33*  GLUCOSE 101* 103*  BUN 17 18  CREATININE 1.39* 1.43*  CALCIUM 10.6* 10.6*  MG 1.8 1.7     Liver Function Tests No results for input(s): AST, ALT, ALKPHOS, BILITOT, PROT, ALBUMIN in the last 72 hours. No results for input(s): LIPASE, AMYLASE in the last 72 hours. Cardiac Enzymes No results for input(s): CKTOTAL, CKMB, CKMBINDEX, TROPONINI in the last 72 hours.  BNP: BNP (last 3 results)  Recent Labs  03/28/15 1600 05/03/15 0432 01/04/16 1219  BNP 220.7* 191.9* 227.3*    ProBNP (last 3 results) No results for input(s): PROBNP in the last 8760 hours.   D-Dimer No results for input(s): DDIMER in the last 72 hours. Hemoglobin A1C No results for input(s): HGBA1C in the last 72 hours. Fasting Lipid Panel No results for input(s): CHOL, HDL, LDLCALC, TRIG, CHOLHDL, LDLDIRECT in the last 72 hours. Thyroid Function Tests No results for input(s): TSH, T4TOTAL, T3FREE, THYROIDAB in the last 72 hours.  Invalid input(s): FREET3 Imaging/Studies:  No results found.  Latest Echo  Latest Cath   Medications:     Scheduled Medications: . antiseptic oral rinse  7 mL Mouth Rinse BID  . aspirin EC  81 mg Oral QPM  . cholecalciferol  1,000 Units Oral Daily  . enoxaparin (LOVENOX) injection  40 mg Subcutaneous Q24H  . ferrous sulfate  325 mg Oral Q breakfast  . folic acid  1 mg Oral BID  . levothyroxine  137 mcg Oral  QAC breakfast  . lisinopril  2.5 mg Oral Daily  . LORazepam  2 mg Oral QHS  . metoprolol tartrate  25 mg Oral BID  . mirtazapine  15 mg Oral QHS  . pramipexole  0.25 mg Oral 2 times per day  . rosuvastatin  20 mg Oral q1800  . sodium chloride flush  3 mL Intravenous Q12H  . sucralfate  1 g Oral TID WC & HS  . torsemide  60 mg Oral Daily    Infusions:    PRN Medications: sodium chloride, acetaminophen, albuterol, nitroGLYCERIN, oxyCODONE-acetaminophen, pantoprazole, sodium chloride flush   Assessment   1) Acute on chronic diastolic HF 2) Chest Pain 3) Atrial Fibrillation 4) Tricuspid regurgitation 5) Mitral valve disease 6) Hx of VT 7)  Deconditioning 8) Hypokalemia  Plan    Admitted 123456 with A/C diastolic HF.   Thought to be 20-30 lbs up in setting of non-compliance with HF team visits.   She has a tendency to go into VT with electrolyte imbalances.  K over supplemented, at 5.6 today.  Hold potassium and Arlyce Harman. Currently in NSR by tele.  Repeat Echo 01/05/16 EF 50-55%, normally functioning MVR  PT following. Recommend HH PT.   Weight is at/near previous baseline and creatinine has bumped slightly. Transitioning to previous torsemide po today.   Likely home tomorrow once K normalized.   Length of Stay: 4  Shirley Friar PA-C 01/08/2016, 8:05 AM  Advanced Heart Failure Team Pager [phone] (M-F; 7a - 4p)  Please contact [facility] for night-coverage after hours (4p -7a ) and weekends on [email or web address]    Patient seen and examined with Oda Kilts, PA-C. We discussed all aspects of the encounter. I agree with the assessment and plan as stated above.   Volume status much improved but has significant hyperkalemia today. Renal function slightly worse. Will observe one more day and plan d/c tomorrow if stable. Hold Kcl and spiro. Continue PT for deconditioning. No VT on tele.   Sachiko Methot,MD 9:41 AM

## 2016-01-09 LAB — BASIC METABOLIC PANEL
Anion gap: 10 (ref 5–15)
BUN: 22 mg/dL — ABNORMAL HIGH (ref 6–20)
CALCIUM: 10.9 mg/dL — AB (ref 8.9–10.3)
CHLORIDE: 91 mmol/L — AB (ref 101–111)
CO2: 34 mmol/L — AB (ref 22–32)
CREATININE: 1.73 mg/dL — AB (ref 0.44–1.00)
GFR calc non Af Amer: 28 mL/min — ABNORMAL LOW (ref >60)
GFR, EST AFRICAN AMERICAN: 33 mL/min — AB (ref >60)
GLUCOSE: 114 mg/dL — AB (ref 65–99)
Potassium: 4.2 mmol/L (ref 3.5–5.1)
Sodium: 135 mmol/L (ref 135–145)

## 2016-01-09 LAB — MAGNESIUM: Magnesium: 1.8 mg/dL (ref 1.7–2.4)

## 2016-01-09 MED ORDER — TORSEMIDE 20 MG PO TABS: 60.0000 mg | ORAL_TABLET | Freq: Every day | ORAL | Status: DC

## 2016-01-09 MED ORDER — MAGNESIUM SULFATE 2 GM/50ML IV SOLN
2.0000 g | Freq: Once | INTRAVENOUS | Status: AC
  Administered 2016-01-09: 2 g via INTRAVENOUS
  Filled 2016-01-09 (×2): qty 50

## 2016-01-09 MED ORDER — POTASSIUM CHLORIDE CRYS ER 20 MEQ PO TBCR: 60.0000 meq | EXTENDED_RELEASE_TABLET | Freq: Every day | ORAL | Status: DC

## 2016-01-09 MED ORDER — ENOXAPARIN SODIUM 30 MG/0.3ML ~~LOC~~ SOLN: 30.0000 mg | SUBCUTANEOUS | Status: DC

## 2016-01-09 NOTE — Discharge Summary (Signed)
Advanced Heart Failure Discharge Note   Discharge Summary   Patient ID: [name] MRN: [id number], DOB/AGE: 1941-06-30 75 y.o. Admit date: 01/04/2016 D/C date:     01/09/2016   Primary Discharge Diagnoses:  1. Acute on chronic diastolic HF 2. Afib, Chronic 3. Tricuspid regurgitation 4. Mitral Valve disease 5. Hx of VT 6. Deconditioning - HHPT ordered 7. Hypokalemia, overcorrected to hyperkalemia - Now resolved.    Hospital Course:  Ms. Amante is a 75 y.o. 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 Status post bioprosthetic mitral valve replacement, tricuspid valve repair, and Maze procedure on September 24, 2011 (pre-op cath minimal CAD). Not on coumadin due to falls. 03/31/13 S/P DDD ICD. She was admitted from clinic 01/04/16 with volume overload in setting of dietary noncompliance. Admitted for IV diuresis.   She responded well to IV lasix.  Overall she diuresed 12.7 L and down 26 lbs (from clinic weight of 175) with IV lasix gtt 15 mg/hr. Stopped on 01/07/16 and transitioned to po torsemide starting 01/08/16.   Hospital course complicated by hyperkalemia at 5.6 on 01/08/16 after lasix drip stopped 01/07/16. Pt was held overnight and potassium level normalized 01/09/16 with holding of spiro and K supp with a K of 4.2. She will go home on her previous dose of torsemide and K supp. Will hold spiro for now and reassess with follow up BMET next week (She was NOT on spiro at home previously). Reinforced need for daily weights and reviewed use of sliding scale diuretics.  She will be discharged to home in stable condition with [facility] and PT. Stressed the importance of keeping her follow up next week, or calling at her earliest possible convenience if she needs to reschedule.  She has close HF follow up next week as below.   Discharge Weight Range: 139  lbs Discharge Vitals: Blood pressure 105/43, pulse 72, temperature 97.8 F (36.6 C), temperature source Oral, resp. rate 18, height 5\' 2"  (1.575 m), weight 139 lb 14.4 oz (63.458 kg), SpO2 100 %.  Labs: Lab Results  Component Value Date   WBC 7.7 01/05/2016   HGB 9.3* 01/05/2016   HCT 29.4* 01/05/2016   MCV 88.3 01/05/2016   PLT 172 01/05/2016    Recent Labs Lab 01/04/16 1218  01/09/16 0643  NA 139  < > 135  K 4.3  < > 4.2  CL 101  < > 91*  CO2 27  < > 34*  BUN 20  < > 22*  CREATININE 1.31*  < > 1.73*  CALCIUM 9.1  < > 10.9*  PROT 7.3  --   --   BILITOT 1.1  --   --   ALKPHOS 85  --   --   ALT 10*  --   --   AST 23  --   --   GLUCOSE 89  < > 114*  < > = values in this interval not displayed. Lab Results  Component Value Date   CHOL 103 05/03/2015   HDL 21* 05/03/2015   LDLCALC 69 05/03/2015   TRIG 67 05/03/2015   BNP (last 3 results)  Recent Labs  03/28/15 1600 05/03/15 0432 01/04/16 1219  BNP 220.7* 191.9* 227.3*    ProBNP (last 3 results) No results for input(s): PROBNP in the last 8760 hours.   Diagnostic Studies/Procedures   No results found.  Discharge  Medications     Medication List    TAKE these medications        albuterol 108 (90 Base) MCG/ACT inhaler  Commonly known as:  PROVENTIL HFA;VENTOLIN HFA  Inhale 1 puff into the lungs every 6 (six) hours as needed for wheezing or shortness of breath.     aspirin EC 81 MG tablet  Take 81 mg by mouth every evening.     cholecalciferol 1000 units tablet  Commonly known as:  VITAMIN D  Take 1,000 Units by mouth every morning.     cyanocobalamin 1000 MCG/ML injection  Commonly known as:  (VITAMIN B-12)  Inject 1,000 mcg into the muscle every 30 (thirty) days. Takes on 23rd or 24th each month     ferrous sulfate 325 (65 FE) MG tablet  Take 1 tablet (325 mg total) by mouth daily with breakfast.     folic acid 1 MG tablet  Commonly known as:  FOLVITE  Take 1 mg by mouth 2 (two) times daily.  Reported on 10/30/2015     levothyroxine 137 MCG tablet  Commonly known as:  SYNTHROID, LEVOTHROID  Take 1 tablet by mouth daily.     lisinopril 5 MG tablet  Commonly known as:  PRINIVIL,ZESTRIL  Take 2.5 mg by mouth daily.     LORazepam 1 MG tablet  Commonly known as:  ATIVAN  Take 2 mg by mouth at bedtime.     Melatonin 5 MG Tabs  Take 10 mg by mouth at bedtime.     metoprolol tartrate 25 MG tablet  Commonly known as:  LOPRESSOR  Take 25 mg by mouth 2 (two) times daily.     mirtazapine 15 MG tablet  Commonly known as:  REMERON  Take 15 mg by mouth at bedtime.     nitroGLYCERIN 0.4 MG SL tablet  Commonly known as:  NITROSTAT  Place 0.4 mg under the tongue every 5 (five) minutes as needed for chest pain. Reported on 01/04/2016     oxyCODONE-acetaminophen 7.5-325 MG tablet  Commonly known as:  PERCOCET  Take 1 tablet by mouth every 4 (four) hours as needed for moderate pain.     pantoprazole 40 MG tablet  Commonly known as:  PROTONIX  Take 40 mg by mouth daily as needed (for heartburn).     potassium chloride SA 20 MEQ tablet  Commonly known as:  K-DUR,KLOR-CON  Take 3 tablets (60 mEq total) by mouth daily.     pramipexole 0.25 MG tablet  Commonly known as:  MIRAPEX  Take 0.25 mg by mouth. Take 1 tablet by mouth at 4 PM and 1 at bedtime.     rosuvastatin 20 MG tablet  Commonly known as:  CRESTOR  Take 1 tablet (20 mg total) by mouth daily at 6 PM.     sucralfate 1 g tablet  Commonly known as:  CARAFATE  Take 1 g by mouth 4 (four) times daily -  with meals and at bedtime.     temazepam 30 MG capsule  Commonly known as:  RESTORIL  Take 1 capsule by mouth at bedtime.     torsemide 20 MG tablet  Commonly known as:  DEMADEX  Take 3 tablets (60 mg total) by mouth daily.        Disposition   The patient will be discharged in stable condition to home. Discharge Instructions    Diet - low sodium heart healthy    Complete by:  As directed  Heart Failure  patients record your daily weight using the same scale at the same time of day    Complete by:  As directed      Increase activity slowly    Complete by:  As directed           Follow-up Information    Follow up with [facility] On 01/18/2016.   Specialty:  Cardiology   Why:  at 1000 for post hospital follow up. Please bring all of your medications to your visit. The code for patient parking is Comptroller information:   [address] [id number] [place] [phone]      Follow up with [facility].   Why:  They will do your home health care at your home   Contact information:   tele # [phone]        Duration of Discharge Encounter: Greater than 35 minutes   Signed, Shirley Friar PA-C 01/09/2016, 11:02 AM  .Patient seen and examined with Oda Kilts, PA-C. We discussed all aspects of the encounter. I agree with the assessment and plan as stated above. I have edited not with my changes.  Bensimhon, Daniel,MD 12:39 PM

## 2016-01-09 NOTE — Progress Notes (Signed)
Advanced Heart Failure Rounding Note  PCP: Dr Jerolyn Center EP: Dr Lovena Le HF: Dr. Haroldine Laws   Subjective:    Feeling better overall. Breathing much improved. Out 12.8 L and down 29 lbs so far. Switched back to torsemide 01/08/16 with elevated creatinine over weekend.   K 5.6 -> 4.2.   Objective:   Weight Range: 139 lb 14.4 oz (63.458 kg) Body mass index is 25.58 kg/(m^2).   Vital Signs:   Temp:  [97.8 F (36.6 C)-99.1 F (37.3 C)] 97.8 F (36.6 C) (02/28 0634) Pulse Rate:  [69-72] 72 (02/28 0634) Resp:  [16-18] 18 (02/28 0634) BP: (103-112)/(43-56) 105/43 mmHg (02/28 0634) SpO2:  [92 %-100 %] 100 % (02/28 0634) Weight:  [139 lb 14.4 oz (63.458 kg)] 139 lb 14.4 oz (63.458 kg) (02/28 0634) Last BM Date: 01/08/16  Weight change: Filed Weights   01/07/16 0559 01/08/16 0603 01/09/16 0634  Weight: 151 lb 8 oz (68.72 kg) 145 lb 15.1 oz (66.2 kg) 139 lb 14.4 oz (63.458 kg)    Intake/Output:   Intake/Output Summary (Last 24 hours) at 01/09/16 [id number] Last data filed at 01/09/16 0617  Gross per 24 hour  Intake    600 ml  Output   1875 ml  Net  -1275 ml     Physical Exam: General: Elderly and chronically ill appearing.NAD HEENT: normal Neck: supple. JVP 7-8. Carotids 2+ bilaterally; no bruits. No thyromegaly or nodule noted.  Cor: PMI normal. RRR. No rubs, gallops. 3/6 HSM LLSB.  Lungs: Clear, no resp distress. Abdomen: soft, obese, NT, ND, no HSM. No bruits or masses. +BS  Extremities: trace ankle edema Cellulitic changes bilaterally.  Neuro: alert & orientedx3, cranial nerves grossly intact. Moves all 4 extremities w/o difficulty. Affect pleasant.   Telemetry: Reviewed personally, A paced 70s  Labs: CBC No results for input(s): WBC, NEUTROABS, HGB, HCT, MCV, PLT in the last 72 hours. Basic Metabolic Panel  Recent Labs  01/08/16 0432 01/09/16 0643  NA 134* 135  K 5.6* 4.2  CL 90* 91*  CO2 33* 34*  GLUCOSE 103* 114*  BUN 18 22*  CREATININE 1.43* 1.73*    CALCIUM 10.6* 10.9*  MG 1.7 1.8   Liver Function Tests No results for input(s): AST, ALT, ALKPHOS, BILITOT, PROT, ALBUMIN in the last 72 hours. No results for input(s): LIPASE, AMYLASE in the last 72 hours. Cardiac Enzymes No results for input(s): CKTOTAL, CKMB, CKMBINDEX, TROPONINI in the last 72 hours.  BNP: BNP (last 3 results)  Recent Labs  03/28/15 1600 05/03/15 0432 01/04/16 1219  BNP 220.7* 191.9* 227.3*    ProBNP (last 3 results) No results for input(s): PROBNP in the last 8760 hours.   D-Dimer No results for input(s): DDIMER in the last 72 hours. Hemoglobin A1C No results for input(s): HGBA1C in the last 72 hours. Fasting Lipid Panel No results for input(s): CHOL, HDL, LDLCALC, TRIG, CHOLHDL, LDLDIRECT in the last 72 hours. Thyroid Function Tests No results for input(s): TSH, T4TOTAL, T3FREE, THYROIDAB in the last 72 hours.  Invalid input(s): FREET3 Imaging/Studies:  No results found.  Latest Echo  Latest Cath   Medications:     Scheduled Medications: . antiseptic oral rinse  7 mL Mouth Rinse BID  . aspirin EC  81 mg Oral QPM  . cholecalciferol  1,000 Units Oral Daily  . enoxaparin (LOVENOX) injection  40 mg Subcutaneous Q24H  . ferrous sulfate  325 mg Oral Q breakfast  . folic acid  1 mg Oral BID  .  levothyroxine  137 mcg Oral QAC breakfast  . lisinopril  2.5 mg Oral Daily  . LORazepam  2 mg Oral QHS  . metoprolol tartrate  25 mg Oral BID  . mirtazapine  15 mg Oral QHS  . pramipexole  0.25 mg Oral 2 times per day  . rosuvastatin  20 mg Oral q1800  . sodium chloride flush  3 mL Intravenous Q12H  . sucralfate  1 g Oral TID WC & HS  . torsemide  60 mg Oral Daily    Infusions:    PRN Medications: sodium chloride, acetaminophen, albuterol, nitroGLYCERIN, oxyCODONE-acetaminophen, pantoprazole, sodium chloride flush   Assessment   1) Acute on chronic diastolic HF 2) Chest Pain 3) Atrial Fibrillation 4) Tricuspid regurgitation 5)  Mitral valve disease 6) Hx of VT 7) Deconditioning 8) Hypokalemia  Plan    Admitted 123456 with A/C diastolic HF.   Thought to be 20-30 lbs up in setting of non-compliance with HF team visits.   She has a tendency to go into VT with electrolyte imbalances.  K improved 5.6 -> 4.2 with holding potassium and Spiro. Currently in NSR by tele.  Repeat Echo 01/05/16 EF 50-55%, normally functioning MVR  PT following. Recommend HH PT.   Weight is below previous baseline and creatinine has bumped slightly. Continue po torsemide.    Will send out on previous home meds with close follow up next week.  OK for home today.  May have to wait for transportation.   Length of Stay: 5  Shirley Friar PA-C 01/09/2016, 8:22 AM  Advanced Heart Failure Team Pager [phone] (M-F; 7a - 4p)  Please contact [facility] for night-coverage after hours (4p -7a ) and weekends on [email or web address]  Patient seen and examined with Oda Kilts, PA-C. We discussed all aspects of the encounter. I agree with the assessment and plan as stated above.   Volume status much improved. Can go home today. Renal function and potassium ok.   Joslynn Jamroz,MD 4:12 PM

## 2016-01-09 NOTE — Consult Note (Addendum)
WOC wound consult note Reason for Consult: Refer to previous progress note on 2/24. Dressing change with bilat 4 layer Profore compression wraps. Dressing procedure/placement/frequency: Pt plans to discharge today and should resume home health with Profore changed Q Tues/Fri. Removed previous wraps and blisters which were noted during the last consult have resolved, there are no further open wounds or weeping. WOC applied 4 layer compression wraps.Change compression wraps 2x wk Tues/Friday; Will need to continue [facility] for compression wrap changes 2x wk and to monitor for any new ulcers and/or improvement of current.  Please re-consult if further assistance is needed.  Thank-you,  Julien Girt MSN, [place], [place], [place], [place]

## 2016-01-09 NOTE — Progress Notes (Signed)
Talked to patient's spouse concerning dc home. Spouse is in agreement for ambulance transportation home. [facility] Worker working on arrangements. Mindi Slicker [facility] [phone]

## 2016-01-09 NOTE — Progress Notes (Signed)
SW spoke with pt's spouse who confirmed home address for transport correct ([address] [place]). Will arrange [facility] for transport. No d/c order at this time.   Wandra Feinstein, MSW, [facility] [phone]

## 2016-01-09 NOTE — Care Management Note (Signed)
Case Management Note  Patient Details  Name: [name] MRN: [id number] Date of Birth: [birth year]  Subjective/Objective:      Admitted with CHF              Action/Plan: Patient lives at home with spouse, was active with [place] for [facility] services and request to use them again. She has a Sales executive at home that she use when she goes out, home oxygen. Arville Go called and orders faxed as requested. [facility] Worker will provide for ambulance transportation home at discharge.  Expected Discharge Date:  01/09/16               Expected Discharge Plan:  [place]  In-House Referral:  NA  Discharge planning Services  CM Consult  Post Acute Care Choice:    Choice offered to:  Patient  HH Arranged:  RN, Disease Management, PT HH Agency:  [facility]  Status of Service:  In process, will continue to follow  Medicare Important Message Given:  Yes  Sherrilyn Rist [phone] 01/09/2016, 10:26 AM

## 2016-01-18 ENCOUNTER — Inpatient Hospital Stay (HOSPITAL_COMMUNITY): Admission: RE | Admit: 2016-01-18 | Payer: Medicare Other | Source: Ambulatory Visit

## 2016-04-29 ENCOUNTER — Inpatient Hospital Stay (HOSPITAL_COMMUNITY): Payer: Medicare Other

## 2016-04-29 ENCOUNTER — Inpatient Hospital Stay (HOSPITAL_COMMUNITY)
Admission: AD | Admit: 2016-04-29 | Discharge: 2016-05-11 | DRG: 208 | Disposition: E | Payer: Medicare Other | Source: Other Acute Inpatient Hospital | Attending: Pulmonary Disease | Admitting: Pulmonary Disease

## 2016-04-29 DIAGNOSIS — J80 Acute respiratory distress syndrome: Secondary | ICD-10-CM

## 2016-04-29 DIAGNOSIS — D649 Anemia, unspecified: Secondary | ICD-10-CM | POA: Diagnosis present

## 2016-04-29 DIAGNOSIS — Z881 Allergy status to other antibiotic agents status: Secondary | ICD-10-CM

## 2016-04-29 DIAGNOSIS — Z952 Presence of prosthetic heart valve: Secondary | ICD-10-CM | POA: Diagnosis not present

## 2016-04-29 DIAGNOSIS — R570 Cardiogenic shock: Secondary | ICD-10-CM | POA: Diagnosis present

## 2016-04-29 DIAGNOSIS — E872 Acidosis: Secondary | ICD-10-CM | POA: Diagnosis present

## 2016-04-29 DIAGNOSIS — R296 Repeated falls: Secondary | ICD-10-CM | POA: Diagnosis present

## 2016-04-29 DIAGNOSIS — A047 Enterocolitis due to Clostridium difficile: Secondary | ICD-10-CM | POA: Diagnosis present

## 2016-04-29 DIAGNOSIS — E039 Hypothyroidism, unspecified: Secondary | ICD-10-CM | POA: Diagnosis present

## 2016-04-29 DIAGNOSIS — J9600 Acute respiratory failure, unspecified whether with hypoxia or hypercapnia: Secondary | ICD-10-CM

## 2016-04-29 DIAGNOSIS — Z888 Allergy status to other drugs, medicaments and biological substances status: Secondary | ICD-10-CM

## 2016-04-29 DIAGNOSIS — Z9010 Acquired absence of unspecified breast and nipple: Secondary | ICD-10-CM

## 2016-04-29 DIAGNOSIS — Z7982 Long term (current) use of aspirin: Secondary | ICD-10-CM | POA: Diagnosis not present

## 2016-04-29 DIAGNOSIS — I2781 Cor pulmonale (chronic): Secondary | ICD-10-CM | POA: Diagnosis present

## 2016-04-29 DIAGNOSIS — Z515 Encounter for palliative care: Secondary | ICD-10-CM | POA: Diagnosis not present

## 2016-04-29 DIAGNOSIS — Z903 Acquired absence of stomach [part of]: Secondary | ICD-10-CM

## 2016-04-29 DIAGNOSIS — I509 Heart failure, unspecified: Secondary | ICD-10-CM | POA: Diagnosis present

## 2016-04-29 DIAGNOSIS — E1122 Type 2 diabetes mellitus with diabetic chronic kidney disease: Secondary | ICD-10-CM | POA: Diagnosis present

## 2016-04-29 DIAGNOSIS — N179 Acute kidney failure, unspecified: Secondary | ICD-10-CM | POA: Diagnosis present

## 2016-04-29 DIAGNOSIS — G9340 Encephalopathy, unspecified: Secondary | ICD-10-CM | POA: Diagnosis present

## 2016-04-29 DIAGNOSIS — R34 Anuria and oliguria: Secondary | ICD-10-CM | POA: Diagnosis present

## 2016-04-29 DIAGNOSIS — I4891 Unspecified atrial fibrillation: Secondary | ICD-10-CM | POA: Diagnosis present

## 2016-04-29 DIAGNOSIS — Z8711 Personal history of peptic ulcer disease: Secondary | ICD-10-CM | POA: Diagnosis not present

## 2016-04-29 DIAGNOSIS — Z66 Do not resuscitate: Secondary | ICD-10-CM | POA: Diagnosis present

## 2016-04-29 DIAGNOSIS — J9601 Acute respiratory failure with hypoxia: Principal | ICD-10-CM | POA: Diagnosis present

## 2016-04-29 DIAGNOSIS — Z79899 Other long term (current) drug therapy: Secondary | ICD-10-CM

## 2016-04-29 DIAGNOSIS — I130 Hypertensive heart and chronic kidney disease with heart failure and stage 1 through stage 4 chronic kidney disease, or unspecified chronic kidney disease: Secondary | ICD-10-CM | POA: Diagnosis present

## 2016-04-29 DIAGNOSIS — Z8673 Personal history of transient ischemic attack (TIA), and cerebral infarction without residual deficits: Secondary | ICD-10-CM | POA: Diagnosis not present

## 2016-04-29 DIAGNOSIS — Z885 Allergy status to narcotic agent status: Secondary | ICD-10-CM | POA: Diagnosis not present

## 2016-04-29 DIAGNOSIS — J45909 Unspecified asthma, uncomplicated: Secondary | ICD-10-CM | POA: Diagnosis present

## 2016-04-29 DIAGNOSIS — N183 Chronic kidney disease, stage 3 (moderate): Secondary | ICD-10-CM | POA: Diagnosis present

## 2016-04-29 LAB — COMPREHENSIVE METABOLIC PANEL
ALT: 125 U/L — ABNORMAL HIGH (ref 14–54)
ANION GAP: 15 (ref 5–15)
AST: 855 U/L — ABNORMAL HIGH (ref 15–41)
Albumin: 2.6 g/dL — ABNORMAL LOW (ref 3.5–5.0)
Alkaline Phosphatase: 142 U/L — ABNORMAL HIGH (ref 38–126)
BUN: 41 mg/dL — ABNORMAL HIGH (ref 6–20)
CHLORIDE: 100 mmol/L — AB (ref 101–111)
CO2: 16 mmol/L — AB (ref 22–32)
CREATININE: 2.76 mg/dL — AB (ref 0.44–1.00)
Calcium: 7.8 mg/dL — ABNORMAL LOW (ref 8.9–10.3)
GFR, EST AFRICAN AMERICAN: 19 mL/min — AB (ref >60)
GFR, EST NON AFRICAN AMERICAN: 16 mL/min — AB (ref >60)
Glucose, Bld: 112 mg/dL — ABNORMAL HIGH (ref 65–99)
POTASSIUM: 3.9 mmol/L (ref 3.5–5.1)
Sodium: 131 mmol/L — ABNORMAL LOW (ref 135–145)
Total Bilirubin: 2.2 mg/dL — ABNORMAL HIGH (ref 0.3–1.2)
Total Protein: 6 g/dL — ABNORMAL LOW (ref 6.5–8.1)

## 2016-04-29 LAB — GLUCOSE, CAPILLARY: GLUCOSE-CAPILLARY: 90 mg/dL (ref 65–99)

## 2016-04-29 LAB — PROCALCITONIN: PROCALCITONIN: 2.97 ng/mL

## 2016-04-29 LAB — MAGNESIUM: MAGNESIUM: 1.7 mg/dL (ref 1.7–2.4)

## 2016-04-29 LAB — TROPONIN I: TROPONIN I: 0.66 ng/mL — AB (ref <0.031)

## 2016-04-29 LAB — CORTISOL: CORTISOL PLASMA: 28.4 ug/dL

## 2016-04-29 LAB — MRSA PCR SCREENING: MRSA by PCR: NEGATIVE

## 2016-04-29 LAB — LACTIC ACID, PLASMA: LACTIC ACID, VENOUS: 8.2 mmol/L — AB (ref 0.5–2.0)

## 2016-04-29 LAB — PHOSPHORUS: PHOSPHORUS: 5.3 mg/dL — AB (ref 2.5–4.6)

## 2016-04-29 MED ORDER — NOREPINEPHRINE BITARTRATE 1 MG/ML IV SOLN
0.0000 ug/min | INTRAVENOUS | Status: DC
  Filled 2016-04-29: qty 4

## 2016-04-29 MED ORDER — PHENYLEPHRINE HCL 10 MG/ML IJ SOLN
0.0000 ug/min | INTRAVENOUS | Status: DC
  Administered 2016-04-29 (×3): 200 ug/min via INTRAVENOUS
  Filled 2016-04-29 (×4): qty 4

## 2016-04-29 MED ORDER — CHLORHEXIDINE GLUCONATE 0.12% ORAL RINSE (MEDLINE KIT)
15.0000 mL | Freq: Two times a day (BID) | OROMUCOSAL | Status: DC
  Administered 2016-04-29: 15 mL via OROMUCOSAL

## 2016-04-29 MED ORDER — SODIUM CHLORIDE 0.9 % IV SOLN
1.0000 mg/h | INTRAVENOUS | Status: DC
  Administered 2016-04-29: 2 mg/h via INTRAVENOUS
  Filled 2016-04-29: qty 10

## 2016-04-29 MED ORDER — FENTANYL CITRATE (PF) 100 MCG/2ML IJ SOLN: 50.0000 ug | INTRAMUSCULAR | Status: DC | PRN

## 2016-04-29 MED ORDER — INSULIN ASPART 100 UNIT/ML ~~LOC~~ SOLN: 0.0000 [IU] | SUBCUTANEOUS | Status: DC

## 2016-04-29 MED ORDER — VANCOMYCIN 50 MG/ML ORAL SOLUTION
125.0000 mg | Freq: Four times a day (QID) | ORAL | Status: DC
  Administered 2016-04-29 – 2016-04-30 (×2): 125 mg via ORAL
  Filled 2016-04-29 (×3): qty 2.5

## 2016-04-29 MED ORDER — DOCUSATE SODIUM 50 MG/5ML PO LIQD: 100.0000 mg | Freq: Two times a day (BID) | ORAL | Status: DC | PRN

## 2016-04-29 MED ORDER — SODIUM CHLORIDE 0.9 % IV SOLN: 250.0000 mL | INTRAVENOUS | Status: DC | PRN

## 2016-04-29 MED ORDER — HEPARIN SODIUM (PORCINE) 5000 UNIT/ML IJ SOLN
5000.0000 [IU] | Freq: Three times a day (TID) | INTRAMUSCULAR | Status: DC
  Administered 2016-04-29: 5000 [IU] via SUBCUTANEOUS
  Filled 2016-04-29 (×2): qty 1

## 2016-04-29 MED ORDER — DEXTROSE 5 % IV SOLN
0.0000 ug/min | INTRAVENOUS | Status: DC
  Filled 2016-04-29: qty 1

## 2016-04-29 MED ORDER — NOREPINEPHRINE BITARTRATE 1 MG/ML IV SOLN
0.0000 ug/min | INTRAVENOUS | Status: DC
  Administered 2016-04-29: 25 ug/min via INTRAVENOUS
  Administered 2016-04-30: 30 ug/min via INTRAVENOUS
  Filled 2016-04-29 (×2): qty 16

## 2016-04-29 MED ORDER — LEVOTHYROXINE SODIUM 100 MCG IV SOLR: 75.0000 ug | Freq: Every day | INTRAVENOUS | Status: DC

## 2016-04-29 MED ORDER — ANTISEPTIC ORAL RINSE SOLUTION (CORINZ)
7.0000 mL | OROMUCOSAL | Status: DC
  Administered 2016-04-29 (×3): 7 mL via OROMUCOSAL

## 2016-04-29 NOTE — H&P (Signed)
PULMONARY / CRITICAL CARE MEDICINE   Name: [name] MRN: [id number] DOB: Apr 23, 1941    ADMISSION DATE:  04/24/2016 CONSULTATION DATE:  04/22/2016  [facility]ING [facility]:  [facility]  CHIEF COMPLAINT:  Acute Respiratory Failure, Shock  HISTORY OF PRESENT ILLNESS:   [name] is a 75 y.o. female w/ PMHx of TR s/p repair, MR s/p replacement, PAH, HTN, h/o CVA s/ left-sided residual weakness, DM type II, Asthma, PUD s/p partial gastrectomy h/o atrial fibrillation s/p MAZE procedure (not on AC due to multiple falls), CKD, presented from [facility] with acute respiratory failure s/p intubation at [facility] and suspected septic shock. Patient with recent diagnosis of C. Diff Colitis, started on Vanc po on 6/15. Transferred to [facility] for further management. Likely cardiogenic shock.   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  PAST SURGICAL HISTORY: She  has past surgical history that includes Partial gastrectomy; Tonsillectomy; Mastectomy (2008); MAZE (09/24/2011); Mitral valve replacement (09/24/2011); Tricuspid valvuloplasty (09/24/2011); left and right heart catheterization with coronary angiogram (N/A, 03/30/2013); implantable cardioverter defibrillator implant (N/A, 03/31/2013); and Cardiac valve replacement.  Allergies  Allergen  Reactions  . Butorphanol Tartrate Other (See Comments)    REACTION: migraines, nervous/agitated  . Levofloxacin Other (See Comments)    Avoid QTc prolonging medications  . Zofran [Ondansetron Hcl] Other (See Comments)    Avoid QTc prolonging medications  . Codeine Nausea And Vomiting  . Lipitor [Atorvastatin Calcium] Diarrhea and Other (See Comments)    nightmares  . Revatio [Sildenafil Citrate] Swelling  . Acyclovir And Related   . Keflex [Cephalexin] Other (See Comments)    Listed as allergy at [facility], but tolerated cefepime here.    No current facility-administered medications on file prior to encounter.   Current Outpatient Prescriptions on File Prior to Encounter  Medication Sig  . albuterol (PROVENTIL HFA;VENTOLIN HFA) 108 (90 BASE) MCG/ACT inhaler Inhale 1 puff into the lungs every 6 (six) hours as needed for wheezing or shortness of breath.  Marland Kitchen aspirin EC 81 MG tablet Take 81 mg by mouth every evening.  . cholecalciferol (VITAMIN D) 1000 UNITS tablet Take 1,000 Units by mouth every morning.   . cyanocobalamin (,VITAMIN B-12,) 1000 MCG/ML injection Inject 1,000 mcg into the muscle every 30 (thirty) days. Takes on 23rd or 24th each month  . ferrous sulfate 325 (65 FE) MG tablet Take 1 tablet (325 mg total) by mouth daily with breakfast.  . folic acid (FOLVITE) 1 MG tablet Take 1 mg by mouth 2 (two) times daily. Reported on 10/30/2015  . levothyroxine (SYNTHROID, LEVOTHROID) 137 MCG tablet Take 1 tablet by mouth daily.  Marland Kitchen lisinopril (PRINIVIL,ZESTRIL) 5 MG tablet Take 2.5 mg by mouth daily.  Marland Kitchen LORazepam (ATIVAN) 1 MG tablet Take 2 mg by mouth at bedtime.   . Melatonin 5 MG TABS Take 10 mg by mouth at bedtime.  . metoprolol tartrate (LOPRESSOR) 25 MG tablet Take 25 mg by mouth 2 (two) times daily.  . mirtazapine (REMERON) 15 MG tablet Take 15 mg by mouth  at bedtime.   . nitroGLYCERIN (NITROSTAT) 0.4 MG SL tablet Place 0.4 mg under the tongue every 5 (five) minutes as needed  for chest pain. Reported on 01/04/2016  . oxyCODONE-acetaminophen (PERCOCET) 7.5-325 MG per tablet Take 1 tablet by mouth every 4 (four) hours as needed for moderate pain.   . pantoprazole (PROTONIX) 40 MG tablet Take 40 mg by mouth daily as needed (for heartburn).  . potassium chloride SA (K-DUR,KLOR-CON) 20 MEQ tablet Take 3 tablets (60 mEq total) by mouth daily.  . pramipexole (MIRAPEX) 0.25 MG tablet Take 0.25 mg by mouth. Take 1 tablet by mouth at 4 PM and 1 at bedtime.  . rosuvastatin (CRESTOR) 20 MG tablet Take 1 tablet (20 mg total) by mouth daily at 6 PM.  . sucralfate (CARAFATE) 1 G tablet Take 1 g by mouth 4 (four) times daily -  with meals and at bedtime.  . temazepam (RESTORIL) 30 MG capsule Take 1 capsule by mouth at bedtime.  . torsemide (DEMADEX) 20 MG tablet Take 3 tablets (60 mg total) by mouth daily.    FAMILY HISTORY:  Her indicated that her mother is deceased. She indicated that her father is deceased. She indicated that her sister is alive. She indicated that both of her brothers are alive.   SOCIAL HISTORY: She  reports that she has never smoked. She has never used smokeless tobacco. She reports that she does not drink alcohol or use illicit drugs.  REVIEW OF SYSTEMS:    Unable to obtain due to intubation/respiratory failure  SUBJECTIVE:  Intubated, not following commands. Tachycardic, severe anasarca. Cool extremities, cardiogenic shock.   VITAL SIGNS: BP 69/53 mmHg  Pulse 47  Temp(Src) 99.5 F (37.5 C) (Oral)  Resp 26  SpO2 100%  HEMODYNAMICS:    VENTILATOR SETTINGS: Vent Mode:  [-] PRVC FiO2 (%):  [100 %] 100 % Set Rate:  [20 bmp] 20 bmp Vt Set:  [400 mL] 400 mL PEEP:  [10 cmH20] 10 cmH20 Plateau Pressure:  [29 cmH20] 29 cmH20  INTAKE / OUTPUT:    PHYSICAL EXAMINATION: General:  Intubated, obtunded. Not following commands.  Neuro:  RASS -2. Obtunded.  HEENT:  PERRL. Moist mucus membranes. ETT/OGT in place.  Cardiovascular:  Tachycardic,  irregular. No murmurs.  Lungs:  Coarse breath sounds throughout. No wheezes.  Abdomen:  Distended. BS + Musculoskeletal:  Anasarca. Cool extremities.  Skin:  Edematous, no obvious rashes.   LABS:  BMET No results for input(s): NA, K, CL, CO2, BUN, CREATININE, GLUCOSE in the last 168 hours.  Electrolytes No results for input(s): CALCIUM, MG, PHOS in the last 168 hours.  CBC No results for input(s): WBC, HGB, HCT, PLT in the last 168 hours.  Coag's No results for input(s): APTT, INR in the last 168 hours.  Sepsis Markers No results for input(s): LATICACIDVEN, PROCALCITON, O2SATVEN in the last 168 hours.  ABG No results for input(s): PHART, PCO2ART, PO2ART in the last 168 hours.  Liver Enzymes No results for input(s): AST, ALT, ALKPHOS, BILITOT, ALBUMIN in the last 168 hours.  Cardiac Enzymes No results for input(s): TROPONINI, PROBNP in the last 168 hours.  Glucose No results for input(s): GLUCAP in the last 168 hours.  Imaging No results found.   STUDIES:  ECHO pending CXR pending  CULTURES: 6/19 Blood >> 6/19 Urine >> 6/19 Resp >>  ANTIBIOTICS: Vanc po 6/15 ([facility]) >> Meropenem 6/17 ([facility]) >> 6/19  SIGNIFICANT EVENTS: 6/19 Admit  LINES/TUBES: RUE PICC 04/24/16 >>   DISCUSSION: 74  y/o F w/ multiple co-morbidities, intubated at [facility] for acute respiratory failure, sent to [facility] for suspected septic shock, most likely severe right-sided heart failure and cardiogenic shock.   ASSESSMENT / PLAN:  PULMONARY A: VDRF P:   Full vent support  CARDIOVASCULAR A:  Atrial Fibrillation w/ RVR Cardiogenic Shock  P:  Continue pressor support STAT ECHO Family discussion needed, patient with dismal prognosis  RENAL A:   Anuria 2/2 cardiorenal syndrome Metabolic acidosis P:   Check BMP  GASTROINTESTINAL A:   Recent C. Diff Colitis P:   Continue Vancomycin po for now NPO  HEMATOLOGIC A:   Leukocytosis Anemia P:  Check  CBC  INFECTIOUS A:   C. Diff Colitis Possible Septic Component P:   Follow up cultures from [facility] Repeat sputum, urine, blood cultures Stop Meropenem Continue Vancomycin po for now  ENDOCRINE A:   DM type II Hypothyroidism   P:   ISS q4h Synthroid IV 75 mcg daily  NEUROLOGIC A:   Acute Encephalopathy P:   RASS goal: -1 Fentanyl prn   FAMILY  - Updates: Family meeting planned per Dr. Nelda Marseille to discuss transition to comfort care. Made DNR at this time per discussion with daughter. Patient very severely ill with cardiogenic shock and dismal prognosis.     Natasha Bence, MD PGY-3, Internal Medicine Pager: [phone]  Attending Note:  75 year old female with extensive cardiac history who [facility] with cardiogenic refractory shock, acute renal failure, refractory hypoxemia and overall lack of response to any interventions.  On exam, she is completely unresponsive.  Profoundly hypotensive.  Aneuric.  I spoke with Dr. Missy Sabins from [facility] team.  He is very familiar with the patient and informs me that she would never want this level of care.  Spoke with husband who reports he had a stroke and has no short term memory and asked me to talk to daughter Manuela Schwartz.  Called susan, she does not want to make decision to withdrawal but is ok with no further escalation of care.  Will bring neo down to 200 and levo down to 30 and no further increase.  No further escalation of care and no HD.  Manuela Schwartz was also agreeable to DNR and low dose morphine incase patient is feeling any pain.  Will start morphine at 2 mg/hr drip, titrate only if patient is in distress.  The patient is critically ill with multiple organ systems failure and requires high complexity decision making for assessment and support, frequent evaluation and titration of therapies, application of advanced monitoring technologies and extensive interpretation of multiple databases.   Critical Care Time devoted to patient care  services described in this note is  45  Minutes. This time reflects time of care of this signee Dr Jennet Maduro. This critical care time does not reflect procedure time, or teaching time or supervisory time of PA/NP/Med student/Med Resident etc but could involve care discussion time.  Rush Farmer, M.D. [facility] Pulmonary/Critical Care Medicine. Pager: [phone]. After hours pager: [phone].

## 2016-04-29 NOTE — Progress Notes (Signed)
At 1618 critical troponin of 0.66 and lactic of 8.2 reported to [place] and Dr. Ardis Hughs.

## 2016-04-29 NOTE — Progress Notes (Signed)
Pt arrived via [place] from [place].  Pt placed on current settings per MD, pt tolerating well, will follow up with ABG X1hr, RT will monitor

## 2016-04-29 NOTE — Progress Notes (Signed)
Daughter [name] is on the way. She has approximately an 8 hour drive . ETA is 2300. MD verbalized neo and levo max limit of 200 and 30 respectively. [name] verbalized that she wanted her mother comfortable and does not want her pain to be prolonged, but at the same time she would like to see her before she passes. 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

## 2016-04-30 MED ORDER — MORPHINE BOLUS VIA INFUSION
5.0000 mg | INTRAVENOUS | Status: DC | PRN
  Filled 2016-04-30: qty 20

## 2016-04-30 MED ORDER — LORAZEPAM BOLUS VIA INFUSION
2.0000 mg | INTRAVENOUS | Status: DC | PRN
  Filled 2016-04-30: qty 5

## 2016-04-30 MED ORDER — MORPHINE SULFATE 25 MG/ML IV SOLN
1.0000 mg/h | INTRAVENOUS | Status: DC
  Filled 2016-04-30: qty 10

## 2016-04-30 MED ORDER — LORAZEPAM 2 MG/ML IJ SOLN
1.0000 mg/h | INTRAVENOUS | Status: DC
  Filled 2016-04-30: qty 25

## 2016-05-11 NOTE — Progress Notes (Signed)
Daughter [name] arrived from [place] at 23:45, family was at bedside including husband of patient and two grandchildren. Family left but daughter [name] stayed by bedside. At 2:00 daughter expressed concern for the pt and that she was ready to withdraw care. Father expressed that since [name] was there with her if she felt it would be best to with draw than she was able to make the decision. Family is aware the outcome of withdrawing care at this time.  Dr Halford Chessman was notified with [facility] and orders were put in to withdraw care.

## 2016-05-11 NOTE — Discharge Summary (Signed)
[name], [name] NO.:  [id number]  MEDICAL RECORD NO.:  [id number]  LOCATION:  2M06C                        FACILITY:  [facility]  PHYSICIAN:  Providence Lanius, MD  DATE OF BIRTH:  01-06-1941  DATE OF ADMISSION:  04/19/2016 DATE OF DISCHARGE:  05/08/2016                              DISCHARGE SUMMARY   DEATH SUMMARY.  PRIMARY DIAGNOSIS/CAUSE OF DEATH:  Cardiogenic shock.  SECONDARY DIAGNOSES:  Pulmonary edema, anasarca, acute respiratory failure, right heart failure, cor pulmonale, and acute renal failure.  The patient is a 75 year old female with extensive past medical history, who was residing at [facility] when she developed acute respiratory failure due to fluid overload.  She was intubated.  Family requested the patient be transferred to [facility].  Upon arrival to [facility], the patient was in refractory cardiogenic shock, on multiple pressors.  Systolic blood pressure in the 60s at which point I had conversation with family, informed that the patient is deteriorating rather rapidly and that there is very little that can be done for her at this point. At which point, the family informed that the patient would not want prolonged aggressive care. The patient was made DNR with the family arrival at bedside.  Upon arrival, we had further discussion informing that the patient is actively dying and comfort would be the most appropriate thing at this point.  The family requested that we proceed with comfort care at 2:30 a.m. on 2016-05-08, and withdrawal orders were placed.  The patient expired shortly thereafter.     Providence Lanius, MD     WJY/MEDQ  D:  05/01/2016  T:  05/01/2016  Job:  [id number]

## 2016-05-11 NOTE — Progress Notes (Addendum)
Pt apneic and pulseless. Family at bedside. MD made aware. TOD 3:08 verified no heart or lung sounds with second RN Julieanne Cotton.

## 2016-05-11 NOTE — Progress Notes (Signed)
225 of morphine wasted down the sink with second RN MaryBeth Hancock.

## 2016-05-11 NOTE — Progress Notes (Signed)
eLink Physician-Brief Progress Note Patient Name: [name] DOB: Nov 02, 1941 MRN: [id number]   Date of Service  05/11/16  HPI/Events of Note  Family has decided to proceed with comfort measures.   eICU Interventions  Withdrawal of support order set placed.     Intervention Category Major Interventions: Other:  Recia Sons 05-11-2016, 2:28 AM

## 2016-05-11 NOTE — Procedures (Signed)
Extubation Procedure Note  Patient Details:   Name: [name] DOB: 1941-06-20 MRN: [id number]   Airway Documentation:  Evaluation  O2 sats: currently acceptable Complications: No apparent complications Patient did tolerate procedure well. Bilateral Breath Sounds: Diminished   No  Patient terminally extubated per family request and MD order.  Chelbie Jarnagin, Elwyn Lade May 20, 2016, 2:50 AM

## 2016-05-11 DEATH — deceased

## 2016-05-16 ENCOUNTER — Telehealth: Payer: Self-pay

## 2016-05-16 NOTE — Telephone Encounter (Signed)
On 05/16/2016 I received a death certificate from [place] (original). The death certificate is for burial. The patient is a patient of Doctor Nelda Marseille. The death certificate will be taken to [facility]) this pm for signature. On 2016-06-09 I received the death certificate back from Doctor Nelda Marseille. I got the death certificate ready and called the funeral home to let them know the death certificate is ready for pckup.
